# Patient Record
Sex: Male | Born: 1975
Health system: Southern US, Community
[De-identification: ages and names within clinical notes are randomized; demographics above are authoritative.]

## PROBLEM LIST (undated history)

## (undated) DIAGNOSIS — M1990 Unspecified osteoarthritis, unspecified site: Secondary | ICD-10-CM

## (undated) DIAGNOSIS — K219 Gastro-esophageal reflux disease without esophagitis: Secondary | ICD-10-CM

## (undated) DIAGNOSIS — R0600 Dyspnea, unspecified: Secondary | ICD-10-CM

## (undated) DIAGNOSIS — K859 Acute pancreatitis without necrosis or infection, unspecified: Secondary | ICD-10-CM

## (undated) DIAGNOSIS — J939 Pneumothorax, unspecified: Secondary | ICD-10-CM

## (undated) DIAGNOSIS — R569 Unspecified convulsions: Secondary | ICD-10-CM

## (undated) DIAGNOSIS — R519 Headache, unspecified: Secondary | ICD-10-CM

## (undated) HISTORY — PX: APPENDECTOMY: SHX54

## (undated) HISTORY — PX: CHOLECYSTECTOMY: SHX55

## (undated) NOTE — *Deleted (*Deleted)
Neurology consult  CC: seizure  History is obtained from:***  HPI: Ronald Butler is a 47 y.o. male ***   ROS: A complete ROS was performed and is negative except as noted in the HPI. *** Unable to assess due to altered mental status.   Past Medical History:  Diagnosis Date  . Pancreatitis   . Pneumothorax   . Seizures (HCC)     Family History  Family history unknown: Yes    Social History:  reports that he has been smoking cigarettes. He has been smoking about 2.00 packs per day. He has never used smokeless tobacco. He reports previous alcohol use. He reports previous drug use. Drug: Marijuana.   Prior to Admission medications   Medication Sig Start Date End Date Taking? Authorizing Provider  divalproex (DEPAKOTE ER) 500 MG 24 hr tablet Take 1 tablet (500 mg total) by mouth daily. 04/23/20  Yes Ronald Bucco, MD    Exam: Current vital signs: BP 127/83   Pulse 68   Temp 99.4 F (37.4 C) (Oral)   Resp (!) 25   SpO2 100%   Physical Exam  Constitutional: Appears well-developed and well-nourished.  Psych: Affect appropriate to situation Eyes: No scleral injection HENT: No OP obstrucion Head: Normocephalic.  Cardiovascular: Normal rate and regular rhythm.  Respiratory: Effort normal and breath sounds normal to anterior ascultation GI: Soft.  No distension. There is no tenderness.  Skin: WDI  Neuro: Mental Status: Patient is awake, alert, oriented to person, place, month, year, and situation.*** Patient is able to give a clear and coherent history.*** No signs of aphasia or neglect*** Cranial Nerves: II: Visual Fields are full. Pupils are equal, round, and reactive to light. *** III,IV, VI: EOMI without ptosis or diploplia.  V: Facial sensation is symmetric to temperature VII: Facial movement is symmetric.  VIII: hearing is intact to voice X: Uvula elevates symmetrically XI: Shoulder shrug is symmetric. XII: tongue is midline without atrophy or fasciculations.   Motor: Tone is normal. Bulk is normal. 5/5 strength was present in all four extremities. *** Sensory: Sensation is symmetric to light touch and temperature in the arms and legs. *** Deep Tendon Reflexes: 2+ and symmetric in the biceps and patellae. *** Plantars: Toes are downgoing bilaterally. *** Cerebellar: FNF and HKS are intact bilaterally. ***   I have reviewed labs in epic and the pertinent results are: *** I have reviewed the images obtained: NCT head showed *** CTA head and neck showed ***  Assessment: Ronald Butler is a 67 y.o. male PMHx ***   Impression: ***  Plan: - Serum VPA levels Results for Ronald Butler (MRN [id number]) as of 04/26/2020 19:41  Ref. Range 04/23/2020 23:00 04/26/2020 17:32  Valproic Acid,S Latest Ref Range: 50.0 - 100.0 ug/mL <10 (L) 23 (L)    - rEEG to eval for any epileptogenic discharges. - Recommend metabolic/infectious workup with CBC, CMP, UA with UCx, CXR, CK, serum lactate.   This patient is critically ill and at significant risk of neurological worsening, death and care requires constant monitoring of vital signs, hemodynamics,respiratory and cardiac monitoring, neurological assessment, discussion with family, other specialists and medical decision making of high complexity. I spent *** minutes of neurocritical care time  in the care of  this patient. This was time spent independent of any time provided by nurse practitioner or PA.  Electronically signed by: Dr. Marisue Humble Pager: [phone] 04/26/2020, 7:39 PM

---

## 2010-11-02 ENCOUNTER — Emergency Department (HOSPITAL_BASED_OUTPATIENT_CLINIC_OR_DEPARTMENT_OTHER)
Admission: EM | Admit: 2010-11-02 | Discharge: 2010-11-02 | Disposition: A | Discharge status: Home / Self Care | Payer: Self-pay | Attending: Emergency Medicine | Admitting: Emergency Medicine

## 2010-11-02 ENCOUNTER — Emergency Department (INDEPENDENT_AMBULATORY_CARE_PROVIDER_SITE_OTHER): Payer: Self-pay

## 2010-11-02 DIAGNOSIS — W19XXXA Unspecified fall, initial encounter: Secondary | ICD-10-CM | POA: Insufficient documentation

## 2010-11-02 DIAGNOSIS — S20219A Contusion of unspecified front wall of thorax, initial encounter: Secondary | ICD-10-CM | POA: Insufficient documentation

## 2010-11-02 DIAGNOSIS — R079 Chest pain, unspecified: Secondary | ICD-10-CM | POA: Insufficient documentation

## 2010-11-02 DIAGNOSIS — Y92009 Unspecified place in unspecified non-institutional (private) residence as the place of occurrence of the external cause: Secondary | ICD-10-CM | POA: Insufficient documentation

## 2016-08-31 ENCOUNTER — Emergency Department (HOSPITAL_COMMUNITY)
Admission: EM | Admit: 2016-08-31 | Discharge: 2016-08-31 | Disposition: A | Discharge status: Home / Self Care | Payer: Self-pay | Attending: Emergency Medicine | Admitting: Emergency Medicine

## 2016-08-31 ENCOUNTER — Encounter (HOSPITAL_COMMUNITY): Payer: Self-pay | Admitting: *Deleted

## 2016-08-31 ENCOUNTER — Emergency Department (HOSPITAL_COMMUNITY): Payer: Self-pay

## 2016-08-31 DIAGNOSIS — F17210 Nicotine dependence, cigarettes, uncomplicated: Secondary | ICD-10-CM | POA: Insufficient documentation

## 2016-08-31 DIAGNOSIS — M79642 Pain in left hand: Secondary | ICD-10-CM | POA: Insufficient documentation

## 2016-08-31 DIAGNOSIS — R21 Rash and other nonspecific skin eruption: Secondary | ICD-10-CM | POA: Insufficient documentation

## 2016-08-31 MED ORDER — DIPHENHYDRAMINE HCL 25 MG PO CAPS
50.0000 mg | ORAL_CAPSULE | Freq: Once | ORAL | Status: AC
  Administered 2016-08-31: 50 mg via ORAL
  Filled 2016-08-31: qty 2

## 2016-08-31 MED ORDER — KETOCONAZOLE 2 % EX CREA: 1.0000 "application " | TOPICAL_CREAM | Freq: Every day | CUTANEOUS | 0 refills | Status: DC

## 2016-08-31 MED ORDER — CEPHALEXIN 500 MG PO CAPS: 500.0000 mg | ORAL_CAPSULE | Freq: Four times a day (QID) | ORAL | 0 refills | Status: DC
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
## 2016-08-31 NOTE — ED Triage Notes (Addendum)
Pt c/o L hand pain onset yesterday, denies injury, pt reports noticing swelling, pt able to wiggle fingers, +pulse, A&O x4, skin intact, redness noted, A&O x4

## 2016-08-31 NOTE — Discharge Instructions (Signed)
Please with a splint for comfort. Motrin and Tylenol for pain and swelling. Please ice the area. Take antibiotics as prescribed. Use the cream on her underarms. Take Benadryl for itching. Follow back up into the 3 days for recheck.

## 2016-08-31 NOTE — ED Triage Notes (Signed)
PT came to desk because he was concerned about the rash located in each axilla. Pt felt the rash may be related to hand swelling. PA informed of PT rash to Bil. Axilla.

## 2016-08-31 NOTE — ED Triage Notes (Signed)
PT standing in door way and then walked to BR. Pt asking where the PA was. Pt was informed the PA knew about rash that was located in his axilla.

## 2016-08-31 NOTE — ED Notes (Signed)
Declined W/C at D/C and was escorted to lobby by RN. 

## 2016-09-03 ENCOUNTER — Encounter: Payer: Self-pay | Admitting: Pediatric Intensive Care

## 2016-09-03 MED FILL — KETOCONAZOLE 2% CREAM: 2 | 15 days supply | Qty: 15 | Fill #0

## 2016-09-03 MED FILL — CEPHALEXIN 500 MG CAPSULE: 500 | 5 days supply | Qty: 20 | Fill #0

## 2016-09-03 NOTE — Congregational Nurse Program (Signed)
Congregational Nurse Program Note  Date of Encounter: 09/02/2016  Past Medical History: No past medical history on file.  Encounter Details:     CNP Questionnaire - 09/02/16 0936      Patient Demographics   Is this a new or existing patient? New   Patient is considered a/an Not Applicable   Race Caucasian/White     Patient Assistance   Patient's financial/insurance status Low Income;Self-Pay (Uninsured)   Uninsured Patient ([place] Card/[employer] Connects) Yes   Interventions Assisted patient in making appt.;Averted from ED/Urgent Care   Patient referred to apply for the following financial assistance Not Applicable   Food insecurities addressed Not Applicable   Transportation assistance No   Assistance securing medications Yes   Type of Assistance [facility]   Educational health offerings Navigating the healthcare system;Acute disease     Encounter Details   Primary purpose of visit Acute Illness/Condition Visit;Post ED/Hospitalization Visit;Navigating the Healthcare System   Was an Emergency Department visit averted? Yes   Does patient have a medical provider? No   Patient referred to Clinic   Was a mental health screening completed? (GAINS tool) No   Does patient have dental issues? No   Does patient have vision issues? No   Does your patient have an abnormal blood pressure today? No   Since previous encounter, have you referred patient for abnormal blood pressure that resulted in a new diagnosis or medication change? No   Does your patient have an abnormal blood glucose today? No   Since previous encounter, have you referred patient for abnormal blood glucose that resulted in a new diagnosis or medication change? No   Was there a life-saving intervention made? No     Client [facility].  Needed assistance with getting prescriptions filled.  [facility] and delivered to client.  Client does not have a provider.   Assisted client with making appointment with [facility] and care

## 2016-09-14 ENCOUNTER — Encounter (HOSPITAL_COMMUNITY): Payer: Self-pay | Admitting: Emergency Medicine

## 2016-09-14 ENCOUNTER — Emergency Department (HOSPITAL_COMMUNITY)
Admission: EM | Admit: 2016-09-14 | Discharge: 2016-09-14 | Disposition: A | Discharge status: Home / Self Care | Payer: Self-pay | Attending: Emergency Medicine | Admitting: Emergency Medicine

## 2016-09-14 DIAGNOSIS — F17210 Nicotine dependence, cigarettes, uncomplicated: Secondary | ICD-10-CM | POA: Insufficient documentation

## 2016-09-14 DIAGNOSIS — R21 Rash and other nonspecific skin eruption: Secondary | ICD-10-CM | POA: Insufficient documentation

## 2016-09-14 DIAGNOSIS — Z79899 Other long term (current) drug therapy: Secondary | ICD-10-CM | POA: Insufficient documentation

## 2016-09-14 DIAGNOSIS — M25431 Effusion, right wrist: Secondary | ICD-10-CM | POA: Insufficient documentation

## 2016-09-14 DIAGNOSIS — M7989 Other specified soft tissue disorders: Secondary | ICD-10-CM

## 2016-09-14 DIAGNOSIS — M25432 Effusion, left wrist: Secondary | ICD-10-CM | POA: Insufficient documentation

## 2016-09-14 MED ORDER — KETOCONAZOLE 2 % EX CREA: 1.0000 "application " | TOPICAL_CREAM | Freq: Every day | CUTANEOUS | 0 refills | Status: DC

## 2016-09-14 MED ORDER — IBUPROFEN 400 MG PO TABS: 400.0000 mg | ORAL_TABLET | ORAL | 0 refills | Status: DC | PRN

## 2016-09-14 NOTE — ED Triage Notes (Signed)
When I returned to room to review the DC instructions with Ronald Butler , Ronald Butler had removed the Rt wrist splint . Ronald Butler walked out holding  wrist splint in LT hand.

## 2016-09-14 NOTE — ED Notes (Signed)
Declined W/C at D/C and was escorted to lobby by RN. 

## 2016-09-14 NOTE — ED Triage Notes (Signed)
Pt seen last weekend for rash located in axilla. Pt reports rash is worse.

## 2016-09-14 NOTE — ED Triage Notes (Signed)
Pt was seen here 1 week ago with left hand swelling hand is no better today and now his right hand is swelling. Pt has swelling in right hand and fingers. Radial pulses +2 equal bilaterally, sensation intact, cap refill less than 2. Pt also reports rash in groin and axilla. Pt was put on antibiotic and steroid cream with no relief.

## 2016-09-14 NOTE — Discharge Instructions (Signed)
Apply ketoconazole cream twice daily for 4 weeks, or until told otherwise by your primary care provider or dermatologist. Wear wrist splints at night to help with your hand pain and swelling. Take ibuprofen every 4-6 hours as prescribed over-the-counter for your hand pain and swelling. Please follow-up and establish care with a primary care provider as soon as possible for further evaluation and treatment of your symptoms. Please return the emergency Department if you develop any new or worsening symptoms including complete numbness or inability to move your hands, persistent color change, or any other concerning symptoms.
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
## 2016-10-01 MED FILL — IBUPROFEN 400 MG TABLET: 400 | 5 days supply | Qty: 30 | Fill #0

## 2016-10-01 MED FILL — KETOCONAZOLE 2% CREAM: 2 | 7 days supply | Qty: 30 | Fill #0

## 2016-10-05 NOTE — Congregational Nurse Program (Signed)
Congregational Nurse Program Note  Date of Encounter: 09/03/2016  Past Medical History: No past medical history on file.  Encounter Details: New client. Just left incarceration. States no major health issues. Complained of rash above right elbow and in axilla. Client has raised ertyhemic patchy areas under arms and above right elbow. Client states areas are itchy. Client has fungal cream. CN advised continue to apply cream to affected areas and ckeep clean and dry. CN advised follow up with [facility] clinic if rash does not resolve.

## 2016-12-17 ENCOUNTER — Encounter (HOSPITAL_BASED_OUTPATIENT_CLINIC_OR_DEPARTMENT_OTHER): Payer: Self-pay | Admitting: Emergency Medicine

## 2016-12-17 ENCOUNTER — Emergency Department (HOSPITAL_BASED_OUTPATIENT_CLINIC_OR_DEPARTMENT_OTHER)
Admission: EM | Admit: 2016-12-17 | Discharge: 2016-12-17 | Disposition: A | Discharge status: Home / Self Care | Payer: Self-pay | Attending: Physician Assistant | Admitting: Physician Assistant

## 2016-12-17 DIAGNOSIS — F17210 Nicotine dependence, cigarettes, uncomplicated: Secondary | ICD-10-CM | POA: Insufficient documentation

## 2016-12-17 DIAGNOSIS — Z791 Long term (current) use of non-steroidal anti-inflammatories (NSAID): Secondary | ICD-10-CM | POA: Insufficient documentation

## 2016-12-17 DIAGNOSIS — T1591XA Foreign body on external eye, part unspecified, right eye, initial encounter: Secondary | ICD-10-CM

## 2016-12-17 DIAGNOSIS — Y9389 Activity, other specified: Secondary | ICD-10-CM | POA: Insufficient documentation

## 2016-12-17 DIAGNOSIS — Y929 Unspecified place or not applicable: Secondary | ICD-10-CM | POA: Insufficient documentation

## 2016-12-17 DIAGNOSIS — X58XXXA Exposure to other specified factors, initial encounter: Secondary | ICD-10-CM | POA: Insufficient documentation

## 2016-12-17 DIAGNOSIS — Y998 Other external cause status: Secondary | ICD-10-CM | POA: Insufficient documentation

## 2016-12-17 DIAGNOSIS — Z79899 Other long term (current) drug therapy: Secondary | ICD-10-CM | POA: Insufficient documentation

## 2016-12-17 DIAGNOSIS — T1501XA Foreign body in cornea, right eye, initial encounter: Secondary | ICD-10-CM | POA: Insufficient documentation

## 2016-12-17 DIAGNOSIS — Z9049 Acquired absence of other specified parts of digestive tract: Secondary | ICD-10-CM | POA: Insufficient documentation

## 2016-12-17 MED ORDER — ERYTHROMYCIN 5 MG/GM OP OINT
TOPICAL_OINTMENT | OPHTHALMIC | Status: AC
  Filled 2016-12-17: qty 3.5

## 2016-12-17 MED ORDER — FLUORESCEIN SODIUM 0.6 MG OP STRP
1.0000 | ORAL_STRIP | Freq: Once | OPHTHALMIC | Status: AC
  Administered 2016-12-17: 1 via OPHTHALMIC
  Filled 2016-12-17: qty 1

## 2016-12-17 MED ORDER — ERYTHROMYCIN 5 MG/GM OP OINT
TOPICAL_OINTMENT | Freq: Once | OPHTHALMIC | Status: AC
  Administered 2016-12-17: 20:00:00 via OPHTHALMIC

## 2016-12-17 MED ORDER — TETRACAINE HCL 0.5 % OP SOLN
1.0000 [drp] | Freq: Once | OPHTHALMIC | Status: AC
Start: 2016-12-17 — End: 2016-12-17
  Administered 2016-12-17: 1 [drp] via OPHTHALMIC
  Filled 2016-12-17: qty 4

## 2016-12-17 MED ORDER — ACETAMINOPHEN 500 MG PO TABS
1000.0000 mg | ORAL_TABLET | Freq: Once | ORAL | Status: AC
  Administered 2016-12-17: 1000 mg via ORAL
  Filled 2016-12-17: qty 2

## 2016-12-17 MED ORDER — ERYTHROMYCIN 5 MG/GM OP OINT: TOPICAL_OINTMENT | OPHTHALMIC | 0 refills | Status: DC

## 2016-12-17 NOTE — ED Notes (Addendum)
While ambulating to assess pt visual acuity, pt reported feeling like the room was spinning. Pt was placed in a wheelchair. Pt reported feeling like he was "getting hot and that this nurse and his fiance were moving in circles". 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 Orthostatics were taken and MD notified.

## 2016-12-17 NOTE — ED Provider Notes (Signed)
[facility] Provider Note   CSN: [id number] Arrival date & time: 12/17/16  1752  By signing my name below, I, Vista Mink, attest that this documentation has been prepared under the direction and in the presence of [facility] PA-C.  Electronically Signed: Vista Mink, ED Scribe. 12/17/16. 7:55 PM.  History   Chief Complaint Chief Complaint  Patient presents with  . Eye Pain    HPI HPI Comments: Ronald Butler is a 41 y.o. male who presents to the Emergency Department complaining of constant right eye irritation that started earlier today. 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 He returned home from sanding the metal door and started to notice irritation in his right eye. He believes he may have gotten shavings from the door into his eye. He reports mild blurry vision out of the right eye but no significant loss of vision. Also endorses mild tearing. Pt's pain to his right eye is exacerbated when moving the eye and when blinking. He notes an area overlying his iris where he believes there is a foreign body. He used eye drops at home and tried to flush out the eye with no relief. He does not wear contacts. He denies any fever, chills, photophobia, eye drainage. On arrival here, pt had a 3 minute episode of lightheadedness, confusion and overall body warmth which resolved without treatment. Pt believes it may have been due to anxiety or dehydration as he works outside all day and has not been drinking water like he should.   The history is provided by the patient. No language interpreter was used.    History reviewed. No pertinent past medical history.  There are no active problems to display for this patient.   Past Surgical History:  Procedure Laterality Date  . APPENDECTOMY    . CHOLECYSTECTOMY         Home Medications    Prior to Admission medications   Medication Sig Start Date End Date Taking? Authorizing  Provider  cephALEXin (KEFLEX) 500 MG capsule Take 1 capsule (500 mg total) by mouth 4 (four) times daily. 08/31/16   Rise Mu, PA-C  erythromycin ophthalmic ointment Place a 1/2 inch ribbon of ointment into the lower eyelid 4 times daily for 5 days 12/17/16   Michela Pitcher A, PA-C  ibuprofen (ADVIL,MOTRIN) 400 MG tablet Take 1 tablet (400 mg total) by mouth every 4 (four) hours as needed. 09/14/16   Law, Waylan Boga, PA-C  ketoconazole (NIZORAL) 2 % cream Apply 1 application topically daily. 09/14/16   Emi Holes, PA-C    Family History No family history on file.  Social History Social History  Substance Use Topics  . Smoking status: Current Every Day Smoker    Packs/day: 0.50    Types: Cigarettes  . Smokeless tobacco: Never Used  . Alcohol use Yes     Comment: occassional     Allergies   Patient has no known allergies.   Review of Systems Review of Systems  Constitutional: Negative for chills and fever.  Eyes: Positive for pain, itching and visual disturbance (slight blurry vision). Negative for photophobia and discharge.  Neurological: Positive for light-headedness (resolved).  All other systems reviewed and are negative.    Physical Exam Updated Vital Signs BP 129/75   Pulse 77   Temp 98.5 F (36.9 C) (Oral)   Resp 17   Ht 5\' 10"  (1.778 m)   Wt 74.8 kg (165 lb)  SpO2 100%   BMI 23.68 kg/m   Physical Exam  Constitutional: He is oriented to person, place, and time. He appears well-developed and well-nourished.  HENT:  Head: Normocephalic and atraumatic.  Eyes: Conjunctivae and EOM are normal. Pupils are equal, round, and reactive to light. Right eye exhibits no discharge. Left eye exhibits no discharge. No scleral icterus.  No edema or erythema or tenderness of the eyelids or periorbital region. No significant conjunctival injection, no chemosis. No consensual photophobia. There is a white fleck to the left upper iris of the right eye. Normal EOMs,  patient states pain with movement "when I move the eyelid over the spot".   On fluorescein stain, no corneal abrasions or ulcerations, dendritic lesions, rust rings, or other foreign bodies noted.    Visual Acuity  Right Eye Distance: 20/30 Left Eye Distance: 20/20     Neck: No JVD present. No tracheal deviation present.  Cardiovascular: Normal rate.   Pulmonary/Chest: Effort normal.  Abdominal: He exhibits no distension.  Musculoskeletal: He exhibits no edema.  5/5 strength of bilateral upper extremities and bilateral lower extremities  Neurological: He is alert and oriented to person, place, and time. No cranial nerve deficit or sensory deficit.  Fluent speech, no facial droop, sensation intact to soft touch globally, negative Romberg, negative pronator drift. No nystagmus. Normal gait.   Skin: Skin is warm and dry. Capillary refill takes less than 2 seconds.     ED Treatments / Results  DIAGNOSTIC STUDIES: Oxygen Saturation is 100% on RA, normal by my interpretation.  COORDINATION OF CARE: 6:43 PM-Discussed treatment plan with pt at bedside and pt agreed to plan.   Labs (all labs ordered are listed, but only abnormal results are displayed) Labs Reviewed - No data to display  EKG  EKG Interpretation None       Radiology No results found.  Procedures .Foreign Body Removal Date/Time: 12/17/2016 7:37 PM Performed by: Michela Pitcher A Authorized by: Michela Pitcher A  Consent: Verbal consent obtained. Consent given by: patient Patient understanding: patient states understanding of the procedure being performed Patient consent: the patient's understanding of the procedure matches consent given Imaging studies: imaging studies not available Patient identity confirmed: verbally with patient Body area: eye Location details: right cornea Anesthesia: local infiltration  Anesthesia: Local Anesthetic: tetracaine drops Patient restrained: no Patient cooperative:  yes Localization method: visualized Removal mechanism: moist cotton swab Eye examined with fluorescein. No residual rust ring present. Dressing: antibiotic ointment Depth: superficial Complexity: simple 1 objects recovered. Objects recovered: paint fleck Post-procedure assessment: foreign body removed Patient tolerance: Patient tolerated the procedure well with no immediate complications   (including critical care time)  Medications Ordered in ED Medications  tetracaine (PONTOCAINE) 0.5 % ophthalmic solution 1 drop (1 drop Right Eye Given 12/17/16 1825)  fluorescein ophthalmic strip 1 strip (1 strip Right Eye Given 12/17/16 1825)  acetaminophen (TYLENOL) tablet 1,000 mg (1,000 mg Oral Given 12/17/16 1919)  erythromycin ophthalmic ointment ( Right Eye Given 12/17/16 1944)     Initial Impression / Assessment and Plan / ED Course  I have reviewed the triage vital signs and the nursing notes.  Pertinent labs & imaging results that were available during my care of the patient were reviewed by me and considered in my medical decision making (see chart for details).     Patient with foreign body to right eye, likely a paint fleck. No signs or symptoms of periorbital or preseptal cellulitis, nerve entrapment, glaucoma, or conjunctivitis. No evidence  of rust ring, HSV, or corneal abrasion. Foreign body removed successfully with resolution of symptoms. Patient had a brief episode of lightheadedness, no focal neurological deficits on examination. Suspect possible vasovagal reaction or dehydration. Low suspicion of TIA, stroke, or other intracranial abnormality. Will treat with erythromycin ointment. Discussed indications for return to the ED. Recommend follow-up with ophthalmologist if symptoms worsen or persist. Pt verbalized understanding of and agreement with plan and is safe for discharge home at this time.   Final Clinical Impressions(s) / ED Diagnoses   Final diagnoses:  Foreign body of  right eye, initial encounter    New Prescriptions New Prescriptions   ERYTHROMYCIN OPHTHALMIC OINTMENT    Place a 1/2 inch ribbon of ointment into the lower eyelid 4 times daily for 5 days  I personally performed the services described in this documentation, which was scribed in my presence. The recorded information has been reviewed and is accurate.    Jeanie SewerFawze, Hartley Wyke A, PA-C 12/17/16 1955    Abelino DerrickMackuen, Courteney Lyn, MD 12/17/16 2337

## 2016-12-17 NOTE — ED Triage Notes (Signed)
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

## 2016-12-17 NOTE — ED Notes (Addendum)
Pt c/o HA at this time and is requesting APAP for same. EDP notified see new orders.

## 2016-12-17 NOTE — ED Notes (Signed)
Pt given crackers and water. 

## 2016-12-17 NOTE — Discharge Instructions (Signed)
Take antibiotics to completion as directed. You may take ibuprofen or Tylenol as needed for pain. You may use eyedrops as needed for hydration and itching. He may use ice packs or warm compresses to the area for comfort. Follow-up with an ophthalmologist if symptoms persist or worsen. Return to the ED if any concerning signs or symptoms develop.

## 2016-12-17 NOTE — ED Notes (Signed)
Woods lamp to bedside 

## 2017-04-23 ENCOUNTER — Emergency Department (HOSPITAL_COMMUNITY): Payer: Self-pay

## 2017-04-23 ENCOUNTER — Other Ambulatory Visit: Payer: Self-pay | Admitting: *Deleted

## 2017-04-23 ENCOUNTER — Emergency Department (HOSPITAL_COMMUNITY)
Admission: EM | Admit: 2017-04-23 | Discharge: 2017-04-23 | Disposition: A | Discharge status: Home / Self Care | Payer: Self-pay | Attending: Emergency Medicine | Admitting: Emergency Medicine

## 2017-04-23 ENCOUNTER — Encounter (HOSPITAL_COMMUNITY): Payer: Self-pay | Admitting: Emergency Medicine

## 2017-04-23 DIAGNOSIS — J939 Pneumothorax, unspecified: Secondary | ICD-10-CM

## 2017-04-23 DIAGNOSIS — F17210 Nicotine dependence, cigarettes, uncomplicated: Secondary | ICD-10-CM | POA: Insufficient documentation

## 2017-04-23 DIAGNOSIS — J9383 Other pneumothorax: Secondary | ICD-10-CM

## 2017-04-23 LAB — COMPREHENSIVE METABOLIC PANEL
ALBUMIN: 5.3 g/dL — AB (ref 3.5–5.0)
ALT: 19 U/L (ref 17–63)
AST: 25 U/L (ref 15–41)
Alkaline Phosphatase: 84 U/L (ref 38–126)
Anion gap: 14 (ref 5–15)
BILIRUBIN TOTAL: 0.6 mg/dL (ref 0.3–1.2)
BUN: 11 mg/dL (ref 6–20)
CHLORIDE: 103 mmol/L (ref 101–111)
CO2: 22 mmol/L (ref 22–32)
CREATININE: 1.15 mg/dL (ref 0.61–1.24)
Calcium: 10.8 mg/dL — ABNORMAL HIGH (ref 8.9–10.3)
GFR calc Af Amer: >60 mL/min (ref >60)
GFR calc non Af Amer: >60 mL/min (ref >60)
GLUCOSE: 99 mg/dL (ref 65–99)
Potassium: 4.2 mmol/L (ref 3.5–5.1)
Sodium: 139 mmol/L (ref 135–145)
Total Protein: 8.8 g/dL — ABNORMAL HIGH (ref 6.5–8.1)

## 2017-04-23 LAB — CBC
HEMATOCRIT: 45.3 % (ref 39.0–52.0)
HEMOGLOBIN: 15.7 g/dL (ref 13.0–17.0)
MCH: 31.3 pg (ref 26.0–34.0)
MCHC: 34.7 g/dL (ref 30.0–36.0)
MCV: 90.2 fL (ref 78.0–100.0)
Platelets: 286 10*3/uL (ref 150–400)
RBC: 5.02 MIL/uL (ref 4.22–5.81)
RDW: 12.6 % (ref 11.5–15.5)
WBC: 9.3 10*3/uL (ref 4.0–10.5)

## 2017-04-23 LAB — I-STAT TROPONIN, ED: Troponin i, poc: 0 ng/mL (ref 0.00–0.08)

## 2017-04-23 LAB — D-DIMER, QUANTITATIVE: D-Dimer, Quant: 0.83 ug/mL-FEU — ABNORMAL HIGH (ref 0.00–0.50)

## 2017-04-23 MED ORDER — OXYCODONE-ACETAMINOPHEN 5-325 MG PO TABS
1.0000 | ORAL_TABLET | Freq: Once | ORAL | Status: AC
  Administered 2017-04-23: 1 via ORAL
  Filled 2017-04-23: qty 1

## 2017-04-23 MED ORDER — HYDROMORPHONE HCL 1 MG/ML IJ SOLN
1.0000 mg | Freq: Once | INTRAMUSCULAR | Status: AC
  Administered 2017-04-23: 1 mg via INTRAVENOUS
  Filled 2017-04-23: qty 1

## 2017-04-23 NOTE — ED Provider Notes (Signed)
[facility]-EMERGENCY DEPT Provider Note   CSN: [id number] Arrival date & time: 04/23/17  0804     History   Chief Complaint Chief Complaint  Patient presents with  . Shortness of Breath    HPI Ronald Butler is a 41 y.o. male.  HPI Patient is a [age] year old male with a history of spontaneous pneumothorax 10 months ago presents the emergency department acute onset right-sided chest discomfort shortness of breath.  He states this feels similar to his prior pneumothorax.  His prior pneumothorax was treated conservatively with oxygen.  He never required chest tube.  [facility]  He is never had a prior pneumothorax.  He smokes cigarettes.  No cough or congestion.  No other symptoms.  Pain is moderate to severe in severity.   History reviewed. No pertinent past medical history.  There are no active problems to display for this patient.   Past Surgical History:  Procedure Laterality Date  . APPENDECTOMY    . CHOLECYSTECTOMY         Home Medications    Prior to Admission medications   Medication Sig Start Date End Date Taking? Authorizing Provider  cephALEXin (KEFLEX) 500 MG capsule Take 1 capsule (500 mg total) by mouth 4 (four) times daily. 08/31/16   Rise MuLeaphart, Kenneth T, PA-C  erythromycin ophthalmic ointment Place a 1/2 inch ribbon of ointment into the lower eyelid 4 times daily for 5 days 12/17/16   Michela PitcherFawze, Mina A, PA-C  ibuprofen (ADVIL,MOTRIN) 400 MG tablet Take 1 tablet (400 mg total) by mouth every 4 (four) hours as needed. 09/14/16   Law, Waylan BogaAlexandra M, PA-C  ketoconazole (NIZORAL) 2 % cream Apply 1 application topically daily. 09/14/16   Emi HolesLaw, Alexandra M, PA-C    Family History No family history on file.  Social History Social History  Substance Use Topics  . Smoking status: Current Every Day Smoker    Packs/day: 0.50    Types: Cigarettes  . Smokeless tobacco: Never Used  . Alcohol use Yes     Comment:  occassional     Allergies   Patient has no known allergies.   Review of Systems Review of Systems  All other systems reviewed and are negative.    Physical Exam Updated Vital Signs BP 118/89   Pulse 64   Temp 97.9 F (36.6 C) (Oral)   Resp 17   Ht 5\' 10"  (1.778 m)   Wt 74.8 kg (165 lb)   SpO2 100%   BMI 23.68 kg/m   Physical Exam  Constitutional: He is oriented to person, place, and time. He appears well-developed and well-nourished.  HENT:  Head: Normocephalic and atraumatic.  Eyes: EOM are normal.  Neck: Normal range of motion.  Cardiovascular: Normal heart sounds.   Pulmonary/Chest: Effort normal and breath sounds normal. No respiratory distress.  Abdominal: Soft. He exhibits no distension. There is no tenderness.  Musculoskeletal: Normal range of motion.  Neurological: He is alert and oriented to person, place, and time.  Skin: Skin is warm and dry.  Psychiatric: He has a normal mood and affect. Judgment normal.  Nursing note and vitals reviewed.    ED Treatments / Results  Labs (all labs ordered are listed, but only abnormal results are displayed) Labs Reviewed  COMPREHENSIVE METABOLIC PANEL - Abnormal; Notable for the following:       Result Value   Calcium 10.8 (*)    Total Protein 8.8 (*)    Albumin 5.3 (*)  All other components within normal limits  D-DIMER, QUANTITATIVE (NOT AT [facility]) - Abnormal; Notable for the following:    D-Dimer, Quant 0.83 (*)    All other components within normal limits  CBC  I-STAT TROPONIN, ED    EKG  EKG Interpretation  Date/Time:  Wednesday April 23 2017 08:08:36 EDT Ventricular Rate:  106 PR Interval:    QRS Duration: 123 QT Interval:  361 QTC Calculation: 482 R Axis:   110 Text Interpretation:  Sinus tachycardia Nonspecific intraventricular conduction delay Probable lateral infarct, old Artifact in lead(s) I II III aVR aVL aVF V2 V3 V4 V5 V6 and baseline wander in lead(s) V3 No old tracing to compare  Confirmed by Azalia Bilis ([id number]) on 04/23/2017 8:19:31 AM       Radiology Dg Chest Portable 1 View  Result Date: 04/23/2017 CLINICAL DATA:  Severe right chest pain, shortness of breath, history of pneumothorax EXAM: PORTABLE CHEST 1 VIEW COMPARISON:  07/20/2016 FINDINGS: Trace right apical pneumothorax faintly visualized, estimated less than 10%. No midline mediastinal shift. Lungs remain clear. Normal heart size and vascularity. Trachea is midline. Left lung is clear. IMPRESSION: Trace right apical pneumothorax. These results were called by telephone at the time of interpretation on 04/23/2017 at 8:26 am to Dr. Azalia Bilis , who verbally acknowledged these results. Electronically Signed   By: Judie Petit.  Shick M.D.   On: 04/23/2017 08:27    Procedures Procedures (including critical care time)  Medications Ordered in ED Medications  HYDROmorphone (DILAUDID) injection 1 mg (1 mg Intravenous Given 04/23/17 0824)     Initial Impression / Assessment and Plan / ED Course  I have reviewed the triage vital signs and the nursing notes.  Pertinent labs & imaging results that were available during my care of the patient were reviewed by me and considered in my medical decision making (see chart for details).     Patient was placed on nonrebreather mask.  Chest x-Kenly consistent with a small less than 10% pneumothorax.  Given his history of recurrent pneumothorax he would likely benefit from CT surgery follow-up.  Case was discussed with Dr. Edwin Cap cardiothoracic surgery team.  Dr. Virgel Gess personally reviewed his x-Niccolo.  He will see the patient in the office tomorrow.  Patient remained on nonrebreather mask in the emergency department.  He was then taken off.  His pain is controlled.  He ambulated in the ER without difficulty and no hypoxia.  Patient encouraged to return to the ER for any new or worsening symptoms.  He understands the importance of close follow-up with the cardiothoracic surgery team  tomorrow.  As he may benefit from pleurodesis for recurrent pneumothorax  Final Clinical Impressions(s) / ED Diagnoses   Final diagnoses:  Pneumothorax, acute    New Prescriptions New Prescriptions   No medications on file     Azalia Bilis, MD 04/23/17 1656

## 2017-04-23 NOTE — ED Notes (Signed)
Patient ambulated in hall without complications. Did c/o of some discomfort to chest and back. Denies shortness of breath. Pulse ox monitored during ambulation maintained at 99-100% on RA. NRB was discontinued at 1037.

## 2017-04-23 NOTE — ED Triage Notes (Signed)
Pt complaint of severe onset SOB to right side of chest with awakening this am around 0600; hx of "collapsed lung." Recent cough this past week.

## 2017-04-24 ENCOUNTER — Inpatient Hospital Stay (HOSPITAL_COMMUNITY)
Admission: AD | Admit: 2017-04-24 | Discharge: 2017-04-24 | DRG: 201 | Discharge status: Against Medical Advice | Payer: Self-pay | Source: Ambulatory Visit | Attending: Surgery | Admitting: Surgery

## 2017-04-24 ENCOUNTER — Encounter: Payer: Self-pay | Admitting: Surgery

## 2017-04-24 ENCOUNTER — Encounter (HOSPITAL_COMMUNITY): Payer: Self-pay

## 2017-04-24 ENCOUNTER — Ambulatory Visit (INDEPENDENT_AMBULATORY_CARE_PROVIDER_SITE_OTHER): Payer: Self-pay | Admitting: Surgery

## 2017-04-24 ENCOUNTER — Ambulatory Visit
Admission: RE | Admit: 2017-04-24 | Discharge: 2017-04-24 | Disposition: A | Discharge status: Home / Self Care | Payer: Self-pay | Source: Ambulatory Visit | Attending: Surgery | Admitting: Surgery

## 2017-04-24 VITALS — BP 105/70 | HR 86 | Temp 97.6°F | Ht 70.0 in | Wt 160.0 lb

## 2017-04-24 DIAGNOSIS — J9383 Other pneumothorax: Principal | ICD-10-CM | POA: Diagnosis present

## 2017-04-24 DIAGNOSIS — J939 Pneumothorax, unspecified: Secondary | ICD-10-CM

## 2017-04-24 DIAGNOSIS — F17210 Nicotine dependence, cigarettes, uncomplicated: Secondary | ICD-10-CM | POA: Diagnosis present

## 2017-04-24 LAB — CBC
HEMATOCRIT: 38 % — AB (ref 39.0–52.0)
Hemoglobin: 12.9 g/dL — ABNORMAL LOW (ref 13.0–17.0)
MCH: 31.2 pg (ref 26.0–34.0)
MCHC: 33.9 g/dL (ref 30.0–36.0)
MCV: 91.8 fL (ref 78.0–100.0)
PLATELETS: 231 10*3/uL (ref 150–400)
RBC: 4.14 MIL/uL — ABNORMAL LOW (ref 4.22–5.81)
RDW: 12.5 % (ref 11.5–15.5)
WBC: 8.6 10*3/uL (ref 4.0–10.5)

## 2017-04-24 LAB — BASIC METABOLIC PANEL
Anion gap: 8 (ref 5–15)
BUN: 13 mg/dL (ref 6–20)
CALCIUM: 8.9 mg/dL (ref 8.9–10.3)
CO2: 27 mmol/L (ref 22–32)
CREATININE: 1.01 mg/dL (ref 0.61–1.24)
Chloride: 102 mmol/L (ref 101–111)
GFR calc Af Amer: >60 mL/min (ref >60)
Glucose, Bld: 88 mg/dL (ref 65–99)
POTASSIUM: 3.6 mmol/L (ref 3.5–5.1)
SODIUM: 137 mmol/L (ref 135–145)

## 2017-04-24 MED ORDER — ONDANSETRON HCL 4 MG PO TABS: 4.0000 mg | ORAL_TABLET | Freq: Four times a day (QID) | ORAL | Status: DC | PRN

## 2017-04-24 MED ORDER — ACETAMINOPHEN 325 MG PO TABS: 650.0000 mg | ORAL_TABLET | Freq: Four times a day (QID) | ORAL | Status: DC | PRN

## 2017-04-24 MED ORDER — SODIUM CHLORIDE 0.9 % IV SOLN: 250.0000 mL | INTRAVENOUS | Status: DC | PRN

## 2017-04-24 MED ORDER — ACETAMINOPHEN 650 MG RE SUPP: 650.0000 mg | Freq: Four times a day (QID) | RECTAL | Status: DC | PRN

## 2017-04-24 MED ORDER — SODIUM CHLORIDE 0.9% FLUSH: 3.0000 mL | INTRAVENOUS | Status: DC | PRN

## 2017-04-24 MED ORDER — OXYCODONE HCL 5 MG PO TABS
5.0000 mg | ORAL_TABLET | ORAL | Status: DC | PRN
  Administered 2017-04-24: 5 mg via ORAL
  Filled 2017-04-24: qty 1

## 2017-04-24 MED ORDER — ONDANSETRON HCL 4 MG/2ML IJ SOLN: 4.0000 mg | Freq: Four times a day (QID) | INTRAMUSCULAR | Status: DC | PRN

## 2017-04-24 MED ORDER — KETOROLAC TROMETHAMINE 15 MG/ML IJ SOLN
15.0000 mg | Freq: Four times a day (QID) | INTRAMUSCULAR | Status: DC | PRN
  Administered 2017-04-24: 15 mg via INTRAVENOUS
  Filled 2017-04-24: qty 1

## 2017-04-24 MED ORDER — CEFUROXIME SODIUM 1.5 G IV SOLR
1.5000 g | INTRAVENOUS | Status: DC
  Filled 2017-04-24: qty 1.5

## 2017-04-24 MED ORDER — SODIUM CHLORIDE 0.9% FLUSH: 3.0000 mL | Freq: Two times a day (BID) | INTRAVENOUS | Status: DC

## 2017-04-24 NOTE — Progress Notes (Signed)
Pts family member asked to borrow a wheelchair to wheel pt around unit. Pt and family educated on need to stay on the unit and not go past the elevators. Pt and family member left the unit, and returned 7570m later. Pt and family re-educated that the pt must remain on the floor. Will continue to monitor.

## 2017-04-24 NOTE — H&P (Signed)
[address]       Ronald Butler [address]             [phone]      Cardiothoracic Surgery Admission History and Physical   PCP is Patient, No Pcp Per Referring Provider is Lorre Nick, MD      Chief Complaint  Patient presents with  . Pain    s/p pnemothorax 04/23/17 with  CXR  . Spontaneous Pneumothorax    HPI:  The patient is a 41 year old smoker who had a right spontaneous ptx in Feb 2018 that was treated in [facility] conservatively without a chest tube. He now presented to [facility] yesterday morning after awakening with right sided chest pain and shortness of breath. A CXR showed a small right apical ptx of less than 10%. He was discharged from the ER and sent to my office today. He says that his pain has worsened since yesterday and it is hard to breath or move. He is here with a friend who drove him. He continues to smoke 1 ppd.  History reviewed. No pertinent past medical history.       Past Surgical History:  Procedure Laterality Date  . APPENDECTOMY    . CHOLECYSTECTOMY      History reviewed. No pertinent family history.  Social History        Social History  Substance Use Topics  . Smoking status: Current Every Day Smoker    Packs/day: 0.50    Types: Cigarettes  . Smokeless tobacco: Never Used  . Alcohol use Yes      Comment: occassional    No current outpatient prescriptions on file.   No current facility-administered medications for this visit.     No Known Allergies  Review of Systems  Constitutional: Negative for chills and fever.  HENT: Negative.   Eyes: Negative.   Respiratory: Positive for cough, chest tightness and shortness of breath.   Cardiovascular: Positive for chest pain.  Gastrointestinal: Negative.   Endocrine: Negative.   Genitourinary: Negative.   Musculoskeletal: Negative.   Skin: Negative.   Allergic/Immunologic: Negative.   Neurological: Negative.     Hematological: Negative.   Psychiatric/Behavioral: Negative.     BP 105/70   Pulse 86   Temp 97.6 F (36.4 C) (Oral)   Ht 5\' 10"  (1.778 m)   Wt 160 lb (72.6 kg)   SpO2 99%   BMI 22.96 kg/m  Physical Exam  Constitutional: He is oriented to person, place, and time. He appears well-developed and well-nourished. He appears distressed.  HENT:  Head: Normocephalic and atraumatic.  Mouth/Throat: Oropharynx is clear and moist.  Eyes: Pupils are equal, round, and reactive to light. Conjunctivae and EOM are normal.  Neck: Normal range of motion. Neck supple. No JVD present. No thyromegaly present.  Cardiovascular: Normal rate, regular rhythm, normal heart sounds and intact distal pulses.   No murmur heard. Pulmonary/Chest: He is in respiratory distress. He exhibits no tenderness.  Decreased breath sounds on the right  Abdominal: Soft. Bowel sounds are normal. He exhibits no distension and no mass. There is no tenderness.  Musculoskeletal: Normal range of motion. He exhibits no edema.  Lymphadenopathy:    He has no cervical adenopathy.  Neurological: He is alert and oriented to person, place, and time.  Skin: Skin is warm and dry.  Psychiatric: He has a normal mood and affect.     Diagnostic Tests:  CLINICAL DATA: Right pneumothorax.  EXAM:  CHEST 2 VIEW  COMPARISON: Chest x-Sidi from yesterday.  FINDINGS: The cardiomediastinal silhouette is normal in size. Normal pulmonary vascularity. Enlarging small right pneumothorax, now approximately 20%. No mediastinal shift. The lungs are clear. No pleural effusion. No acute osseous abnormality.  IMPRESSION: Enlarging small right pneumothorax, now approximately 20%.  These results will be called to the ordering clinician or representative by the Radiologist Assistant, and communication documented in the PACS or zVision Dashboard.   Electronically Signed By: William T Derry M.D. On: 1Obie Dredge0/25/2018  13:41   Impression:  This is is second episode of spontaneous right ptx and it is enlarged since yesterday. He is very uncomfortable. I think the best option is to do a right VATS with bleb stapling and mechanical pleurodesis in the OR tomorrow. I discussed the surgical procedure, alternative of chest tube or conservative observation, benefits and risks including but not limited to bleeding, infection, injury to the lung, prolonged air leak and recurrent ptx and he understands and agrees to proceed.  Plan:  He will be admitted from the office today for observation and pain control and plan right VATS tomorrow am.   Alleen BorneBryan K Bartle, MD [facility] [phone]

## 2017-04-24 NOTE — Progress Notes (Signed)
Pt refusing to stay on the floor, explained to him that we cannot monitor him or provide him care if he is off the unit and something happens, pt still refusing to stay on floor, he wanted to sign himself out against medical advice, Dr [name] was informed of the patients decision.

## 2017-04-24 NOTE — Progress Notes (Signed)
Cardiothoracic Surgery Consultation   PCP is Patient, No Pcp Per Referring Provider is Lorre NickAllen, Anthony, MD  Chief Complaint  Patient presents with  . Pain    s/p pnemothorax 04/23/17 with  CXR  . Spontaneous Pneumothorax    HPI:  The patient is a 41 year old smoker who had a right spontaneous ptx in Feb 2018 that was treated in [facility] conservatively without a chest tube. He now presented to [facility] yesterday morning after awakening with right sided chest pain and shortness of breath. A CXR showed a small right apical ptx of less than 10%. He was discharged from the ER and sent to my office today. He says that his pain has worsened since yesterday and it is hard to breath or move. He is here with a friend who drove him. He continues to smoke 1 ppd.  History reviewed. No pertinent past medical history.  Past Surgical History:  Procedure Laterality Date  . APPENDECTOMY    . CHOLECYSTECTOMY      History reviewed. No pertinent family history.  Social History Social History  Substance Use Topics  . Smoking status: Current Every Day Smoker    Packs/day: 0.50    Types: Cigarettes  . Smokeless tobacco: Never Used  . Alcohol use Yes     Comment: occassional    No current outpatient prescriptions on file.   No current facility-administered medications for this visit.     No Known Allergies  Review of Systems  Constitutional: Negative for chills and fever.  HENT: Negative.   Eyes: Negative.   Respiratory: Positive for cough, chest tightness and shortness of breath.   Cardiovascular: Positive for chest pain.  Gastrointestinal: Negative.   Endocrine: Negative.   Genitourinary: Negative.   Musculoskeletal: Negative.   Skin: Negative.   Allergic/Immunologic: Negative.   Neurological: Negative.   Hematological: Negative.   Psychiatric/Behavioral: Negative.     BP 105/70   Pulse 86   Temp 97.6 F (36.4 C) (Oral)   Ht 5\' 10"  (1.778 m)   Wt 160 lb (72.6  kg)   SpO2 99%   BMI 22.96 kg/m  Physical Exam  Constitutional: He is oriented to person, place, and time. He appears well-developed and well-nourished. He appears distressed.  HENT:  Head: Normocephalic and atraumatic.  Mouth/Throat: Oropharynx is clear and moist.  Eyes: Pupils are equal, round, and reactive to light. Conjunctivae and EOM are normal.  Neck: Normal range of motion. Neck supple. No JVD present. No thyromegaly present.  Cardiovascular: Normal rate, regular rhythm, normal heart sounds and intact distal pulses.   No murmur heard. Pulmonary/Chest: He is in respiratory distress. He exhibits no tenderness.  Decreased breath sounds on the right  Abdominal: Soft. Bowel sounds are normal. He exhibits no distension and no mass. There is no tenderness.  Musculoskeletal: Normal range of motion. He exhibits no edema.  Lymphadenopathy:    He has no cervical adenopathy.  Neurological: He is alert and oriented to person, place, and time.  Skin: Skin is warm and dry.  Psychiatric: He has a normal mood and affect.     Diagnostic Tests:  CLINICAL DATA:  Right pneumothorax.  EXAM: CHEST  2 VIEW  COMPARISON:  Chest x-Tyre from yesterday.  FINDINGS: The cardiomediastinal silhouette is normal in size. Normal pulmonary vascularity. Enlarging small right pneumothorax, now approximately 20%. No mediastinal shift. The lungs are clear. No pleural effusion. No acute osseous abnormality.  IMPRESSION: Enlarging small right pneumothorax, now approximately 20%.  These results will be called to the ordering clinician or representative by the Radiologist Assistant, and communication documented in the PACS or zVision Dashboard.   Electronically Signed   By: Obie Dredge M.D.   On: 04/24/2017 13:41   Impression:  This is is second episode of spontaneous right ptx and it is enlarged since yesterday. He is very uncomfortable. I think the best option is to do a right VATS  with bleb stapling and mechanical pleurodesis in the OR tomorrow. I discussed the surgical procedure, alternative of chest tube or conservative observation, benefits and risks including but not limited to bleeding, infection, injury to the lung, prolonged air leak and recurrent ptx and he understands and agrees to proceed.  Plan:  He will be admitted from the office today for observation and pain control and plan right VATS tomorrow am.   I spent 20 minutes performing this consultation and > 50% of this time was spent face to face counseling and coordinating the care of this patient's spontaneous ptx.   Alleen Borne, MD [facility] [phone]

## 2017-04-24 NOTE — Progress Notes (Signed)
Pt arrived to unit. Vitals obtained, pt placed on tele. Pt c/o "12"/10 pain in R chest. Jari Favreessa Conte, PA-C notified of his arrival. Family member at bedside. Call bell and phone within reach. Will wait for orders.

## 2017-04-25 ENCOUNTER — Inpatient Hospital Stay (HOSPITAL_COMMUNITY): Admission: RE | Admit: 2017-04-25 | Payer: Self-pay | Source: Ambulatory Visit | Admitting: Surgery

## 2017-04-25 ENCOUNTER — Telehealth: Payer: Self-pay | Admitting: *Deleted

## 2017-04-25 LAB — HIV ANTIBODY (ROUTINE TESTING W REFLEX): HIV Screen 4th Generation wRfx: NONREACTIVE

## 2017-04-25 SURGERY — VIDEO ASSISTED THORACOSCOPY
Anesthesia: General | Laterality: Right

## 2017-04-25 NOTE — Telephone Encounter (Signed)
Mr. Ronald Butler was admitted from the office to [facility] yesterday for a scheduled 04/25/17 R VATS for R pneumothorax. He left AMA last night. Dr. [name] said that if he returned for care that he would not treat him and felt the other providers would be in agreement. He called to explain that "he was treated terrible,wasn't given anything for pain, wasn't checked on for seven hours." I told him that Dr. [name] said he wasn't going to treat him anymore due to leaving AMA and that he should seek care from another facility. He voiced understanding.

## 2018-01-08 ENCOUNTER — Emergency Department: Payer: Self-pay

## 2018-01-08 ENCOUNTER — Emergency Department
Admission: EM | Admit: 2018-01-08 | Discharge: 2018-01-09 | Disposition: A | Discharge status: Home / Self Care | Payer: Self-pay | Attending: Emergency Medicine | Admitting: Emergency Medicine

## 2018-01-08 ENCOUNTER — Encounter: Payer: Self-pay | Admitting: Emergency Medicine

## 2018-01-08 DIAGNOSIS — R1084 Generalized abdominal pain: Secondary | ICD-10-CM | POA: Insufficient documentation

## 2018-01-08 DIAGNOSIS — Z79899 Other long term (current) drug therapy: Secondary | ICD-10-CM | POA: Insufficient documentation

## 2018-01-08 DIAGNOSIS — F17210 Nicotine dependence, cigarettes, uncomplicated: Secondary | ICD-10-CM | POA: Insufficient documentation

## 2018-01-08 HISTORY — DX: Acute pancreatitis without necrosis or infection, unspecified: K85.90

## 2018-01-08 LAB — CBC WITH DIFFERENTIAL/PLATELET
BASOS PCT: 1 %
Basophils Absolute: 0.1 10*3/uL (ref 0–0.1)
Eosinophils Absolute: 0.1 10*3/uL (ref 0–0.7)
Eosinophils Relative: 2 %
HEMATOCRIT: 38.2 % — AB (ref 40.0–52.0)
HEMOGLOBIN: 13.1 g/dL (ref 13.0–18.0)
Lymphocytes Relative: 30 %
Lymphs Abs: 2.7 10*3/uL (ref 1.0–3.6)
MCH: 30.7 pg (ref 26.0–34.0)
MCHC: 34.4 g/dL (ref 32.0–36.0)
MCV: 89.4 fL (ref 80.0–100.0)
MONOS PCT: 9 %
Monocytes Absolute: 0.8 10*3/uL (ref 0.2–1.0)
NEUTROS ABS: 5.3 10*3/uL (ref 1.4–6.5)
NEUTROS PCT: 58 %
Platelets: 219 10*3/uL (ref 150–440)
RBC: 4.28 MIL/uL — ABNORMAL LOW (ref 4.40–5.90)
RDW: 13.1 % (ref 11.5–14.5)
WBC: 9 10*3/uL (ref 3.8–10.6)

## 2018-01-08 LAB — COMPREHENSIVE METABOLIC PANEL
ALBUMIN: 4 g/dL (ref 3.5–5.0)
ALT: 16 U/L (ref 0–44)
ANION GAP: 9 (ref 5–15)
AST: 15 U/L (ref 15–41)
Alkaline Phosphatase: 68 U/L (ref 38–126)
BILIRUBIN TOTAL: 0.4 mg/dL (ref 0.3–1.2)
BUN: 19 mg/dL (ref 6–20)
CO2: 27 mmol/L (ref 22–32)
Calcium: 9 mg/dL (ref 8.9–10.3)
Chloride: 102 mmol/L (ref 98–111)
Creatinine, Ser: 0.77 mg/dL (ref 0.61–1.24)
GFR calc Af Amer: >60 mL/min (ref >60)
GFR calc non Af Amer: >60 mL/min (ref >60)
GLUCOSE: 110 mg/dL — AB (ref 70–99)
POTASSIUM: 3.2 mmol/L — AB (ref 3.5–5.1)
SODIUM: 138 mmol/L (ref 135–145)
TOTAL PROTEIN: 7.2 g/dL (ref 6.5–8.1)

## 2018-01-08 LAB — CK: CK TOTAL: 55 U/L (ref 49–397)

## 2018-01-08 LAB — PROTIME-INR
INR: 1.01
Prothrombin Time: 13.2 seconds (ref 11.4–15.2)

## 2018-01-08 LAB — ETHANOL: Alcohol, Ethyl (B): <10 mg/dL (ref <10)

## 2018-01-08 LAB — SALICYLATE LEVEL

## 2018-01-08 LAB — ACETAMINOPHEN LEVEL

## 2018-01-08 LAB — LIPASE, BLOOD: LIPASE: 34 U/L (ref 11–51)

## 2018-01-08 MED ORDER — HALOPERIDOL LACTATE 5 MG/ML IJ SOLN
2.5000 mg | Freq: Once | INTRAMUSCULAR | Status: AC
  Administered 2018-01-08: 2.5 mg via INTRAVENOUS
  Filled 2018-01-08: qty 1

## 2018-01-08 MED ORDER — GI COCKTAIL ~~LOC~~
30.0000 mL | Freq: Once | ORAL | Status: AC
  Administered 2018-01-08: 30 mL via ORAL
  Filled 2018-01-08: qty 30

## 2018-01-08 MED ORDER — FAMOTIDINE 20 MG PO TABS: 20.0000 mg | ORAL_TABLET | Freq: Two times a day (BID) | ORAL | 0 refills | Status: DC

## 2018-01-08 MED ORDER — IOHEXOL 300 MG/ML  SOLN
100.0000 mL | Freq: Once | INTRAMUSCULAR | Status: AC | PRN
  Administered 2018-01-08: 100 mL via INTRAVENOUS

## 2018-01-08 MED ORDER — FENTANYL CITRATE (PF) 100 MCG/2ML IJ SOLN
75.0000 ug | Freq: Once | INTRAMUSCULAR | Status: AC
  Administered 2018-01-08: 75 ug via INTRAVENOUS
  Filled 2018-01-08: qty 2

## 2018-01-08 NOTE — ED Triage Notes (Signed)
Pt reports hx of liver and kidney problems, has been taking a lot of tylenol and has had abdominal pain for several days.

## 2018-01-08 NOTE — ED Provider Notes (Signed)
[facility] Emergency Department Provider Note  ____________________________________________   First MD Initiated Contact with Patient 01/08/18 2236     (approximate)  I have reviewed the triage vital signs and the nursing notes.   HISTORY  Chief Complaint Abdominal Pain   HPI Ronald Butler is a 42 y.o. male who self presents to the emergency department with diffuse severe gradual onset cramping upper abdominal pain for the past several days.  He is concerned that he has cirrhosis as well as kidney failure.  He says he has been taking 600 mg of ibuprofen 3 times a day for the past several months as well as Goody's powder 2-3 times a day.  According to the patient 10 years ago he had cirrhosis and kidney failure and made a complete recovery.  His symptoms are gradual onset constant severe.  Nothing seems to make it better or worse.   Past Medical History:  Diagnosis Date  . Pancreatitis     Patient Active Problem List   Diagnosis Date Noted  . 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 APPENDECTOMY    . CHOLECYSTECTOMY      Prior to Admission medications   Medication Sig Start Date End Date Taking? Authorizing Provider  famotidine (PEPCID) 20 MG tablet Take 1 tablet (20 mg total) by mouth 2 (two) times daily. 01/08/18 01/08/19  Merrily Brittleifenbark, Barba Solt, MD  ibuprofen (ADVIL,MOTRIN) 200 MG tablet Take 600-800 mg by mouth every 6 (six) hours as needed for fever, headache, mild pain, moderate pain or cramping.    [provider]    Allergies Patient has no known allergies.  No family history on file.  Social History Social History   Tobacco Use  . Smoking status: Current Every Day Smoker    Packs/day: 0.50    Types: Cigarettes  . Smokeless tobacco: Never Used  Substance Use Topics  . Alcohol use: Yes    Comment: occassional  . Drug use: No    Review of Systems Constitutional: No  fever/chills Eyes: No visual changes. ENT: No sore throat. Cardiovascular: Denies chest pain. Respiratory: Denies shortness of breath. Gastrointestinal: Positive for abdominal pain.  Positive for nausea, no vomiting.  No diarrhea.  No constipation. Genitourinary: Negative for dysuria. Musculoskeletal: Negative for back pain. Skin: Negative for rash. Neurological: Negative for headaches, focal weakness or numbness.   ____________________________________________   PHYSICAL EXAM:  VITAL SIGNS: ED Triage Vitals [01/08/18 2226]  Enc Vitals Group     BP      Pulse      Resp      Temp      Temp src      SpO2      Weight 165 lb (74.8 kg)     Height 5\' 8"  (1.727 m)     Head Circumference      Peak Flow      Pain Score 8     Pain Loc      Pain Edu?      Excl. in GC?     Constitutional: Alert and oriented x4 anxious appearing Eyes: PERRL EOMI. midrange and brisk Head: Atraumatic. Nose: No congestion/rhinnorhea. Mouth/Throat: No trismus Neck: No stridor.   Cardiovascular: Tachycardic rate, regular rhythm. Grossly normal heart sounds.  Good peripheral circulation. Respiratory: Increased respiratory effort.  No retractions. Lungs CTAB and moving good air Gastrointestinal: Soft diffuse tenderness with no rebound or guarding no peritonitis Musculoskeletal: No lower extremity edema  Neurologic:  Normal speech and language. No gross focal neurologic deficits are appreciated. Skin: Several spider angiomata across his upper chest Psychiatric: She is appearing    ____________________________________________   DIFFERENTIAL includes but not limited to  Bowel obstruction, acetaminophen toxicity, salicylate, urinary tract infection ____________________________________________   LABS (all labs ordered are listed, but only abnormal results are displayed)  Labs Reviewed  ACETAMINOPHEN LEVEL - Abnormal; Notable for the following components:      Result Value   Acetaminophen  (Tylenol), Serum <10 (*)    All other components within normal limits  COMPREHENSIVE METABOLIC PANEL - Abnormal; Notable for the following components:   Potassium 3.2 (*)    Glucose, Bld 110 (*)    All other components within normal limits  CBC WITH DIFFERENTIAL/PLATELET - Abnormal; Notable for the following components:   RBC 4.28 (*)    HCT 38.2 (*)    All other components within normal limits  URINALYSIS, COMPLETE (UACMP) WITH MICROSCOPIC - Abnormal; Notable for the following components:   Color, Urine YELLOW (*)    APPearance CLEAR (*)    Hgb urine dipstick SMALL (*)    Bacteria, UA RARE (*)    All other components within normal limits  URINE DRUG SCREEN, QUALITATIVE ([facility] ONLY) - Abnormal; Notable for the following components:   Cannabinoid 50 Ng, Ur [place] POSITIVE (*)    Barbiturates, Ur Screen   (*)    Value: Result not available. Reagent lot number recalled by manufacturer.   All other components within normal limits  ETHANOL  PROTIME-INR  SALICYLATE LEVEL  CK  LIPASE, BLOOD    Lab work reviewed by me consistent with cannabis use otherwise unremarkable __________________________________________  EKG   ED ECG REPORT I, Merrily Brittle, the attending physician, personally viewed and interpreted this ECG.  Date: 01/10/2018 EKG Time:  Rate: 92 Rhythm: normal sinus rhythm QRS Axis: Rightward axis Intervals: normal ST/T Wave abnormalities: normal Narrative Interpretation: no evidence of acute ischemia  ____________________________________________  RADIOLOGY  The abdomen pelvis no acute disease ____________________________________________   PROCEDURES  Procedure(s) performed: no  Procedures  Critical Care performed: no  ____________________________________________   INITIAL IMPRESSION / ASSESSMENT AND PLAN / ED COURSE  Pertinent labs & imaging results that were available during my care of the patient were reviewed by me and considered in my medical  decision making (see chart for details).   Patient arrives anxious and uncomfortable appearing.  He does not appear to be withdrawing from alcohol or stimulants.  His possible ingestion is chronic and no indication for charcoal at this point.  CT abdomen pelvis is pending.  IV Haldol for pain.  After Haldol the patient's symptoms are remarkably improved.  CT scan is reassuring and the patient feels significant relief.  Lab work unremarkable.  I will start him on Pepcid twice a day and refer him back to primary care.      ____________________________________________   FINAL CLINICAL IMPRESSION(S) / ED DIAGNOSES  Final diagnoses:  Generalized abdominal pain      NEW MEDICATIONS STARTED DURING THIS VISIT:  Discharge Medication List as of 01/08/2018 11:57 PM    START taking these medications   Details  famotidine (PEPCID) 20 MG tablet Take 1 tablet (20 mg total) by mouth 2 (two) times daily., Starting Thu 01/08/2018, Until Fri 01/08/2019, Print         Note:  This document was prepared using Dragon voice recognition software and may include unintentional dictation errors.     Talullah Abate,  Lloyd Huger, MD 01/10/18 1610

## 2018-01-08 NOTE — Discharge Instructions (Addendum)
Please stop taking Goody's powder and stop taking ibuprofen for at least 1 month.  Begin taking your antacids twice a day and make an appointment to follow-up with the GI doctor for reevaluation.  Return to the emergency department sooner for any concerns whatsoever.  It was a pleasure to take care of you today, and thank you for coming to our emergency department.  If you have any questions or concerns before leaving please ask the nurse to grab me and I'm more than happy to go through your aftercare instructions again.  If you were prescribed any opioid pain medication today such as Norco, Vicodin, Percocet, morphine, hydrocodone, or oxycodone please make sure you do not drive when you are taking this medication as it can alter your ability to drive safely.  If you have any concerns once you are home that you are not improving or are in fact getting worse before you can make it to your follow-up appointment, please do not hesitate to call 911 and come back for further evaluation.  Merrily Brittle, MD  Results for orders placed or performed during the hospital encounter of 01/08/18  Acetaminophen level  Result Value Ref Range   Acetaminophen (Tylenol), Serum <10 (L) 10 - 30 ug/mL  Comprehensive metabolic panel  Result Value Ref Range   Sodium 138 135 - 145 mmol/L   Potassium 3.2 (L) 3.5 - 5.1 mmol/L   Chloride 102 98 - 111 mmol/L   CO2 27 22 - 32 mmol/L   Glucose, Bld 110 (H) 70 - 99 mg/dL   BUN 19 6 - 20 mg/dL   Creatinine, Ser 1.61 0.61 - 1.24 mg/dL   Calcium 9.0 8.9 - 09.6 mg/dL   Total Protein 7.2 6.5 - 8.1 g/dL   Albumin 4.0 3.5 - 5.0 g/dL   AST 15 15 - 41 U/L   ALT 16 0 - 44 U/L   Alkaline Phosphatase 68 38 - 126 U/L   Total Bilirubin 0.4 0.3 - 1.2 mg/dL   GFR calc non Af Amer >60 >60 mL/min   GFR calc Af Amer >60 >60 mL/min   Anion gap 9 5 - 15  Ethanol  Result Value Ref Range   Alcohol, Ethyl (B) <10 <10 mg/dL  CBC with Differential  Result Value Ref Range   WBC 9.0 3.8  - 10.6 K/uL   RBC 4.28 (L) 4.40 - 5.90 MIL/uL   Hemoglobin 13.1 13.0 - 18.0 g/dL   HCT 04.5 (L) 40.9 - 81.1 %   MCV 89.4 80.0 - 100.0 fL   MCH 30.7 26.0 - 34.0 pg   MCHC 34.4 32.0 - 36.0 g/dL   RDW 91.4 78.2 - 95.6 %   Platelets 219 150 - 440 K/uL   Neutrophils Relative % 58 %   Neutro Abs 5.3 1.4 - 6.5 K/uL   Lymphocytes Relative 30 %   Lymphs Abs 2.7 1.0 - 3.6 K/uL   Monocytes Relative 9 %   Monocytes Absolute 0.8 0.2 - 1.0 K/uL   Eosinophils Relative 2 %   Eosinophils Absolute 0.1 0 - 0.7 K/uL   Basophils Relative 1 %   Basophils Absolute 0.1 0 - 0.1 K/uL  Protime-INR  Result Value Ref Range   Prothrombin Time 13.2 11.4 - 15.2 seconds   INR 1.01   Salicylate level  Result Value Ref Range   Salicylate Lvl <7.0 2.8 - 30.0 mg/dL  CK  Result Value Ref Range   Total CK 55 49 - 397 U/L  Lipase,  blood  Result Value Ref Range   Lipase 34 11 - 51 U/L   Ct Abdomen Pelvis W Contrast  Result Date: 01/08/2018 CLINICAL DATA:  Generalized abdominal pain. Assess pancreatitis. Recent increased Tylenol intake. History of appendectomy, cholecystectomy. EXAM: CT ABDOMEN AND PELVIS WITH CONTRAST TECHNIQUE: Multidetector CT imaging of the abdomen and pelvis was performed using the standard protocol following bolus administration of intravenous contrast. CONTRAST:  100mL OMNIPAQUE IOHEXOL 300 MG/ML  SOLN COMPARISON:  CT abdomen and pelvis August 04, 2009. FINDINGS: LOWER CHEST: Minimal dependent atelectasis. Included heart size is normal. No pericardial effusion. HEPATOBILIARY: Status post cholecystectomy. Minimal postoperative pneumobilia, negative CT liver. PANCREAS: Normal. SPLEEN: Normal. ADRENALS/URINARY TRACT: Kidneys are orthotopic, demonstrating symmetric enhancement. No nephrolithiasis, hydronephrosis or solid renal masses. 13 mm homogeneously hypodense simple cyst RIGHT lower pole. Too small to characterize hypodensities bilateral hypodensities. The unopacified ureters are normal in course  and caliber. Delayed imaging through the kidneys demonstrates symmetric prompt contrast excretion within the proximal urinary collecting system. Urinary bladder is partially distended and unremarkable. Normal adrenal glands. STOMACH/BOWEL: The stomach, small and large bowel are normal in course and caliber without inflammatory changes sensitivity decreased without oral contrast. Small amount of small bowel feces compatible with chronic stasis. Status post appendectomy. VASCULAR/LYMPHATIC: Aortoiliac vessels are normal in course and caliber. Mild calcific atherosclerosis. No lymphadenopathy by CT size criteria. REPRODUCTIVE: Normal. OTHER: No intraperitoneal free fluid or free air. MUSCULOSKELETAL: Asymmetrically thickened LEFT inferior rectus muscle with associated mild subcutaneous fat stranding. Small to moderate umbilical fat containing hernia. Very small fat containing LEFT inguinal hernia. LEFT iliac bone islands versus enchondromas. IMPRESSION: 1. Asymmetrically thickened LEFT inferior rectus muscle with subcutaneous fat stranding, possible hematoma. Recommend correlation with point tenderness. 2. No acute intra-abdominal or pelvic process. 3. Status post cholecystectomy and appendectomy. Aortic Atherosclerosis (ICD10-I70.0). Electronically Signed   By: Awilda Metroourtnay  Bloomer M.D.   On: 01/08/2018 23:43

## 2018-01-09 ENCOUNTER — Telehealth: Payer: Self-pay | Admitting: Gastroenterology

## 2018-01-09 LAB — URINALYSIS, COMPLETE (UACMP) WITH MICROSCOPIC
Bilirubin Urine: NEGATIVE
Glucose, UA: NEGATIVE mg/dL
KETONES UR: NEGATIVE mg/dL
Leukocytes, UA: NEGATIVE
Nitrite: NEGATIVE
PH: 5 (ref 5.0–8.0)
PROTEIN: NEGATIVE mg/dL
Specific Gravity, Urine: 1.021 (ref 1.005–1.030)

## 2018-01-09 LAB — URINE DRUG SCREEN, QUALITATIVE (ARMC ONLY)
AMPHETAMINES, UR SCREEN: NOT DETECTED
BENZODIAZEPINE, UR SCRN: NOT DETECTED
Cannabinoid 50 Ng, Ur ~~LOC~~: POSITIVE — AB
Cocaine Metabolite,Ur ~~LOC~~: NOT DETECTED
MDMA (Ecstasy)Ur Screen: NOT DETECTED
Methadone Scn, Ur: NOT DETECTED
OPIATE, UR SCREEN: NOT DETECTED
Phencyclidine (PCP) Ur S: NOT DETECTED
TRICYCLIC, UR SCREEN: NOT DETECTED

## 2018-01-09 NOTE — Telephone Encounter (Signed)
Attempted to call pt to schedule ED fu  Lady that answered  Stated I had the wrong number

## 2018-04-05 ENCOUNTER — Emergency Department: Payer: Self-pay

## 2018-04-05 ENCOUNTER — Emergency Department
Admission: EM | Admit: 2018-04-05 | Discharge: 2018-04-05 | Disposition: A | Discharge status: Home / Self Care | Payer: Self-pay | Attending: Emergency Medicine | Admitting: Emergency Medicine

## 2018-04-05 ENCOUNTER — Other Ambulatory Visit: Payer: Self-pay

## 2018-04-05 DIAGNOSIS — F1193 Opioid use, unspecified with withdrawal: Secondary | ICD-10-CM

## 2018-04-05 DIAGNOSIS — F1123 Opioid dependence with withdrawal: Secondary | ICD-10-CM | POA: Insufficient documentation

## 2018-04-05 DIAGNOSIS — F1910 Other psychoactive substance abuse, uncomplicated: Secondary | ICD-10-CM | POA: Insufficient documentation

## 2018-04-05 DIAGNOSIS — F1120 Opioid dependence, uncomplicated: Secondary | ICD-10-CM

## 2018-04-05 DIAGNOSIS — F17210 Nicotine dependence, cigarettes, uncomplicated: Secondary | ICD-10-CM | POA: Insufficient documentation

## 2018-04-05 DIAGNOSIS — Z79899 Other long term (current) drug therapy: Secondary | ICD-10-CM | POA: Insufficient documentation

## 2018-04-05 HISTORY — DX: Unspecified convulsions: R56.9

## 2018-04-05 LAB — COMPREHENSIVE METABOLIC PANEL
ALBUMIN: 4.5 g/dL (ref 3.5–5.0)
ALT: 20 U/L (ref 0–44)
AST: 19 U/L (ref 15–41)
Alkaline Phosphatase: 76 U/L (ref 38–126)
Anion gap: 12 (ref 5–15)
BUN: 14 mg/dL (ref 6–20)
CALCIUM: 9.6 mg/dL (ref 8.9–10.3)
CHLORIDE: 107 mmol/L (ref 98–111)
CO2: 21 mmol/L — ABNORMAL LOW (ref 22–32)
Creatinine, Ser: 0.97 mg/dL (ref 0.61–1.24)
GFR calc Af Amer: >60 mL/min (ref >60)
GFR calc non Af Amer: >60 mL/min (ref >60)
Glucose, Bld: 104 mg/dL — ABNORMAL HIGH (ref 70–99)
POTASSIUM: 3.9 mmol/L (ref 3.5–5.1)
SODIUM: 140 mmol/L (ref 135–145)
TOTAL PROTEIN: 7.6 g/dL (ref 6.5–8.1)
Total Bilirubin: 0.6 mg/dL (ref 0.3–1.2)

## 2018-04-05 LAB — CBC
HCT: 39.5 % — ABNORMAL LOW (ref 40.0–52.0)
Hemoglobin: 13.8 g/dL (ref 13.0–18.0)
MCH: 30.7 pg (ref 26.0–34.0)
MCHC: 34.8 g/dL (ref 32.0–36.0)
MCV: 88.2 fL (ref 80.0–100.0)
PLATELETS: 218 10*3/uL (ref 150–440)
RBC: 4.48 MIL/uL (ref 4.40–5.90)
RDW: 13.1 % (ref 11.5–14.5)
WBC: 8.1 10*3/uL (ref 3.8–10.6)

## 2018-04-05 LAB — ETHANOL: Alcohol, Ethyl (B): <10 mg/dL (ref <10)

## 2018-04-05 MED ORDER — KETOROLAC TROMETHAMINE 30 MG/ML IJ SOLN
15.0000 mg | INTRAMUSCULAR | Status: AC
  Administered 2018-04-05: 15 mg via INTRAVENOUS
  Filled 2018-04-05: qty 1

## 2018-04-05 MED ORDER — ONDANSETRON 4 MG PO TBDP: 4.0000 mg | ORAL_TABLET | Freq: Three times a day (TID) | ORAL | 0 refills | Status: DC | PRN

## 2018-04-05 MED ORDER — SODIUM CHLORIDE 0.9 % IV BOLUS: 1000.0000 mL | Freq: Once | INTRAVENOUS | Status: DC

## 2018-04-05 MED ORDER — ONDANSETRON HCL 4 MG/2ML IJ SOLN
4.0000 mg | Freq: Once | INTRAMUSCULAR | Status: AC
  Administered 2018-04-05: 4 mg via INTRAVENOUS
  Filled 2018-04-05: qty 2

## 2018-04-05 MED ORDER — LOPERAMIDE HCL 2 MG PO CAPS
4.0000 mg | ORAL_CAPSULE | Freq: Once | ORAL | Status: AC
  Administered 2018-04-05: 4 mg via ORAL
  Filled 2018-04-05: qty 2

## 2018-04-05 MED ORDER — CLONIDINE HCL 0.1 MG PO TABS
0.1000 mg | ORAL_TABLET | Freq: Once | ORAL | Status: AC
  Administered 2018-04-05: 0.1 mg via ORAL
  Filled 2018-04-05: qty 1

## 2018-04-05 MED ORDER — LOPERAMIDE HCL 2 MG PO TABS: 4.0000 mg | ORAL_TABLET | Freq: Four times a day (QID) | ORAL | 0 refills | Status: DC | PRN

## 2018-04-05 NOTE — ED Notes (Signed)
Dr stafford at bedside 

## 2018-04-05 NOTE — ED Provider Notes (Signed)
[facility] Emergency Department Provider Note  ____________________________________________  Time seen: Approximately 5:05 PM  I have reviewed the triage vital signs and the nursing notes.   HISTORY  Chief Complaint Psychiatric Evaluation    HPI Ronald Butler is a 42 y.o. male with a history of spontaneous pneumothorax and with a history of pancreatitis seizures and daily heroin abuse who presents with feeling of agitation, bugs crawling all over his skin, diffuse body aches.  Denies belly pain vomiting or diarrhea.  Last used heroin IV last night.  Request detox and states "I want my life back".  Only uses IV in the left antecubital fossa.  Does not lick needles, no cotton shots, no skin pops.  Uses clean needles that are only his.   Reports having a nonproductive cough for the past couple of days.  No shortness of breath or chest pain.  Patient request medicine for his withdrawal symptoms.     Past Medical History:  Diagnosis Date  . Pancreatitis   . Seizures [facility])      Patient Active Problem List   Diagnosis Date Noted  . 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 APPENDECTOMY    . CHOLECYSTECTOMY       Prior to Admission medications   Medication Sig Start Date End Date Taking? Authorizing Provider  famotidine (PEPCID) 20 MG tablet Take 1 tablet (20 mg total) by mouth 2 (two) times daily. 01/08/18 01/08/19 Yes Merrily Brittle, MD  ibuprofen (ADVIL,MOTRIN) 200 MG tablet Take 600-800 mg by mouth every 6 (six) hours as needed for fever, headache, mild pain, moderate pain or cramping.    [provider]  loperamide (IMODIUM A-D) 2 MG tablet Take 2 tablets (4 mg total) by mouth 4 (four) times daily as needed for diarrhea or loose stools. 04/05/18   Sharman Cheek, MD  ondansetron (ZOFRAN ODT) 4 MG disintegrating tablet Take 1 tablet (4 mg total) by mouth every 8 (eight) hours as  needed for nausea or vomiting. 04/05/18   Sharman Cheek, MD     Allergies Patient has no known allergies.   No family history on file.  Social History Social History   Tobacco Use  . Smoking status: Current Every Day Smoker    Packs/day: 0.50    Types: Cigarettes  . Smokeless tobacco: Never Used  Substance Use Topics  . Alcohol use: Yes    Comment: occassional  . Drug use: Yes    Review of Systems  Constitutional:   No fever or chills.  ENT:   No sore throat. No rhinorrhea. Cardiovascular:   No chest pain or syncope. Respiratory:   No dyspnea, positive nonproductive cough. Gastrointestinal:   Negative for abdominal pain, vomiting and diarrhea.  Musculoskeletal:   Negative for focal pain or swelling All other systems reviewed and are negative except as documented above in ROS and HPI.  ____________________________________________   PHYSICAL EXAM:  VITAL SIGNS: ED Triage Vitals  Enc Vitals Group     BP 04/05/18 1356 (S) (!) 135/114     Pulse Rate 04/05/18 1356 94     Resp 04/05/18 1356 20     Temp 04/05/18 1356 98.2 F (36.8 C)     Temp src --      SpO2 04/05/18 1356 100 %     Weight 04/05/18 1355 160 lb (72.6 kg)     Height 04/05/18 1355 5\' 10"  (1.778 m)     Head  Circumference --      Peak Flow --      Pain Score 04/05/18 1355 10     Pain Loc --      Pain Edu? --      Excl. in GC? --     Vital signs reviewed, nursing assessments reviewed.   Constitutional:   Alert and oriented. Non-toxic appearance. Eyes:   Conjunctivae are normal. EOMI. PERRL, mydriatic but reactive. ENT      Head:   Normocephalic and atraumatic.      Nose:   No congestion/rhinnorhea.       Mouth/Throat:   MMM, no pharyngeal erythema. No peritonsillar mass.       Neck:   No meningismus. Full ROM. Hematological/Lymphatic/Immunilogical:   No cervical lymphadenopathy. Cardiovascular:   RRR. Symmetric bilateral radial and DP pulses.  No murmurs. Cap refill less than 2  seconds. Respiratory:   Normal respiratory effort without tachypnea/retractions. Breath sounds are clear and equal bilaterally. No wheezes/rales/rhonchi. Gastrointestinal:   Soft and nontender. Non distended. There is no CVA tenderness.  No rebound, rigidity, or guarding.  Normoactive bowel sounds  Musculoskeletal:   Normal range of motion in all extremities. No joint effusions.  No lower extremity tenderness.  No edema. Neurologic:   Normal speech and language.  Motor grossly intact. No acute focal neurologic deficits are appreciated.  Skin:    Skin is warm, dry and intact. No rash noted.  No petechiae, purpura, or bullae.  No piloerection.  Track marks of the left any cubital fossa without inflammatory changes swelling induration tenderness or fluctuance.  No crepitus.  No streaking.  No evidence of foreign body  ____________________________________________    LABS (pertinent positives/negatives) (all labs ordered are listed, but only abnormal results are displayed) Labs Reviewed  COMPREHENSIVE METABOLIC PANEL - Abnormal; Notable for the following components:      Result Value   CO2 21 (*)    Glucose, Bld 104 (*)    All other components within normal limits  CBC - Abnormal; Notable for the following components:   HCT 39.5 (*)    All other components within normal limits  ETHANOL  URINE DRUG SCREEN, QUALITATIVE ([facility] ONLY)   ____________________________________________   EKG    ____________________________________________    RADIOLOGY  No results found.  ____________________________________________   PROCEDURES Procedures  ____________________________________________  DIFFERENTIAL DIAGNOSIS   Atypical pneumonia, opiate withdrawal, viral syndrome  CLINICAL IMPRESSION / ASSESSMENT AND PLAN / ED COURSE  Pertinent labs & imaging results that were available during my care of the patient were reviewed by me and considered in my medical decision making (see chart for  details).      Clinical Course as of Apr 05 1738  Wynelle Link Apr 05, 2018  1538 Patient presents with "heroin withdrawal" requesting medications for his symptoms which consist of whole body aches and a sensation of formication.  Denies vomiting or diarrhea.  Has cough but denies chest pain or shortness of breath.  Last used IV heroin last night.  I will give saline bolus for hydration, Toradol Zofran clonidine and Imodium.  Check a chest x-[place] even though he has minimal respiratory symptoms due to his history of pneumothorax and likely recurrent episodes of unconsciousness from intoxication putting him at risk of aspiration.   [PS]    Clinical Course User Index [PS] Sharman Cheek, MD     ----------------------------------------- 5:39 PM on 04/05/2018 -----------------------------------------  Vitals remain normal, patient more calm and comfortable.  He desires to leave.  He was given outpatient resources by TTS.  Medically stable at this time.  I will try to manage his symptoms with Imodium and Zofran.  Due to his already normal blood pressure, use of clonidine at home is risky.  ____________________________________________   FINAL CLINICAL IMPRESSION(S) / ED DIAGNOSES    Final diagnoses:  Heroin withdrawal (HCC)  Heroin dependence (HCC)  IV drug abuse [facility])     ED Discharge Orders         Ordered    ondansetron (ZOFRAN ODT) 4 MG disintegrating tablet  Every 8 hours PRN     04/05/18 1739    loperamide (IMODIUM A-D) 2 MG tablet  4 times daily PRN     04/05/18 1739          Portions of this note were generated with dragon dictation software. Dictation errors may occur despite best attempts at proofreading.    Sharman Cheek, MD 04/05/18 1740

## 2018-04-05 NOTE — ED Notes (Signed)
Detox resources given to pt with discharge paperwork.

## 2018-04-05 NOTE — ED Notes (Signed)
Pt requesting to see MD. Dr. Scotty Court notified.

## 2018-04-05 NOTE — ED Notes (Signed)
Pt agitation has slightly improved, pt previously removed monitor, left off to improve agitation.

## 2018-04-05 NOTE — ED Notes (Signed)
Pt requesting to leave. Dr. Scotty Court notified.

## 2018-04-05 NOTE — ED Notes (Signed)
Patient transported to [facility] 

## 2018-04-05 NOTE — ED Triage Notes (Signed)
Pt states he has been using heroin for 6 months. Requesting detox. Last use 10/5.

## 2018-04-05 NOTE — ED Notes (Signed)
Pt refusing xray at this time

## 2018-04-05 NOTE — ED Notes (Signed)
Waiting on MD exam 

## 2020-04-18 ENCOUNTER — Emergency Department (HOSPITAL_COMMUNITY): Payer: Self-pay

## 2020-04-18 ENCOUNTER — Encounter (HOSPITAL_COMMUNITY): Payer: Self-pay

## 2020-04-18 ENCOUNTER — Emergency Department (HOSPITAL_COMMUNITY)
Admission: EM | Admit: 2020-04-18 | Discharge: 2020-04-18 | Disposition: A | Discharge status: Home / Self Care | Payer: Self-pay | Attending: Emergency Medicine | Admitting: Emergency Medicine

## 2020-04-18 ENCOUNTER — Other Ambulatory Visit: Payer: Self-pay

## 2020-04-18 DIAGNOSIS — Z20822 Contact with and (suspected) exposure to covid-19: Secondary | ICD-10-CM | POA: Insufficient documentation

## 2020-04-18 DIAGNOSIS — R0602 Shortness of breath: Secondary | ICD-10-CM | POA: Insufficient documentation

## 2020-04-18 DIAGNOSIS — F17210 Nicotine dependence, cigarettes, uncomplicated: Secondary | ICD-10-CM | POA: Insufficient documentation

## 2020-04-18 DIAGNOSIS — R569 Unspecified convulsions: Secondary | ICD-10-CM | POA: Insufficient documentation

## 2020-04-18 DIAGNOSIS — R000 Tachycardia, unspecified: Secondary | ICD-10-CM | POA: Insufficient documentation

## 2020-04-18 DIAGNOSIS — R4182 Altered mental status, unspecified: Secondary | ICD-10-CM

## 2020-04-18 HISTORY — DX: Pneumothorax, unspecified: J93.9

## 2020-04-18 LAB — COMPREHENSIVE METABOLIC PANEL
ALT: 14 U/L (ref 0–44)
AST: 16 U/L (ref 15–41)
Albumin: 3.9 g/dL (ref 3.5–5.0)
Alkaline Phosphatase: 60 U/L (ref 38–126)
Anion gap: 11 (ref 5–15)
BUN: 10 mg/dL (ref 6–20)
CO2: 21 mmol/L — ABNORMAL LOW (ref 22–32)
Calcium: 9.1 mg/dL (ref 8.9–10.3)
Chloride: 106 mmol/L (ref 98–111)
Creatinine, Ser: 1.25 mg/dL — ABNORMAL HIGH (ref 0.61–1.24)
GFR, Estimated: >60 mL/min (ref >60)
Glucose, Bld: 99 mg/dL (ref 70–99)
Potassium: 4 mmol/L (ref 3.5–5.1)
Sodium: 138 mmol/L (ref 135–145)
Total Bilirubin: 0.2 mg/dL — ABNORMAL LOW (ref 0.3–1.2)
Total Protein: 6.4 g/dL — ABNORMAL LOW (ref 6.5–8.1)

## 2020-04-18 LAB — CBC WITH DIFFERENTIAL/PLATELET
Abs Immature Granulocytes: 0.04 10*3/uL (ref 0.00–0.07)
Basophils Absolute: 0 10*3/uL (ref 0.0–0.1)
Basophils Relative: 1 %
Eosinophils Absolute: 0 10*3/uL (ref 0.0–0.5)
Eosinophils Relative: 0 %
HCT: 40.2 % (ref 39.0–52.0)
Hemoglobin: 13.1 g/dL (ref 13.0–17.0)
Immature Granulocytes: 1 %
Lymphocytes Relative: 9 %
Lymphs Abs: 0.8 10*3/uL (ref 0.7–4.0)
MCH: 29.8 pg (ref 26.0–34.0)
MCHC: 32.6 g/dL (ref 30.0–36.0)
MCV: 91.4 fL (ref 80.0–100.0)
Monocytes Absolute: 0.4 10*3/uL (ref 0.1–1.0)
Monocytes Relative: 5 %
Neutro Abs: 7.5 10*3/uL (ref 1.7–7.7)
Neutrophils Relative %: 84 %
Platelets: 212 10*3/uL (ref 150–400)
RBC: 4.4 MIL/uL (ref 4.22–5.81)
RDW: 12.8 % (ref 11.5–15.5)
WBC: 8.8 10*3/uL (ref 4.0–10.5)
nRBC: 0 % (ref 0.0–0.2)

## 2020-04-18 LAB — ACETAMINOPHEN LEVEL: Acetaminophen (Tylenol), Serum: <10 ug/mL — ABNORMAL LOW (ref 10–30)

## 2020-04-18 LAB — SALICYLATE LEVEL: Salicylate Lvl: <7 mg/dL — ABNORMAL LOW (ref 7.0–30.0)

## 2020-04-18 LAB — RESPIRATORY PANEL BY RT PCR (FLU A&B, COVID)
Influenza A by PCR: NEGATIVE
Influenza B by PCR: NEGATIVE
SARS Coronavirus 2 by RT PCR: NEGATIVE

## 2020-04-18 LAB — ETHANOL: Alcohol, Ethyl (B): <10 mg/dL (ref <10)

## 2020-04-18 LAB — CBG MONITORING, ED: Glucose-Capillary: 101 mg/dL — ABNORMAL HIGH (ref 70–99)

## 2020-04-18 MED ORDER — NALOXONE HCL 0.4 MG/ML IJ SOLN
0.4000 mg | Freq: Once | INTRAMUSCULAR | Status: AC
  Administered 2020-04-18: 0.4 mg via INTRAVENOUS
  Filled 2020-04-18: qty 1

## 2020-04-18 MED ORDER — SODIUM CHLORIDE 0.9 % IV BOLUS
1000.0000 mL | Freq: Once | INTRAVENOUS | Status: AC
  Administered 2020-04-18: 1000 mL via INTRAVENOUS

## 2020-04-18 MED ORDER — NALOXONE HCL 0.4 MG/ML IJ SOLN
0.4000 mg | Freq: Once | INTRAMUSCULAR | Status: DC
  Filled 2020-04-18: qty 1

## 2020-04-18 MED ORDER — NALOXONE HCL 0.4 MG/ML IJ SOLN: 0.4000 mg | Freq: Once | INTRAMUSCULAR | Status: DC

## 2020-04-18 NOTE — Discharge Instructions (Addendum)
  Please follow-up with your PCP for further evaluation management.  Come back to the emergency department if you develop chest pain, shortness of breath, severe abdominal pain, uncontrolled nausea, vomiting, diarrhea.

## 2020-04-18 NOTE — ED Notes (Signed)
Pt arousalble to voice, eyes open and close, pt has garbled sounds, but at this time unable to form clear sentences. [employer] at bedside, provider at bedside.

## 2020-04-18 NOTE — ED Notes (Signed)
Pt placed in wheelchair and released in [employer] custody.

## 2020-04-18 NOTE — ED Triage Notes (Signed)
Pt presents to ED via EMS. Pt was being arrested thiss morning when he complained of CP and was transported to [employer]. Pt was aggressive and combative with staff so he was dismissed from [facility] and sent to jail. On the way to jail pt had 3 seizures and became unresponsive. EMS witnessed 1 additional seizure. 2.5mg  versed was given IV. No incontinence or oral trauma. PT remained unresponsive on arrival to ED, but regained consciousness to tell staff  "I don't need the narcan".   18G LAC 112/78 HR 82 100% NRB CBG 131

## 2020-04-18 NOTE — Discharge Instructions (Signed)
Your CT head and labs were unremarkable.  Since Narcan woke you up, I suspected that you took heroin.  Avoid doing drugs.  See your doctor.  Return to ER if you have lethargy, altered mental status, seizure.

## 2020-04-18 NOTE — Progress Notes (Signed)
RT called to assess patient brought in by [employer]. Patient became agitated and fighting. Patient ultimately in no respiratory compromise/distress and being taken to jail by [employer].

## 2020-04-18 NOTE — ED Provider Notes (Signed)
[facility] EMERGENCY DEPARTMENT Provider Note   CSN: [id number] Arrival date & time: 04/18/20  1752     History Chief Complaint  Patient presents with  . Seizures    Ronald Butler is a 43 y.o. male hx of pneumothorax here presenting with possible seizure.  Patient was arrested and was supposed to go to jail.  Patient then became unresponsive.  Went to [facility] and patient was agitated.  Police was called again and patient was transported to jail.  Upon transporting to jail, patient apparently had 3 seizure-like activities.  On arrival, patient is actively closing his eyes and refusing exam.  Patient certainly wakes up when he found out we are giving Narcan and told us not to give Narcan.  Patient has a known heroin abuse.  Upon review of his chart, patient has not have any history of seizures.  The history is provided by the patient and the EMS personnel.       Past Medical History:  Diagnosis Date  . Pancreatitis   . Pneumothorax   . Seizures [facility])     Patient Active Problem List   Diagnosis Date Noted  . 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 APPENDECTOMY    . CHOLECYSTECTOMY         Family History  Family history unknown: Yes    Social History   Tobacco Use  . Smoking status: Current Every Day Smoker    Packs/day: 2.00    Types: Cigarettes  . Smokeless tobacco: Never Used  Vaping Use  . Vaping Use: Never used  Substance Use Topics  . Alcohol use: Yes    Comment: occassional  . Drug use: Yes    Types: Marijuana    Home Medications Prior to Admission medications   Medication Sig Start Date End Date Taking? Authorizing Provider  famotidine (PEPCID) 20 MG tablet Take 1 tablet (20 mg total) by mouth 2 (two) times daily. 01/08/18 01/08/19  Merrily Brittle, MD  ibuprofen (ADVIL,MOTRIN) 200 MG tablet Take 600-800 mg by mouth every 6 (six) hours as needed for fever,  headache, mild pain, moderate pain or cramping.    [provider]  loperamide (IMODIUM A-D) 2 MG tablet Take 2 tablets (4 mg total) by mouth 4 (four) times daily as needed for diarrhea or loose stools. 04/05/18   Sharman Cheek, MD  ondansetron (ZOFRAN ODT) 4 MG disintegrating tablet Take 1 tablet (4 mg total) by mouth every 8 (eight) hours as needed for nausea or vomiting. 04/05/18   Sharman Cheek, MD    Allergies    Patient has no known allergies.  Review of Systems   Review of Systems  Psychiatric/Behavioral: Positive for confusion.  All other systems reviewed and are negative.   Physical Exam Updated Vital Signs BP 109/69   Pulse 70   Temp 98 F (36.7 C)   Resp 17   SpO2 97%   Physical Exam Vitals and nursing note reviewed.  Constitutional:      Comments: Closing his eyes and not answering questions  HENT:     Head: Normocephalic.     Comments: No signs of head trauma    Nose: Nose normal.     Mouth/Throat:     Mouth: Mucous membranes are moist.  Eyes:     Comments: Pupils are not pinpoint.  Cardiovascular:     Rate and Rhythm: Normal rate and regular rhythm.  Pulses: Normal pulses.     Heart sounds: Normal heart sounds.  Pulmonary:     Effort: Pulmonary effort is normal.     Breath sounds: Normal breath sounds.  Abdominal:     General: Abdomen is flat.     Palpations: Abdomen is soft.  Musculoskeletal:        General: Normal range of motion.     Cervical back: Normal range of motion.  Skin:    General: Skin is warm.     Capillary Refill: Capillary refill takes less than 2 seconds.  Neurological:     Comments: Not following commands but patient is moving all extremities   Psychiatric:     Comments: Unable      ED Results / Procedures / Treatments   Labs (all labs ordered are listed, but only abnormal results are displayed) Labs Reviewed  COMPREHENSIVE METABOLIC PANEL - Abnormal; Notable for the following components:      Result  Value   CO2 21 (*)    Creatinine, Ser 1.25 (*)    Total Protein 6.4 (*)    Total Bilirubin 0.2 (*)    All other components within normal limits  SALICYLATE LEVEL - Abnormal; Notable for the following components:   Salicylate Lvl <7.0 (*)    All other components within normal limits  ACETAMINOPHEN LEVEL - Abnormal; Notable for the following components:   Acetaminophen (Tylenol), Serum <10 (*)    All other components within normal limits  CBG MONITORING, ED - Abnormal; Notable for the following components:   Glucose-Capillary 101 (*)    All other components within normal limits  RESPIRATORY PANEL BY RT PCR (FLU A&B, COVID)  CBC WITH DIFFERENTIAL/PLATELET  ETHANOL  RAPID URINE DRUG SCREEN, HOSP PERFORMED    EKG EKG Interpretation  Date/Time:  Tuesday April 18 2020 17:59:20 EDT Ventricular Rate:  91 PR Interval:    QRS Duration: 93 QT Interval:  377 QTC Calculation: 464 R Axis:   91 Text Interpretation: Sinus rhythm Borderline right axis deviation Abnormal R-wave progression, early transition No significant change since last tracing Confirmed by Richardean Canal ([id number]) on 04/18/2020 7:56:24 PM   Radiology CT Head Wo Contrast  Result Date: 04/18/2020 CLINICAL DATA:  Seizure, nontraumatic. EXAM: CT HEAD WITHOUT CONTRAST TECHNIQUE: Contiguous axial images were obtained from the base of the skull through the vertex without intravenous contrast. COMPARISON:  Report from head CT 02/11/2003 (images unavailable). FINDINGS: Brain: Cerebral volume is normal. There is no acute intracranial hemorrhage. No demarcated cortical infarct. No extra-axial fluid collection. No evidence of intracranial mass. No midline shift. Vascular: No hyperdense vessel. Skull: Normal. Negative for fracture or focal lesion. Sinuses/Orbits: Visualized orbits show no acute finding. Mild ethmoid sinus mucosal thickening. No significant mastoid effusion. IMPRESSION: No evidence of acute intracranial abnormality. Mild  ethmoid sinus mucosal thickening. Electronically Signed   By: Jackey Loge DO   On: 04/18/2020 19:02   DG Chest Port 1 View  Result Date: 04/18/2020 CLINICAL DATA:  Altered mental status and seizure. EXAM: PORTABLE CHEST 1 VIEW COMPARISON:  April 05, 2018 FINDINGS: The heart size and mediastinal contours are within normal limits. Both lungs are clear. A radiopaque fusion plate and screws are seen overlying the cervical spine. IMPRESSION: No acute cardiopulmonary disease. Electronically Signed   By: Aram Candela M.D.   On: 04/18/2020 18:23    Procedures Procedures (including critical care time)  Medications Ordered in ED Medications  naloxone (NARCAN) injection 0.4 mg (has no administration in time  range)  sodium chloride 0.9 % bolus 1,000 mL (1,000 mLs Intravenous New Bag/Given 04/18/20 1759)    ED Course  I have reviewed the triage vital signs and the nursing notes.  Pertinent labs & imaging results that were available during my care of the patient were reviewed by me and considered in my medical decision making (see chart for details).    MDM Rules/Calculators/A&P                         Ronald Butler is a 44 y.o. male here presenting with possible seizure.  He has no history of seizure and coincidentally, patient had seizure-like activity when he was arrested.  Patient appears altered but when I try to examine him, he is trying to close his eyes actively.  Patient also asked as not to give Narcan.  Plan to get CBC, CMP, tox levels.  We will also get CT head as well and chest x-Hasson.  8:50 PM CT head and chest x-Gianny and labs unremarkable.  Patient initially refused to get up and was given 1 dose of Narcan.  He then was more awake and I ordered another dose of Narcan.  Patient asked [place] not to give Narcan.  I suspect heroin overdose.  Patient did not have any seizure activity in the ED.  Patient also did not provide any urinalysis to confirm heroin in his system but he has a history of  heroin abuse.   Final Clinical Impression(s) / ED Diagnoses Final diagnoses:  None    Rx / DC Orders ED Discharge Orders    None       Charlynne Pander, MD 04/18/20 2051

## 2020-04-18 NOTE — ED Notes (Signed)
Pt able to speak in clear sentences to voice needs, pt refusing to speak with this RN or [employer] at bedside.

## 2020-04-18 NOTE — ED Provider Notes (Signed)
[facility]-EMERGENCY DEPT Provider Note   CSN: [id number] Arrival date & time: 04/18/20  1507     History Chief Complaint  Patient presents with  . Shortness of Breath    Ronald Butler is a 44 y.o. male.  HPI  HPI will be deferred due to being under police custody as he became a danger to harm to hospital staff.   Patient with significant medical history of pneumothorax, seizures and polysubstance abuse presents to the emergency department in police custody due to shortness of breath.  From triage note it appears that patient had shortness of breath shortly after being arrested, patient was evaluated by EMS  whom found that his lungs were clear bilaterally.  I was unable to obtain HPI as patient was being detained by police officers.  He started to act erratic and placed hospital staff staff in danger.  Past Medical History:  Diagnosis Date  . Pancreatitis   . Pneumothorax   . Seizures [facility])     Patient Active Problem List   Diagnosis Date Noted  . 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 APPENDECTOMY    . CHOLECYSTECTOMY         Family History  Family history unknown: Yes    Social History   Tobacco Use  . Smoking status: Current Every Day Smoker    Packs/day: 2.00    Types: Cigarettes  . Smokeless tobacco: Never Used  Vaping Use  . Vaping Use: Never used  Substance Use Topics  . Alcohol use: Yes    Comment: occassional  . Drug use: Yes    Types: Marijuana    Home Medications Prior to Admission medications   Medication Sig Start Date End Date Taking? Authorizing Provider  famotidine (PEPCID) 20 MG tablet Take 1 tablet (20 mg total) by mouth 2 (two) times daily. 01/08/18 01/08/19  Merrily Brittle, MD  ibuprofen (ADVIL,MOTRIN) 200 MG tablet Take 600-800 mg by mouth every 6 (six) hours as needed for fever, headache, mild pain, moderate pain or cramping.    [provider]  loperamide (IMODIUM A-D) 2 MG tablet Take 2 tablets (4 mg total) by mouth 4 (four) times daily as needed for diarrhea or loose stools. 04/05/18   Sharman Cheek, MD  ondansetron (ZOFRAN ODT) 4 MG disintegrating tablet Take 1 tablet (4 mg total) by mouth every 8 (eight) hours as needed for nausea or vomiting. 04/05/18   Sharman Cheek, MD    Allergies    Patient has no known allergies.  Review of Systems   Review of Systems  Unable to perform ROS: Intubated    Physical Exam Updated Vital Signs BP (!) 118/104 (BP Location: Left Arm) Comment: patient want stop moving arm  Pulse (!) 103   Temp 99.5 F (37.5 C) (Oral)   Ht 5\' 11"  (1.803 m)   Wt 81.6 kg   SpO2 100% Comment: Simultaneous filing. User may not have seen previous data.  BMI 25.10 kg/m   Physical Exam Vitals and nursing note reviewed.  Constitutional:      General: He is not in acute distress.    Appearance: Normal appearance. He is not ill-appearing or diaphoretic.  HENT:     Head: Normocephalic and atraumatic.     Nose: No congestion or rhinorrhea.  Eyes:     General: No scleral icterus.       Right eye: No discharge.  Left eye: No discharge.     Conjunctiva/sclera: Conjunctivae normal.  Cardiovascular:     Rate and Rhythm: Regular rhythm. Tachycardia present.     Heart sounds: No murmur heard.  No friction rub. No gallop.   Pulmonary:     Effort: Pulmonary effort is normal. No respiratory distress.     Breath sounds: Normal breath sounds. No stridor. No wheezing, rhonchi or rales.  Chest:     Chest wall: No tenderness.  Abdominal:     General: There is no distension.     Palpations: Abdomen is soft.     Tenderness: There is no abdominal tenderness. There is no guarding.  Musculoskeletal:     Cervical back: Neck supple.     Right lower leg: No edema.     Left lower leg: No edema.  Skin:    General: Skin is warm and dry.     Capillary Refill: Capillary refill takes less than 2  seconds.     Coloration: Skin is not jaundiced or pale.  Neurological:     General: No focal deficit present.  Psychiatric:        Mood and Affect: Mood normal.     ED Results / Procedures / Treatments   Labs (all labs ordered are listed, but only abnormal results are displayed) Labs Reviewed - No data to display  EKG None  Radiology CT Head Wo Contrast  Result Date: 04/18/2020 CLINICAL DATA:  Seizure, nontraumatic. EXAM: CT HEAD WITHOUT CONTRAST TECHNIQUE: Contiguous axial images were obtained from the base of the skull through the vertex without intravenous contrast. COMPARISON:  Report from head CT 02/11/2003 (images unavailable). FINDINGS: Brain: Cerebral volume is normal. There is no acute intracranial hemorrhage. No demarcated cortical infarct. No extra-axial fluid collection. No evidence of intracranial mass. No midline shift. Vascular: No hyperdense vessel. Skull: Normal. Negative for fracture or focal lesion. Sinuses/Orbits: Visualized orbits show no acute finding. Mild ethmoid sinus mucosal thickening. No significant mastoid effusion. IMPRESSION: No evidence of acute intracranial abnormality. Mild ethmoid sinus mucosal thickening. Electronically Signed   By: Jackey Loge DO   On: 04/18/2020 19:02   DG Chest Port 1 View  Result Date: 04/18/2020 CLINICAL DATA:  Altered mental status and seizure. EXAM: PORTABLE CHEST 1 VIEW COMPARISON:  April 05, 2018 FINDINGS: The heart size and mediastinal contours are within normal limits. Both lungs are clear. A radiopaque fusion plate and screws are seen overlying the cervical spine. IMPRESSION: No acute cardiopulmonary disease. Electronically Signed   By: Aram Candela M.D.   On: 04/18/2020 18:23    Procedures Procedures (including critical care time)  Medications Ordered in ED Medications - No data to display  ED Course  I have reviewed the triage vital signs and the nursing notes.  Pertinent labs & imaging results that were  available during my care of the patient were reviewed by me and considered in my medical decision making (see chart for details).    MDM Rules/Calculators/A&P                          I have personally reviewed all imaging, labs and have interpreted them.  Patient presents with shortness of breath.  On exam he was somnolent, vital signs reassuring.  Nursing staff notified me that patient is difficult to wake up and asked me to come evaluate.  Upon arrival,  patient was laying on the stretcher and was snoring.  There is no signs of  respiratory distress, patient was controlling his airway without difficulty, lung sounds are clear bilaterally.  Tried to awake the patient with sternal rubs and he would awake to pain.  As we were collecting vital signs patient woke up and became extremely combative.  He started swinging his arms and yelling,  placing hospital staff in danger.  Police came and restrained the patient.  I have low suspicion for systemic infection as patient is nontoxic-appearing, vital signs reassuring, no obvious source infection on exam.  Low suspicion for pneumothorax or PE as vital signs are reassuring, patient has low risk factors, no history of DVT or PEs not on hormone therapy no recent surgeries, lung sounds were clear bilaterally.  History is also atypical as patient became "short of breath" after being arrested. Low suspicion for ACS as there was no signs of hypoperfusing or fluid overload noted on exam, vital signs reassuring no cardiac history.  Low Suspicion for intra-abdominal abnormality as abdomen soft nontender to palpation no acute abdomen noted on exam.  I suspect patient became short of breath after being arrested.  Will recommend he follows up with PCP for further evaluation.  Vital signs have remained stable, no indication for hospital admission.  Patient discussed with attending and they agreed with assessment and plan.  Patient given at home care as well strict return  precautions.  Patient verbalized that they understood agreed to said plan.    Final Clinical Impression(s) / ED Diagnoses Final diagnoses:  Shortness of breath    Rx / DC Orders ED Discharge Orders    None       Carroll Sage, PA-C 04/18/20 2042    Milagros Loll, MD 04/19/20 [phone]

## 2020-04-18 NOTE — ED Notes (Signed)
Pt brought to res a due to unresponsiveness. After pt was uncuffed by [employer], IV access was trying to be obtained. PT began spitting on the ground and coughing. Pt then jumped out of the bed, became aggressive and fighting. [employer] at bedside. [employer] took control of the situation. Pt required forensic restraints. Dykstra MD to pt bedside to clear pt. Pt medically cleared and DC'ed at this time in police custody. Pt awake and verbal upon discharge. Pt escorted out in [facility] in police custody.

## 2020-04-18 NOTE — ED Triage Notes (Signed)
Per EMS- patient is in police custody.  Patient reports that he had slight SOB this AM when he woke, but increased when he was placed under arrest. Patient c/o right upper rib cage and back pain. Patient also reports a history of pneumothorax.  Per EMS- Lungs clear bilaterally and room air sats 99 %

## 2020-04-22 ENCOUNTER — Other Ambulatory Visit: Payer: Self-pay

## 2020-04-22 ENCOUNTER — Encounter (HOSPITAL_COMMUNITY): Payer: Self-pay

## 2020-04-22 ENCOUNTER — Emergency Department (HOSPITAL_COMMUNITY)
Admission: EM | Admit: 2020-04-22 | Discharge: 2020-04-23 | Disposition: A | Discharge status: Home / Self Care | Payer: Self-pay | Attending: Emergency Medicine | Admitting: Emergency Medicine

## 2020-04-22 DIAGNOSIS — F17210 Nicotine dependence, cigarettes, uncomplicated: Secondary | ICD-10-CM | POA: Insufficient documentation

## 2020-04-22 DIAGNOSIS — R569 Unspecified convulsions: Secondary | ICD-10-CM | POA: Insufficient documentation

## 2020-04-22 LAB — CBG MONITORING, ED: Glucose-Capillary: 103 mg/dL — ABNORMAL HIGH (ref 70–99)

## 2020-04-22 NOTE — ED Triage Notes (Signed)
Pt coming from jail with EMS after witnessed seizure. Pt reports hx of seizures and out of Depakote. Total of 3 seizures today, 1 witnessed by fire dep. Pt c/o HA at this time.

## 2020-04-23 ENCOUNTER — Emergency Department (HOSPITAL_COMMUNITY)
Admission: EM | Admit: 2020-04-23 | Discharge: 2020-04-24 | Disposition: A | Discharge status: Home / Self Care | Source: Home / Self Care | Attending: Emergency Medicine | Admitting: Emergency Medicine

## 2020-04-23 ENCOUNTER — Encounter (HOSPITAL_COMMUNITY): Payer: Self-pay

## 2020-04-23 ENCOUNTER — Emergency Department (HOSPITAL_COMMUNITY): Payer: Self-pay

## 2020-04-23 DIAGNOSIS — F445 Conversion disorder with seizures or convulsions: Secondary | ICD-10-CM

## 2020-04-23 DIAGNOSIS — Z79899 Other long term (current) drug therapy: Secondary | ICD-10-CM | POA: Insufficient documentation

## 2020-04-23 DIAGNOSIS — F17210 Nicotine dependence, cigarettes, uncomplicated: Secondary | ICD-10-CM | POA: Insufficient documentation

## 2020-04-23 DIAGNOSIS — G40909 Epilepsy, unspecified, not intractable, without status epilepticus: Secondary | ICD-10-CM | POA: Diagnosis not present

## 2020-04-23 DIAGNOSIS — R569 Unspecified convulsions: Secondary | ICD-10-CM | POA: Insufficient documentation

## 2020-04-23 LAB — CBC
HCT: 42.4 % (ref 39.0–52.0)
Hemoglobin: 14.3 g/dL (ref 13.0–17.0)
MCH: 30.1 pg (ref 26.0–34.0)
MCHC: 33.7 g/dL (ref 30.0–36.0)
MCV: 89.3 fL (ref 80.0–100.0)
Platelets: 243 10*3/uL (ref 150–400)
RBC: 4.75 MIL/uL (ref 4.22–5.81)
RDW: 12.8 % (ref 11.5–15.5)
WBC: 8.8 10*3/uL (ref 4.0–10.5)
nRBC: 0 % (ref 0.0–0.2)

## 2020-04-23 LAB — BASIC METABOLIC PANEL
Anion gap: 11 (ref 5–15)
BUN: 16 mg/dL (ref 6–20)
CO2: 21 mmol/L — ABNORMAL LOW (ref 22–32)
Calcium: 9.1 mg/dL (ref 8.9–10.3)
Chloride: 106 mmol/L (ref 98–111)
Creatinine, Ser: 0.98 mg/dL (ref 0.61–1.24)
GFR, Estimated: >60 mL/min (ref >60)
Glucose, Bld: 113 mg/dL — ABNORMAL HIGH (ref 70–99)
Potassium: 3.6 mmol/L (ref 3.5–5.1)
Sodium: 138 mmol/L (ref 135–145)

## 2020-04-23 LAB — VALPROIC ACID LEVEL: Valproic Acid Lvl: <10 ug/mL — ABNORMAL LOW (ref 50.0–100.0)

## 2020-04-23 MED ORDER — SODIUM CHLORIDE 0.9 % IV BOLUS (SEPSIS)
1000.0000 mL | Freq: Once | INTRAVENOUS | Status: AC
  Administered 2020-04-23: 1000 mL via INTRAVENOUS

## 2020-04-23 MED ORDER — DIVALPROEX SODIUM ER 500 MG PO TB24: 500.0000 mg | ORAL_TABLET | Freq: Every day | ORAL | 0 refills | Status: DC

## 2020-04-23 MED ORDER — SODIUM CHLORIDE 0.9 % IV SOLN
1000.0000 mL | INTRAVENOUS | Status: DC
  Administered 2020-04-23: 1000 mL via INTRAVENOUS

## 2020-04-23 MED ORDER — ACETAMINOPHEN 325 MG PO TABS
650.0000 mg | ORAL_TABLET | Freq: Once | ORAL | Status: AC
  Administered 2020-04-23: 650 mg via ORAL
  Filled 2020-04-23: qty 2

## 2020-04-23 MED ORDER — ACETAMINOPHEN 500 MG PO TABS
1000.0000 mg | ORAL_TABLET | Freq: Once | ORAL | Status: AC
  Administered 2020-04-23: 1000 mg via ORAL
  Filled 2020-04-23: qty 2

## 2020-04-23 MED ORDER — IBUPROFEN 400 MG PO TABS
600.0000 mg | ORAL_TABLET | Freq: Once | ORAL | Status: AC
  Administered 2020-04-23: 600 mg via ORAL
  Filled 2020-04-23: qty 1

## 2020-04-23 MED ORDER — LEVETIRACETAM IN NACL 1000 MG/100ML IV SOLN
1000.0000 mg | Freq: Once | INTRAVENOUS | Status: AC
  Administered 2020-04-23: 1000 mg via INTRAVENOUS
  Filled 2020-04-23: qty 100

## 2020-04-23 NOTE — ED Provider Notes (Signed)
[facility] EMERGENCY DEPARTMENT Provider Note   CSN: [id number] Arrival date & time: 04/22/20  2332     History Chief Complaint  Patient presents with  . Seizures    Ronald Butler is a 44 y.o. male.  Patient is a 44 year old male who presents after seizures.  He is currently in jail.  He had 3 witnessed seizures today by the jail staff.  They reported that he had brief seizures each lasting about a minute or so and each having a postictal episode after.  His last seizure prompted EMS activation.  He was incontinent of urine.  He did not bite his tongue.  He does have a known history of seizures.  He states that he has previously been on Depakote but has been out of it for about the last week.  He says that his seizures were well controlled on Depakote but since he has been off the Depakote he has had 1 other seizure yesterday in addition to the ones today.  He denies any fevers or other recent illnesses.  He does have a headache following the seizures.  He also complains of pain in his right ribs and is not sure if he injured them during the seizures.        Past Medical History:  Diagnosis Date  . Pancreatitis   . Pneumothorax   . Seizures [facility])     Patient Active Problem List   Diagnosis Date Noted  . 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 APPENDECTOMY    . CHOLECYSTECTOMY         Family History  Family history unknown: Yes    Social History   Tobacco Use  . Smoking status: Current Every Day Smoker    Packs/day: 2.00    Types: Cigarettes  . Smokeless tobacco: Never Used  Vaping Use  . Vaping Use: Never used  Substance Use Topics  . Alcohol use: Not Currently    Comment: occassional  . Drug use: Not Currently    Types: Marijuana    Home Medications Prior to Admission medications   Medication Sig Start Date End Date Taking? Authorizing Provider  divalproex (DEPAKOTE ER)  500 MG 24 hr tablet Take 1 tablet (500 mg total) by mouth daily. 04/23/20   Rolan Bucco, MD  famotidine (PEPCID) 20 MG tablet Take 1 tablet (20 mg total) by mouth 2 (two) times daily. 01/08/18 01/08/19  Merrily Brittle, MD  ibuprofen (ADVIL,MOTRIN) 200 MG tablet Take 600-800 mg by mouth every 6 (six) hours as needed for fever, headache, mild pain, moderate pain or cramping.    [provider]  loperamide (IMODIUM A-D) 2 MG tablet Take 2 tablets (4 mg total) by mouth 4 (four) times daily as needed for diarrhea or loose stools. 04/05/18   Sharman Cheek, MD  ondansetron (ZOFRAN ODT) 4 MG disintegrating tablet Take 1 tablet (4 mg total) by mouth every 8 (eight) hours as needed for nausea or vomiting. 04/05/18   Sharman Cheek, MD    Allergies    Patient has no known allergies.  Review of Systems   Review of Systems  Constitutional: Negative for chills, diaphoresis, fatigue and fever.  HENT: Negative for congestion, rhinorrhea and sneezing.   Eyes: Negative.   Respiratory: Negative for cough, chest tightness and shortness of breath.   Cardiovascular: Positive for chest pain (Right rib pain). Negative for leg swelling.  Gastrointestinal: Negative for abdominal pain, blood in stool,  diarrhea, nausea and vomiting.  Genitourinary: Negative for difficulty urinating, flank pain, frequency and hematuria.  Musculoskeletal: Negative for arthralgias and back pain.  Skin: Negative for rash.  Neurological: Positive for seizures and headaches. Negative for dizziness, speech difficulty, weakness and numbness.    Physical Exam Updated Vital Signs BP 117/83   Pulse 66   Temp 98.7 F (37.1 C) (Oral)   Resp (!) 29   Ht 5\' 11"  (1.803 m)   Wt 74.8 kg   SpO2 96%   BMI 23.01 kg/m   Physical Exam Constitutional:      Appearance: He is well-developed.  HENT:     Head: Normocephalic and atraumatic.     Mouth/Throat:     Comments: No tongue lacerations Eyes:     Pupils: Pupils are equal,  round, and reactive to light.  Cardiovascular:     Rate and Rhythm: Normal rate and regular rhythm.     Heart sounds: Normal heart sounds.  Pulmonary:     Effort: Pulmonary effort is normal. No respiratory distress.     Breath sounds: Normal breath sounds. No wheezing or rales.  Chest:     Chest wall: Tenderness (Tenderness to the right lateral ribs, no crepitus or deformity, no visible trauma noted.) present.  Abdominal:     General: Bowel sounds are normal.     Palpations: Abdomen is soft.     Tenderness: There is no abdominal tenderness. There is no guarding or rebound.  Musculoskeletal:        General: Normal range of motion.     Cervical back: Normal range of motion and neck supple.  Lymphadenopathy:     Cervical: No cervical adenopathy.  Skin:    General: Skin is warm and dry.     Findings: No rash.  Neurological:     General: No focal deficit present.     Mental Status: He is alert and oriented to person, place, and time.     ED Results / Procedures / Treatments   Labs (all labs ordered are listed, but only abnormal results are displayed) Labs Reviewed  CBG MONITORING, ED - Abnormal; Notable for the following components:      Result Value   Glucose-Capillary 103 (*)    All other components within normal limits    EKG EKG Interpretation  Date/Time:  Saturday April 22 2020 23:38:22 EDT Ventricular Rate:  72 PR Interval:    QRS Duration: 95 QT Interval:  467 QTC Calculation: 512 R Axis:   83 Text Interpretation: Sinus rhythm Borderline short PR interval Borderline T wave abnormalities Prolonged QT interval Confirmed by [id number] [phone]) on 04/22/2020 11:57:32 PM   Radiology DG Ribs Unilateral W/Chest Right  Result Date: 04/23/2020 CLINICAL DATA:  Fall, right rib pain EXAM: RIGHT RIBS AND CHEST - 3+ VIEW COMPARISON:  None. FINDINGS: No fracture or other bone lesions are seen involving the ribs. There is no evidence of pneumothorax or pleural effusion.  Both lungs are clear. Heart size and mediastinal contours are within normal limits. IMPRESSION: Negative. Electronically Signed   By: 04/25/2020 M.D.   On: 04/23/2020 01:06    Procedures Procedures (including critical care time)  Medications Ordered in ED Medications  levETIRAcetam (KEPPRA) IVPB 1000 mg/100 mL premix (0 mg Intravenous Stopped 04/23/20 0137)  ibuprofen (ADVIL) tablet 600 mg (600 mg Oral Given 04/23/20 0035)    ED Course  I have reviewed the triage vital signs and the nursing notes.  Pertinent labs & imaging results  that were available during my care of the patient were reviewed by me and considered in my medical decision making (see chart for details).    MDM Rules/Calculators/A&P                          Patient presents after earlier seizure activity.  He is back to baseline and has been monitored in the ED for several hours without any seizure activity.  He reports a history of seizures and was previously on Depakote which he stopped taking about a week ago per his report.  He does not have any apparent injuries from the seizure.  He had some rib pain but x-rays do not reveal any underlying fracture or pneumothorax.  He is neurologically intact.  He is discharged back to jail in good condition.  He was given a prescription for Depakote.  Return precautions were given. Final Clinical Impression(s) / ED Diagnoses Final diagnoses:  Seizure (HCC)    Rx / DC Orders ED Discharge Orders         Ordered    divalproex (DEPAKOTE ER) 500 MG 24 hr tablet  Daily        04/23/20 0347           Rolan Bucco, MD 04/23/20 [phone]

## 2020-04-23 NOTE — ED Triage Notes (Addendum)
Assume care from EMS, Pt had one witness seizure today at jail & was restarted on Depakote today. EMS states pt was seen here yesterday same s/s. Police officer states pt was already laying down when seizure occur no hitting head   130/88 Hr 74 O2 98% ra

## 2020-04-24 ENCOUNTER — Encounter (HOSPITAL_COMMUNITY): Payer: Self-pay | Admitting: Student

## 2020-04-24 ENCOUNTER — Other Ambulatory Visit: Payer: Self-pay

## 2020-04-24 ENCOUNTER — Emergency Department (HOSPITAL_COMMUNITY)
Admission: EM | Admit: 2020-04-24 | Discharge: 2020-04-25 | Source: Home / Self Care | Attending: Emergency Medicine | Admitting: Emergency Medicine

## 2020-04-24 DIAGNOSIS — F17210 Nicotine dependence, cigarettes, uncomplicated: Secondary | ICD-10-CM | POA: Insufficient documentation

## 2020-04-24 DIAGNOSIS — R569 Unspecified convulsions: Secondary | ICD-10-CM

## 2020-04-24 DIAGNOSIS — R110 Nausea: Secondary | ICD-10-CM | POA: Insufficient documentation

## 2020-04-24 LAB — COMPREHENSIVE METABOLIC PANEL
ALT: 11 U/L (ref 0–44)
AST: 10 U/L — ABNORMAL LOW (ref 15–41)
Albumin: 4.7 g/dL (ref 3.5–5.0)
Alkaline Phosphatase: 66 U/L (ref 38–126)
Anion gap: 9 (ref 5–15)
BUN: 12 mg/dL (ref 6–20)
CO2: 25 mmol/L (ref 22–32)
Calcium: 9.6 mg/dL (ref 8.9–10.3)
Chloride: 104 mmol/L (ref 98–111)
Creatinine, Ser: 0.98 mg/dL (ref 0.61–1.24)
GFR, Estimated: >60 mL/min (ref >60)
Glucose, Bld: 100 mg/dL — ABNORMAL HIGH (ref 70–99)
Potassium: 3.9 mmol/L (ref 3.5–5.1)
Sodium: 138 mmol/L (ref 135–145)
Total Bilirubin: 0.8 mg/dL (ref 0.3–1.2)
Total Protein: 7.8 g/dL (ref 6.5–8.1)

## 2020-04-24 LAB — CBC
HCT: 45.8 % (ref 39.0–52.0)
Hemoglobin: 15.8 g/dL (ref 13.0–17.0)
MCH: 30.9 pg (ref 26.0–34.0)
MCHC: 34.5 g/dL (ref 30.0–36.0)
MCV: 89.6 fL (ref 80.0–100.0)
Platelets: 238 10*3/uL (ref 150–400)
RBC: 5.11 MIL/uL (ref 4.22–5.81)
RDW: 12.8 % (ref 11.5–15.5)
WBC: 8.3 10*3/uL (ref 4.0–10.5)
nRBC: 0 % (ref 0.0–0.2)

## 2020-04-24 LAB — CK: Total CK: 39 U/L — ABNORMAL LOW (ref 49–397)

## 2020-04-24 MED ORDER — ACETAMINOPHEN 325 MG PO TABS
650.0000 mg | ORAL_TABLET | Freq: Once | ORAL | Status: AC
  Administered 2020-04-25: 650 mg via ORAL
  Filled 2020-04-24: qty 2

## 2020-04-24 MED ORDER — ACETAMINOPHEN 325 MG PO TABS: 650.0000 mg | ORAL_TABLET | Freq: Once | ORAL | Status: DC

## 2020-04-24 MED ORDER — SODIUM CHLORIDE 0.9 % IV BOLUS
1000.0000 mL | Freq: Once | INTRAVENOUS | Status: AC
  Administered 2020-04-24: 1000 mL via INTRAVENOUS

## 2020-04-24 MED ORDER — DIVALPROEX SODIUM ER 500 MG PO TB24: 500.0000 mg | ORAL_TABLET | Freq: Every day | ORAL | Status: DC

## 2020-04-24 MED ORDER — DIVALPROEX SODIUM ER 500 MG PO TB24
500.0000 mg | ORAL_TABLET | Freq: Every day | ORAL | Status: DC
  Administered 2020-04-24: 500 mg via ORAL
  Filled 2020-04-24: qty 1

## 2020-04-24 MED ORDER — IBUPROFEN 400 MG PO TABS
600.0000 mg | ORAL_TABLET | Freq: Once | ORAL | Status: AC
  Administered 2020-04-24: 600 mg via ORAL
  Filled 2020-04-24: qty 1

## 2020-04-24 NOTE — ED Provider Notes (Addendum)
[facility]-EMERGENCY DEPT Provider Note   CSN: [id number] Arrival date & time: 04/24/20  2148     History Chief Complaint  Patient presents with  . Seizures    Lynden Belinsky is a 44 y.o. male with a history of seizures who presents to the ED with complaints of seizure activity today. Per patient he was told he had 2 seizures at the facility today, staff at bedside described tonic/clonic movement that lasted 30 seconds then the 2nd lasted about 1 minute.  No known head injury.  He had an episode of incontinence but no tongue injury. He currently is feeling similar to how he does post ictally with generalized body aches and mild nausea. Has had multiple seizures w/ ED visits in the past 1 week. He states he is not receiving his Depakote that has been prescribed at prior ED visits. He denies fever, visual disturbance, numbness, weakness, chest pain, or abdominal pain. Denies alcohol or drug use.   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   HPI     Past Medical History:  Diagnosis Date  . Pancreatitis   . Pneumothorax   . Seizures [facility])     Patient Active Problem List   Diagnosis Date Noted  . 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 APPENDECTOMY    . CHOLECYSTECTOMY         Family History  Family history unknown: Yes    Social History   Tobacco Use  . Smoking status: Current Every Day Smoker    Packs/day: 2.00    Types: Cigarettes  . Smokeless tobacco: Never Used  Vaping Use  . Vaping Use: Never used  Substance Use Topics  . Alcohol use: Not Currently    Comment: occassional  . Drug use: Not Currently    Types: Marijuana    Home Medications Prior to Admission medications   Medication Sig Start Date End Date Taking? Authorizing Provider  divalproex (DEPAKOTE ER) 500 MG 24 hr tablet Take  1 tablet (500 mg total) by mouth daily. 04/23/20   Rolan Bucco, MD  famotidine (PEPCID) 20 MG tablet Take 1 tablet (20 mg total) by mouth 2 (two) times daily. 01/08/18 01/08/19  Merrily Brittle, MD  ibuprofen (ADVIL,MOTRIN) 200 MG tablet Take 600-800 mg by mouth every 6 (six) hours as needed for fever, headache, mild pain, moderate pain or cramping.    [provider]  loperamide (IMODIUM A-D) 2 MG tablet Take 2 tablets (4 mg total) by mouth 4 (four) times daily as needed for diarrhea or loose stools. 04/05/18   Sharman Cheek, MD  ondansetron (ZOFRAN ODT) 4 MG disintegrating tablet Take 1 tablet (4 mg total) by mouth every 8 (eight) hours as needed for nausea or vomiting. 04/05/18   Sharman Cheek, MD    Allergies    Patient has no known allergies.  Review of Systems   Review of Systems  Constitutional: Negative for chills and fever.  Respiratory: Negative for cough.   Cardiovascular: Negative for chest pain.  Gastrointestinal: Positive for nausea. Negative for abdominal pain, blood in stool and diarrhea.  Genitourinary: Negative for dysuria.  Musculoskeletal: Positive for arthralgias and myalgias.  Neurological: Positive for seizures and headaches. Negative for facial asymmetry, speech difficulty, weakness and numbness.  All other systems reviewed and are negative.   Physical Exam Updated Vital Signs  BP 113/82   Pulse 84   Temp 99.2 F (37.3 C)   Resp (!) 23   Ht 5\' 11"  (1.803 m)   Wt 74.8 kg   SpO2 100%   BMI 23.00 kg/m   Physical Exam Vitals and nursing note reviewed.  Constitutional:      General: He is not in acute distress.    Appearance: He is well-developed. He is not toxic-appearing.  HENT:     Head: Normocephalic and atraumatic.     Comments: No raccoon eyes or battle sign.    Ears:     Comments: No hemotympanum.    Mouth/Throat:     Comments: Uvula midline.  No intraoral injuries noted. Eyes:     General:        Right eye: No discharge.         Left eye: No discharge.     Extraocular Movements: Extraocular movements intact.     Conjunctiva/sclera: Conjunctivae normal.     Pupils: Pupils are equal, round, and reactive to light.  Neck:     Comments: No point/focal vertebral tenderness. Cardiovascular:     Rate and Rhythm: Normal rate and regular rhythm.     Comments: 2+ symmetric radial and DP pulses. Pulmonary:     Effort: Pulmonary effort is normal. No respiratory distress.     Breath sounds: Normal breath sounds. No wheezing, rhonchi or rales.  Chest:     Chest wall: No tenderness.  Abdominal:     General: There is no distension.     Palpations: Abdomen is soft.     Tenderness: There is no abdominal tenderness. There is no guarding or rebound.  Musculoskeletal:     Cervical back: Normal range of motion and neck supple. No rigidity.     Comments: Moving all extremities appropriately.  No focal bony tenderness.  All compartments are soft.  Skin:    General: Skin is warm and dry.     Findings: No rash.  Neurological:     Mental Status: He is alert.     Comments: Clear speech.  CN III through XII grossly intact.  Sensation grossly intact bilateral upper and lower extremities.  5 out of 5 symmetric grip strength.  5-5 strength with plantar dorsiflexion bilaterally.  Psychiatric:        Behavior: Behavior normal.     ED Results / Procedures / Treatments   Labs (all labs ordered are listed, but only abnormal results are displayed) Labs Reviewed  COMPREHENSIVE METABOLIC PANEL - Abnormal; Notable for the following components:      Result Value   Glucose, Bld 100 (*)    AST 10 (*)    All other components within normal limits  CK - Abnormal; Notable for the following components:   Total CK 39 (*)    All other components within normal limits  CBC    EKG EKG Interpretation  Date/Time:  Monday April 24 2020 22:55:43 EDT Ventricular Rate:  64 PR Interval:    QRS Duration: 92 QT Interval:  427 QTC  Calculation: 441 R Axis:   89 Text Interpretation: Sinus rhythm Borderline T wave abnormalities No STEMI Confirmed by [id number] [phone]) on 04/24/2020 11:03:36 PM   Radiology DG Ribs Unilateral W/Chest Right  Result Date: 04/23/2020 CLINICAL DATA:  Fall, right rib pain EXAM: RIGHT RIBS AND CHEST - 3+ VIEW COMPARISON:  None. FINDINGS: No fracture or other bone lesions are seen involving the ribs. There is no evidence of pneumothorax or  pleural effusion. Both lungs are clear. Heart size and mediastinal contours are within normal limits. IMPRESSION: Negative. Electronically Signed   By: Jonna ClarkBindu  Avutu M.D.   On: 04/23/2020 01:06    Procedures Procedures (including critical care time)  Medications Ordered in ED Medications  divalproex (DEPAKOTE ER) 24 hr tablet 500 mg (500 mg Oral Given 04/24/20 2258)  acetaminophen (TYLENOL) tablet 650 mg (has no administration in time range)  sodium chloride 0.9 % bolus 1,000 mL (1,000 mLs Intravenous New Bag/Given (Non-Interop) 04/24/20 2307)    ED Course  I have reviewed the triage vital signs and the nursing notes.  Pertinent labs & imaging results that were available during my care of the patient were reviewed by me and considered in my medical decision making (see chart for details).    MDM Rules/Calculators/A&P                         Patient presents to the ED with complaints of seizure activity today.  Patient is nontoxic, resting comfortably, his vitals are without significant abnormality on my assessment, mild tachypnea noted by nursing staff is not observed on my exam.  Patient has no focal neurologic deficits.  He is afebrile without nuchal rigidity.  He does not appear to have any focal bony tenderness and is NVI distally in extremities x 4.  At mental status baseline.  Plan for basic labs, will obtain CK given his generalized body aches, and continue to observe in the emergency department.  Will give a dose of his oral Depakote here in the  ED, it appears that IV Depakote is currently in shortage in this facility.  Additional history obtained:  Additional history obtained from chart review as well as officers at bedside. EKG: No STEMI Lab Tests:  I Ordered, reviewed, and interpreted labs, which included:  CBC, CMP, and CK, grossly unremarkable.  No significant lecture light derangement or findings of rhabdomyolysis.  Patient's ED work-up overall reassuring.  No recurrence of seizure activity in the emergency department.  RN for this patient called [facility] facility and discussed with their healthcare team, they relay that they have his prescription, they did not give him a dose of his Depakote last night because he was in the emergency department- same situation tonight, they are aware that we have given a dose here in the ED and will start @ facility tomorrow.  Patient remains alert and oriented.  Tolerating PO. Appears appropriate for discharge home at this time. I discussed results, treatment plan, need for follow-up, and return precautions with the patient. Provided opportunity for questions, patient confirmed understanding and is in agreement with plan.   Findings and plan of care discussed with supervising physician Dr. Jacqulyn BathLong who is in agreement.   Portions of this note were generated with Scientist, clinical (histocompatibility and immunogenetics)Dragon dictation software. Dictation errors may occur despite best attempts at proofreading.  Final Clinical Impression(s) / ED Diagnoses Final diagnoses:  Seizure [facility](HCC)    Rx / DC Orders ED Discharge Orders    None       Emmali Karow, Pleas KochSamantha R, PA-C 04/25/20 0000  00:18: Per Nursing staff patient began to feel shaky, no witnessed seizure activity, on my re-assessment he is now spitting up some, plan for zofran & re-assessment. Hold discharge at this time.   Patient requested his tylenol w/ his zofran simultaneously, said he did not feel nauseous and was able to tolerate this PO without difficulty. Remains alert & oriented, no focal  neuro  deficits, repeat abdominal exam remains nontender w/o peritoneal signs. Will discharge.    Cherly Anderson, PA-C 04/25/20 0144    Maia Plan, MD 04/27/20 [phone]

## 2020-04-24 NOTE — Discharge Instructions (Addendum)
You were seen in the emergency department today for seizures.  Your work-up in the ER was overall reassuring.  We called and spoke with the facility and they are aware of your Depakote prescription and will start giving this to you tomorrow.  We gave you a dose of your prescription in the emergency department this evening.  Follow-up with primary care or neurology within 3 days.  Return to the ER for new or worsening symptoms or any other concerns.  No driving until you have followed up with neurology and been cleared.

## 2020-04-24 NOTE — ED Notes (Signed)
Pt d/c by MD, Pt is provided w/ d/c instructions and follow up care, Pt is out of the ED with police officers

## 2020-04-24 NOTE — ED Provider Notes (Signed)
[facility] EMERGENCY DEPARTMENT Provider Note  CSN: [id number] Arrival date & time: 04/23/20 2251  Chief Complaint(s) Seizures  HPI Ronald Butler is a 44 y.o. male with a reported history of seizures coming from jail for seizure activity.  Patient has been seen recently for recurrent seizure activity.  Was reportedly well controlled on Depakote but has been off of it for a while.  During the last visit the patient was restarted on Depakote.  Today patient had a seizure while in jail.  They report the patient was already lying on the floor.  They are unsure how long the seizure lasted.  Patient is currently at his baseline mental status.  There is no trauma during the seizure activity.  Patient is endorsing generalized body aches.  No other physical complaints.  HPI  Past Medical History Past Medical History:  Diagnosis Date  . Pancreatitis   . Pneumothorax   . Seizures [facility])    Patient Active Problem List   Diagnosis Date Noted  . 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 Authorizing Provider  divalproex (DEPAKOTE ER) 500 MG 24 hr tablet Take 1 tablet (500 mg total) by mouth daily. 04/23/20   Rolan Bucco, MD  famotidine (PEPCID) 20 MG tablet Take 1 tablet (20 mg total) by mouth 2 (two) times daily. 01/08/18 01/08/19  Merrily Brittle, MD  ibuprofen (ADVIL,MOTRIN) 200 MG tablet Take 600-800 mg by mouth every 6 (six) hours as needed for fever, headache, mild pain, moderate pain or cramping.    [provider]  loperamide (IMODIUM A-D) 2 MG tablet Take 2 tablets (4 mg total) by mouth 4 (four) times daily as needed for diarrhea or loose stools. 04/05/18   Sharman Cheek, MD  ondansetron (ZOFRAN ODT) 4 MG disintegrating tablet Take 1 tablet (4 mg total) by mouth every 8 (eight) hours as needed for nausea or vomiting. 04/05/18   Sharman Cheek, MD                                                                                                                                     Past Surgical History Past Surgical History:  Procedure Laterality Date  . APPENDECTOMY    . CHOLECYSTECTOMY     Family History Family History  Family history unknown: Yes    Social History Social History   Tobacco Use  . Smoking status: Current Every Day Smoker    Packs/day: 2.00    Types: Cigarettes  . Smokeless tobacco: Never Used  Vaping Use  . Vaping Use: Never used  Substance Use Topics  . Alcohol use: Not Currently    Comment: occassional  . Drug use: Not Currently    Types: Marijuana   Allergies Patient has no known allergies.  Review of Systems Review of Systems All other systems are reviewed and are negative for  acute change except as noted in the HPI  Physical Exam Vital Signs  I have reviewed the triage vital signs BP 116/79   Pulse 60   Temp 98.6 F (37 C) (Oral)   Resp (!) 25   SpO2 99%   Physical Exam Vitals reviewed.  Constitutional:      General: He is not in acute distress.    Appearance: He is well-developed. He is not diaphoretic.  HENT:     Head: Normocephalic and atraumatic.     Jaw: No trismus.     Right Ear: External ear normal.     Left Ear: External ear normal.     Nose: Nose normal.  Eyes:     General: No scleral icterus.    Conjunctiva/sclera: Conjunctivae normal.  Neck:     Trachea: Phonation normal.  Cardiovascular:     Rate and Rhythm: Normal rate and regular rhythm.  Pulmonary:     Effort: Pulmonary effort is normal. No respiratory distress.     Breath sounds: No stridor.  Abdominal:     General: There is no distension.  Musculoskeletal:        General: Normal range of motion.     Cervical back: Normal range of motion.  Neurological:     Mental Status: He is alert and oriented to person, place, and time.     Sensory: Sensation is intact.     Motor: Motor function is intact.  Psychiatric:         Behavior: Behavior normal.     ED Results and Treatments Labs (all labs ordered are listed, but only abnormal results are displayed) Labs Reviewed  BASIC METABOLIC PANEL - Abnormal; Notable for the following components:      Result Value   CO2 21 (*)    Glucose, Bld 113 (*)    All other components within normal limits  VALPROIC ACID LEVEL - Abnormal; Notable for the following components:   Valproic Acid Lvl <10 (*)    All other components within normal limits  CBC                                                                                                                         EKG  EKG Interpretation  Date/Time:    Ventricular Rate:    PR Interval:    QRS Duration:   QT Interval:    QTC Calculation:   R Axis:     Text Interpretation:        Radiology No results found.  Pertinent labs & imaging results that were available during my care of the patient were reviewed by me and considered in my medical decision making (see chart for details).  Medications Ordered in ED Medications  sodium chloride 0.9 % bolus 1,000 mL (0 mLs Intravenous Stopped 04/24/20 0201)  acetaminophen (TYLENOL) tablet 650 mg (650 mg Oral Given 04/23/20 2353)  ibuprofen (ADVIL) tablet 600 mg (600 mg Oral Given 04/24/20 0053)  Procedures Procedures  (including critical care time)  Medical Decision Making / ED Course I have reviewed the nursing notes for this encounter and the patient's prior records (if available in EHR or on provided paperwork).   Ronald Butler was evaluated in Emergency Department on 04/24/2020 for the symptoms described in the history of present illness. He was evaluated in the context of the global COVID-19 pandemic, which necessitated consideration that the patient might be at risk for infection with the SARS-CoV-2 virus that causes COVID-19.  Institutional protocols and algorithms that pertain to the evaluation of patients at risk for COVID-19 are in a state of rapid change based on information released by regulatory bodies including the CDC and federal and state organizations. These policies and algorithms were followed during the patient's care in the ED.  Reported seizure activity.  Currently back to his baseline mental status. During triage process labs were obtained and are grossly reassuring. Patient was provided with IV fluids and oral pain medicine.  While prepping to discharge the patient, he was witnessed to have another seizure-like activity.  I was able to break his seizure activity by squirting saline in his face.  Patient was not postictal during this time.  In my opinion, this was not consistent with a epileptic seizure.  Given the timing of this episode, I have a high suspicion for feigning and secondary gain.  Patient was felt to be stable to be discharged to custody of [employer]      Final Clinical Impression(s) / ED Diagnoses Final diagnoses:  Pseudoseizure   The patient appears reasonably screened and/or stabilized for discharge and I doubt any other medical condition or other [facility] requiring further screening, evaluation, or treatment in the ED at this time prior to discharge. Safe for discharge with strict return precautions.  Disposition: Discharge  Condition: Good  I have discussed the results, Dx and Tx plan with the patient/family who expressed understanding and agree(s) with the plan. Discharge instructions discussed at length. The patient/family was given strict return precautions who verbalized understanding of the instructions. No further questions at time of discharge.    ED Discharge Orders    None       Follow Up: Primary care provider  Schedule an appointment as soon as possible for a visit        This chart was dictated using voice recognition software.  Despite best efforts to  proofread,  errors can occur which can change the documentation meaning.   Nira Conn, MD 04/24/20 2024

## 2020-04-24 NOTE — ED Triage Notes (Signed)
Pt presents via EMS with LE, had 2 witnessed seizures at jail lasting approximately 30sec-64min each, pt was incontinent of urine, no oral trauma, upon EMS arrival pt was A&O x 4, c/o HA, pt states he has not had his depakote in 10 days and has been seen multiple times recently for same

## 2020-04-25 MED ORDER — ONDANSETRON HCL 4 MG/2ML IJ SOLN
4.0000 mg | Freq: Once | INTRAMUSCULAR | Status: AC
  Administered 2020-04-25: 4 mg via INTRAVENOUS
  Filled 2020-04-25: qty 2

## 2020-04-26 ENCOUNTER — Emergency Department (HOSPITAL_COMMUNITY)

## 2020-04-26 ENCOUNTER — Inpatient Hospital Stay (HOSPITAL_COMMUNITY)

## 2020-04-26 ENCOUNTER — Inpatient Hospital Stay (HOSPITAL_COMMUNITY)
Admission: EM | Admit: 2020-04-26 | Discharge: 2020-04-27 | DRG: 101 | Attending: Internal Medicine | Admitting: Internal Medicine

## 2020-04-26 ENCOUNTER — Other Ambulatory Visit: Payer: Self-pay

## 2020-04-26 ENCOUNTER — Encounter (HOSPITAL_COMMUNITY): Payer: Self-pay | Admitting: Emergency Medicine

## 2020-04-26 DIAGNOSIS — R531 Weakness: Secondary | ICD-10-CM | POA: Diagnosis present

## 2020-04-26 DIAGNOSIS — G40909 Epilepsy, unspecified, not intractable, without status epilepticus: Principal | ICD-10-CM | POA: Diagnosis present

## 2020-04-26 DIAGNOSIS — F17210 Nicotine dependence, cigarettes, uncomplicated: Secondary | ICD-10-CM | POA: Diagnosis present

## 2020-04-26 DIAGNOSIS — R569 Unspecified convulsions: Secondary | ICD-10-CM | POA: Diagnosis not present

## 2020-04-26 DIAGNOSIS — R262 Difficulty in walking, not elsewhere classified: Secondary | ICD-10-CM | POA: Diagnosis present

## 2020-04-26 DIAGNOSIS — Z79899 Other long term (current) drug therapy: Secondary | ICD-10-CM | POA: Diagnosis not present

## 2020-04-26 DIAGNOSIS — G8929 Other chronic pain: Secondary | ICD-10-CM | POA: Diagnosis present

## 2020-04-26 DIAGNOSIS — R400 Somnolence: Secondary | ICD-10-CM | POA: Diagnosis present

## 2020-04-26 DIAGNOSIS — Z20822 Contact with and (suspected) exposure to covid-19: Secondary | ICD-10-CM | POA: Diagnosis present

## 2020-04-26 DIAGNOSIS — R159 Full incontinence of feces: Secondary | ICD-10-CM | POA: Diagnosis present

## 2020-04-26 LAB — COMPREHENSIVE METABOLIC PANEL
ALT: 10 U/L (ref 0–44)
AST: 10 U/L — ABNORMAL LOW (ref 15–41)
Albumin: 4 g/dL (ref 3.5–5.0)
Alkaline Phosphatase: 57 U/L (ref 38–126)
Anion gap: 10 (ref 5–15)
BUN: 18 mg/dL (ref 6–20)
CO2: 24 mmol/L (ref 22–32)
Calcium: 8.8 mg/dL — ABNORMAL LOW (ref 8.9–10.3)
Chloride: 105 mmol/L (ref 98–111)
Creatinine, Ser: 1.02 mg/dL (ref 0.61–1.24)
GFR, Estimated: >60 mL/min (ref >60)
Glucose, Bld: 100 mg/dL — ABNORMAL HIGH (ref 70–99)
Potassium: 3.9 mmol/L (ref 3.5–5.1)
Sodium: 139 mmol/L (ref 135–145)
Total Bilirubin: 0.6 mg/dL (ref 0.3–1.2)
Total Protein: 6.6 g/dL (ref 6.5–8.1)

## 2020-04-26 LAB — CBC WITH DIFFERENTIAL/PLATELET
Abs Immature Granulocytes: 0.02 10*3/uL (ref 0.00–0.07)
Basophils Absolute: 0 10*3/uL (ref 0.0–0.1)
Basophils Relative: 0 %
Eosinophils Absolute: 0.1 10*3/uL (ref 0.0–0.5)
Eosinophils Relative: 1 %
HCT: 41.8 % (ref 39.0–52.0)
Hemoglobin: 14.2 g/dL (ref 13.0–17.0)
Immature Granulocytes: 0 %
Lymphocytes Relative: 21 %
Lymphs Abs: 1.7 10*3/uL (ref 0.7–4.0)
MCH: 30.8 pg (ref 26.0–34.0)
MCHC: 34 g/dL (ref 30.0–36.0)
MCV: 90.7 fL (ref 80.0–100.0)
Monocytes Absolute: 0.7 10*3/uL (ref 0.1–1.0)
Monocytes Relative: 9 %
Neutro Abs: 5.3 10*3/uL (ref 1.7–7.7)
Neutrophils Relative %: 69 %
Platelets: 224 10*3/uL (ref 150–400)
RBC: 4.61 MIL/uL (ref 4.22–5.81)
RDW: 12.7 % (ref 11.5–15.5)
WBC: 7.8 10*3/uL (ref 4.0–10.5)
nRBC: 0 % (ref 0.0–0.2)

## 2020-04-26 LAB — RAPID URINE DRUG SCREEN, HOSP PERFORMED
Amphetamines: NOT DETECTED
Barbiturates: NOT DETECTED
Benzodiazepines: POSITIVE — AB
Cocaine: NOT DETECTED
Opiates: NOT DETECTED
Tetrahydrocannabinol: NOT DETECTED

## 2020-04-26 LAB — RESPIRATORY PANEL BY RT PCR (FLU A&B, COVID)
Influenza A by PCR: NEGATIVE
Influenza B by PCR: NEGATIVE
SARS Coronavirus 2 by RT PCR: NEGATIVE

## 2020-04-26 LAB — VALPROIC ACID LEVEL: Valproic Acid Lvl: 23 ug/mL — ABNORMAL LOW (ref 50.0–100.0)

## 2020-04-26 LAB — ETHANOL: Alcohol, Ethyl (B): <10 mg/dL (ref <10)

## 2020-04-26 MED ORDER — KETOROLAC TROMETHAMINE 30 MG/ML IJ SOLN
30.0000 mg | Freq: Four times a day (QID) | INTRAMUSCULAR | Status: DC | PRN
  Administered 2020-04-26 – 2020-04-27 (×2): 30 mg via INTRAVENOUS
  Filled 2020-04-26 (×2): qty 1

## 2020-04-26 MED ORDER — SODIUM CHLORIDE 0.9 % IV SOLN: INTRAVENOUS | Status: DC

## 2020-04-26 MED ORDER — ACETAMINOPHEN 325 MG PO TABS
650.0000 mg | ORAL_TABLET | Freq: Four times a day (QID) | ORAL | Status: DC | PRN
  Administered 2020-04-27: 650 mg via ORAL
  Filled 2020-04-26: qty 2

## 2020-04-26 MED ORDER — ONDANSETRON HCL 4 MG/2ML IJ SOLN
4.0000 mg | Freq: Four times a day (QID) | INTRAMUSCULAR | Status: DC | PRN
  Administered 2020-04-26 – 2020-04-27 (×2): 4 mg via INTRAVENOUS
  Filled 2020-04-26 (×2): qty 2

## 2020-04-26 MED ORDER — STROKE: EARLY STAGES OF RECOVERY BOOK
Freq: Once | Status: DC
  Filled 2020-04-26: qty 1

## 2020-04-26 MED ORDER — ONDANSETRON HCL 4 MG PO TABS: 4.0000 mg | ORAL_TABLET | Freq: Four times a day (QID) | ORAL | Status: DC | PRN

## 2020-04-26 MED ORDER — OXYCODONE HCL 5 MG PO TABS
5.0000 mg | ORAL_TABLET | ORAL | Status: DC | PRN
  Administered 2020-04-26 – 2020-04-27 (×2): 5 mg via ORAL
  Filled 2020-04-26 (×2): qty 1

## 2020-04-26 MED ORDER — ACETAMINOPHEN 325 MG PO TABS
650.0000 mg | ORAL_TABLET | Freq: Once | ORAL | Status: AC
  Administered 2020-04-26: 650 mg via ORAL
  Filled 2020-04-26: qty 2

## 2020-04-26 MED ORDER — ACETAMINOPHEN 650 MG RE SUPP: 650.0000 mg | Freq: Four times a day (QID) | RECTAL | Status: DC | PRN

## 2020-04-26 NOTE — Progress Notes (Signed)
Patient terminated the procedure due to neck pain and refused to continue. Unable to perform MRI seizure sequences.

## 2020-04-26 NOTE — ED Notes (Signed)
I attempted to collect lab work and was unsuccessful

## 2020-04-26 NOTE — ED Notes (Signed)
Patient transported to MRI 

## 2020-04-26 NOTE — ED Provider Notes (Signed)
[facility]-EMERGENCY DEPT Provider Note   CSN: [id number] Arrival date & time: 04/26/20  1428     History Chief Complaint  Patient presents with  . Seizures    Ronald Butler is a 44 y.o. male.  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  Patient is currently incarcerated and was noted to have experienced an aura and sit down and aspirin seizure activity started.  EMS was called and patient given 5 mg of Versed IM.  Was then administered another 5 mg of Versed after he had additional seizure-like activity.  Patient has had multiple visits in the ED for similar symptoms.  He is on Depakote.  No further history obtainable due to his current state        Past Medical History:  Diagnosis Date  . Pancreatitis   . Pneumothorax   . Seizures [facility])     Patient Active Problem List   Diagnosis Date Noted  . 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 APPENDECTOMY    . CHOLECYSTECTOMY         Family History  Family history unknown: Yes    Social History   Tobacco Use  . Smoking status: Current Every Day Smoker    Packs/day: 2.00    Types: Cigarettes  . Smokeless tobacco: Never Used  Vaping Use  . Vaping Use: Never used  Substance Use Topics  . Alcohol use: Not Currently    Comment: occassional  . Drug use: Not Currently    Types: Marijuana    Home Medications Prior to Admission medications   Medication Sig Start Date End Date Taking? Authorizing Provider  divalproex (DEPAKOTE ER) 500 MG 24 hr tablet Take 1 tablet (500 mg total) by mouth daily. 04/23/20  Yes Rolan Bucco, MD    Allergies    Patient has no known allergies.  Review of Systems   Review of Systems  All other systems reviewed and are negative.   Physical Exam Updated Vital Signs BP (!) 136/94 (BP Location: Right Arm)   Pulse 88    Temp 99.4 F (37.4 C) (Oral)   Resp (!) 22   SpO2 100%   Physical Exam Vitals and nursing note reviewed.  Constitutional:      General: He is not in acute distress.    Appearance: Normal appearance. He is well-developed. He is not toxic-appearing.  HENT:     Head: Normocephalic and atraumatic.  Eyes:     General: Lids are normal.     Conjunctiva/sclera: Conjunctivae normal.     Pupils: Pupils are equal, round, and reactive to light.  Neck:     Thyroid: No thyroid mass.     Trachea: No tracheal deviation.  Cardiovascular:     Rate and Rhythm: Normal rate and regular rhythm.     Heart sounds: Normal heart sounds. No murmur heard.  No gallop.   Pulmonary:     Effort: Pulmonary effort is normal. No respiratory distress.     Breath sounds: Normal breath sounds. No stridor. No decreased breath sounds, wheezing, rhonchi or rales.  Abdominal:     General: Bowel sounds are normal. There is no distension.     Palpations: Abdomen is soft.     Tenderness: There is no abdominal tenderness. There is no rebound.  Musculoskeletal:        General: No tenderness. Normal range of motion.  Cervical back: Normal range of motion and neck supple.  Skin:    General: Skin is warm and dry.     Findings: No abrasion or rash.  Neurological:     Mental Status: He is lethargic and disoriented.     GCS: GCS eye subscore is 4. GCS verbal subscore is 4. GCS motor subscore is 6.     Cranial Nerves: Cranial nerves are intact. No cranial nerve deficit.     Sensory: No sensory deficit.     Motor: No tremor or seizure activity.     Comments: Patient withdraws to pain in all 4 extremities.  Psychiatric:        Speech: Speech normal.        Behavior: Behavior normal.     ED Results / Procedures / Treatments   Labs (all labs ordered are listed, but only abnormal results are displayed) Labs Reviewed  VALPROIC ACID LEVEL  CBC WITH DIFFERENTIAL/PLATELET  COMPREHENSIVE METABOLIC PANEL  RAPID URINE DRUG  SCREEN, HOSP PERFORMED  ETHANOL    EKG None  Radiology No results found.  Procedures Procedures (including critical care time)  Medications Ordered in ED Medications  0.9 %  sodium chloride infusion (has no administration in time range)    ED Course  I have reviewed the triage vital signs and the nursing notes.  Pertinent labs & imaging results that were available during my care of the patient were reviewed by me and considered in my medical decision making (see chart for details).    MDM Rules/Calculators/A&P                          Patient's work-up here is negative including head CT as well as labs.  His Depakote level was noted.  Discussed with Dr. Amada Jupiter from neurology who recommends patient be transferred to [facility] for treatment of his potential seizure disorder.  Will consult hospitalist Final Clinical Impression(s) / ED Diagnoses Final diagnoses:  None    Rx / DC Orders ED Discharge Orders    None       Lorre Nick, MD 04/26/20 1823

## 2020-04-26 NOTE — H&P (Signed)
TRH H&P    Patient Demographics:    Ronald Butler, is a 44 y.o. male  MRN: [id number]  DOB - 09/22/1975  Admit Date - 04/26/2020  Referring MD/NP/PA: Freida Busman  Outpatient Primary MD for the patient is Patient, No Pcp Per  Patient coming from: [facility]  Chief complaint- Seizures   HPI:    Ronald Butler  is a 44 y.o. male, with history of seizures, pneumothorax, pancreatitis presents to the ER with a chief complaint of seizure.  Patient reports that he was taken to jail 12 days ago, and at that time there was a lapse in his valproic acid.  He reports that there was some confusion with the jail staff about getting his correct medication and he went 9 days without it.  He reports that he started having seizure activity almost immediately.  The episodes have become more frequent in the past several days.  This is his third visit to the ER.  He reports 9-12 seizures in the last 3 days.  He reports 2 seizures today.  He reports he feels an aura which is a shocking feeling in his head, and he sees circles of light in his vision, then everything goes fuzzy and he wakes up laying down on the ground.  He reports that when he wakes up he is usually in vomit and has had urinary incontinence.  He reports that a couple times over the last week he is also had fecal incontinence.  He has not bitten his tongue or lip.  Patient reports that he usually remains hazy for 10 to 15 minutes after he comes to.  Staff reports that some of these episodes have been aborted by throwing cold water on his face.  In EMS on the way here he had one seizure, was given 5 mg of Versed, was completely alert and oriented and then had another seizure-like episode.  He was then given another 5 mg of Versed.  When the ED provider saw patient he was somnolent, but this is unclear if it was a post ictal state or if it is because he had just been given 10 mg of  Versed.  Review of systems patient also admits to left-sided weakness.  He reports that it started about a week ago.  He reports that he has had trouble walking, and he loses grip strength in his left hand intermittently.  Patient also reports that his neck is "on fire."  The pain radiates down his arms to his hands.  He reports that he has chronic neck pain since 2 surgeries on his neck in the remote past.  He reports that this flare of his pain started after today's seizure.  Patient smokes a pack a day, denies other illicit drugs.  In the ED Temperature 99.4, heart rate 71, respiratory rate 12, blood pressure 125/87 No leukocytosis with a white blood cell count of 7.8, hemoglobin 14.2 And panel is unremarkable Calcium is mildly low at 8.8 Valproic acid level is low at 23 Negative Covid and flu Alcohol level is less  than 10 CT head shows no evidence of acute abnormality EKG shows a heart rate of 97, QTC of 459, sinus rhythm ED provider spoke with neurology who recommended transfer to [facility] for possible continuous EEG.  No recommendation was given for any adjustment in medications.    Review of systems:    In addition to the HPI above,  No Fever-chills, No Headache, reports changes in vision prior to seizure-like activity, but no hearing, No problems swallowing food or Liquids, No Chest pain, Cough or Shortness of Breath, No Abdominal pain, reports vomiting during seizure-like activity, but bowel movements are regular, No Blood in stool or Urine, No dysuria, No new skin rashes or bruises, Reports flare of neck pain, no other new joint pains Reports new left-sided weakness, but no new tingling, numbness in any extremity, No recent weight gain or loss, No polyuria, polydypsia or polyphagia, Significant mental stressors of being in jail  All other systems reviewed and are negative.    Past History of the following :    Past Medical History:  Diagnosis Date    Pancreatitis    Pneumothorax    Seizures (HCC)       Past Surgical History:  Procedure Laterality Date   APPENDECTOMY     CHOLECYSTECTOMY        Social History:      Social History   Tobacco Use   Smoking status: Current Every Day Smoker    Packs/day: 2.00    Types: Cigarettes   Smokeless tobacco: Never Used  Substance Use Topics   Alcohol use: Not Currently    Comment: occassional       Family History :     Family History  Family history unknown: Yes      Home Medications:   Prior to Admission medications   Medication Sig Start Date End Date Taking? Authorizing Provider  divalproex (DEPAKOTE ER) 500 MG 24 hr tablet Take 1 tablet (500 mg total) by mouth daily. 04/23/20  Yes Rolan Bucco, MD     Allergies:    No Known Allergies   Physical Exam:   Vitals  Blood pressure 124/74, pulse 68, temperature 99.4 F (37.4 C), temperature source Oral, resp. rate (!) 21, SpO2 100 %.  1.  General: Patient lying supine in bed no acute distress   2. Psychiatric: Cooperative with exam, anxious, behavior normal for situation  3. Neurologic: Cranial nerves II through XII are intact Decreased coordination of left upper extremity 4/5 strength in left lower extremity Normal coordination and strength in right upper and lower extremity Normal sensation in extremities bilaterally Alert and oriented x3  4. HEENMT:  Head is atraumatic, normocephalic, pupils reactive to light, neck is supple, trachea is midline, mucous membranes are moist  5. Respiratory : Lungs are clear to auscultation bilaterally, no wheeze rhonchi or crackles No cyanosis or clubbing  6. Cardiovascular : Heart rate is normal, rhythm is regular, no murmurs rubs or gallops  7. Gastrointestinal:  Abdomen is soft, nondistended, nontender to palpation  8. Skin:  No acute lesions on limited skin exam  9.Musculoskeletal:  Right arm and right leg handcuffed to bed, no acute deformity, no  peripheral edema    Data Review:    CBC Recent Labs  Lab 04/23/20 2300 04/24/20 2249 04/26/20 1732  WBC 8.8 8.3 7.8  HGB 14.3 15.8 14.2  HCT 42.4 45.8 41.8  PLT 243 238 224  MCV 89.3 89.6 90.7  MCH 30.1 30.9 30.8  MCHC 33.7 34.5  34.0  RDW 12.8 12.8 12.7  LYMPHSABS  --   --  1.7  MONOABS  --   --  0.7  EOSABS  --   --  0.1  BASOSABS  --   --  0.0   ------------------------------------------------------------------------------------------------------------------  Results for orders placed or performed during the hospital encounter of 04/26/20 (from the past 48 hour(s))  Respiratory Panel by RT PCR (Flu A&B, Covid) - Nasopharyngeal Swab     Status: None   Collection Time: 04/26/20  3:41 PM   Specimen: Nasopharyngeal Swab  Result Value Ref Range   SARS Coronavirus 2 by RT PCR NEGATIVE NEGATIVE    Comment: (NOTE) SARS-CoV-2 target nucleic acids are NOT DETECTED.  The SARS-CoV-2 RNA is generally detectable in upper respiratoy specimens during the acute phase of infection. The lowest concentration of SARS-CoV-2 viral copies this assay can detect is 131 copies/mL. A negative result does not preclude SARS-Cov-2 infection and should not be used as the sole basis for treatment or other patient management decisions. A negative result may occur with  improper specimen collection/handling, submission of specimen other than nasopharyngeal swab, presence of viral mutation(s) within the areas targeted by this assay, and inadequate number of viral copies (<131 copies/mL). A negative result must be combined with clinical observations, patient history, and epidemiological information. The expected result is Negative.  Fact Sheet for Patients:  https://www.moore.com/  Fact Sheet for Healthcare Providers:  https://www.young.biz/  This test is no t yet approved or cleared by the [place] FDA and  has been authorized for detection and/or  diagnosis of SARS-CoV-2 by FDA under an Emergency Use Authorization (EUA). This EUA will remain  in effect (meaning this test can be used) for the duration of the COVID-19 declaration under Section 564(b)(1) of the Act, 21 U.S.C. section 360bbb-3(b)(1), unless the authorization is terminated or revoked sooner.     Influenza A by PCR NEGATIVE NEGATIVE   Influenza B by PCR NEGATIVE NEGATIVE    Comment: (NOTE) The Xpert Xpress SARS-CoV-2/FLU/RSV assay is intended as an aid in  the diagnosis of influenza from Nasopharyngeal swab specimens and  should not be used as a sole basis for treatment. Nasal washings and  aspirates are unacceptable for Xpert Xpress SARS-CoV-2/FLU/RSV  testing.  Fact Sheet for Patients: https://www.moore.com/  Fact Sheet for Healthcare Providers: https://www.young.biz/  This test is not yet approved or cleared by the [place] FDA and  has been authorized for detection and/or diagnosis of SARS-CoV-2 by  FDA under an Emergency Use Authorization (EUA). This EUA will remain  in effect (meaning this test can be used) for the duration of the  Covid-19 declaration under Section 564(b)(1) of the Act, 21  U.S.C. section 360bbb-3(b)(1), unless the authorization is  terminated or revoked. Performed at [facility], [address], [place], [place] [address]   Valproic acid level     Status: Abnormal   Collection Time: 04/26/20  5:32 PM  Result Value Ref Range   Valproic Acid Lvl 23 (L) 50.0 - 100.0 ug/mL    Comment: Performed at [facility], [address], [place], [place] [address]  CBC with Differential/Platelet     Status: None   Collection Time: 04/26/20  5:32 PM  Result Value Ref Range   WBC 7.8 4.0 - 10.5 K/uL   RBC 4.61 4.22 - 5.81 MIL/uL   Hemoglobin 14.2 13.0 - 17.0 g/dL   HCT 19.1 39 - 52 %   MCV 90.7 80.0 -  100.0 fL   MCH 30.8 26.0 - 34.0 pg   MCHC 34.0 30.0 - 36.0 g/dL    RDW 99.3 71.6 - 96.7 %   Platelets 224 150 - 400 K/uL   nRBC 0.0 0.0 - 0.2 %   Neutrophils Relative % 69 %   Neutro Abs 5.3 1.7 - 7.7 K/uL   Lymphocytes Relative 21 %   Lymphs Abs 1.7 0.7 - 4.0 K/uL   Monocytes Relative 9 %   Monocytes Absolute 0.7 0.1 - 1.0 K/uL   Eosinophils Relative 1 %   Eosinophils Absolute 0.1 0.0 - 0.5 K/uL   Basophils Relative 0 %   Basophils Absolute 0.0 0.0 - 0.1 K/uL   Immature Granulocytes 0 %   Abs Immature Granulocytes 0.02 0.00 - 0.07 K/uL    Comment: Performed at [facility], [address] [place], [place] [address]  Comprehensive metabolic panel     Status: Abnormal   Collection Time: 04/26/20  5:32 PM  Result Value Ref Range   Sodium 139 135 - 145 mmol/L   Potassium 3.9 3.5 - 5.1 mmol/L   Chloride 105 98 - 111 mmol/L   CO2 24 22 - 32 mmol/L   Glucose, Bld 100 (H) 70 - 99 mg/dL    Comment: Glucose reference range applies only to samples taken after fasting for at least 8 hours.   BUN 18 6 - 20 mg/dL   Creatinine, Ser 0.17 0.61 - 1.24 mg/dL   Calcium 8.8 (L) 8.9 - 10.3 mg/dL   Total Protein 6.6 6.5 - 8.1 g/dL   Albumin 4.0 3.5 - 5.0 g/dL   AST 10 (L) 15 - 41 U/L   ALT 10 0 - 44 U/L   Alkaline Phosphatase 57 38 - 126 U/L   Total Bilirubin 0.6 0.3 - 1.2 mg/dL   GFR, Estimated >51 >02 mL/min    Comment: (NOTE) Calculated using the CKD-EPI Creatinine Equation (2021)    Anion gap 10 5 - 15    Comment: Performed at [facility], [address] [place], [place] [address]  Ethanol     Status: None   Collection Time: 04/26/20  5:32 PM  Result Value Ref Range   Alcohol, Ethyl (B) <10 <10 mg/dL    Comment: (NOTE) Lowest detectable limit for serum alcohol is 10 mg/dL.  For medical purposes only. Performed at [facility], [address] [place], [place] [address]   Rapid urine drug screen (hospital performed)     Status: Abnormal   Collection Time: 04/26/20  6:19 PM  Result Value  Ref Range   Opiates NONE DETECTED NONE DETECTED   Cocaine NONE DETECTED NONE DETECTED   Benzodiazepines POSITIVE (A) NONE DETECTED   Amphetamines NONE DETECTED NONE DETECTED   Tetrahydrocannabinol NONE DETECTED NONE DETECTED   Barbiturates NONE DETECTED NONE DETECTED    Comment: (NOTE) DRUG SCREEN FOR MEDICAL PURPOSES ONLY.  IF CONFIRMATION IS NEEDED FOR ANY PURPOSE, NOTIFY LAB WITHIN 5 DAYS.  LOWEST DETECTABLE LIMITS FOR URINE DRUG SCREEN Drug Class                     Cutoff (ng/mL) Amphetamine and metabolites    1000 Barbiturate and metabolites    200 Benzodiazepine                 200 Tricyclics and metabolites     300 Opiates and metabolites        300 Cocaine and metabolites  300 THC                            50 Performed at [facility], [address] [place], [place] [address]     Chemistries  Recent Labs  Lab 04/23/20 2300 04/24/20 2249 04/26/20 1732  NA 138 138 139  K 3.6 3.9 3.9  CL 106 104 105  CO2 21* 25 24  GLUCOSE 113* 100* 100*  BUN CREATININE 0.98 0.98 1.02  CALCIUM 9.1 9.6 8.8*  AST  --  10* 10*  ALT  --  11 10  ALKPHOS  --  66 57  BILITOT  --  0.8 0.6   ------------------------------------------------------------------------------------------------------------------  ------------------------------------------------------------------------------------------------------------------ GFR: Estimated Creatinine Clearance: 98.8 mL/min (by C-G formula based on SCr of 1.02 mg/dL). Liver Function Tests: Recent Labs  Lab 04/24/20 2249 04/26/20 1732  AST 10* 10*  ALT 11 10  ALKPHOS 66 57  BILITOT 0.8 0.6  PROT 7.8 6.6  ALBUMIN 4.7 4.0   No results for input(s): LIPASE, AMYLASE in the last 168 hours. No results for input(s): AMMONIA in the last 168 hours. Coagulation Profile: No results for input(s): INR, PROTIME in the last 168 hours. Cardiac Enzymes: Recent Labs  Lab 04/24/20 2249  CKTOTAL 39*    BNP (last 3 results) No results for input(s): PROBNP in the last 8760 hours. HbA1C: No results for input(s): HGBA1C in the last 72 hours. CBG: Recent Labs  Lab 04/22/20 2343  GLUCAP 103*   Lipid Profile: No results for input(s): CHOL, HDL, LDLCALC, TRIG, CHOLHDL, LDLDIRECT in the last 72 hours. Thyroid Function Tests: No results for input(s): TSH, T4TOTAL, FREET4, T3FREE, THYROIDAB in the last 72 hours. Anemia Panel: No results for input(s): VITAMINB12, FOLATE, FERRITIN, TIBC, IRON, RETICCTPCT in the last 72 hours.  --------------------------------------------------------------------------------------------------------------- Urine analysis:    Component Value Date/Time   COLORURINE YELLOW (A) 01/08/2018 2230   APPEARANCEUR CLEAR (A) 01/08/2018 2230   LABSPEC 1.021 01/08/2018 2230   PHURINE 5.0 01/08/2018 2230   GLUCOSEU NEGATIVE 01/08/2018 2230   HGBUR SMALL (A) 01/08/2018 2230   BILIRUBINUR NEGATIVE 01/08/2018 2230   KETONESUR NEGATIVE 01/08/2018 2230   PROTEINUR NEGATIVE 01/08/2018 2230   NITRITE NEGATIVE 01/08/2018 2230   LEUKOCYTESUR NEGATIVE 01/08/2018 2230      Imaging Results:    CT Head Wo Contrast  Result Date: 04/26/2020 CLINICAL DATA:  Cerebral hemorrhage suspected.  Seizure. EXAM: CT HEAD WITHOUT CONTRAST TECHNIQUE: Contiguous axial images were obtained from the base of the skull through the vertex without intravenous contrast. COMPARISON:  CT head 04/18/2020 FINDINGS: Brain: No evidence of acute large vascular territory infarction, hemorrhage, hydrocephalus, extra-axial collection or mass lesion/mass effect. Vascular: No hyperdense vessel or unexpected calcification. Skull: No acute fracture. Sinuses/Orbits: Mild ethmoid air cell mucosal thickening. No air-fluid levels. Unremarkable orbits. Other: No mastoid effusions. IMPRESSION: No evidence of acute intracranial abnormality. Electronically Signed   By: Feliberto Harts MD   On: 04/26/2020 17:00    My  personal review of EKG: Rhythm NSR, Rate 97 /min, QTc 459 ,no Acute ST changes   Assessment & Plan:    Active Problems:   Witnessed seizure-like activity (HCC)   1. Seizure-like activity 1. With history of epilepsy on valproic acid 2. Reports labs and valproic acid, 3 days ago valproic acid level was less than 10 3. Today valproic acid level still low at 23 4. Patient has had up to 12  seizure-like activity episodes in the last 3 days, he has been to the ER 3 times 5. History is partly consistent with seizures, and partly consistent with nonepileptiform activity 6. Neurology consulted and advised [facility] EEG 7. We will continue valproic acid 8. Seizure precautions 9. Neurochecks 10. Continue to monitor 2. Left-sided weakness 1. Patient reports 1 week of left-sided weakness 2. CVA order set utilized 3. Today CT shows no acute abnormality 4. MRI, MRA, ultrasound carotids, echo, PT, OT, ST in the a.m. 5. Monitor on telemetry 3. Neck pain 1. Chronic, but worsened after 1 of today's episodes 2. Pain scale ordered 3. Continue to monitor 4. Tobacco dependence 1. Counseled on the importance of cessation 2. Patient declines nicotine patch at this time    DVT Prophylaxis-   Heparin- SCDs   AM Labs Ordered, also please review Full Orders  Family Communication: No family at bedside  Code Status:  FULL  Admission status: Inpatient :The appropriate admission status for this patient is INPATIENT. Inpatient status is judged to be reasonable and necessary in order to provide the required intensity of service to ensure the patient's safety. The patient's presenting symptoms, physical exam findings, and initial radiographic and laboratory data in the context of their chronic comorbidities is felt to place them at high risk for further clinical deterioration. Furthermore, it is not anticipated that the patient will be medically stable for discharge from  the hospital within 2 midnights of admission. The following factors support the admission status of inpatient.     The patient's presenting symptoms include seizure like activity The worrisome physical exam findings include Witnessed seizure activity by EMS The chronic co-morbidities include epilepsy       * I certify that at the point of admission it is my clinical judgment that the patient will require inpatient hospital care spanning beyond 2 midnights from the point of admission due to high intensity of service, high risk for further deterioration and high frequency of surveillance required.*  Time spent in minutes : 65   Tu Bayle B Zierle-Ghosh DO

## 2020-04-26 NOTE — ED Triage Notes (Signed)
BIBA Per EMS: Pt coming from the jail complaints of seizure Hx of epilepsy  Pt experienced an aura so he sat down and that's when seizure began.  Seizing on EMS arrival : 5mg  Versed IM  Pt A&OX3 after coming to Seized again on stretcher:   Another 5mg  Versed IM  Vitals: 111/82 100 HR 20 RR 98% room air

## 2020-04-26 NOTE — ED Notes (Signed)
Main pharmacy at bedside for blood draw.

## 2020-04-27 ENCOUNTER — Inpatient Hospital Stay (HOSPITAL_COMMUNITY)

## 2020-04-27 ENCOUNTER — Other Ambulatory Visit: Payer: Self-pay

## 2020-04-27 DIAGNOSIS — R569 Unspecified convulsions: Secondary | ICD-10-CM

## 2020-04-27 LAB — LIPID PANEL
Cholesterol: 104 mg/dL (ref 0–200)
HDL: 34 mg/dL — ABNORMAL LOW (ref >40)
LDL Cholesterol: 55 mg/dL (ref 0–99)
Total CHOL/HDL Ratio: 3.1 RATIO
Triglycerides: 77 mg/dL (ref <150)
VLDL: 15 mg/dL (ref 0–40)

## 2020-04-27 LAB — CBC WITH DIFFERENTIAL/PLATELET
Abs Immature Granulocytes: 0.02 10*3/uL (ref 0.00–0.07)
Basophils Absolute: 0 10*3/uL (ref 0.0–0.1)
Basophils Relative: 1 %
Eosinophils Absolute: 0 10*3/uL (ref 0.0–0.5)
Eosinophils Relative: 1 %
HCT: 41.6 % (ref 39.0–52.0)
Hemoglobin: 14.1 g/dL (ref 13.0–17.0)
Immature Granulocytes: 0 %
Lymphocytes Relative: 20 %
Lymphs Abs: 1.7 10*3/uL (ref 0.7–4.0)
MCH: 31.1 pg (ref 26.0–34.0)
MCHC: 33.9 g/dL (ref 30.0–36.0)
MCV: 91.6 fL (ref 80.0–100.0)
Monocytes Absolute: 0.8 10*3/uL (ref 0.1–1.0)
Monocytes Relative: 9 %
Neutro Abs: 5.9 10*3/uL (ref 1.7–7.7)
Neutrophils Relative %: 69 %
Platelets: 225 10*3/uL (ref 150–400)
RBC: 4.54 MIL/uL (ref 4.22–5.81)
RDW: 12.9 % (ref 11.5–15.5)
WBC: 8.5 10*3/uL (ref 4.0–10.5)
nRBC: 0 % (ref 0.0–0.2)

## 2020-04-27 LAB — MAGNESIUM: Magnesium: 2.2 mg/dL (ref 1.7–2.4)

## 2020-04-27 LAB — COMPREHENSIVE METABOLIC PANEL
ALT: 9 U/L (ref 0–44)
AST: 14 U/L — ABNORMAL LOW (ref 15–41)
Albumin: 4.1 g/dL (ref 3.5–5.0)
Alkaline Phosphatase: 61 U/L (ref 38–126)
Anion gap: 11 (ref 5–15)
BUN: 16 mg/dL (ref 6–20)
CO2: 24 mmol/L (ref 22–32)
Calcium: 8.9 mg/dL (ref 8.9–10.3)
Chloride: 105 mmol/L (ref 98–111)
Creatinine, Ser: 1.11 mg/dL (ref 0.61–1.24)
GFR, Estimated: >60 mL/min (ref >60)
Glucose, Bld: 128 mg/dL — ABNORMAL HIGH (ref 70–99)
Potassium: 3.7 mmol/L (ref 3.5–5.1)
Sodium: 140 mmol/L (ref 135–145)
Total Bilirubin: 0.7 mg/dL (ref 0.3–1.2)
Total Protein: 6.7 g/dL (ref 6.5–8.1)

## 2020-04-27 LAB — ECHOCARDIOGRAM COMPLETE
Area-P 1/2: 2.45 cm2
S' Lateral: 3.1 cm

## 2020-04-27 LAB — HIV ANTIBODY (ROUTINE TESTING W REFLEX): HIV Screen 4th Generation wRfx: NONREACTIVE

## 2020-04-27 LAB — TSH: TSH: 2.637 u[IU]/mL (ref 0.350–4.500)

## 2020-04-27 MED ORDER — DIVALPROEX SODIUM 500 MG PO DR TAB: 500.0000 mg | DELAYED_RELEASE_TABLET | Freq: Two times a day (BID) | ORAL | Status: DC

## 2020-04-27 MED ORDER — ONDANSETRON HCL 4 MG PO TABS: 4.0000 mg | ORAL_TABLET | Freq: Every day | ORAL | 0 refills | Status: AC | PRN

## 2020-04-27 MED ORDER — ACETAMINOPHEN 325 MG PO TABS: 650.0000 mg | ORAL_TABLET | Freq: Four times a day (QID) | ORAL | Status: DC | PRN

## 2020-04-27 MED ORDER — POLYETHYLENE GLYCOL 3350 17 G PO PACK: 17.0000 g | PACK | Freq: Every day | ORAL | Status: DC | PRN

## 2020-04-27 MED ORDER — HEPARIN SODIUM (PORCINE) 5000 UNIT/ML IJ SOLN
5000.0000 [IU] | Freq: Three times a day (TID) | INTRAMUSCULAR | Status: DC
  Administered 2020-04-27: 5000 [IU] via SUBCUTANEOUS
  Filled 2020-04-27: qty 1

## 2020-04-27 MED ORDER — DIVALPROEX SODIUM ER 500 MG PO TB24: 500.0000 mg | ORAL_TABLET | Freq: Every day | ORAL | Status: DC

## 2020-04-27 MED ORDER — DIVALPROEX SODIUM ER 500 MG PO TB24: 500.0000 mg | ORAL_TABLET | Freq: Two times a day (BID) | ORAL | 2 refills | Status: DC

## 2020-04-27 MED ORDER — VALPROATE SODIUM 500 MG/5ML IV SOLN
1000.0000 mg | INTRAVENOUS | Status: AC
  Administered 2020-04-27: 1000 mg via INTRAVENOUS
  Filled 2020-04-27: qty 10

## 2020-04-27 NOTE — ED Notes (Signed)
Discharge instructions and prescriptions reviewed with pt and officer accompanying pt.  Pt will be d/c back to jail with accompanying officer once their transportation services arrive.

## 2020-04-27 NOTE — Discharge Summary (Signed)
Physician Discharge Summary  Ronald Butler JYN:[id number] DOB: Sep 28, 1975 DOA: 04/26/2020  PCP: Patient, No Pcp Per  Admit date: 04/26/2020 Discharge date: 04/27/2020  Admitted From: Jail Disposition: Jail   Recommendations for Outpatient Follow-up:  1. Follow up with PCP in 1-2 weeks   Discharge Condition: Stable CODE STATUS: Full code Diet recommendation: Regular diet  Discharge summary: 44 year old gentleman with history of seizure disorder currently in jail for the last 12 days brought back to the ER multiple times with multiple episodes of seizure-like activities.  Patient reported that he was not able to get his Depakote as he supposed to be because of some discrepancies on his medications.  Had visited ER few days ago and discharged back to continue Depakote.  This time was brought to the ER again with similar symptoms where patient complains of aura.  No history of trauma, no history of incontinence of urine or stool.  Patient was also complained of neck pain.  He does have a history of hardware fusion on his cervical spine.  He was monitored in the hospital with complaints of multiple episodes of seizure.  He remained in ER for last 18 hours with no recurrence of any seizure activities.  Also complaining of left-sided leg weakness.  Underwent extensive medical investigations as below.  MRI of the brain, MRA of the head, MRI of the cervical spine with no active findings.  Patient with no neurological deficits. Carotid ultrasound and 2D echocardiogram done, results pending but not anticipating any intervention. Stroke ruled out. Valproic acid level was low which is consistent with not taking adequate medicine at jail. Loaded with 1 g of Depakote, prescription provided to continue Depakote ER 500 mg 2 times a day.  He also wanted some Zofran for nausea that was provided. Also seen by neurology, however he has established diagnosis and his seizure was breakthrough seizure with evidence  of subtherapeutic level of medications .  Loading dose of medication given.  Seizure precautions discussed and instructions provided.  There is a question whether these multiple episodes of seizure-like activities were nonepileptic as it was reported that episodes aborted after putting some water on his face.  Discharge Diagnoses:  Active Problems:   Witnessed seizure-like activity [facility])    Discharge Instructions  Discharge Instructions    Diet general   Complete by: As directed    Discharge instructions   Complete by: As directed    Per [facility] statutes, patients with seizures are not allowed to drive until  they have been seizure-free for six months. Use caution when using heavy equipment or power tools. Avoid working on ladders or at heights. Take showers instead of baths. Ensure the water temperature is not too high on the home water heater. Do not go swimming alone. When caring for infants or small children, sit down when holding, feeding, or changing them to minimize risk of injury to the child in the event you have a seizure.   Increase activity slowly   Complete by: As directed      Allergies as of 04/27/2020   No Known Allergies     Medication List    TAKE these medications   acetaminophen 325 MG tablet Commonly known as: TYLENOL Take 2 tablets (650 mg total) by mouth every 6 (six) hours as needed for mild pain (or Fever >/= 101).   divalproex 500 MG 24 hr tablet Commonly known as: Depakote ER Take 1 tablet (500 mg total) by mouth in the morning and at  bedtime. What changed: when to take this   ondansetron 4 MG tablet Commonly known as: Zofran Take 1 tablet (4 mg total) by mouth daily as needed for up to 10 days for nausea or vomiting.       No Known Allergies  Consultations:  Neurology   Procedures/Studies: DG Ribs Unilateral W/Chest Right  Result Date: 04/23/2020 CLINICAL DATA:  Fall, right rib pain EXAM: RIGHT RIBS AND CHEST - 3+ VIEW  COMPARISON:  None. FINDINGS: No fracture or other bone lesions are seen involving the ribs. There is no evidence of pneumothorax or pleural effusion. Both lungs are clear. Heart size and mediastinal contours are within normal limits. IMPRESSION: Negative. Electronically Signed   By: Jonna Clark M.D.   On: 04/23/2020 01:06   CT Head Wo Contrast  Result Date: 04/26/2020 CLINICAL DATA:  Cerebral hemorrhage suspected.  Seizure. EXAM: CT HEAD WITHOUT CONTRAST TECHNIQUE: Contiguous axial images were obtained from the base of the skull through the vertex without intravenous contrast. COMPARISON:  CT head 04/18/2020 FINDINGS: Brain: No evidence of acute large vascular territory infarction, hemorrhage, hydrocephalus, extra-axial collection or mass lesion/mass effect. Vascular: No hyperdense vessel or unexpected calcification. Skull: No acute fracture. Sinuses/Orbits: Mild ethmoid air cell mucosal thickening. No air-fluid levels. Unremarkable orbits. Other: No mastoid effusions. IMPRESSION: No evidence of acute intracranial abnormality. Electronically Signed   By: Feliberto Harts MD   On: 04/26/2020 17:00   CT Head Wo Contrast  Result Date: 04/18/2020 CLINICAL DATA:  Seizure, nontraumatic. EXAM: CT HEAD WITHOUT CONTRAST TECHNIQUE: Contiguous axial images were obtained from the base of the skull through the vertex without intravenous contrast. COMPARISON:  Report from head CT 02/11/2003 (images unavailable). FINDINGS: Brain: Cerebral volume is normal. There is no acute intracranial hemorrhage. No demarcated cortical infarct. No extra-axial fluid collection. No evidence of intracranial mass. No midline shift. Vascular: No hyperdense vessel. Skull: Normal. Negative for fracture or focal lesion. Sinuses/Orbits: Visualized orbits show no acute finding. Mild ethmoid sinus mucosal thickening. No significant mastoid effusion. IMPRESSION: No evidence of acute intracranial abnormality. Mild ethmoid sinus mucosal  thickening. Electronically Signed   By: Jackey Loge DO   On: 04/18/2020 19:02   MR ANGIO HEAD WO CONTRAST  Result Date: 04/26/2020 CLINICAL DATA:  Seizure and headaches EXAM: MRI HEAD WITHOUT CONTRAST MRA HEAD WITHOUT CONTRAST TECHNIQUE: Multiplanar, multiecho pulse sequences of the brain and surrounding structures were obtained without intravenous contrast. Angiographic images of the head were obtained using MRA technique without contrast. COMPARISON:  None. FINDINGS: MRI HEAD FINDINGS Brain: No acute infarct, acute hemorrhage or extra-axial collection. Normal white matter signal. Normal volume of CSF spaces. No chronic microhemorrhage. Normal midline structures. Vascular: Normal flow voids. Skull and upper cervical spine: Normal marrow signal. Sinuses/Orbits: Negative. Other: None. MRA HEAD FINDINGS POSTERIOR CIRCULATION: --Vertebral arteries: Normal --Inferior cerebellar arteries: Normal. --Basilar artery: Normal. --Superior cerebellar arteries: Normal. --Posterior cerebral arteries: Normal. ANTERIOR CIRCULATION: --Intracranial internal carotid arteries: Normal. --Anterior cerebral arteries (ACA): Normal. --Middle cerebral arteries (MCA): Normal. ANATOMIC VARIANTS: Fetal origin of the left PCA. IMPRESSION: Normal MRI/MRA of the brain. Electronically Signed   By: Deatra Robinson M.D.   On: 04/26/2020 22:06   MR BRAIN WO CONTRAST  Result Date: 04/26/2020 CLINICAL DATA:  Seizure and headaches EXAM: MRI HEAD WITHOUT CONTRAST MRA HEAD WITHOUT CONTRAST TECHNIQUE: Multiplanar, multiecho pulse sequences of the brain and surrounding structures were obtained without intravenous contrast. Angiographic images of the head were obtained using MRA technique without contrast. COMPARISON:  None. FINDINGS: MRI  HEAD FINDINGS Brain: No acute infarct, acute hemorrhage or extra-axial collection. Normal white matter signal. Normal volume of CSF spaces. No chronic microhemorrhage. Normal midline structures. Vascular: Normal  flow voids. Skull and upper cervical spine: Normal marrow signal. Sinuses/Orbits: Negative. Other: None. MRA HEAD FINDINGS POSTERIOR CIRCULATION: --Vertebral arteries: Normal --Inferior cerebellar arteries: Normal. --Basilar artery: Normal. --Superior cerebellar arteries: Normal. --Posterior cerebral arteries: Normal. ANTERIOR CIRCULATION: --Intracranial internal carotid arteries: Normal. --Anterior cerebral arteries (ACA): Normal. --Middle cerebral arteries (MCA): Normal. ANATOMIC VARIANTS: Fetal origin of the left PCA. IMPRESSION: Normal MRI/MRA of the brain. Electronically Signed   By: Deatra Robinson M.D.   On: 04/26/2020 22:06   MR CERVICAL SPINE WO CONTRAST  Result Date: 04/27/2020 CLINICAL DATA:  Cervical radiculopathy, pain post seizure EXAM: MRI CERVICAL SPINE WITHOUT CONTRAST TECHNIQUE: Multiplanar, multisequence MR imaging of the cervical spine was performed. No intravenous contrast was administered. COMPARISON:  None. FINDINGS: Alignment: Preserved. Vertebrae: Prior anterior cervical fusion at C5-C6 with plate and screw fixation and interbody graft or which appears well incorporated. Hardware is not well evaluated this study and there is some associated susceptibility artifact. Vertebral body heights are maintained. There is minor degenerative endplate marrow edema at C6-C7. No suspicious osseous lesion. Cord: Normal caliber and signal. Posterior Fossa, vertebral arteries, paraspinal tissues: Unremarkable. Disc levels: C2-C3:  No stenosis. C3-C4: Trace disc bulge with small endplate osteophytes. No stenosis. C4-C5: Trace disc bulge with small endplate osteophytes. No stenosis. C5-C6:  Fusion level.  No stenosis. C6-C7: Trace disc bulge with small endplate osteophytes. No stenosis. C7-T1:  No stenosis. IMPRESSION: Postoperative changes at C5-C6. Minor degenerative changes without stenosis. Electronically Signed   By: Guadlupe Spanish M.D.   On: 04/27/2020 11:14   DG Chest Port 1 View  Result Date:  04/18/2020 CLINICAL DATA:  Altered mental status and seizure. EXAM: PORTABLE CHEST 1 VIEW COMPARISON:  April 05, 2018 FINDINGS: The heart size and mediastinal contours are within normal limits. Both lungs are clear. A radiopaque fusion plate and screws are seen overlying the cervical spine. IMPRESSION: No acute cardiopulmonary disease. Electronically Signed   By: Aram Candela M.D.   On: 04/18/2020 18:23   VAS US CAROTID (at [facility] and [facility] only)  Result Date: 04/27/2020 Carotid Arterial Duplex Study Indications:       Weakness. Risk Factors:      None. Comparison Study:  No prior studies. Performing Technologist: Chanda Busing RVT  Examination Guidelines: A complete evaluation includes B-mode imaging, spectral Doppler, color Doppler, and power Doppler as needed of all accessible portions of each vessel. Bilateral testing is considered an integral part of a complete examination. Limited examinations for reoccurring indications may be performed as noted.  Right Carotid Findings: +----------+--------+--------+--------+------------------+------------------+           PSV cm/sEDV cm/sStenosisPlaque DescriptionComments           +----------+--------+--------+--------+------------------+------------------+ CCA Prox  123     13                                                   +----------+--------+--------+--------+------------------+------------------+ CCA Distal87      20                                intimal thickening +----------+--------+--------+--------+------------------+------------------+ ICA Prox  51  11                                intimal thickening +----------+--------+--------+--------+------------------+------------------+ ICA Distal62      22                                                   +----------+--------+--------+--------+------------------+------------------+ ECA       57      13                                                    +----------+--------+--------+--------+------------------+------------------+ +----------+--------+-------+--------+-------------------+           PSV cm/sEDV cmsDescribeArm Pressure (mmHG) +----------+--------+-------+--------+-------------------+ [id number]                                        +----------+--------+-------+--------+-------------------+ +---------+--------+--+--------+--+---------+ VertebralPSV cm/s49EDV cm/s10Antegrade +---------+--------+--+--------+--+---------+  Left Carotid Findings: +----------+--------+--------+--------+------------------+------------------+           PSV cm/sEDV cm/sStenosisPlaque DescriptionComments           +----------+--------+--------+--------+------------------+------------------+ CCA Prox  143     19                                intimal thickening +----------+--------+--------+--------+------------------+------------------+ CCA Distal76      20                                intimal thickening +----------+--------+--------+--------+------------------+------------------+ ICA Prox  50      17                                intimal thickening +----------+--------+--------+--------+------------------+------------------+ ICA Distal77      29                                                   +----------+--------+--------+--------+------------------+------------------+ ECA       62      11                                                   +----------+--------+--------+--------+------------------+------------------+ +----------+--------+--------+--------+-------------------+           PSV cm/sEDV cm/sDescribeArm Pressure (mmHG) +----------+--------+--------+--------+-------------------+ [id number]                                         +----------+--------+--------+--------+-------------------+ +---------+--------+--+--------+--+---------+ VertebralPSV cm/s50EDV cm/s15Antegrade  +---------+--------+--+--------+--+---------+   Summary: Right Carotid: Velocities in the right ICA are consistent with a 1-39% stenosis. Left Carotid: Velocities in the left ICA are consistent with a 1-39% stenosis.  Vertebrals: Bilateral vertebral arteries demonstrate antegrade flow. *See table(s) above for measurements and observations.     Preliminary    (Echo, Carotid, EGD, Colonoscopy, ERCP)    Subjective: Patient seen and examined.  He was repeatedly examined for discharge readiness.  He has some neck pain otherwise denies any complaints.  He was feeling some nausea. Patient was really not sure whether he should go back to jail today or not.  We discussed about close monitoring in a supervised setting and they will bring him back if there is any problem.    Discharge Exam: Vitals:   04/27/20 0745 04/27/20 1305  BP: 117/80 99/69  Pulse: 81 79  Resp: 17 15  Temp:  98.6 F (37 C)  SpO2: 100% 99%   Vitals:   04/27/20 0608 04/27/20 0740 04/27/20 0745 04/27/20 1305  BP: 128/86 117/80 117/80 99/69  Pulse: 69 76 81 79  Resp: (!) 21 16 17 15   Temp: 98.9 F (37.2 C)   98.6 F (37 C)  TempSrc: Oral   Oral  SpO2: 100% 100% 100% 99%    General: Pt is alert, awake, not in acute distress He has no neurological deficits. Cardiovascular: RRR, S1/S2 +, no rubs, no gallops Respiratory: CTA bilaterally, no wheezing, no rhonchi Abdominal: Soft, NT, ND, bowel sounds + Extremities: no edema, no cyanosis    The results of significant diagnostics from this hospitalization (including imaging, microbiology, ancillary and laboratory) are listed below for reference.     Microbiology: Recent Results (from the past 240 hour(s))  Respiratory Panel by RT PCR (Flu A&B, Covid) - Nasopharyngeal Swab     Status: None   Collection Time: 04/18/20  6:14 PM   Specimen: Nasopharyngeal Swab  Result Value Ref Range Status   SARS Coronavirus 2 by RT PCR NEGATIVE NEGATIVE Final    Comment:  (NOTE) SARS-CoV-2 target nucleic acids are NOT DETECTED.  The SARS-CoV-2 RNA is generally detectable in upper respiratoy specimens during the acute phase of infection. The lowest concentration of SARS-CoV-2 viral copies this assay can detect is 131 copies/mL. A negative result does not preclude SARS-Cov-2 infection and should not be used as the sole basis for treatment or other patient management decisions. A negative result may occur with  improper specimen collection/handling, submission of specimen other than nasopharyngeal swab, presence of viral mutation(s) within the areas targeted by this assay, and inadequate number of viral copies (<131 copies/mL). A negative result must be combined with clinical observations, patient history, and epidemiological information. The expected result is Negative.  Fact Sheet for Patients:  https://www.moore.com/https://www.fda.gov/media/142436/download  Fact Sheet for Healthcare Providers:  https://www.young.biz/https://www.fda.gov/media/142435/download  This test is no t yet approved or cleared by the [place] FDA and  has been authorized for detection and/or diagnosis of SARS-CoV-2 by FDA under an Emergency Use Authorization (EUA). This EUA will remain  in effect (meaning this test can be used) for the duration of the COVID-19 declaration under Section 564(b)(1) of the Act, 21 U.S.C. section 360bbb-3(b)(1), unless the authorization is terminated or revoked sooner.     Influenza A by PCR NEGATIVE NEGATIVE Final   Influenza B by PCR NEGATIVE NEGATIVE Final    Comment: (NOTE) The Xpert Xpress SARS-CoV-2/FLU/RSV assay is intended as an aid in  the diagnosis of influenza from Nasopharyngeal swab specimens and  should not be used as a sole basis for treatment. Nasal washings and  aspirates are unacceptable for Xpert Xpress SARS-CoV-2/FLU/RSV  testing.  Fact Sheet for Patients: https://www.moore.com/https://www.fda.gov/media/142436/download  Fact  Sheet for Healthcare  Providers: https://www.young.biz/  This test is not yet approved or cleared by the [place] and  has been authorized for detection and/or diagnosis of SARS-CoV-2 by  FDA under an Emergency Use Authorization (EUA). This EUA will remain  in effect (meaning this test can be used) for the duration of the  Covid-19 declaration under Section 564(b)(1) of the Act, 21  U.S.C. section 360bbb-3(b)(1), unless the authorization is  terminated or revoked. Performed at [facility], [address] [place], [place] [address]   Respiratory Panel by RT PCR (Flu A&B, Covid) - Nasopharyngeal Swab     Status: None   Collection Time: 04/26/20  3:41 PM   Specimen: Nasopharyngeal Swab  Result Value Ref Range Status   SARS Coronavirus 2 by RT PCR NEGATIVE NEGATIVE Final    Comment: (NOTE) SARS-CoV-2 target nucleic acids are NOT DETECTED.  The SARS-CoV-2 RNA is generally detectable in upper respiratoy specimens during the acute phase of infection. The lowest concentration of SARS-CoV-2 viral copies this assay can detect is 131 copies/mL. A negative result does not preclude SARS-Cov-2 infection and should not be used as the sole basis for treatment or other patient management decisions. A negative result may occur with  improper specimen collection/handling, submission of specimen other than nasopharyngeal swab, presence of viral mutation(s) within the areas targeted by this assay, and inadequate number of viral copies (<131 copies/mL). A negative result must be combined with clinical observations, patient history, and epidemiological information. The expected result is Negative.  Fact Sheet for Patients:  https://www.moore.com/  Fact Sheet for Healthcare Providers:  https://www.young.biz/  This test is no t yet approved or cleared by the [place] FDA and  has been authorized for detection and/or diagnosis of SARS-CoV-2  by FDA under an Emergency Use Authorization (EUA). This EUA will remain  in effect (meaning this test can be used) for the duration of the COVID-19 declaration under Section 564(b)(1) of the Act, 21 U.S.C. section 360bbb-3(b)(1), unless the authorization is terminated or revoked sooner.     Influenza A by PCR NEGATIVE NEGATIVE Final   Influenza B by PCR NEGATIVE NEGATIVE Final    Comment: (NOTE) The Xpert Xpress SARS-CoV-2/FLU/RSV assay is intended as an aid in  the diagnosis of influenza from Nasopharyngeal swab specimens and  should not be used as a sole basis for treatment. Nasal washings and  aspirates are unacceptable for Xpert Xpress SARS-CoV-2/FLU/RSV  testing.  Fact Sheet for Patients: https://www.moore.com/  Fact Sheet for Healthcare Providers: https://www.young.biz/  This test is not yet approved or cleared by the [place] FDA and  has been authorized for detection and/or diagnosis of SARS-CoV-2 by  FDA under an Emergency Use Authorization (EUA). This EUA will remain  in effect (meaning this test can be used) for the duration of the  Covid-19 declaration under Section 564(b)(1) of the Act, 21  U.S.C. section 360bbb-3(b)(1), unless the authorization is  terminated or revoked. Performed at [facility], [address] [place], [place] [address]      Labs: BNP (last 3 results) No results for input(s): BNP in the last 8760 hours. Basic Metabolic Panel: Recent Labs  Lab 04/23/20 2300 04/24/20 2249 04/26/20 1732 04/27/20 0943  NA 138 138 139 140  K 3.6 3.9 3.9 3.7  CL 106 104 105 105  CO2 21* 25 24 24   GLUCOSE 113* 100* 100* 128*  BUN 16 12 18 16   CREATININE 0.98 0.98 1.02 1.11  CALCIUM 9.1 9.6 8.8* 8.9  MG  --   --   --  2.2   Liver Function Tests: Recent Labs  Lab 04/24/20 2249 04/26/20 1732 04/27/20 0943  AST 10* 10* 14*  ALT 11 10 9   ALKPHOS 66 57 61  BILITOT 0.8 0.6 0.7  PROT 7.8  6.6 6.7  ALBUMIN 4.7 4.0 4.1   No results for input(s): LIPASE, AMYLASE in the last 168 hours. No results for input(s): AMMONIA in the last 168 hours. CBC: Recent Labs  Lab 04/23/20 2300 04/24/20 2249 04/26/20 1732 04/27/20 0943  WBC 8.8 8.3 7.8 8.5  NEUTROABS  --   --  5.3 5.9  HGB 14.3 15.8 14.2 14.1  HCT 42.4 45.8 41.8 41.6  MCV 89.3 89.6 90.7 91.6  PLT 243 238 224 225   Cardiac Enzymes: Recent Labs  Lab 04/24/20 2249  CKTOTAL 39*   BNP: Invalid input(s): POCBNP CBG: Recent Labs  Lab 04/22/20 2343  GLUCAP 103*   D-Dimer No results for input(s): DDIMER in the last 72 hours. Hgb A1c No results for input(s): HGBA1C in the last 72 hours. Lipid Profile Recent Labs    04/27/20 0943  CHOL 104  HDL 34*  LDLCALC 55  TRIG 77  CHOLHDL 3.1   Thyroid function studies Recent Labs    04/27/20 0943  TSH 2.637   Anemia work up No results for input(s): VITAMINB12, FOLATE, FERRITIN, TIBC, IRON, RETICCTPCT in the last 72 hours. Urinalysis    Component Value Date/Time   COLORURINE YELLOW (A) 01/08/2018 2230   APPEARANCEUR CLEAR (A) 01/08/2018 2230   LABSPEC 1.021 01/08/2018 2230   PHURINE 5.0 01/08/2018 2230   GLUCOSEU NEGATIVE 01/08/2018 2230   HGBUR SMALL (A) 01/08/2018 2230   BILIRUBINUR NEGATIVE 01/08/2018 2230   KETONESUR NEGATIVE 01/08/2018 2230   PROTEINUR NEGATIVE 01/08/2018 2230   NITRITE NEGATIVE 01/08/2018 2230   LEUKOCYTESUR NEGATIVE 01/08/2018 2230   Sepsis Labs Invalid input(s): PROCALCITONIN,  WBC,  LACTICIDVEN Microbiology Recent Results (from the past 240 hour(s))  Respiratory Panel by RT PCR (Flu A&B, Covid) - Nasopharyngeal Swab     Status: None   Collection Time: 04/18/20  6:14 PM   Specimen: Nasopharyngeal Swab  Result Value Ref Range Status   SARS Coronavirus 2 by RT PCR NEGATIVE NEGATIVE Final    Comment: (NOTE) SARS-CoV-2 target nucleic acids are NOT DETECTED.  The SARS-CoV-2 RNA is generally detectable in upper  respiratoy specimens during the acute phase of infection. The lowest concentration of SARS-CoV-2 viral copies this assay can detect is 131 copies/mL. A negative result does not preclude SARS-Cov-2 infection and should not be used as the sole basis for treatment or other patient management decisions. A negative result may occur with  improper specimen collection/handling, submission of specimen other than nasopharyngeal swab, presence of viral mutation(s) within the areas targeted by this assay, and inadequate number of viral copies (<131 copies/mL). A negative result must be combined with clinical observations, patient history, and epidemiological information. The expected result is Negative.  Fact Sheet for Patients:  https://www.moore.com/  Fact Sheet for Healthcare Providers:  https://www.young.biz/  This test is no t yet approved or cleared by the [place] FDA and  has been authorized for detection and/or diagnosis of SARS-CoV-2 by FDA under an Emergency Use Authorization (EUA). This EUA will remain  in effect (meaning this test can be used) for the duration of the COVID-19 declaration under Section 564(b)(1) of the Act, 21 U.S.C. section 360bbb-3(b)(1), unless the authorization  is terminated or revoked sooner.     Influenza A by PCR NEGATIVE NEGATIVE Final   Influenza B by PCR NEGATIVE NEGATIVE Final    Comment: (NOTE) The Xpert Xpress SARS-CoV-2/FLU/RSV assay is intended as an aid in  the diagnosis of influenza from Nasopharyngeal swab specimens and  should not be used as a sole basis for treatment. Nasal washings and  aspirates are unacceptable for Xpert Xpress SARS-CoV-2/FLU/RSV  testing.  Fact Sheet for Patients: https://www.moore.com/  Fact Sheet for Healthcare Providers: https://www.young.biz/  This test is not yet approved or cleared by the [place] FDA and  has been  authorized for detection and/or diagnosis of SARS-CoV-2 by  FDA under an Emergency Use Authorization (EUA). This EUA will remain  in effect (meaning this test can be used) for the duration of the  Covid-19 declaration under Section 564(b)(1) of the Act, 21  U.S.C. section 360bbb-3(b)(1), unless the authorization is  terminated or revoked. Performed at [facility], [address], [place], [place] [address]   Respiratory Panel by RT PCR (Flu A&B, Covid) - Nasopharyngeal Swab     Status: None   Collection Time: 04/26/20  3:41 PM   Specimen: Nasopharyngeal Swab  Result Value Ref Range Status   SARS Coronavirus 2 by RT PCR NEGATIVE NEGATIVE Final    Comment: (NOTE) SARS-CoV-2 target nucleic acids are NOT DETECTED.  The SARS-CoV-2 RNA is generally detectable in upper respiratoy specimens during the acute phase of infection. The lowest concentration of SARS-CoV-2 viral copies this assay can detect is 131 copies/mL. A negative result does not preclude SARS-Cov-2 infection and should not be used as the sole basis for treatment or other patient management decisions. A negative result may occur with  improper specimen collection/handling, submission of specimen other than nasopharyngeal swab, presence of viral mutation(s) within the areas targeted by this assay, and inadequate number of viral copies (<131 copies/mL). A negative result must be combined with clinical observations, patient history, and epidemiological information. The expected result is Negative.  Fact Sheet for Patients:  https://www.moore.com/  Fact Sheet for Healthcare Providers:  https://www.young.biz/  This test is no t yet approved or cleared by the [place] FDA and  has been authorized for detection and/or diagnosis of SARS-CoV-2 by FDA under an Emergency Use Authorization (EUA). This EUA will remain  in effect (meaning this test can be used) for the duration of  the COVID-19 declaration under Section 564(b)(1) of the Act, 21 U.S.C. section 360bbb-3(b)(1), unless the authorization is terminated or revoked sooner.     Influenza A by PCR NEGATIVE NEGATIVE Final   Influenza B by PCR NEGATIVE NEGATIVE Final    Comment: (NOTE) The Xpert Xpress SARS-CoV-2/FLU/RSV assay is intended as an aid in  the diagnosis of influenza from Nasopharyngeal swab specimens and  should not be used as a sole basis for treatment. Nasal washings and  aspirates are unacceptable for Xpert Xpress SARS-CoV-2/FLU/RSV  testing.  Fact Sheet for Patients: https://www.moore.com/  Fact Sheet for Healthcare Providers: https://www.young.biz/  This test is not yet approved or cleared by the [place] FDA and  has been authorized for detection and/or diagnosis of SARS-CoV-2 by  FDA under an Emergency Use Authorization (EUA). This EUA will remain  in effect (meaning this test can be used) for the duration of the  Covid-19 declaration under Section 564(b)(1) of the Act, 21  U.S.C. section 360bbb-3(b)(1), unless the authorization is  terminated or revoked. Performed at [facility], [address], [place], [place] [address]      Time coordinating discharge: 32 minutes  SIGNED:   Dorcas Carrow, MD  [facility] 04/27/2020, 1:16 PM

## 2020-04-27 NOTE — Consult Note (Signed)
Neurology Consultation  Reason for Consult: Multiple seizures, in the setting of missing multiple doses of Depakote Referring Physician: Dorcas Carrow, MD  CC: Seizures  History is obtained from: Chart and patient  HPI: Ronald Butler is a 44 y.o. male with past medical history of pancreatitis, pneumothorax and also seizures.  Patient currently was in jail for the past 12 days.  Patient reports that there was confusion by jail staff with the correct dosage of medication and in addition at times he was not getting the medication.  He states that he went 9 days without the Depakote.  Patient reports having multiple seizures while in jail and they became more frequent.  He reports that he has been to the ED multiple times and for some reason or not he has been given prescriptions for Depakote 500 mg extended release instead of his normal 500 mg twice daily.  Over last 3 days he states that he has had multiple seizures possibly 9-12 seizures.  Yesterday at time of admission to emergency department he had had 2 seizures just prior.  There is some question if these possibly could be nonepileptic as staff reports that the episodes/seizure-like episodes have been aborted by truncal water on his face.  While in transit with EMS apparently he had 1 seizure and given 5 mg of Versed which she was completely alert and oriented and then had another seizure like episodes and was given another 5 mg of Versed.  By the time he came to the emergency department he was somnolent.  He is now complaining of also neck pain.  He states he has had multiple fusions in his neck and now is having a weakness on the left side of his arm and leg.  He denies having any weakness status post seizures.  He is very concerned that there could be some damage to the hardware.  Past Medical History:  Diagnosis Date  . Pancreatitis   . Pneumothorax   . Seizures (HCC)     Family History  Family history unknown: Yes   Social History:    reports that he has been smoking cigarettes. He has been smoking about 2.00 packs per day. He has never used smokeless tobacco. He reports previous alcohol use. He reports previous drug use. Drug: Marijuana.  Medications  Current Facility-Administered Medications:  .   stroke: mapping our early stages of recovery book, , Does not apply, Once, Zierle-Ghosh, Asia B, DO .  0.9 %  sodium chloride infusion, , Intravenous, Continuous, Zierle-Ghosh, Asia B, DO, Stopped at 04/26/20 1756 .  acetaminophen (TYLENOL) tablet 650 mg, 650 mg, Oral, Q6H PRN **OR** acetaminophen (TYLENOL) suppository 650 mg, 650 mg, Rectal, Q6H PRN, Zierle-Ghosh, Asia B, DO .  divalproex (DEPAKOTE) DR tablet 500 mg, 500 mg, Oral, Q12H, Ulice Dash, PA-C .  heparin injection 5,000 Units, 5,000 Units, Subcutaneous, Q8H, Zierle-Ghosh, Asia B, DO, 5,000 Units at 04/27/20 0604 .  ketorolac (TORADOL) 30 MG/ML injection 30 mg, 30 mg, Intravenous, Q6H PRN, Zierle-Ghosh, Asia B, DO, 30 mg at 04/27/20 0327 .  ondansetron (ZOFRAN) tablet 4 mg, 4 mg, Oral, Q6H PRN **OR** ondansetron (ZOFRAN) injection 4 mg, 4 mg, Intravenous, Q6H PRN, Zierle-Ghosh, Asia B, DO, 4 mg at 04/27/20 0329 .  oxyCODONE (Oxy IR/ROXICODONE) immediate release tablet 5 mg, 5 mg, Oral, Q4H PRN, Zierle-Ghosh, Asia B, DO, 5 mg at 04/27/20 0604 .  polyethylene glycol (MIRALAX / GLYCOLAX) packet 17 g, 17 g, Oral, Daily PRN, Zierle-Ghosh, Asia B, DO .  valproate (DEPACON) 1,000 mg in dextrose 5 % 50 mL IVPB, 1,000 mg, Intravenous, STAT, Ulice Dash, PA-C  Current Outpatient Medications:  .  divalproex (DEPAKOTE ER) 500 MG 24 hr tablet, Take 1 tablet (500 mg total) by mouth daily., Disp: 30 tablet, Rfl: 0  ROS:     General ROS: Positive for -"generally feels weird" Psychological ROS: negative for - behavioral disorder, hallucinations, memory difficulties, mood swings or suicidal ideation Ophthalmic ROS: negative for - blurry vision, double vision, eye pain or loss of  vision ENT ROS: negative for - epistaxis, nasal discharge, oral lesions, sore throat, tinnitus or vertigo Respiratory ROS: negative for - cough, hemoptysis, shortness of breath or wheezing Cardiovascular ROS: negative for - chest pain, dyspnea on exertion, edema or irregular heartbeat Gastrointestinal ROS: negative for - abdominal pain, diarrhea, hematemesis, nausea/vomiting or stool incontinence Genito-Urinary ROS: negative for - dysuria, hematuria, incontinence or urinary frequency/urgency Musculoskeletal ROS: Positive for -  muscular weakness Neurological ROS: as noted in HPI Dermatological ROS: negative for rash and skin lesion changes  Exam: Current vital signs: BP 117/80 (BP Location: Right Arm)   Pulse 76   Temp 98.9 F (37.2 C) (Oral)   Resp 16   SpO2 100%  Vital signs in last 24 hours: Temp:  [98.9 F (37.2 C)-99.4 F (37.4 C)] 98.9 F (37.2 C) (10/28 8469) Pulse Rate:  [63-88] 76 (10/28 0740) Resp:  [12-25] 16 (10/28 0740) BP: (106-136)/(74-94) 117/80 (10/28 0740) SpO2:  [96 %-100 %] 100 % (10/28 0740)   Constitutional: Appears well-developed and well-nourished.  Psych: Anxious Eyes: No scleral injection HENT: No OP obstrucion Head: Normocephalic.  Cardiovascular: Normal rate and regular rhythm.  Respiratory: Effort normal, non-labored breathing GI: Soft.  No distension. There is no tenderness.  Skin: WDI  Neuro: Mental Status: Patient is awake, alert, oriented to person, place, month, year, and situation. Speech-no dysarthria or aphasia.  Naming, repeating, comprehension intact.  Able to give a coherent history. Cranial Nerves: II: Visual Fields are full.  III,IV, VI: EOMI without ptosis or diploplia. Pupils equal, round and reactive to light V: Facial sensation is symmetric to temperature VII: Facial movement is symmetric.  VIII: hearing is intact to voice X: Palat elevates symmetrically XI: Shoulder shrug is symmetric. XII: tongue is midline without  atrophy or fasciculations.  Motor: Tone is normal. Bulk is normal. 5/5 strength on the right arm and leg.  4/5 left arm.  4/5 on left leg however there was some component of giveaway strength.is Sensory: Extremely mild to decrease sensation on the left arm and left leg.  Should be noticed that patient had to think very hard about if there was any decrease in sensation. Deep Tendon Reflexes: 2+ and symmetric in the biceps and patellae and also Achilles. Plantars: Toes are downgoing bilaterally.  Cerebellar: FNF intact with no dysmetria.  Unable to do heel-to-shin as he is in shackles.  Labs I have reviewed labs in epic and the results pertinent to this consultation are:   CBC    Component Value Date/Time   WBC 7.8 04/26/2020 1732   RBC 4.61 04/26/2020 1732   HGB 14.2 04/26/2020 1732   HCT 41.8 04/26/2020 1732   PLT 224 04/26/2020 1732   MCV 90.7 04/26/2020 1732   MCH 30.8 04/26/2020 1732   MCHC 34.0 04/26/2020 1732   RDW 12.7 04/26/2020 1732   LYMPHSABS 1.7 04/26/2020 1732   MONOABS 0.7 04/26/2020 1732   EOSABS 0.1 04/26/2020 1732   BASOSABS 0.0 04/26/2020 1732  CMP     Component Value Date/Time   NA 139 04/26/2020 1732   K 3.9 04/26/2020 1732   CL 105 04/26/2020 1732   CO2 24 04/26/2020 1732   GLUCOSE 100 (H) 04/26/2020 1732   BUN 18 04/26/2020 1732   CREATININE 1.02 04/26/2020 1732   CALCIUM 8.8 (L) 04/26/2020 1732   PROT 6.6 04/26/2020 1732   ALBUMIN 4.0 04/26/2020 1732   AST 10 (L) 04/26/2020 1732   ALT 10 04/26/2020 1732   ALKPHOS 57 04/26/2020 1732   BILITOT 0.6 04/26/2020 1732   GFRNONAA >60 04/26/2020 1732   GFRAA >60 04/05/2018 1417    Lipid Panel  No results found for: CHOL, TRIG, HDL, CHOLHDL, VLDL, LDLCALC, LDLDIRECT   Imaging I have reviewed the images obtained:  CT-scan of the brain-no evidence of acute intracranial abnormality  MRI examination of the brain/MRA of head-normal MRI/MRA of brain  Felicie Morn PA-C [facility] [phone]  M-F  (9:00 am- 5:00 PM)  04/27/2020, 9:47 AM     Assessment:  44 year old male who was brought to jail over the past 12 days apparently not getting his Depakote as prescribed well in jail.  Patient had multiple seizures and was brought to the ED.  Depakote level is subtherapeutic.  There is some question of possible nonepileptic spells as per staff patient would quickly come out of seizures/seizure-like activity when thrown water on his face.  Currently seizure-free.  Exam only shows mild weakness of left arm and weakness of left leg which has a component of giveaway weakness.  Impression: -Seizure versus nonepileptic spells  Recommendations: -Have loaded patient with 1000 mg Depakote IV -Put patient back on his regime of 500 mg Depakote twice daily with first dose tonight -On discharge make it very clear about schedule of Depakote and dosage of Depakote -Per [facility] statutes, patients with seizures are not allowed to drive until  they have been seizure-free for six months. Use caution when using heavy equipment or power tools. Avoid working on ladders or at heights. Take showers instead of baths. Ensure the water temperature is not too high on the home water heater. Do not go swimming alone. When caring for infants or small children, sit down when holding, feeding, or changing them to minimize risk of injury to the child in the event you have a seizure.   Also, Maintain good sleep hygiene. Avoid alcohol.

## 2020-04-27 NOTE — ED Notes (Signed)
Pt transported to MRI 

## 2020-04-27 NOTE — ED Notes (Signed)
ECHO Tech at bedside

## 2020-04-27 NOTE — ED Notes (Signed)
Unable to draw labs. Notified phlebotomy

## 2020-04-27 NOTE — Progress Notes (Signed)
Carotid artery duplex has been completed. Preliminary results can be found in CV Proc through chart review.   04/27/20 9:29 AM Olen Cordial RVT

## 2020-04-27 NOTE — ED Notes (Signed)
Pt provided breakfast tray.

## 2020-04-27 NOTE — Progress Notes (Signed)
  Echocardiogram 2D Echocardiogram has been performed.  Ronald Butler 04/27/2020, 9:04 AM

## 2020-04-28 LAB — HEMOGLOBIN A1C
Hgb A1c MFr Bld: 5.5 % (ref 4.8–5.6)
Mean Plasma Glucose: 111 mg/dL

## 2020-04-29 ENCOUNTER — Other Ambulatory Visit: Payer: Self-pay

## 2020-04-29 ENCOUNTER — Emergency Department (HOSPITAL_COMMUNITY)
Admission: EM | Admit: 2020-04-29 | Discharge: 2020-04-30 | Attending: Emergency Medicine | Admitting: Emergency Medicine

## 2020-04-29 ENCOUNTER — Encounter (HOSPITAL_COMMUNITY): Payer: Self-pay | Admitting: Emergency Medicine

## 2020-04-29 DIAGNOSIS — F17210 Nicotine dependence, cigarettes, uncomplicated: Secondary | ICD-10-CM | POA: Insufficient documentation

## 2020-04-29 DIAGNOSIS — R569 Unspecified convulsions: Secondary | ICD-10-CM | POA: Insufficient documentation

## 2020-04-29 NOTE — ED Triage Notes (Signed)
Pt BIB [facility] from jail. In custody. Jail staff states that they witnessed a grand mal seizure lasting 2-3 mins. Post ictal on EMS arrival. Incontinent. Recently adjusted his depakote. Patient states that he feels fine now and does not want to be here. CBG 92.

## 2020-04-30 LAB — CBC WITH DIFFERENTIAL/PLATELET
Abs Immature Granulocytes: 0.03 10*3/uL (ref 0.00–0.07)
Basophils Absolute: 0.1 10*3/uL (ref 0.0–0.1)
Basophils Relative: 1 %
Eosinophils Absolute: 0.1 10*3/uL (ref 0.0–0.5)
Eosinophils Relative: 1 %
HCT: 44.5 % (ref 39.0–52.0)
Hemoglobin: 15.1 g/dL (ref 13.0–17.0)
Immature Granulocytes: 0 %
Lymphocytes Relative: 31 %
Lymphs Abs: 2.7 10*3/uL (ref 0.7–4.0)
MCH: 30.8 pg (ref 26.0–34.0)
MCHC: 33.9 g/dL (ref 30.0–36.0)
MCV: 90.6 fL (ref 80.0–100.0)
Monocytes Absolute: 1 10*3/uL (ref 0.1–1.0)
Monocytes Relative: 11 %
Neutro Abs: 4.9 10*3/uL (ref 1.7–7.7)
Neutrophils Relative %: 56 %
Platelets: 253 10*3/uL (ref 150–400)
RBC: 4.91 MIL/uL (ref 4.22–5.81)
RDW: 12.8 % (ref 11.5–15.5)
WBC: 8.7 10*3/uL (ref 4.0–10.5)
nRBC: 0 % (ref 0.0–0.2)

## 2020-04-30 LAB — BASIC METABOLIC PANEL
Anion gap: 15 (ref 5–15)
BUN: 19 mg/dL (ref 6–20)
CO2: 22 mmol/L (ref 22–32)
Calcium: 9.8 mg/dL (ref 8.9–10.3)
Chloride: 106 mmol/L (ref 98–111)
Creatinine, Ser: 0.84 mg/dL (ref 0.61–1.24)
GFR, Estimated: >60 mL/min (ref >60)
Glucose, Bld: 95 mg/dL (ref 70–99)
Potassium: 3.9 mmol/L (ref 3.5–5.1)
Sodium: 143 mmol/L (ref 135–145)

## 2020-04-30 LAB — VALPROIC ACID LEVEL: Valproic Acid Lvl: 94 ug/mL (ref 50.0–100.0)

## 2020-04-30 MED ORDER — ONDANSETRON 4 MG PO TBDP
4.0000 mg | ORAL_TABLET | Freq: Once | ORAL | Status: AC
  Administered 2020-04-30: 4 mg via ORAL
  Filled 2020-04-30: qty 1

## 2020-04-30 MED ORDER — ACETAMINOPHEN 500 MG PO TABS
1000.0000 mg | ORAL_TABLET | Freq: Once | ORAL | Status: AC
  Administered 2020-04-30: 1000 mg via ORAL
  Filled 2020-04-30: qty 2

## 2020-04-30 MED ORDER — ZONISAMIDE 100 MG PO CAPS
300.0000 mg | ORAL_CAPSULE | Freq: Once | ORAL | Status: AC
  Administered 2020-04-30: 300 mg via ORAL
  Filled 2020-04-30: qty 3

## 2020-04-30 MED ORDER — ZONISAMIDE 100 MG PO CAPS
300.0000 mg | ORAL_CAPSULE | Freq: Every day | ORAL | 0 refills | Status: DC
Start: 2020-04-30 — End: 2020-12-15

## 2020-04-30 NOTE — ED Provider Notes (Signed)
[facility]-EMERGENCY DEPT Provider Note   CSN: [id number] Arrival date & time: 04/29/20  2333     History Chief Complaint  Patient presents with  . Seizures    Ronald Butler is a 44 y.o. male.  The history is provided by the patient and medical records.  Seizures   44 y.o. M with hx of seizure disorder, pancreatitis, presenting to the ED following seizures witnessed at jail.  Patient with multiple recent visits for same, along with recent admission 10/27-10/28 for same.  Had negative CT head, MRI/MRA brain, and MR cervical spine during that admission.  depakote dosing was adjusted at that time, patient without any missed doses since then.  Seizure today described as tonic/clonic lasting approx 2-3 mins by jail staff.  There was incontinence and post-ictal period.  Patient actually requested not to come to the ED as he feels his medication is just not up to "right level" yet in his body.  He states he feels fine other than headache which is typical for him following seizure.  Patient without any other complaints currently.  Past Medical History:  Diagnosis Date  . Pancreatitis   . Pneumothorax   . Seizures [facility])     Patient Active Problem List   Diagnosis Date Noted  . Witnessed seizure-like activity (HCC) 04/26/2020  . 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 APPENDECTOMY    . CHOLECYSTECTOMY         Family History  Family history unknown: Yes    Social History   Tobacco Use  . Smoking status: Current Every Day Smoker    Packs/day: 2.00    Types: Cigarettes  . Smokeless tobacco: Never Used  Vaping Use  . Vaping Use: Never used  Substance Use Topics  . Alcohol use: Not Currently    Comment: occassional  . Drug use: Not Currently    Types: Marijuana    Home Medications Prior to Admission medications   Medication Sig Start Date End Date Taking? Authorizing Provider    acetaminophen (TYLENOL) 325 MG tablet Take 2 tablets (650 mg total) by mouth every 6 (six) hours as needed for mild pain (or Fever >/= 101). 04/27/20   Dorcas Carrow, MD  divalproex (DEPAKOTE ER) 500 MG 24 hr tablet Take 1 tablet (500 mg total) by mouth in the morning and at bedtime. 04/27/20 07/26/20  Dorcas Carrow, MD  ondansetron (ZOFRAN) 4 MG tablet Take 1 tablet (4 mg total) by mouth daily as needed for up to 10 days for nausea or vomiting. 04/27/20 05/07/20  Dorcas Carrow, MD    Allergies    Patient has no known allergies.  Review of Systems   Review of Systems  Neurological: Positive for seizures.  All other systems reviewed and are negative.   Physical Exam Updated Vital Signs BP (!) 129/98 (BP Location: Left Arm)   Pulse (!) 103   Temp 98.5 F (36.9 C) (Oral)   Resp 18   SpO2 99%   Physical Exam Vitals and nursing note reviewed.  Constitutional:      Appearance: He is well-developed.  HENT:     Head: Normocephalic and atraumatic.     Comments: No visible head trauma    Mouth/Throat:     Comments: No tongue or dental injury noted Eyes:     Conjunctiva/sclera: Conjunctivae normal.     Pupils: Pupils are equal, round, and reactive to light.  Cardiovascular:  Rate and Rhythm: Normal rate and regular rhythm.     Heart sounds: Normal heart sounds.  Pulmonary:     Effort: Pulmonary effort is normal.     Breath sounds: Normal breath sounds.  Abdominal:     General: Bowel sounds are normal.     Palpations: Abdomen is soft.  Musculoskeletal:        General: Normal range of motion.     Cervical back: Normal range of motion.  Skin:    General: Skin is warm and dry.  Neurological:     Mental Status: He is alert and oriented to person, place, and time.     Comments: AAOx3, answering questions and following commands appropriately; equal strength UE and LE bilaterally; CN grossly intact; moves all extremities appropriately without ataxia; no focal neuro deficits or  facial asymmetry appreciated     ED Results / Procedures / Treatments   Labs (all labs ordered are listed, but only abnormal results are displayed) Labs Reviewed  CBC WITH DIFFERENTIAL/PLATELET  BASIC METABOLIC PANEL  VALPROIC ACID LEVEL    EKG EKG Interpretation  Date/Time:  Sunday April 30 2020 00:09:00 EDT Ventricular Rate:  95 PR Interval:    QRS Duration: 91 QT Interval:  355 QTC Calculation: 447 R Axis:   83 Text Interpretation: Sinus rhythm Borderline short PR interval Borderline T wave abnormalities When compared with ECG of 04/26/2020, No significant change was found Confirmed by Dione Booze ([id number]) on 04/30/2020 12:19:36 AM   Radiology No results found.  Procedures Procedures (including critical care time)  Medications Ordered in ED Medications  ondansetron (ZOFRAN-ODT) disintegrating tablet 4 mg (4 mg Oral Given 04/30/20 0036)  acetaminophen (TYLENOL) tablet 1,000 mg (1,000 mg Oral Given 04/30/20 0036)  zonisamide (ZONEGRAN) capsule 300 mg (300 mg Oral Given 04/30/20 3354)    ED Course  I have reviewed the triage vital signs and the nursing notes.  Pertinent labs & imaging results that were available during my care of the patient were reviewed by me and considered in my medical decision making (see chart for details).    MDM Rules/Calculators/A&P    44 year old male here with seizure-like activity from jail.  He has been incarcerated for about 10 days and has been here in the ED for 6 of them.  Recently admitted 10 27-10 28 for similar with negative work-up including multiple MRIs, CT of the head.  He was restarted on his Depakote and has been compliant.  Recurrent seizure activity today, sounds typical for him including 2 to 3 minutes of tonic-clonic seizure activity.  He did have bladder incontinence.  Here he is awake, alert, appropriately oriented.  No focal neurologic deficits.  Reports he has a mild headache which is typical for him after seizure.   Will obtain screening labs, Depakote level.  Requested Tylenol and Zofran which has been given.  Patient is labs today are reassuring.  His Depakote level is actually therapeutic at 94.  He is remained neurologically intact here without any recurrent seizure activity.  Discussed with on-call neurology, Dr. Dellis Anes options of adding second agent.  Discussed options of Keppra, however as patient does have a history of violent behavior, recommends to use zonisamide instead.  Start daily 300 mg QHS and can titrate up if needed.  Plan was discussed with patient he is agreeable.  Given first dose of zonisamide here in ED, discharged back to jail with prescription for same.  May return here for any new or acute changes.  Final  Clinical Impression(s) / ED Diagnoses Final diagnoses:  Seizure-like activity [facility])    Rx / DC Orders ED Discharge Orders         Ordered    zonisamide (ZONEGRAN) 100 MG capsule  Daily at bedtime        04/30/20 0353           Garlon Hatchet, PA-C 04/30/20 0410    Dione Booze, MD 04/30/20 [phone]

## 2020-04-30 NOTE — Discharge Instructions (Addendum)
Neurology recommends to continue depakote as scheduled, start zonisamide nighty.  You have already had dose for tonight. Make sure to get adequate sleep. Return here for any new/acute changes.

## 2020-05-04 ENCOUNTER — Emergency Department (HOSPITAL_COMMUNITY)

## 2020-05-04 ENCOUNTER — Encounter (HOSPITAL_COMMUNITY): Payer: Self-pay

## 2020-05-04 ENCOUNTER — Other Ambulatory Visit: Payer: Self-pay

## 2020-05-04 ENCOUNTER — Emergency Department (HOSPITAL_COMMUNITY)
Admission: EM | Admit: 2020-05-04 | Discharge: 2020-05-04 | Attending: Emergency Medicine | Admitting: Emergency Medicine

## 2020-05-04 DIAGNOSIS — R569 Unspecified convulsions: Secondary | ICD-10-CM | POA: Diagnosis present

## 2020-05-04 DIAGNOSIS — F17210 Nicotine dependence, cigarettes, uncomplicated: Secondary | ICD-10-CM | POA: Diagnosis not present

## 2020-05-04 MED ORDER — DIVALPROEX SODIUM ER 500 MG PO TB24: 500.0000 mg | ORAL_TABLET | Freq: Two times a day (BID) | ORAL | 2 refills | Status: DC

## 2020-05-04 MED ORDER — DIPHENHYDRAMINE HCL 50 MG/ML IJ SOLN
25.0000 mg | Freq: Once | INTRAMUSCULAR | Status: AC
  Administered 2020-05-04: 25 mg via INTRAMUSCULAR
  Filled 2020-05-04: qty 1

## 2020-05-04 MED ORDER — VALPROATE SODIUM 500 MG/5ML IV SOLN
1000.0000 mg | Freq: Once | INTRAVENOUS | Status: AC
  Administered 2020-05-04: 1000 mg via INTRAVENOUS
  Filled 2020-05-04: qty 10

## 2020-05-04 MED ORDER — PROCHLORPERAZINE EDISYLATE 10 MG/2ML IJ SOLN
10.0000 mg | Freq: Once | INTRAMUSCULAR | Status: AC
  Administered 2020-05-04: 10 mg via INTRAMUSCULAR
  Filled 2020-05-04: qty 2

## 2020-05-04 NOTE — Discharge Instructions (Signed)
Continue to take your Depakote as prescribed.  Follow-up with your neurologist.

## 2020-05-04 NOTE — ED Triage Notes (Signed)
Pt is from jail and isn't receiving his seizure meds, he was last here on Halloween for the same, the paperwork from jail states that he was suppose to start taking zonisamide and when it was started to stop the keppra Pt states that he wasn't getting anything Per EMS pt was postictal when they arrived and witnesses said he hit his forehead on the concrete

## 2020-05-04 NOTE — ED Provider Notes (Signed)
[facility]   CSN: [id number] Arrival date & time: 05/04/20  0021     History Chief Complaint  Patient presents with  . Seizures    Ronald Butler is a 44 y.o. male.  44 yo M with a cc of a seizure. Going on for the past few days.  Hx of seizures on depakote and transitioning to zomig.  Patient tells me that they have completely stopped his Depakote before starting the full dose of Zomig.  He thinks this is why has had a seizure.  Struck his head during the activity today.  Denies other injury.  Had some confusion and vomiting initially but none currently.  The history is provided by the patient.  Seizures Seizure activity on arrival: no   Seizure type:  Grand mal Initial focality:  None Episode characteristics: abnormal movements   Postictal symptoms: confusion   Return to baseline: yes   Severity:  Moderate Duration:  5 minutes Timing:  Once Number of seizures this episode:  1 Progression:  Resolved Recent head injury:  During the event PTA treatment:  None History of seizures: yes        Past Medical History:  Diagnosis Date  . Pancreatitis   . Pneumothorax   . Seizures [facility])     Patient Active Problem List   Diagnosis Date Noted  . Witnessed seizure-like activity (HCC) 04/26/2020  . 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 APPENDECTOMY    . CHOLECYSTECTOMY         Family History  Family history unknown: Yes    Social History   Tobacco Use  . Smoking status: Current Every Day Smoker    Packs/day: 2.00    Types: Cigarettes  . Smokeless tobacco: Never Used  Vaping Use  . Vaping Use: Never used  Substance Use Topics  . Alcohol use: Not Currently    Comment: occassional  . Drug use: Not Currently    Types: Marijuana    Home Medications Prior to Admission medications   Medication Sig Start Date End Date Taking? Authorizing  Provider  acetaminophen (TYLENOL) 325 MG tablet Take 2 tablets (650 mg total) by mouth every 6 (six) hours as needed for mild pain (or Fever >/= 101). 04/27/20   Dorcas Carrow, MD  divalproex (DEPAKOTE ER) 500 MG 24 hr tablet Take 1 tablet (500 mg total) by mouth in the morning and at bedtime. 05/04/20 08/02/20  Melene Plan, DO  ondansetron (ZOFRAN) 4 MG tablet Take 1 tablet (4 mg total) by mouth daily as needed for up to 10 days for nausea or vomiting. 04/27/20 05/07/20  Dorcas Carrow, MD  zonisamide (ZONEGRAN) 100 MG capsule Take 3 capsules (300 mg total) by mouth at bedtime. 04/30/20   Garlon Hatchet, PA-C    Allergies    Patient has no known allergies.  Review of Systems   Review of Systems  Constitutional: Negative for chills and fever.  HENT: Negative for congestion and facial swelling.   Eyes: Negative for discharge and visual disturbance.  Respiratory: Negative for shortness of breath.   Cardiovascular: Negative for chest pain and palpitations.  Gastrointestinal: Negative for abdominal pain, diarrhea and vomiting.  Musculoskeletal: Negative for arthralgias and myalgias.  Skin: Negative for color change and rash.  Neurological: Positive for seizures and headaches. Negative for tremors and syncope.  Psychiatric/Behavioral: Negative for confusion and dysphoric mood.    Physical Exam Updated Vital  Signs BP (!) 135/98   Pulse 74   Temp 98.8 F (37.1 C) (Oral)   Resp 16   Ht 5\' 11"  (1.803 m)   Wt 75 kg   SpO2 99%   BMI 23.06 kg/m   Physical Exam Vitals and nursing note reviewed.  Constitutional:      Appearance: He is well-developed.  HENT:     Head: Normocephalic and atraumatic.  Eyes:     Pupils: Pupils are equal, round, and reactive to light.  Neck:     Vascular: No JVD.  Cardiovascular:     Rate and Rhythm: Normal rate and regular rhythm.     Heart sounds: No murmur heard.  No friction rub. No gallop.   Pulmonary:     Effort: No respiratory distress.      Breath sounds: No wheezing.  Abdominal:     General: There is no distension.     Tenderness: There is no abdominal tenderness. There is no guarding or rebound.  Musculoskeletal:        General: Normal range of motion.     Cervical back: Normal range of motion and neck supple.  Skin:    Coloration: Skin is not pale.     Findings: No rash.  Neurological:     Mental Status: He is alert and oriented to person, place, and time.  Psychiatric:        Behavior: Behavior normal.     ED Results / Procedures / Treatments   Labs (all labs ordered are listed, but only abnormal results are displayed) Labs Reviewed - No data to display  EKG None  Radiology CT Head Wo Contrast  Result Date: 05/04/2020 CLINICAL DATA:  Seizure EXAM: CT HEAD WITHOUT CONTRAST TECHNIQUE: Contiguous axial images were obtained from the base of the skull through the vertex without intravenous contrast. COMPARISON:  04/26/2020 FINDINGS: Brain: No acute intracranial abnormality. Specifically, no hemorrhage, hydrocephalus, mass lesion, acute infarction, or significant intracranial injury. Vascular: No hyperdense vessel or unexpected calcification. Skull: No acute calvarial abnormality. Sinuses/Orbits: Visualized paranasal sinuses and mastoids clear. Orbital soft tissues unremarkable. Other: None IMPRESSION: Normal study. Electronically Signed   By: 04/28/2020 M.D.   On: 05/04/2020 02:04    Procedures Procedures (including critical care time)  Medications Ordered in ED Medications  prochlorperazine (COMPAZINE) injection 10 mg (10 mg Intramuscular Given 05/04/20 0122)  diphenhydrAMINE (BENADRYL) injection 25 mg (25 mg Intramuscular Given 05/04/20 0119)  valproate (DEPACON) 1,000 mg in dextrose 5 % 50 mL IVPB (0 mg Intravenous Stopped 05/04/20 0259)    ED Course  I have reviewed the triage vital signs and the nursing notes.  Pertinent labs & imaging results that were available during my care of the patient were reviewed  by me and considered in my medical decision making (see chart for details).    MDM Rules/Calculators/A&P                          44 yo M with a chief complaint of a seizure.  Patient has a known history of seizures and is on Depakote.  Per him the plan is the transition him to a Zomig for his understanding is that he is supposed be taking both of his medications.  Has been off of his Depakote and is having increased frequency of seizures.  We will load him on Depakote here.  Have him follow-up with his neurologist.  CT the head.  CT head negative.  D/c home.   3:41 AM:  I have discussed the diagnosis/risks/treatment options with the patient and believe the pt to be eligible for discharge home to follow-up with PCP. We also discussed returning to the ED immediately if new or worsening sx occur. We discussed the sx which are most concerning (e.g., sudden worsening pain, fever, inability to tolerate by mouth) that necessitate immediate return. Medications administered to the patient during their visit and any new prescriptions provided to the patient are listed below.  Medications given during this visit Medications  prochlorperazine (COMPAZINE) injection 10 mg (10 mg Intramuscular Given 05/04/20 0122)  diphenhydrAMINE (BENADRYL) injection 25 mg (25 mg Intramuscular Given 05/04/20 0119)  valproate (DEPACON) 1,000 mg in dextrose 5 % 50 mL IVPB (0 mg Intravenous Stopped 05/04/20 0259)     The patient appears reasonably screen and/or stabilized for discharge and I doubt any other medical condition or other [facility] requiring further screening, evaluation, or treatment in the ED at this time prior to discharge.     Final Clinical Impression(s) / ED Diagnoses Final diagnoses:  Seizure-like activity (HCC)    Rx / DC Orders ED Discharge Orders         Ordered    divalproex (DEPAKOTE ER) 500 MG 24 hr tablet  2 times daily        05/04/20 0214           Melene Plan, DO 05/04/20 [phone]

## 2020-05-05 ENCOUNTER — Other Ambulatory Visit: Payer: Self-pay

## 2020-05-05 ENCOUNTER — Emergency Department (HOSPITAL_COMMUNITY)

## 2020-05-05 ENCOUNTER — Emergency Department (HOSPITAL_COMMUNITY)
Admission: EM | Admit: 2020-05-05 | Discharge: 2020-05-06 | Disposition: A | Discharge status: Home / Self Care | Attending: Emergency Medicine | Admitting: Emergency Medicine

## 2020-05-05 ENCOUNTER — Encounter (HOSPITAL_COMMUNITY): Payer: Self-pay

## 2020-05-05 DIAGNOSIS — R569 Unspecified convulsions: Secondary | ICD-10-CM | POA: Diagnosis not present

## 2020-05-05 DIAGNOSIS — R000 Tachycardia, unspecified: Secondary | ICD-10-CM | POA: Insufficient documentation

## 2020-05-05 DIAGNOSIS — F17210 Nicotine dependence, cigarettes, uncomplicated: Secondary | ICD-10-CM | POA: Diagnosis not present

## 2020-05-05 LAB — RAPID URINE DRUG SCREEN, HOSP PERFORMED
Amphetamines: NOT DETECTED
Barbiturates: NOT DETECTED
Benzodiazepines: NOT DETECTED
Cocaine: NOT DETECTED
Opiates: NOT DETECTED
Tetrahydrocannabinol: NOT DETECTED

## 2020-05-05 LAB — VALPROIC ACID LEVEL: Valproic Acid Lvl: 78 ug/mL (ref 50.0–100.0)

## 2020-05-05 LAB — TSH: TSH: 2.174 u[IU]/mL (ref 0.350–4.500)

## 2020-05-05 MED ORDER — KETOROLAC TROMETHAMINE 30 MG/ML IJ SOLN
30.0000 mg | Freq: Once | INTRAMUSCULAR | Status: AC
  Administered 2020-05-06: 30 mg via INTRAVENOUS
  Filled 2020-05-05: qty 1

## 2020-05-05 MED ORDER — SODIUM CHLORIDE 0.9 % IV SOLN
200.0000 mg | Freq: Once | INTRAVENOUS | Status: AC
  Administered 2020-05-06: 200 mg via INTRAVENOUS
  Filled 2020-05-05: qty 20

## 2020-05-05 MED ORDER — ACETAMINOPHEN 325 MG PO TABS
650.0000 mg | ORAL_TABLET | Freq: Once | ORAL | Status: AC
  Administered 2020-05-05: 650 mg via ORAL
  Filled 2020-05-05: qty 2

## 2020-05-05 NOTE — ED Triage Notes (Addendum)
Per EMS[facility], found unresponsive in his cell, given 4mg  narcan IN. Hx epilepsy. Takes depakote and keppra. A&Ox4.

## 2020-05-05 NOTE — ED Notes (Signed)
Seizure pads on patients bed/ police man in patients room

## 2020-05-05 NOTE — ED Provider Notes (Signed)
[facility] Provider Note   CSN: [id number] Arrival date & time: 05/05/20  1813     History Chief Complaint  Patient presents with  . Seizures    Ronald Butler is a 44 y.o. male.  HPI Patient presents with tachycardia and reported seizure.  Has had recent seizures.  Seen 2 days ago for reported seizure.  Has had tachycardia.  States he woke up this morning with a heart rate of 140.  States due to the medical incompetence medical staff at the jail was not doing the right thing for him states they initially gave him Gatorade and did not even check his pulse.  States later they checked his pulse and told him that his blood pressure was low and his heart rate was high.  States they however did not send that with him with the paperwork.  States he is very worried about this.  Also states he had a seizure this afternoon.  States he is even upset because they gave him Narcan because he was unconscious.  States they should not have done this because he has seizures.  Has had recent seizures.  Recent admission the hospital had seizure versus nonepileptic spells.  However was subtherapeutic on his Depakote.  Loaded in the ER 2 days ago and since then has had 2 days of Depakote.  States that he is on the Depakote and gets some other pills that appear to be zonisamide.  States he has been getting these medications daily.  No injury.  No chest pain.  No trouble breathing.  No headache.  Does have previous history of pneumothorax.    Past Medical History:  Diagnosis Date  . Pancreatitis   . Pneumothorax   . Seizures [facility])     Patient Active Problem List   Diagnosis Date Noted  . Witnessed seizure-like activity (HCC) 04/26/2020  . 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 APPENDECTOMY    . CHOLECYSTECTOMY         Family History  Family history unknown: Yes    Social History   Tobacco Use   . Smoking status: Current Every Day Smoker    Packs/day: 2.00    Types: Cigarettes  . Smokeless tobacco: Never Used  Vaping Use  . Vaping Use: Never used  Substance Use Topics  . Alcohol use: Not Currently    Comment: occassional  . Drug use: Not Currently    Types: Marijuana    Home Medications Prior to Admission medications   Medication Sig Start Date End Date Taking? Authorizing Provider  acetaminophen (TYLENOL) 325 MG tablet Take 2 tablets (650 mg total) by mouth every 6 (six) hours as needed for mild pain (or Fever >/= 101). 04/27/20   Dorcas Carrow, MD  divalproex (DEPAKOTE ER) 500 MG 24 hr tablet Take 1 tablet (500 mg total) by mouth in the morning and at bedtime. 05/04/20 08/02/20  Melene Plan, DO  Lacosamide 100 MG TABS Take 1 tablet (100 mg total) by mouth 2 (two) times daily. 05/06/20   Benjiman Core, MD  ondansetron (ZOFRAN) 4 MG tablet Take 1 tablet (4 mg total) by mouth daily as needed for up to 10 days for nausea or vomiting. 04/27/20 05/07/20  Dorcas Carrow, MD  zonisamide (ZONEGRAN) 100 MG capsule Take 3 capsules (300 mg total) by mouth at bedtime. 04/30/20   Garlon Hatchet, PA-C    Allergies    Patient has no  known allergies.  Review of Systems   Review of Systems  Constitutional: Negative for appetite change and fever.  HENT: Negative for congestion.   Respiratory: Negative for cough.   Cardiovascular: Positive for palpitations.  Gastrointestinal: Negative for abdominal pain.  Genitourinary: Negative for flank pain.  Musculoskeletal: Negative for back pain.  Skin: Negative for rash.  Neurological: Positive for seizures.  Psychiatric/Behavioral: Negative for agitation.    Physical Exam Updated Vital Signs BP (!) 104/93 (BP Location: Right Arm)   Pulse 91   Temp 99.2 F (37.3 C) (Oral)   Resp 16   SpO2 100%   Physical Exam Vitals reviewed.  HENT:     Head: Normocephalic.     Mouth/Throat:     Mouth: Mucous membranes are moist.  Eyes:      Pupils: Pupils are equal, round, and reactive to light.  Cardiovascular:     Rate and Rhythm: Regular rhythm. Tachycardia present.  Pulmonary:     Breath sounds: Normal breath sounds.  Chest:     Chest wall: No tenderness.  Abdominal:     Tenderness: There is no abdominal tenderness.  Musculoskeletal:        General: No tenderness.     Cervical back: Neck supple.  Skin:    General: Skin is warm.     Capillary Refill: Capillary refill takes less than 2 seconds.  Neurological:     Mental Status: He is alert and oriented to person, place, and time.     ED Results / Procedures / Treatments   Labs (all labs ordered are listed, but only abnormal results are displayed) Labs Reviewed  VALPROIC ACID LEVEL  RAPID URINE DRUG SCREEN, HOSP PERFORMED  TSH    EKG EKG Interpretation  Date/Time:  Friday May 05 2020 19:29:40 EDT Ventricular Rate:  101 PR Interval:    QRS Duration: 89 QT Interval:  322 QTC Calculation: 418 R Axis:   87 Text Interpretation: Sinus tachycardia Confirmed by Benjiman Core [phone]) on 05/05/2020 9:23:06 PM   Radiology CT Head Wo Contrast  Result Date: 05/04/2020 CLINICAL DATA:  Seizure EXAM: CT HEAD WITHOUT CONTRAST TECHNIQUE: Contiguous axial images were obtained from the base of the skull through the vertex without intravenous contrast. COMPARISON:  04/26/2020 FINDINGS: Brain: No acute intracranial abnormality. Specifically, no hemorrhage, hydrocephalus, mass lesion, acute infarction, or significant intracranial injury. Vascular: No hyperdense vessel or unexpected calcification. Skull: No acute calvarial abnormality. Sinuses/Orbits: Visualized paranasal sinuses and mastoids clear. Orbital soft tissues unremarkable. Other: None IMPRESSION: Normal study. Electronically Signed   By: Charlett Nose M.D.   On: 05/04/2020 02:04   DG Chest Portable 1 View  Result Date: 05/05/2020 CLINICAL DATA:  Tachycardia. EXAM: PORTABLE CHEST 1 VIEW COMPARISON:  04/23/2020  FINDINGS: The heart size and mediastinal contours are within normal limits. Both lungs are clear. The visualized skeletal structures are unremarkable. IMPRESSION: No active disease. Electronically Signed   By: Marlan Palau M.D.   On: 05/05/2020 19:59    Procedures Procedures (including critical care time)  Medications Ordered in ED Medications  acetaminophen (TYLENOL) tablet 650 mg (650 mg Oral Given 05/05/20 2144)  lacosamide (VIMPAT) 200 mg in sodium chloride 0.9 % 25 mL IVPB (200 mg Intravenous New Bag/Given 05/06/20 0027)  ketorolac (TORADOL) 30 MG/ML injection 30 mg (30 mg Intravenous Given 05/06/20 0026)    ED Course  I have reviewed the triage vital signs and the nursing notes.  Pertinent labs & imaging results that were available during my care of  the patient were reviewed by me and considered in my medical decision making (see chart for details).    MDM Rules/Calculators/A&P                          Patient presents with possible seizure.  History of seizures.  Has been on Zonegran and Depakote.  However has had questionable breakthrough seizures and has had adjustment of medications and states that jail has not been giving him the medicines he needs.  However it appears over the last 2 days has been getting the medicines.  Although he states he was having a tachycardia today.  Discussed with Dr. Otelia Limes from neurology who reviewed the chart.  Will stop the Zonegran.  Will start Vimpat.  Vimpat will continue indefinitely.  Will need more neurology follow-up.  We will not adjust Depakote as the level is therapeutic at this time.  He had been well controlled on Depakote previously. Final Clinical Impression(s) / ED Diagnoses Final diagnoses:  Seizure [facility])    Rx / DC Orders ED Discharge Orders         Ordered    Lacosamide 100 MG TABS  2 times daily        05/06/20 0105           Benjiman Core, MD 05/06/20 [phone]

## 2020-05-06 MED ORDER — LACOSAMIDE 100 MG PO TABS
100.0000 mg | ORAL_TABLET | Freq: Two times a day (BID) | ORAL | 1 refills | Status: DC
Start: 2020-05-06 — End: 2020-12-11

## 2020-05-06 NOTE — ED Notes (Signed)
Pt has not had seizure activity for the duration of the shift

## 2020-05-06 NOTE — Discharge Instructions (Addendum)
Follow-up with neurology.  Start taking Vimpat 100 mg twice a day.  Stop the Zonisamide. Continue the Depakote at 500mg  twice a day.

## 2020-05-09 ENCOUNTER — Other Ambulatory Visit: Payer: Self-pay

## 2020-05-09 ENCOUNTER — Emergency Department (HOSPITAL_COMMUNITY)
Admission: EM | Admit: 2020-05-09 | Discharge: 2020-05-10 | Attending: Emergency Medicine | Admitting: Emergency Medicine

## 2020-05-09 DIAGNOSIS — M542 Cervicalgia: Secondary | ICD-10-CM | POA: Diagnosis not present

## 2020-05-09 DIAGNOSIS — Z79899 Other long term (current) drug therapy: Secondary | ICD-10-CM | POA: Insufficient documentation

## 2020-05-09 DIAGNOSIS — R569 Unspecified convulsions: Secondary | ICD-10-CM | POA: Diagnosis present

## 2020-05-09 DIAGNOSIS — R519 Headache, unspecified: Secondary | ICD-10-CM | POA: Insufficient documentation

## 2020-05-09 DIAGNOSIS — F17210 Nicotine dependence, cigarettes, uncomplicated: Secondary | ICD-10-CM | POA: Diagnosis not present

## 2020-05-09 NOTE — ED Triage Notes (Signed)
[facility], currently post ictal - v/s stable. 20 left ac. staff at prison reports seizure-like activity around 9:30pn. hx of seizures. meds not given. BGl 94mg /dl.

## 2020-05-10 ENCOUNTER — Emergency Department (HOSPITAL_COMMUNITY)

## 2020-05-10 LAB — CBC WITH DIFFERENTIAL/PLATELET
Abs Immature Granulocytes: 0.02 10*3/uL (ref 0.00–0.07)
Basophils Absolute: 0 10*3/uL (ref 0.0–0.1)
Basophils Relative: 1 %
Eosinophils Absolute: 0 10*3/uL (ref 0.0–0.5)
Eosinophils Relative: 0 %
HCT: 38.7 % — ABNORMAL LOW (ref 39.0–52.0)
Hemoglobin: 13.1 g/dL (ref 13.0–17.0)
Immature Granulocytes: 0 %
Lymphocytes Relative: 31 %
Lymphs Abs: 2.2 10*3/uL (ref 0.7–4.0)
MCH: 31 pg (ref 26.0–34.0)
MCHC: 33.9 g/dL (ref 30.0–36.0)
MCV: 91.5 fL (ref 80.0–100.0)
Monocytes Absolute: 0.6 10*3/uL (ref 0.1–1.0)
Monocytes Relative: 9 %
Neutro Abs: 4.3 10*3/uL (ref 1.7–7.7)
Neutrophils Relative %: 59 %
Platelets: 207 10*3/uL (ref 150–400)
RBC: 4.23 MIL/uL (ref 4.22–5.81)
RDW: 12.5 % (ref 11.5–15.5)
WBC: 7.3 10*3/uL (ref 4.0–10.5)
nRBC: 0 % (ref 0.0–0.2)

## 2020-05-10 LAB — BASIC METABOLIC PANEL
Anion gap: 9 (ref 5–15)
BUN: 21 mg/dL — ABNORMAL HIGH (ref 6–20)
CO2: 24 mmol/L (ref 22–32)
Calcium: 9.1 mg/dL (ref 8.9–10.3)
Chloride: 109 mmol/L (ref 98–111)
Creatinine, Ser: 0.92 mg/dL (ref 0.61–1.24)
GFR, Estimated: >60 mL/min (ref >60)
Glucose, Bld: 98 mg/dL (ref 70–99)
Potassium: 3.7 mmol/L (ref 3.5–5.1)
Sodium: 142 mmol/L (ref 135–145)

## 2020-05-10 LAB — VALPROIC ACID LEVEL: Valproic Acid Lvl: 42 ug/mL — ABNORMAL LOW (ref 50.0–100.0)

## 2020-05-10 MED ORDER — DIVALPROEX SODIUM 250 MG PO DR TAB
500.0000 mg | DELAYED_RELEASE_TABLET | Freq: Once | ORAL | Status: AC
  Administered 2020-05-10: 500 mg via ORAL
  Filled 2020-05-10: qty 2

## 2020-05-10 MED ORDER — ACETAMINOPHEN 325 MG PO TABS
650.0000 mg | ORAL_TABLET | Freq: Once | ORAL | Status: AC
  Administered 2020-05-10: 650 mg via ORAL
  Filled 2020-05-10: qty 2

## 2020-05-10 MED ORDER — SODIUM CHLORIDE 0.9 % IV SOLN
500.0000 mg | Freq: Once | INTRAVENOUS | Status: DC
  Filled 2020-05-10: qty 10

## 2020-05-10 NOTE — ED Notes (Signed)
ED Provider at bedside. 

## 2020-05-10 NOTE — ED Notes (Signed)
Pt reports taking Depakote 500mg  BID but was not able to take dose in the afternoon. Warm blanket provided to pt. Officer/ jail guard remains at bedside.

## 2020-05-10 NOTE — Discharge Instructions (Addendum)
Make sure that you are taking medications as prescribed for seizures.

## 2020-05-10 NOTE — ED Provider Notes (Addendum)
[facility] EMERGENCY DEPARTMENT Provider Note   CSN: [id number] Arrival date & time:        History Chief Complaint  Patient presents with  . Seizures    Ronald Butler is a 44 y.o. male.  HPI     44 year old male with history of seizures and pancreatitis who presents from prison with concern for seizure-like activity.  Patient is awake, alert, oriented.  2 officers are present at the bedside.  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  He states his last seizure was last night.  Headache and neck pain.  Unknown whether he sustained trauma.  Of note, patient has had 8 ER evaluations for seizure-like activity since October 19.  During those evaluations he has had 3 CT scans of the head and 2 MRIs.  Past Medical History:  Diagnosis Date  . Pancreatitis   . Pneumothorax   . Seizures [facility])     Patient Active Problem List   Diagnosis Date Noted  . Witnessed seizure-like activity (HCC) 04/26/2020  . 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 APPENDECTOMY    . CHOLECYSTECTOMY         Family History  Family history unknown: Yes    Social History   Tobacco Use  . Smoking status: Current Every Day Smoker    Packs/day: 2.00    Types: Cigarettes  . Smokeless tobacco: Never Used  Vaping Use  . Vaping Use: Never used  Substance Use Topics  . Alcohol use: Not Currently    Comment: occassional  . Drug use: Not Currently    Types: Marijuana    Home Medications Prior to Admission medications   Medication Sig Start Date End Date Taking? Authorizing Provider  acetaminophen (TYLENOL) 325 MG tablet Take 2  tablets (650 mg total) by mouth every 6 (six) hours as needed for mild pain (or Fever >/= 101). 04/27/20   Dorcas Carrow, MD  divalproex (DEPAKOTE ER) 500 MG 24 hr tablet Take 1 tablet (500 mg total) by mouth in the morning and at bedtime. 05/04/20 08/02/20  Melene Plan, DO  Lacosamide 100 MG TABS Take 1 tablet (100 mg total) by mouth 2 (two) times daily. 05/06/20   Benjiman Core, MD  zonisamide (ZONEGRAN) 100 MG capsule Take 3 capsules (300 mg total) by mouth at bedtime. 04/30/20   Garlon Hatchet, PA-C    Allergies    Patient has no known allergies.  Review of Systems   Review of Systems  Constitutional: Negative for fever.  Respiratory: Negative for chest tightness.   Cardiovascular: Negative for chest pain.  Gastrointestinal: Negative for abdominal pain.  Musculoskeletal: Positive for neck pain.  Neurological: Positive for seizures and headaches.  All other systems reviewed and are negative.   Physical Exam Updated Vital Signs BP 124/84 (BP Location: Right Arm)   Pulse (!) 106   Temp 98.3 F (36.8 C) (Oral)   Resp 17   Ht 1.803 m (5\' 11" )   Wt 75 kg   SpO2 100%   BMI 23.06 kg/m   Physical Exam Vitals and nursing note reviewed.  Constitutional:  Appearance: He is well-developed. He is not ill-appearing.  HENT:     Head: Normocephalic and atraumatic.     Nose: Nose normal.     Mouth/Throat:     Mouth: Mucous membranes are moist.  Eyes:     Pupils: Pupils are equal, round, and reactive to light.  Cardiovascular:     Rate and Rhythm: Normal rate and regular rhythm.     Heart sounds: Normal heart sounds. No murmur heard.   Pulmonary:     Effort: Pulmonary effort is normal. No respiratory distress.     Breath sounds: Normal breath sounds. No wheezing.  Abdominal:     General: Bowel sounds are normal.     Palpations: Abdomen is soft.     Tenderness: There is no abdominal tenderness. There is no rebound.  Musculoskeletal:        General: No deformity.      Cervical back: Neck supple.  Lymphadenopathy:     Cervical: No cervical adenopathy.  Skin:    General: Skin is warm and dry.  Neurological:     Mental Status: He is alert and oriented to person, place, and time.     Comments: Cranial nerves II through XII intact, 5 out of 5 strength in all 4 extremities, no dysmetria to finger-nose-finger  Psychiatric:        Mood and Affect: Mood normal.     ED Results / Procedures / Treatments   Labs (all labs ordered are listed, but only abnormal results are displayed) Labs Reviewed  CBC WITH DIFFERENTIAL/PLATELET - Abnormal; Notable for the following components:      Result Value   HCT 38.7 (*)    All other components within normal limits  BASIC METABOLIC PANEL - Abnormal; Notable for the following components:   BUN 21 (*)    All other components within normal limits  VALPROIC ACID LEVEL - Abnormal; Notable for the following components:   Valproic Acid Lvl 42 (*)    All other components within normal limits    EKG EKG Interpretation  Date/Time:  Wednesday May 10 2020 00:02:24 EST Ventricular Rate:  98 PR Interval:    QRS Duration: 90 QT Interval:  347 QTC Calculation: 443 R Axis:   91 Text Interpretation: Sinus rhythm Borderline right axis deviation Confirmed by Ross Marcus ([id number]) on 05/10/2020 12:20:38 AM   Radiology CT Head Wo Contrast  Result Date: 05/10/2020 CLINICAL DATA:  Seizure-like activity. EXAM: CT HEAD WITHOUT CONTRAST TECHNIQUE: Contiguous axial images were obtained from the base of the skull through the vertex without intravenous contrast. COMPARISON:  CT 05/04/2020, MR 04/26/2020 FINDINGS: Brain: No evidence of acute infarction, hemorrhage, hydrocephalus, extra-axial collection, visible mass lesion or mass effect. Vascular: No hyperdense vessel or unexpected calcification. Skull: No calvarial fracture or suspicious osseous lesion. No scalp swelling or hematoma. Sinuses/Orbits: Paranasal sinuses and mastoid  air cells are predominantly clear. Included orbital structures are unremarkable. Other: None. IMPRESSION: Normal noncontrast CT appearance of the brain. Electronically Signed   By: Kreg Shropshire M.D.   On: 05/10/2020 01:47    Procedures Procedures (including critical care time)  Medications Ordered in ED Medications  acetaminophen (TYLENOL) tablet 650 mg (650 mg Oral Given 05/10/20 0148)  divalproex (DEPAKOTE) DR tablet 500 mg (500 mg Oral Given 05/10/20 0215)    ED Course  I have reviewed the triage vital signs and the nursing notes.  Pertinent labs & imaging results that were available during my care of the patient were reviewed by  me and considered in my medical decision making (see chart for details).    MDM Rules/Calculators/A&P                          Patient presents with seizure activity from jail.  He is overall nontoxic and vital signs are notable for mild tachycardia.  He is not postictal and is able to provide history.  No one is available to provide collateral information.  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  His ER visits have increased since being incarcerated.  Question true seizure although without collateral information this is difficult to know..  Will obtain basic metabolic labs and EKG.  Given headache and unknown trauma, will obtain head CT as well.  Labs reviewed without significant metabolic derangement.  Depakote level is slightly low at 42.  There is a Sport and exercise psychologist of IV Depakote.  Will load with daily dose of 500 p.o.  EKG shows no evidence of acute ischemia or arrhythmia.  Stressed with patient the importance of continuing his Depakote in prison as prescribed.  No further seizure activity witnessed while in the emergency department.  After history, exam, and medical workup I feel the patient has been appropriately medically screened and is safe for discharge home. Pertinent diagnoses were discussed with the patient. Patient was given return  precautions.  Final Clinical Impression(s) / ED Diagnoses Final diagnoses:  Seizure-like activity [facility])    Rx / DC Orders ED Discharge Orders    None       Hanley Rispoli, Mayer Masker, MD 05/10/20 1540    Shon Baton, MD 05/10/20 [phone]

## 2020-05-30 ENCOUNTER — Emergency Department (HOSPITAL_COMMUNITY)
Admission: EM | Admit: 2020-05-30 | Discharge: 2020-05-31 | Disposition: A | Discharge status: Home / Self Care | Attending: Emergency Medicine | Admitting: Emergency Medicine

## 2020-05-30 ENCOUNTER — Encounter (HOSPITAL_COMMUNITY): Payer: Self-pay | Admitting: Emergency Medicine

## 2020-05-30 ENCOUNTER — Other Ambulatory Visit: Payer: Self-pay

## 2020-05-30 ENCOUNTER — Emergency Department (HOSPITAL_COMMUNITY)

## 2020-05-30 DIAGNOSIS — R112 Nausea with vomiting, unspecified: Secondary | ICD-10-CM | POA: Insufficient documentation

## 2020-05-30 DIAGNOSIS — F17210 Nicotine dependence, cigarettes, uncomplicated: Secondary | ICD-10-CM | POA: Insufficient documentation

## 2020-05-30 DIAGNOSIS — W06XXXA Fall from bed, initial encounter: Secondary | ICD-10-CM | POA: Diagnosis not present

## 2020-05-30 DIAGNOSIS — Y92149 Unspecified place in prison as the place of occurrence of the external cause: Secondary | ICD-10-CM | POA: Diagnosis not present

## 2020-05-30 DIAGNOSIS — R569 Unspecified convulsions: Secondary | ICD-10-CM | POA: Insufficient documentation

## 2020-05-30 DIAGNOSIS — R519 Headache, unspecified: Secondary | ICD-10-CM | POA: Insufficient documentation

## 2020-05-30 DIAGNOSIS — R110 Nausea: Secondary | ICD-10-CM

## 2020-05-30 DIAGNOSIS — G44319 Acute post-traumatic headache, not intractable: Secondary | ICD-10-CM

## 2020-05-30 LAB — COMPREHENSIVE METABOLIC PANEL
ALT: 17 U/L (ref 0–44)
AST: 11 U/L — ABNORMAL LOW (ref 15–41)
Albumin: 4.2 g/dL (ref 3.5–5.0)
Alkaline Phosphatase: 62 U/L (ref 38–126)
Anion gap: 9 (ref 5–15)
BUN: 22 mg/dL — ABNORMAL HIGH (ref 6–20)
CO2: 27 mmol/L (ref 22–32)
Calcium: 8.9 mg/dL (ref 8.9–10.3)
Chloride: 106 mmol/L (ref 98–111)
Creatinine, Ser: 0.83 mg/dL (ref 0.61–1.24)
GFR, Estimated: >60 mL/min (ref >60)
Glucose, Bld: 85 mg/dL (ref 70–99)
Potassium: 3.9 mmol/L (ref 3.5–5.1)
Sodium: 142 mmol/L (ref 135–145)
Total Bilirubin: 0.5 mg/dL (ref 0.3–1.2)
Total Protein: 6.7 g/dL (ref 6.5–8.1)

## 2020-05-30 LAB — CBC
HCT: 38.8 % — ABNORMAL LOW (ref 39.0–52.0)
Hemoglobin: 13.5 g/dL (ref 13.0–17.0)
MCH: 32.5 pg (ref 26.0–34.0)
MCHC: 34.8 g/dL (ref 30.0–36.0)
MCV: 93.3 fL (ref 80.0–100.0)
Platelets: 191 10*3/uL (ref 150–400)
RBC: 4.16 MIL/uL — ABNORMAL LOW (ref 4.22–5.81)
RDW: 13.7 % (ref 11.5–15.5)
WBC: 5.2 10*3/uL (ref 4.0–10.5)
nRBC: 0 % (ref 0.0–0.2)

## 2020-05-30 LAB — LIPASE, BLOOD: Lipase: 29 U/L (ref 11–51)

## 2020-05-30 MED ORDER — ONDANSETRON HCL 4 MG/2ML IJ SOLN
4.0000 mg | Freq: Once | INTRAMUSCULAR | Status: AC | PRN
  Administered 2020-05-30: 4 mg via INTRAVENOUS
  Filled 2020-05-30: qty 2

## 2020-05-30 NOTE — ED Triage Notes (Signed)
Patient from [facility] jail complaining of headache, nausea, vomiting, and he hit his head on something at the jail. He states that the back of his head is hurting.

## 2020-05-31 LAB — ETHANOL: Alcohol, Ethyl (B): <10 mg/dL (ref <10)

## 2020-05-31 LAB — URINALYSIS, ROUTINE W REFLEX MICROSCOPIC
Bilirubin Urine: NEGATIVE
Glucose, UA: NEGATIVE mg/dL
Hgb urine dipstick: NEGATIVE
Ketones, ur: NEGATIVE mg/dL
Leukocytes,Ua: NEGATIVE
Nitrite: NEGATIVE
Protein, ur: NEGATIVE mg/dL
Specific Gravity, Urine: 1.021 (ref 1.005–1.030)
pH: 6 (ref 5.0–8.0)

## 2020-05-31 LAB — MAGNESIUM: Magnesium: 2 mg/dL (ref 1.7–2.4)

## 2020-05-31 LAB — PHENYTOIN LEVEL, TOTAL: Phenytoin Lvl: 12.1 ug/mL (ref 10.0–20.0)

## 2020-05-31 LAB — RAPID URINE DRUG SCREEN, HOSP PERFORMED
Amphetamines: NOT DETECTED
Barbiturates: NOT DETECTED
Benzodiazepines: NOT DETECTED
Cocaine: NOT DETECTED
Opiates: NOT DETECTED
Tetrahydrocannabinol: NOT DETECTED

## 2020-05-31 LAB — VALPROIC ACID LEVEL: Valproic Acid Lvl: 47 ug/mL — ABNORMAL LOW (ref 50.0–100.0)

## 2020-05-31 MED ORDER — DIVALPROEX SODIUM ER 250 MG PO TB24: ORAL_TABLET | ORAL | 0 refills | Status: DC

## 2020-05-31 MED ORDER — DIVALPROEX SODIUM 500 MG PO DR TAB
750.0000 mg | DELAYED_RELEASE_TABLET | Freq: Once | ORAL | Status: AC
  Administered 2020-05-31: 750 mg via ORAL
  Filled 2020-05-31: qty 1

## 2020-05-31 MED ORDER — ACETAMINOPHEN 500 MG PO TABS
1000.0000 mg | ORAL_TABLET | Freq: Once | ORAL | Status: AC
  Administered 2020-05-31: 1000 mg via ORAL
  Filled 2020-05-31: qty 2

## 2020-05-31 NOTE — ED Provider Notes (Signed)
[facility] Provider Note   CSN: [id number] Arrival date & time: 05/30/20  2205     History Chief Complaint  Patient presents with  . Nausea  . Headache    Ronald Butler is a 44 y.o. male.  R[facility] Butler is a 44 y.o. male with a history of seizures, pancreatitis and pneumothorax, who [facility] of [facility] and nausea.  Patient states that on Sunday he hit his head when he fell out of the bed while having a seizure reportedly.  He states that the medical staff at the jail checked him out, and moved him up to a separate room for monitoring, patient reports that he had multiple additional seizures yesterday, but did not fall or hit his head again.  Jail staff that is present here with him was not there to witness this and there is no one else available to provide collateral.  He reports that today he has not had any additional seizures but is complaining of a headache with some nausea and one episode of vomiting.  He reports posterior headache.  He states it is a constant throbbing ache.  States that he has had some intermittent blurred vision.  Denies dizziness or focal numbness or weakness.  Patient states that at one point his left leg felt "funny" but he is not able to further describe this.  He denies any other injuries from the fall or his seizure is not having pain elsewhere.  Denies fevers or recent illness.  No chest pain or shortness of breath, no abdominal pain.  No pain in his extremities.  Patient reports that he takes Depakote and Dilantin for his seizures, is unaware of other seizure medications, states that they have Butler adjusting them frequently at the jail.  Patient has Butler seen numerous times recently for similar seizure episodes.  He reports that he has not had any seizures today.  I spoke with nursing staff at the jail who reported that patient is on Depakote 500 mg twice  daily, Dilantin 100 mg 3 times daily, zonisamide 300 mg twice daily and Keppra 250 mg twice daily.  The history is provided by the patient and medical records.       Past Medical History:  Diagnosis Date  . Pancreatitis   . Pneumothorax   . Seizures [facility](HCC)     Patient Active Problem List   Diagnosis Date Noted  . Witnessed seizure-like activity (HCC) 04/26/2020  . 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 APPENDECTOMY    . CHOLECYSTECTOMY         Family History  Family history unknown: Yes    Social History   Tobacco Use  . Smoking status: Current Every Day Smoker    Packs/day: 2.00    Types: Cigarettes  . Smokeless tobacco: Never Used  Vaping Use  . Vaping Use: Never used  Substance Use Topics  . Alcohol use: Not Currently    Comment: occassional  . Drug use: Not Currently    Types: Marijuana    Home Medications Prior to Admission medications   Medication Sig Start Date End Date Taking? Authorizing Provider  acetaminophen (TYLENOL) 325 MG tablet Take 2 tablets (650 mg total) by mouth every 6 (six) hours as needed for mild pain (or Fever >/= 101). 04/27/20   Dorcas CarrowGhimire, Kuber, MD  divalproex (DEPAKOTE ER) 500 MG 24 hr tablet Take  1 tablet (500 mg total) by mouth in the morning and at bedtime. 05/04/20 08/02/20  Melene Plan, DO  Lacosamide 100 MG TABS Take 1 tablet (100 mg total) by mouth 2 (two) times daily. 05/06/20   Benjiman Core, MD  zonisamide (ZONEGRAN) 100 MG capsule Take 3 capsules (300 mg total) by mouth at bedtime. 04/30/20   Garlon Hatchet, PA-C    Allergies    Patient has no known allergies.  Review of Systems   Review of Systems  Constitutional: Negative for chills and fever.  HENT: Negative.   Eyes: Positive for visual disturbance.  Respiratory: Negative for cough and shortness of breath.   Cardiovascular: Negative for chest pain.  Gastrointestinal: Positive for nausea and  vomiting. Negative for abdominal pain.  Genitourinary: Negative for dysuria and frequency.  Musculoskeletal: Negative for arthralgias, back pain, myalgias and neck pain.  Skin: Negative for color change, rash and wound.  Neurological: Positive for seizures and headaches. Negative for dizziness, syncope, weakness, light-headedness and numbness.  All other systems reviewed and are negative.   Physical Exam Updated Vital Signs BP 105/83   Pulse 85   Temp 98 F (36.7 C) (Oral)   Resp 14   Ht 5\' 11"  (1.803 m)   Wt 75 kg   SpO2 98%   BMI 23.06 kg/m   Physical Exam Vitals and nursing note reviewed.  Constitutional:      General: He is not in acute distress.    Appearance: He is well-developed. He is not ill-appearing or diaphoretic.     Comments: Well-appearing and in no distress  HENT:     Head: Normocephalic and atraumatic.     Comments: No appreciable hematoma, deformity or step-off, negative battle sign    Mouth/Throat:     Mouth: Mucous membranes are moist.     Pharynx: Oropharynx is clear.  Eyes:     General:        Right eye: No discharge.        Left eye: No discharge.     Extraocular Movements: Extraocular movements intact.     Right eye: No nystagmus.     Left eye: No nystagmus.     Pupils: Pupils are equal, round, and reactive to light.  Cardiovascular:     Rate and Rhythm: Normal rate and regular rhythm.     Heart sounds: Normal heart sounds. No murmur heard.  No friction rub.  Pulmonary:     Effort: Pulmonary effort is normal. No respiratory distress.     Breath sounds: Normal breath sounds. No wheezing or rales.     Comments: Respirations equal and unlabored, patient able to speak in full sentences, lungs clear to auscultation bilaterally Abdominal:     General: Bowel sounds are normal. There is no distension.     Palpations: Abdomen is soft. There is no mass.     Tenderness: There is no abdominal tenderness. There is no guarding.     Comments: Abdomen  soft, nondistended, nontender to palpation in all quadrants without guarding or peritoneal signs  Musculoskeletal:        General: No deformity.     Cervical back: Normal range of motion and neck supple.  Skin:    General: Skin is warm and dry.     Capillary Refill: Capillary refill takes less than 2 seconds.  Neurological:     Mental Status: He is alert.     Coordination: Coordination normal.     Comments: Speech is clear, able to  follow commands CN III-XII intact Normal strength in upper and lower extremities bilaterally including dorsiflexion and plantar flexion, strong and equal grip strength Sensation normal to light and sharp touch Moves extremities without ataxia, coordination intact No pronator drift  Psychiatric:        Mood and Affect: Mood normal.        Behavior: Behavior normal.     ED Results / Procedures / Treatments   Labs (all labs ordered are listed, but only abnormal results are displayed) Labs Reviewed  COMPREHENSIVE METABOLIC PANEL - Abnormal; Notable for the following components:      Result Value   BUN 22 (*)    AST 11 (*)    All other components within normal limits  CBC - Abnormal; Notable for the following components:   RBC 4.16 (*)    HCT 38.8 (*)    All other components within normal limits  URINALYSIS, ROUTINE W REFLEX MICROSCOPIC - Abnormal; Notable for the following components:   APPearance CLOUDY (*)    All other components within normal limits  VALPROIC ACID LEVEL - Abnormal; Notable for the following components:   Valproic Acid Lvl 47 (*)    All other components within normal limits  LIPASE, BLOOD  RAPID URINE DRUG SCREEN, HOSP PERFORMED  ETHANOL  MAGNESIUM  PHENYTOIN LEVEL, TOTAL    EKG EKG Interpretation  Date/Time:  Tuesday May 30 2020 23:35:42 EST Ventricular Rate:  84 PR Interval:    QRS Duration: 94 QT Interval:  357 QTC Calculation: 422 R Axis:   86 Text Interpretation: Sinus rhythm Normal ECG When compared with  ECG of 05/10/2020, No significant change was found Confirmed by Dione Booze ([id number]) on 05/30/2020 11:40:56 PM   Radiology CT Head Wo Contrast  Result Date: 05/30/2020 CLINICAL DATA:  Recent seizure activity with fall, initial encounter EXAM: CT HEAD WITHOUT CONTRAST TECHNIQUE: Contiguous axial images were obtained from the base of the skull through the vertex without intravenous contrast. COMPARISON:  05/10/2020 FINDINGS: Brain: No evidence of acute infarction, hemorrhage, hydrocephalus, extra-axial collection or mass lesion/mass effect. Vascular: No hyperdense vessel or unexpected calcification. Skull: Normal. Negative for fracture or focal lesion. Sinuses/Orbits: No acute finding. Other: None. IMPRESSION: No acute intracranial abnormality noted Electronically Signed   By: Alcide Clever M.D.   On: 05/30/2020 23:28    Procedures Procedures (including critical care time)  Medications Ordered in ED Medications  ondansetron (ZOFRAN) injection 4 mg (4 mg Intravenous Given 05/30/20 2251)  acetaminophen (TYLENOL) tablet 1,000 mg (1,000 mg Oral Given 05/31/20 0200)    ED Course  I have reviewed the triage vital signs and the nursing notes.  Pertinent labs & imaging results that were available during my care of the patient were reviewed by me and considered in my medical decision making (see chart for details).    MDM Rules/Calculators/A&P                         44 year old male presents from [facility] for evaluation of headache and nausea.  Patient states headache started after he fell from his bed while having a seizure on Sunday, has known history of seizures and has Butler seen here in the ED numerous times recently for seizure-like activity.  He states that he was moved to a separate area of the jail for monitoring and reports that he had multiple seizures yesterday, but was not transported to the hospital until today when he asked to be seen  for headaches.  He has Butler nauseated and  had one episode of vomiting.  He has no focal neurologic deficits on exam.  States he has not had seizures today and he is alert and oriented with appropriate mental status.  Will check basic labs, urinalysis, UDS, ethanol, Depakote and phenytoin levels.  Will also get head CT in the setting of posttraumatic headache.  Jail nursing staff reported that the patient was on Depakote, Dilantin, zonisamide and Keppra, but patient states that he has only Butler given the Depakote and Dilantin.  I have independently ordered, reviewed and interpreted all labs and imaging: CBC: No leukocytosis, normal hemoglobin CMP: No significant electrolyte derangements, normal renal and liver function Magnesium: WNL Urinalysis: No hematuria or signs of infection UDS: Negative Ethanol: Negative  Depakote level, mildly subtherapeutic at 47 Phenytoin level: Within therapeutic range at 12.1  Head CT: No acute intracranial abnormality.  Patient has Butler observed here in the emergency department for several hours with no seizure-like activity. Headache treated with Tylenol, given Zofran, no further vomiting. Tolerating p.o.  Case discussed with Dr. Iver Nestle with neurology, reviewed patient's medications, patient has Butler seen in the emergency department numerous times for seizure-like activity while he has Butler incarcerated, some question whether there may be a component of pseudoseizure, especially if patient has secondary gain while incarcerated, but patient does have known prior seizure history. He does not have a neurologist locally that he follows with. Jail staff has Butler managing medications. Dr. Iver Nestle does not feel that he needs to be admitted to the hospital today since he has not had any seizure activity here, but does recommend that if he has another day where he has multiple seizure episodes that he be transported to the ED at [facility] where he can undergo further evaluation, likely with EEG. Recommends increasing  evening dose of Depakote so patient will take 500 mg in the morning and 750 at night. Patient given 750 mg of Depakote here in the emergency department, he remains alert with normal neurologic exam and reassuring work-up. Will be discharged back to jail. I have written out specific instructions for new medications and monitoring to jail nursing staff as well as recommendation for return to the hospital if he has multiple seizure episodes despite new medication changes.  At this time there does not appear to be any evidence of an acute emergency medical condition and the patient appears stable for discharge with appropriate outpatient follow up. Diagnosis was discussed with patient who verbalizes understanding and is agreeable to discharge. Pt case discussed with Dr. Preston Fleeting who agrees with my plan.    Final Clinical Impression(s) / ED Diagnoses Final diagnoses:  Acute post-traumatic headache, not intractable  Nausea  Seizures (HCC)    Rx / DC Orders ED Discharge Orders         Ordered    divalproex (DEPAKOTE ER) 250 MG 24 hr tablet        05/31/20 0242           Dartha Lodge, PA-C 05/31/20 0258    Dione Booze, MD 05/31/20 [phone]

## 2020-05-31 NOTE — Discharge Instructions (Addendum)
Patient's lab work and evaluation has overall been reassuring.  His Depakote level was slightly subtherapeutic.  Our neurologist has been consulted and made medication regimen changes as noted below.  Neurologist also recommends ensuring patient is able to get a reasonable amount of sleep as this can be a trigger for more seizure episodes.  Patient should change Depakote regimen to 500 mg in the morning and 750 mg at night.  Please make sure patient is taking this in addition to his other seizure medications regularly as scheduled.  If patient has multiple seizures within a day despite these medication changes he should be sent to the emergency department at [facility] for further evaluation, will likely need EEG monitoring.  Patient will need outpatient neurology follow-up for better seizure management and medication adjustment.

## 2020-05-31 NOTE — Plan of Care (Signed)
Discussed with EDP.  Malingering vs. PNES vs. Seizures 4 AEDs apparently managed by prison system  Recommended slightly increasing Depakote given mildly low level. (500 in the morning, 750 mg at night)  Needs neurology outpatient followup and possible EMU stay for spell characterization and to confirm if AEDs needed given unclear history   Standard seizure precautions: Per [facility] statutes, patients with seizures are not allowed to drive until  they have been seizure-free for six months. Use caution when using heavy equipment or power tools. Avoid working on ladders or at heights. Take showers instead of baths. Ensure the water temperature is not too high on the home water heater. Do not go swimming alone. When caring for infants or small children, sit down when holding, feeding, or changing them to minimize risk of injury to the child in the event you have a seizure.  To reduce risk of seizures, maintain good sleep hygiene avoid alcohol and illicit drug use, take all anti-seizure medications as prescribed.   Brooke Dare MD-PhD [facility] [phone]

## 2020-06-19 ENCOUNTER — Encounter: Payer: Self-pay | Admitting: Emergency Medicine

## 2020-06-19 ENCOUNTER — Emergency Department

## 2020-06-19 ENCOUNTER — Other Ambulatory Visit: Payer: Self-pay

## 2020-06-19 ENCOUNTER — Emergency Department (HOSPITAL_COMMUNITY)
Admission: EM | Admit: 2020-06-19 | Discharge: 2020-06-19 | Disposition: A | Discharge status: Home / Self Care | Payer: Self-pay | Attending: Emergency Medicine | Admitting: Emergency Medicine

## 2020-06-19 ENCOUNTER — Encounter (HOSPITAL_COMMUNITY): Payer: Self-pay

## 2020-06-19 ENCOUNTER — Emergency Department (HOSPITAL_COMMUNITY): Payer: Self-pay

## 2020-06-19 DIAGNOSIS — R079 Chest pain, unspecified: Secondary | ICD-10-CM | POA: Insufficient documentation

## 2020-06-19 DIAGNOSIS — Z5321 Procedure and treatment not carried out due to patient leaving prior to being seen by health care provider: Secondary | ICD-10-CM | POA: Insufficient documentation

## 2020-06-19 DIAGNOSIS — R0602 Shortness of breath: Secondary | ICD-10-CM | POA: Insufficient documentation

## 2020-06-19 LAB — COMPREHENSIVE METABOLIC PANEL
ALT: 11 U/L (ref 0–44)
AST: 11 U/L — ABNORMAL LOW (ref 15–41)
Albumin: 4.5 g/dL (ref 3.5–5.0)
Alkaline Phosphatase: 75 U/L (ref 38–126)
Anion gap: 9 (ref 5–15)
BUN: 12 mg/dL (ref 6–20)
CO2: 25 mmol/L (ref 22–32)
Calcium: 9.5 mg/dL (ref 8.9–10.3)
Chloride: 104 mmol/L (ref 98–111)
Creatinine, Ser: 1.03 mg/dL (ref 0.61–1.24)
GFR, Estimated: >60 mL/min (ref >60)
Glucose, Bld: 132 mg/dL — ABNORMAL HIGH (ref 70–99)
Potassium: 3.9 mmol/L (ref 3.5–5.1)
Sodium: 138 mmol/L (ref 135–145)
Total Bilirubin: 0.6 mg/dL (ref 0.3–1.2)
Total Protein: 6.9 g/dL (ref 6.5–8.1)

## 2020-06-19 LAB — CBC WITH DIFFERENTIAL/PLATELET
Abs Immature Granulocytes: 0.02 10*3/uL (ref 0.00–0.07)
Basophils Absolute: 0.1 10*3/uL (ref 0.0–0.1)
Basophils Relative: 1 %
Eosinophils Absolute: 0.2 10*3/uL (ref 0.0–0.5)
Eosinophils Relative: 3 %
HCT: 38.1 % — ABNORMAL LOW (ref 39.0–52.0)
Hemoglobin: 13.3 g/dL (ref 13.0–17.0)
Immature Granulocytes: 0 %
Lymphocytes Relative: 38 %
Lymphs Abs: 2.5 10*3/uL (ref 0.7–4.0)
MCH: 32.7 pg (ref 26.0–34.0)
MCHC: 34.9 g/dL (ref 30.0–36.0)
MCV: 93.6 fL (ref 80.0–100.0)
Monocytes Absolute: 0.5 10*3/uL (ref 0.1–1.0)
Monocytes Relative: 8 %
Neutro Abs: 3.4 10*3/uL (ref 1.7–7.7)
Neutrophils Relative %: 50 %
Platelets: 233 10*3/uL (ref 150–400)
RBC: 4.07 MIL/uL — ABNORMAL LOW (ref 4.22–5.81)
RDW: 13.5 % (ref 11.5–15.5)
WBC: 6.6 10*3/uL (ref 4.0–10.5)
nRBC: 0 % (ref 0.0–0.2)

## 2020-06-19 LAB — TROPONIN I (HIGH SENSITIVITY): Troponin I (High Sensitivity): 3 ng/L (ref <18)

## 2020-06-19 NOTE — ED Notes (Signed)
Patient called for room placement x2 with no answer. 

## 2020-06-19 NOTE — ED Triage Notes (Signed)
Pt arrived via walk in, c/o some SOB, states he has hx of collapsed lung and wants to make sure its not the same. Spo2 100%, speaking in full sentences.

## 2020-06-19 NOTE — ED Notes (Signed)
Patient called for room placement x3 with no answer. 

## 2020-06-19 NOTE — ED Triage Notes (Signed)
Patient ambulatory to triage with steady gait, without difficulty or distress noted; pt reports since yesterday having rt sided CP radiating thru to back accomp by [facility]; st hx of collapsed lung; denies any known injury

## 2020-06-20 ENCOUNTER — Emergency Department
Admission: EM | Admit: 2020-06-20 | Discharge: 2020-06-20 | Disposition: A | Discharge status: Home / Self Care | Attending: Emergency Medicine | Admitting: Emergency Medicine

## 2020-06-20 NOTE — ED Notes (Signed)
Pt has not returned to ED lobby 

## 2020-06-20 NOTE — ED Notes (Signed)
Pt has not returned to lobby 

## 2020-06-20 NOTE — ED Notes (Signed)
Pt noted leaving ED lobby after inquiring over wait time 

## 2020-10-28 ENCOUNTER — Emergency Department (HOSPITAL_COMMUNITY): Payer: Self-pay

## 2020-10-28 ENCOUNTER — Inpatient Hospital Stay (HOSPITAL_COMMUNITY): Payer: Self-pay

## 2020-10-28 ENCOUNTER — Inpatient Hospital Stay (HOSPITAL_COMMUNITY)
Admission: EM | Admit: 2020-10-28 | Discharge: 2020-12-03 | DRG: 163 | Disposition: A | Discharge status: Home / Self Care | Payer: Self-pay | Attending: Family Medicine | Admitting: Family Medicine

## 2020-10-28 ENCOUNTER — Encounter (HOSPITAL_COMMUNITY): Payer: Self-pay

## 2020-10-28 ENCOUNTER — Other Ambulatory Visit: Payer: Self-pay

## 2020-10-28 DIAGNOSIS — R112 Nausea with vomiting, unspecified: Secondary | ICD-10-CM | POA: Diagnosis present

## 2020-10-28 DIAGNOSIS — D62 Acute posthemorrhagic anemia: Secondary | ICD-10-CM | POA: Diagnosis not present

## 2020-10-28 DIAGNOSIS — Y848 Other medical procedures as the cause of abnormal reaction of the patient, or of later complication, without mention of misadventure at the time of the procedure: Secondary | ICD-10-CM | POA: Diagnosis present

## 2020-10-28 DIAGNOSIS — F419 Anxiety disorder, unspecified: Secondary | ICD-10-CM | POA: Diagnosis present

## 2020-10-28 DIAGNOSIS — F17210 Nicotine dependence, cigarettes, uncomplicated: Secondary | ICD-10-CM | POA: Diagnosis present

## 2020-10-28 DIAGNOSIS — Z4682 Encounter for fitting and adjustment of non-vascular catheter: Secondary | ICD-10-CM

## 2020-10-28 DIAGNOSIS — U071 COVID-19: Secondary | ICD-10-CM | POA: Diagnosis present

## 2020-10-28 DIAGNOSIS — Z9689 Presence of other specified functional implants: Secondary | ICD-10-CM

## 2020-10-28 DIAGNOSIS — Z28310 Unvaccinated for covid-19: Secondary | ICD-10-CM

## 2020-10-28 DIAGNOSIS — F1490 Cocaine use, unspecified, uncomplicated: Secondary | ICD-10-CM | POA: Diagnosis present

## 2020-10-28 DIAGNOSIS — J95812 Postprocedural air leak: Secondary | ICD-10-CM | POA: Diagnosis not present

## 2020-10-28 DIAGNOSIS — J939 Pneumothorax, unspecified: Secondary | ICD-10-CM

## 2020-10-28 DIAGNOSIS — Z765 Malingerer [conscious simulation]: Secondary | ICD-10-CM

## 2020-10-28 DIAGNOSIS — Z09 Encounter for follow-up examination after completed treatment for conditions other than malignant neoplasm: Secondary | ICD-10-CM

## 2020-10-28 DIAGNOSIS — J9382 Other air leak: Secondary | ICD-10-CM

## 2020-10-28 DIAGNOSIS — F1120 Opioid dependence, uncomplicated: Secondary | ICD-10-CM | POA: Diagnosis present

## 2020-10-28 DIAGNOSIS — E538 Deficiency of other specified B group vitamins: Secondary | ICD-10-CM | POA: Diagnosis present

## 2020-10-28 DIAGNOSIS — G40909 Epilepsy, unspecified, not intractable, without status epilepticus: Secondary | ICD-10-CM | POA: Diagnosis present

## 2020-10-28 DIAGNOSIS — J984 Other disorders of lung: Secondary | ICD-10-CM | POA: Diagnosis present

## 2020-10-28 DIAGNOSIS — R9389 Abnormal findings on diagnostic imaging of other specified body structures: Secondary | ICD-10-CM

## 2020-10-28 DIAGNOSIS — J95811 Postprocedural pneumothorax: Principal | ICD-10-CM | POA: Diagnosis present

## 2020-10-28 DIAGNOSIS — G8929 Other chronic pain: Secondary | ICD-10-CM | POA: Diagnosis present

## 2020-10-28 DIAGNOSIS — E871 Hypo-osmolality and hyponatremia: Secondary | ICD-10-CM | POA: Diagnosis present

## 2020-10-28 DIAGNOSIS — I454 Nonspecific intraventricular block: Secondary | ICD-10-CM | POA: Diagnosis present

## 2020-10-28 DIAGNOSIS — J9383 Other pneumothorax: Secondary | ICD-10-CM

## 2020-10-28 DIAGNOSIS — Z9049 Acquired absence of other specified parts of digestive tract: Secondary | ICD-10-CM

## 2020-10-28 DIAGNOSIS — J432 Centrilobular emphysema: Secondary | ICD-10-CM | POA: Diagnosis present

## 2020-10-28 DIAGNOSIS — R0602 Shortness of breath: Secondary | ICD-10-CM

## 2020-10-28 DIAGNOSIS — Z79899 Other long term (current) drug therapy: Secondary | ICD-10-CM

## 2020-10-28 LAB — BASIC METABOLIC PANEL
Anion gap: 7 (ref 5–15)
BUN: 10 mg/dL (ref 6–20)
CO2: 26 mmol/L (ref 22–32)
Calcium: 8.7 mg/dL — ABNORMAL LOW (ref 8.9–10.3)
Chloride: 99 mmol/L (ref 98–111)
Creatinine, Ser: 0.97 mg/dL (ref 0.61–1.24)
GFR, Estimated: >60 mL/min (ref >60)
Glucose, Bld: 109 mg/dL — ABNORMAL HIGH (ref 70–99)
Potassium: 3.9 mmol/L (ref 3.5–5.1)
Sodium: 132 mmol/L — ABNORMAL LOW (ref 135–145)

## 2020-10-28 LAB — CBC
HCT: 35.1 % — ABNORMAL LOW (ref 39.0–52.0)
Hemoglobin: 11.8 g/dL — ABNORMAL LOW (ref 13.0–17.0)
MCH: 30.5 pg (ref 26.0–34.0)
MCHC: 33.6 g/dL (ref 30.0–36.0)
MCV: 90.7 fL (ref 80.0–100.0)
Platelets: 221 10*3/uL (ref 150–400)
RBC: 3.87 MIL/uL — ABNORMAL LOW (ref 4.22–5.81)
RDW: 11.9 % (ref 11.5–15.5)
WBC: 7.5 10*3/uL (ref 4.0–10.5)
nRBC: 0 % (ref 0.0–0.2)

## 2020-10-28 LAB — RESP PANEL BY RT-PCR (FLU A&B, COVID) ARPGX2
Influenza A by PCR: NEGATIVE
Influenza B by PCR: NEGATIVE
SARS Coronavirus 2 by RT PCR: POSITIVE — AB

## 2020-10-28 LAB — RETICULOCYTES
Immature Retic Fract: 10.8 % (ref 2.3–15.9)
RBC.: 3.84 MIL/uL — ABNORMAL LOW (ref 4.22–5.81)
Retic Count, Absolute: 34.6 10*3/uL (ref 19.0–186.0)
Retic Ct Pct: 0.9 % (ref 0.4–3.1)

## 2020-10-28 LAB — IRON AND TIBC
Iron: 16 ug/dL — ABNORMAL LOW (ref 45–182)
Saturation Ratios: 5 % — ABNORMAL LOW (ref 17.9–39.5)
TIBC: 318 ug/dL (ref 250–450)
UIBC: 302 ug/dL

## 2020-10-28 LAB — SEDIMENTATION RATE: Sed Rate: 40 mm/hr — ABNORMAL HIGH (ref 0–16)

## 2020-10-28 LAB — FOLATE: Folate: 7.5 ng/mL (ref >5.9)

## 2020-10-28 LAB — C-REACTIVE PROTEIN: CRP: 2.3 mg/dL — ABNORMAL HIGH (ref <1.0)

## 2020-10-28 LAB — FERRITIN: Ferritin: 117 ng/mL (ref 24–336)

## 2020-10-28 LAB — VITAMIN B12: Vitamin B-12: 241 pg/mL (ref 180–914)

## 2020-10-28 MED ORDER — LIDOCAINE HCL (PF) 2 % IJ SOLN: 20.0000 mL | Freq: Once | INTRAMUSCULAR | Status: AC

## 2020-10-28 MED ORDER — ENOXAPARIN SODIUM 40 MG/0.4ML IJ SOSY
40.0000 mg | PREFILLED_SYRINGE | INTRAMUSCULAR | Status: DC
  Administered 2020-10-28 – 2020-10-29 (×2): 40 mg via SUBCUTANEOUS
  Filled 2020-10-28 (×2): qty 0.4

## 2020-10-28 MED ORDER — LIDOCAINE HCL (PF) 2 % IJ SOLN
INTRAMUSCULAR | Status: AC
  Administered 2020-10-28: 20 mL
  Filled 2020-10-28: qty 40

## 2020-10-28 MED ORDER — ACETAMINOPHEN 325 MG PO TABS: 650.0000 mg | ORAL_TABLET | Freq: Four times a day (QID) | ORAL | Status: DC | PRN

## 2020-10-28 MED ORDER — PROPOFOL 10 MG/ML IV BOLUS: 1.0000 mg/kg | Freq: Once | INTRAVENOUS | Status: AC

## 2020-10-28 MED ORDER — SODIUM CHLORIDE 0.9% FLUSH
3.0000 mL | Freq: Two times a day (BID) | INTRAVENOUS | Status: DC
  Administered 2020-10-28 – 2020-11-04 (×16): 3 mL via INTRAVENOUS

## 2020-10-28 MED ORDER — DIVALPROEX SODIUM 250 MG PO DR TAB: 250.0000 mg | DELAYED_RELEASE_TABLET | Freq: Every day | ORAL | Status: DC

## 2020-10-28 MED ORDER — ONDANSETRON HCL 4 MG PO TABS
4.0000 mg | ORAL_TABLET | Freq: Four times a day (QID) | ORAL | Status: DC | PRN
  Administered 2020-11-02: 4 mg via ORAL
  Filled 2020-10-28: qty 1

## 2020-10-28 MED ORDER — PROPOFOL 10 MG/ML IV BOLUS
INTRAVENOUS | Status: AC
  Administered 2020-10-28: 200 mg via INTRAVENOUS
  Filled 2020-10-28: qty 20

## 2020-10-28 MED ORDER — HYDROCODONE-ACETAMINOPHEN 5-325 MG PO TABS
1.0000 | ORAL_TABLET | ORAL | Status: DC | PRN
  Administered 2020-10-28 – 2020-10-29 (×4): 2 via ORAL
  Administered 2020-10-30: 1 via ORAL
  Administered 2020-10-31: 2 via ORAL
  Filled 2020-10-28 (×2): qty 2
  Filled 2020-10-28: qty 1
  Filled 2020-10-28 (×5): qty 2

## 2020-10-28 MED ORDER — LACOSAMIDE 50 MG PO TABS
100.0000 mg | ORAL_TABLET | Freq: Two times a day (BID) | ORAL | Status: DC
  Administered 2020-10-28 – 2020-12-03 (×73): 100 mg via ORAL
  Filled 2020-10-28 (×74): qty 2

## 2020-10-28 MED ORDER — DIVALPROEX SODIUM 250 MG PO DR TAB
500.0000 mg | DELAYED_RELEASE_TABLET | Freq: Every day | ORAL | Status: DC
  Administered 2020-10-28 – 2020-12-03 (×37): 500 mg via ORAL
  Filled 2020-10-28 (×37): qty 2

## 2020-10-28 MED ORDER — ONDANSETRON HCL 4 MG/2ML IJ SOLN
4.0000 mg | Freq: Four times a day (QID) | INTRAMUSCULAR | Status: DC | PRN
  Administered 2020-10-29 – 2020-11-29 (×10): 4 mg via INTRAVENOUS
  Filled 2020-10-28 (×12): qty 2

## 2020-10-28 MED ORDER — ACETAMINOPHEN 650 MG RE SUPP: 650.0000 mg | Freq: Four times a day (QID) | RECTAL | Status: DC | PRN

## 2020-10-28 MED ORDER — DIVALPROEX SODIUM 250 MG PO DR TAB
750.0000 mg | DELAYED_RELEASE_TABLET | Freq: Every day | ORAL | Status: DC
  Administered 2020-10-28 – 2020-12-02 (×36): 750 mg via ORAL
  Filled 2020-10-28 (×36): qty 3

## 2020-10-28 MED ORDER — FENTANYL CITRATE (PF) 100 MCG/2ML IJ SOLN
200.0000 ug | Freq: Once | INTRAMUSCULAR | Status: AC
  Administered 2020-10-28: 200 ug via INTRAVENOUS
  Filled 2020-10-28: qty 4

## 2020-10-28 MED ORDER — PROPOFOL 10 MG/ML IV BOLUS
0.5000 mg/kg | Freq: Once | INTRAVENOUS | Status: DC
Start: 2020-10-28 — End: 2020-10-28

## 2020-10-28 MED ORDER — HYDROMORPHONE HCL 1 MG/ML IJ SOLN
0.5000 mg | INTRAMUSCULAR | Status: DC | PRN
  Administered 2020-10-28 – 2020-11-01 (×27): 1 mg via INTRAVENOUS
  Filled 2020-10-28 (×27): qty 1

## 2020-10-28 MED ORDER — SODIUM CHLORIDE 0.9% FLUSH: 3.0000 mL | INTRAVENOUS | Status: DC | PRN

## 2020-10-28 MED ORDER — ZONISAMIDE 100 MG PO CAPS
300.0000 mg | ORAL_CAPSULE | Freq: Every day | ORAL | Status: DC
  Administered 2020-10-28 – 2020-12-02 (×36): 300 mg via ORAL
  Filled 2020-10-28 (×39): qty 3

## 2020-10-28 MED ORDER — SODIUM CHLORIDE 0.9 % IV SOLN: 250.0000 mL | INTRAVENOUS | Status: DC | PRN

## 2020-10-28 NOTE — ED Provider Notes (Addendum)
[facility]GENCY [facility] Provider Note   CSN: [id number] Arrival date & time: 10/28/20  0154     History Chief Complaint  Patient presents with  . Shortness of Breath    States that it hurts to breathe et that he has a collapsed upper right lung    Rodgers Gailen Shelterllen Hashman is a 45 y.o. male.  Patient presents to the emergency department for evaluation of chest pain.  Patient reports a history of pneumothorax and thinks that he may have collapsed his lung again.  Patient is brought to the ER in custody of police.  He tried to run from the police tonight, when apprehended and placed under arrest he started to state that he had chest pain and needed to go to the ER.        Past Medical History:  Diagnosis Date  . Pancreatitis   . Pneumothorax   . Seizures [facility](HCC)     Patient Active Problem List   Diagnosis Date Noted  . Witnessed seizure-like activity (HCC) 04/26/2020  . 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 APPENDECTOMY    . CHOLECYSTECTOMY         Family History  Family history unknown: Yes    Social History   Tobacco Use  . Smoking status: Current Every Day Smoker    Packs/day: 2.00    Types: Cigarettes  . Smokeless tobacco: Never Used  Vaping Use  . Vaping Use: Never used  Substance Use Topics  . Alcohol use: Not Currently    Comment: occassional  . Drug use: Not Currently    Types: Marijuana    Home Medications Prior to Admission medications   Medication Sig Start Date End Date Taking? Authorizing Provider  acetaminophen (TYLENOL) 325 MG tablet Take 2 tablets (650 mg total) by mouth every 6 (six) hours as needed for mild pain (or Fever >/= 101). 04/27/20   Dorcas CarrowGhimire, Kuber, MD  divalproex (DEPAKOTE ER) 250 MG 24 hr tablet Take 500 mg (2 tablets) in the morning, and 750 mg (3 tablets) in the evening 05/31/20   Dartha LodgeFord, Kelsey N, PA-C  Lacosamide 100 MG TABS Take 1 tablet (100 mg total) by  mouth 2 (two) times daily. 05/06/20   Benjiman CorePickering, Nathan, MD  zonisamide (ZONEGRAN) 100 MG capsule Take 3 capsules (300 mg total) by mouth at bedtime. 04/30/20   Garlon HatchetSanders, Lisa M, PA-C    Allergies    Patient has no known allergies.  Review of Systems   Review of Systems  Respiratory: Positive for shortness of breath.   Cardiovascular: Positive for chest pain.  All other systems reviewed and are negative.   Physical Exam Updated Vital Signs BP 98/69   Pulse 73   Temp 99.8 F (37.7 C) (Oral)   Resp 15   SpO2 98%   Physical Exam Vitals and nursing note reviewed.  Constitutional:      General: He is not in acute distress.    Appearance: Normal appearance. He is well-developed.  HENT:     Head: Normocephalic and atraumatic.     Right Ear: Hearing normal.     Left Ear: Hearing normal.     Nose: Nose normal.  Eyes:     Conjunctiva/sclera: Conjunctivae normal.     Pupils: Pupils are equal, round, and reactive to light.  Cardiovascular:     Rate and Rhythm: Regular rhythm. Tachycardia present.     Heart sounds: S1 normal and S2 normal.  No murmur heard. No friction rub. No gallop.   Pulmonary:     Effort: Pulmonary effort is normal. Tachypnea present. No respiratory distress.     Breath sounds: Normal breath sounds.  Chest:     Chest wall: No tenderness.  Abdominal:     General: Bowel sounds are normal.     Palpations: Abdomen is soft.     Tenderness: There is no abdominal tenderness. There is no guarding or rebound. Negative signs include Murphy's sign and McBurney's sign.     Hernia: No hernia is present.  Musculoskeletal:        General: Normal range of motion.     Cervical back: Normal range of motion and neck supple.  Skin:    General: Skin is warm and dry.     Findings: No rash.  Neurological:     Mental Status: He is alert and oriented to person, place, and time.     GCS: GCS eye subscore is 4. GCS verbal subscore is 5. GCS motor subscore is 6.     Cranial  Nerves: No cranial nerve deficit.     Sensory: No sensory deficit.     Coordination: Coordination normal.  Psychiatric:        Mood and Affect: Mood is anxious.        Speech: Speech normal.        Behavior: Behavior normal.        Thought Content: Thought content normal.     ED Results / Procedures / Treatments   Labs (all labs ordered are listed, but only abnormal results are displayed) Labs Reviewed  RESP PANEL BY RT-PCR (FLU A&B, COVID) ARPGX2 - Abnormal; Notable for the following components:      Result Value   SARS Coronavirus 2 by RT PCR POSITIVE (*)    All other components within normal limits  CBC - Abnormal; Notable for the following components:   RBC 3.87 (*)    Hemoglobin 11.8 (*)    HCT 35.1 (*)    All other components within normal limits  BASIC METABOLIC PANEL - Abnormal; Notable for the following components:   Sodium 132 (*)    Glucose, Bld 109 (*)    Calcium 8.7 (*)    All other components within normal limits    EKG EKG Interpretation  Date/Time:  Saturday October 28 2020 02:17:25 EDT Ventricular Rate:  98 PR Interval:  124 QRS Duration: 100 QT Interval:  352 QTC Calculation: 450 R Axis:   88 Text Interpretation: Sinus rhythm ST elev, probable normal early repol pattern Artifact in lead(s) I III aVR aVL aVF V1 V2 V5 V6 Confirmed by Gilda Crease ([id number]) on 10/28/2020 2:44:03 AM   Radiology DG Chest Port 1 View  Result Date: 10/28/2020 CLINICAL DATA:  Shortness of breath with history of spontaneous pneumothorax. EXAM: PORTABLE CHEST 1 VIEW COMPARISON:  June 19, 2020 FINDINGS: A small to moderate size pneumothorax is seen along the lateral aspect of the mid and lower right hemithorax. Mild atelectasis is seen within the medial aspect of the right lung base. There is no evidence of a pleural effusion. The heart size and mediastinal contours are within normal limits. Radiopaque surgical clips are seen overlying the right upper quadrant. The  visualized skeletal structures are unremarkable. IMPRESSION: Small to moderate sized right-sided pneumothorax. Electronically Signed   By: Aram Candela M.D.   On: 10/28/2020 02:49    Procedures .Sedation  Date/Time: 10/28/2020 4:56 AM Performed by: Jaci Carrel  J, MD Authorized by: Gilda Crease, MD   Consent:    Consent obtained:  Written   Consent given by:  Patient   Risks discussed:  Prolonged hypoxia resulting in organ damage, allergic reaction, dysrhythmia, prolonged sedation necessitating reversal, inadequate sedation and respiratory compromise necessitating ventilatory assistance and intubation Universal protocol:    Procedure explained and questions answered to patient or proxy's satisfaction: yes     Relevant documents present and verified: yes     Test results available: yes     Imaging studies available: yes     Required blood products, implants, devices, and special equipment available: yes     Site/side marked: yes     Immediately prior to procedure, a time out was called: yes     Patient identity confirmed:  Arm band Indications:    Procedure performed:  Chest tube placement   Procedure necessitating sedation performed by:  Physician performing sedation Pre-sedation assessment:    Time since last food or drink:  6   ASA classification: class 1 - normal, healthy patient     Mouth opening:  3 or more finger widths   Mallampati score:  I - soft palate, uvula, fauces, pillars visible   Neck mobility: normal     Pre-sedation assessments completed and reviewed: airway patency, cardiovascular function, hydration status, mental status, nausea/vomiting, pain level, respiratory function and temperature     Pre-sedation assessment completed:  10/28/2020 4:20 AM Procedure details (see MAR for exact dosages):    Preoxygenation:  Room air   Sedation:  Propofol   Intended level of sedation: deep   Analgesia:  Fentanyl   Intra-procedure monitoring:  Blood  pressure monitoring, cardiac monitor, continuous pulse oximetry, frequent LOC assessments and frequent vital sign checks   Intra-procedure events: none     Total Provider sedation time (minutes):  15 Post-procedure details:    Post-sedation assessment completed:  10/28/2020 4:58 AM   Attendance: Constant attendance by certified staff until patient recovered     Recovery: Patient returned to pre-procedure baseline     Post-sedation assessments completed and reviewed: airway patency, cardiovascular function, hydration status, mental status, nausea/vomiting, pain level, respiratory function and temperature     Patient is stable for discharge or admission: yes     Procedure completion:  Tolerated well, no immediate complications CHEST TUBE INSERTION  Date/Time: 10/28/2020 4:58 AM Performed by: Gilda Crease, MD Authorized by: Gilda Crease, MD   Consent:    Consent obtained:  Written   Consent given by:  Patient   Risks, benefits, and alternatives were discussed: not applicable     Risks discussed:  Bleeding, incomplete drainage, nerve damage, pain, infection and damage to surrounding structures Pre-procedure details:    Skin preparation:  Chlorhexidine   Preparation: Patient was prepped and draped in the usual sterile fashion   Sedation:    Sedation type:  Deep Anesthesia:    Anesthesia method:  Local infiltration   Local anesthetic:  Lidocaine 2% w/o epi Procedure details:    Placement location:  R lateral   Scalpel size:  11   Tube size ([place]): 14.   Tension pneumothorax: no     Tube connected to:  Suction   Drainage characteristics:  Air only   Suture material:  0 silk   Dressing:  Petrolatum-impregnated gauze Post-procedure details:    Post-insertion x-Kennth findings: tube repositioned     Post-insertion x-Donovin findings comment:  Pulled back 3cm   Procedure completion:  Tolerated well,  no immediate complications     Medications Ordered in ED Medications   propofol (DIPRIVAN) 10 mg/mL bolus/IV push 1 mg/kg (has no administration in time range)  propofol (DIPRIVAN) 10 mg/mL bolus/IV push (has no administration in time range)  lidocaine HCl (PF) (XYLOCAINE) 2 % injection 20 mL (20 mLs Infiltration Given 10/28/20 0414)  fentaNYL (SUBLIMAZE) injection 200 mcg (200 mcg Intravenous Given 10/28/20 0413)    ED Course  I have reviewed the triage vital signs and the nursing notes.  Pertinent labs & imaging results that were available during my care of the patient were reviewed by me and considered in my medical decision making (see chart for details).    MDM Rules/Calculators/A&P                          Patient presents to the emergency department for evaluation of sudden onset right-sided chest pain and shortness of breath.  Patient reports previous history of collapsed lung and this feels similar.  Chest x-Fordyce does show approximately 25% pneumothorax.  Reviewing his records reveals that this is at least the third time that he has had a pneumothorax.  His last pneumothorax was in 2018 and that time he was supposed to have a VATS procedure but left AGAINST MEDICAL ADVICE.  Based on this, I did discuss the patient's care plan with Dr. Cliffton Asters, on-call for cardiothoracic surgery.  He did recommend chest tube placement by myself here in the department but then transferred to [name] on the hospitalist service so patient can be followed by cardiothoracic surgery.  Hospitalist is to call cardiothoracic surgery when patient arrives at [facility].  Addendum: Incidental finding, COVID-positive  Final Clinical Impression(s) / ED Diagnoses Final diagnoses:  Recurrent spontaneous pneumothorax    Rx / DC Orders ED Discharge Orders    None       Yilin Weedon, Canary Brim, MD 10/28/20 0501    Gilda Crease, MD 10/28/20 [phone]

## 2020-10-28 NOTE — ED Notes (Signed)
Pt resting PD at bedside (outside room).

## 2020-10-28 NOTE — ED Notes (Signed)
Date and time results received: 10/28/20 @ 0453 (use smartphrase ".now" to insert current time)  Test: Covid Critical Value: Positive Name of Provider Notified: Dr. Blinda Leatherwood Orders Received? None Or Actions Taken?: None

## 2020-10-28 NOTE — ED Triage Notes (Signed)
Pt brought in via ems accompanied by [employer] for medical clearance. Pt stating that he has a right upper collapsed lung.

## 2020-10-28 NOTE — H&P (Signed)
History and Physical    Ronald Butler XFG:[id number] DOB: 1975-09-10 DOA: 10/28/2020  PCP: Patient, No Pcp Per (Inactive)   Patient coming from: Police custody  Chief Complaint: Chest pain  HPI: Ronald Butler is a 45 y.o. male with medical history significant for seizure disorder, tobacco abuse, polysubstance abuse, and recurrent pneumothorax who was apparently running from the police last night.  After he was apprehended and placed under arrest he began to have sudden onset of chest pain that was sharp and more right-sided.  He was also noted to have some mild dyspnea associated with this and stated that he needed to come to the ED.  He denies any recent cough, fevers, chills, or symptoms of rhinitis.  He denies any sick contacts.  He states that he has not been vaccinated for COVID.  He admits to cocaine use and states that he smokes "as much as I can."  He denies any current alcohol use.   ED Course: Vital signs stable patient is afebrile.  Incidentally he is noted to be COVID-positive.  Hemoglobin noted to be 11.8, sodium 132.  His chest x-Keiran demonstrates a small to moderate pneumo thorax to the right chest cavity for which a chest tube has not been placed.  He remains on room air with no significant dyspnea noted, but is complaining of pain.  EDP has discussed with CT surgery regarding need for transfer and CT surgery agrees that he will need transfer for possible VATS procedure given the recurrence of his pneumothorax.  Review of Systems: Reviewed as noted above, otherwise negative.  Past Medical History:  Diagnosis Date  . Pancreatitis   . Pneumothorax   . Seizures (HCC)     Past Surgical History:  Procedure Laterality Date  . APPENDECTOMY    . CHOLECYSTECTOMY       reports that he has been smoking cigarettes. He has been smoking about 2.00 packs per day. He has never used smokeless tobacco. He reports previous alcohol use. He reports previous drug use. Drug:  Marijuana.  No Known Allergies  Family History  Family history unknown: Yes    Prior to Admission medications   Medication Sig Start Date End Date Taking? Authorizing Provider  acetaminophen (TYLENOL) 325 MG tablet Take 2 tablets (650 mg total) by mouth every 6 (six) hours as needed for mild pain (or Fever >/= 101). 04/27/20   Dorcas Carrow, MD  divalproex (DEPAKOTE ER) 250 MG 24 hr tablet Take 500 mg (2 tablets) in the morning, and 750 mg (3 tablets) in the evening 05/31/20   Dartha Lodge, PA-C  Lacosamide 100 MG TABS Take 1 tablet (100 mg total) by mouth 2 (two) times daily. 05/06/20   Benjiman Core, MD  zonisamide (ZONEGRAN) 100 MG capsule Take 3 capsules (300 mg total) by mouth at bedtime. 04/30/20   Garlon Hatchet, PA-C    Physical Exam: Vitals:   10/28/20 0511 10/28/20 0515 10/28/20 0550 10/28/20 0630  BP:  104/68 104/77 103/70  Pulse:  70 70   Resp:  (!) 25 19 16   Temp:      TempSrc:      SpO2:  95% 95%   Weight: 67.4 kg       Constitutional: NAD, calm, comfortable Vitals:   10/28/20 0511 10/28/20 0515 10/28/20 0550 10/28/20 0630  BP:  104/68 104/77 103/70  Pulse:  70 70   Resp:  (!) 25 19 16   Temp:      TempSrc:  SpO2:  95% 95%   Weight: 67.4 kg      Eyes: lids and conjunctivae normal Neck: normal, supple Respiratory: clear to auscultation bilaterally. Normal respiratory effort. No accessory muscle use.  Right-sided chest tube present. Cardiovascular: Regular rate and rhythm, no murmurs. Abdomen: no tenderness, no distention. Bowel sounds positive.  Musculoskeletal:  No edema. Skin: no rashes, lesions, ulcers.  Psychiatric: Flat affect  Labs on Admission: I have personally reviewed following labs and imaging studies  CBC: Recent Labs  Lab 10/28/20 0329  WBC 7.5  HGB 11.8*  HCT 35.1*  MCV 90.7  PLT 221   Basic Metabolic Panel: Recent Labs  Lab 10/28/20 0329  NA 132*  K 3.9  CL 99  CO2 26  GLUCOSE 109*  BUN 10  CREATININE 0.97   CALCIUM 8.7*   GFR: Estimated Creatinine Clearance: 93.8 mL/min (by C-G formula based on SCr of 0.97 mg/dL). Liver Function Tests: No results for input(s): AST, ALT, ALKPHOS, BILITOT, PROT, ALBUMIN in the last 168 hours. No results for input(s): LIPASE, AMYLASE in the last 168 hours. No results for input(s): AMMONIA in the last 168 hours. Coagulation Profile: No results for input(s): INR, PROTIME in the last 168 hours. Cardiac Enzymes: No results for input(s): CKTOTAL, CKMB, CKMBINDEX, TROPONINI in the last 168 hours. BNP (last 3 results) No results for input(s): PROBNP in the last 8760 hours. HbA1C: No results for input(s): HGBA1C in the last 72 hours. CBG: No results for input(s): GLUCAP in the last 168 hours. Lipid Profile: No results for input(s): CHOL, HDL, LDLCALC, TRIG, CHOLHDL, LDLDIRECT in the last 72 hours. Thyroid Function Tests: No results for input(s): TSH, T4TOTAL, FREET4, T3FREE, THYROIDAB in the last 72 hours. Anemia Panel: No results for input(s): VITAMINB12, FOLATE, FERRITIN, TIBC, IRON, RETICCTPCT in the last 72 hours. Urine analysis:    Component Value Date/Time   COLORURINE YELLOW 05/31/2020 0051   APPEARANCEUR CLOUDY (A) 05/31/2020 0051   LABSPEC 1.021 05/31/2020 0051   PHURINE 6.0 05/31/2020 0051   GLUCOSEU NEGATIVE 05/31/2020 0051   HGBUR NEGATIVE 05/31/2020 0051   BILIRUBINUR NEGATIVE 05/31/2020 0051   KETONESUR NEGATIVE 05/31/2020 0051   PROTEINUR NEGATIVE 05/31/2020 0051   NITRITE NEGATIVE 05/31/2020 0051   LEUKOCYTESUR NEGATIVE 05/31/2020 0051    Radiological Exams on Admission: DG Chest Port 1 View  Result Date: 10/28/2020 CLINICAL DATA:  Shortness of breath with history of spontaneous pneumothorax. EXAM: PORTABLE CHEST 1 VIEW COMPARISON:  June 19, 2020 FINDINGS: A small to moderate size pneumothorax is seen along the lateral aspect of the mid and lower right hemithorax. Mild atelectasis is seen within the medial aspect of the right lung  base. There is no evidence of a pleural effusion. The heart size and mediastinal contours are within normal limits. Radiopaque surgical clips are seen overlying the right upper quadrant. The visualized skeletal structures are unremarkable. IMPRESSION: Small to moderate sized right-sided pneumothorax. Electronically Signed   By: Aram Candela M.D.   On: 10/28/2020 02:49   DG Chest Port 1V same Day  Result Date: 10/28/2020 CLINICAL DATA:  Chest tube insertion, COVID-19 positive EXAM: PORTABLE CHEST 1 VIEW COMPARISON:  Radiograph 10/28/2020 FINDINGS: Patient is post placement of a right chest tube which terminates in the mid right lung towards midline. Small residual lateral right pneumothorax and some questionable trace right effusion. No left pneumothorax or pleural fluid. Some additional coarsened interstitial opacities are present which could reflect atelectasis or atypical infection in the setting of COVID 19. Stable cardiomediastinal contours.  Telemetry leads overlie the chest. IMPRESSION: Interval placement of a right chest tube with decrease in size of the previously seen right pneumothorax Small residual right pneumothorax and trace right pleural fluid remains. Some coarsened interstitial opacities, possibly atelectasis versus atypical infection in the setting of COVID-19. Electronically Signed   By: Kreg Shropshire M.D.   On: 10/28/2020 06:50    EKG: Independently reviewed.  NSR 98 bpm with artifact.  Assessment/Plan Active Problems:   Pneumothorax    Chest pain secondary to right-sided pneumothorax-recurrent -Appears to be the third time this has occurred -Status post chest tube insertion in ED -EDP has discussed with cardiothoracic surgery Dr. Cliffton Asters who would like patient transferred under hospitalist service so that he may have VATS procedure -Pain management -Currently on room air with no significant dyspnea noted  Incidental COVID-positive -Maintain on isolation  precautions -Previously unvaccinated -No significant hypoxemia or symptomatology associated with this -Will not initiate remdesivir or dexamethasone at this time -Follow inflammatory markers and consider steroid initiation if upward trend  Mild anemia -No overt bleeding identified -Check anemia panel -Monitor repeat CBC  Mild hyponatremia -Appears euvolemic -Continue to monitor for now -May consider further testing if this remains abnormal  History of seizure disorder -Continue home medications  History of tobacco abuse -Counseled on cessation -Nicotine patch refused  Questionable polysubstance abuse -Prior UDS negative -Admits to cocaine use -Plan to check UDS again   DVT prophylaxis: Lovenox Code Status: Full Family Communication: None at bedside; pt didn't want me to call Disposition Plan:Transfer to [facility] for CTS evaluation for VATS Consults called:Cardiothoracic Surgery Dr. Cliffton Asters by EDP Admission status: Inpatient, Tele   Natayah Warmack D Timoty Bourke DO [facility]  If 7PM-7AM, please contact night-coverage [email or web address]  10/28/2020, 7:18 AM

## 2020-10-28 NOTE — Consult Note (Signed)
[address]       [place] [address]             [phone]                    Thaxton Ermon [facility] Medical Record #[id number] Date of Birth: 1975/08/06  Referring: No ref. provider found Primary Care: Patient, No Pcp Per (Inactive) Primary Cardiologist: No primary care provider on file.  Chief Complaint:    Chief Complaint  Patient presents with  . Shortness of Breath    States that it hurts to breathe et that he has a collapsed upper right lung    History of Present Illness:    Ronald Butler 45 y.o. male transferred to the hospital with a spontaneous pneumothorax.  Resolved.  On placement tentatively but developed acute onset pleuritic chest pain.  He was subsequently taken to the emergency department where he was noted to have a spontaneous right-sided pneumothorax.  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  He was originally offered surgery but left AGAINST MEDICAL ADVICE the last time.  This was in 2018.     Past Medical History:  Diagnosis Date  . Pancreatitis   . Pneumothorax   . Seizures (HCC)     Past Surgical History:  Procedure Laterality Date  . APPENDECTOMY    . CHOLECYSTECTOMY      Family History  Family history unknown: Yes     Social History   Tobacco Use  Smoking Status Current Every Day Smoker  . Packs/day: 2.00  . Types: Cigarettes  Smokeless Tobacco Never Used    Social History   Substance and Sexual Activity  Alcohol Use Not Currently   Comment: occassional     No Known Allergies  Current Facility-Administered Medications  Medication Dose Route Frequency Provider Last Rate Last Admin  . 0.9 %  sodium chloride infusion  250 mL Intravenous PRN Sherryll Burger, Pratik D, DO      . acetaminophen (TYLENOL) tablet 650 mg  650 mg Oral Q6H PRN Sherryll Burger, Pratik D, DO       Or  . acetaminophen (TYLENOL) suppository 650 mg  650 mg Rectal Q6H PRN Sherryll Burger, Pratik D, DO      . divalproex (DEPAKOTE)  DR tablet 500 mg  500 mg Oral Daily Zierle-Ghosh, Asia B, DO      . divalproex (DEPAKOTE) DR tablet 750 mg  750 mg Oral QHS Zierle-Ghosh, Asia B, DO      . enoxaparin (LOVENOX) injection 40 mg  40 mg Subcutaneous Q24H Shah, Pratik D, DO   40 mg at 10/28/20 1013  . HYDROcodone-acetaminophen (NORCO/VICODIN) 5-325 MG per tablet 1-2 tablet  1-2 tablet Oral Q4H PRN Sherryll Burger, Pratik D, DO      . HYDROmorphone (DILAUDID) injection 0.5-1 mg  0.5-1 mg Intravenous Q2H PRN Sherryll Burger, Pratik D, DO   1 mg at 10/28/20 1013  . lacosamide (VIMPAT) tablet 100 mg  100 mg Oral BID Sherryll Burger, Pratik D, DO      . ondansetron (ZOFRAN) tablet 4 mg  4 mg Oral Q6H PRN Sherryll Burger, Pratik D, DO       Or  . ondansetron (ZOFRAN) injection 4 mg  4 mg Intravenous Q6H PRN Sherryll Burger, Pratik D, DO      . sodium chloride flush (NS) 0.9 % injection 3 mL  3 mL Intravenous Q12H Shah, Pratik D, DO      . sodium chloride  flush (NS) 0.9 % injection 3 mL  3 mL Intravenous PRN Sherryll Burger, Pratik D, DO      . zonisamide (ZONEGRAN) capsule 300 mg  300 mg Oral QHS Shah, Pratik D, DO        Review of Systems  Respiratory: Positive for shortness of breath.   Cardiovascular: Positive for chest pain.    PHYSICAL EXAMINATION: BP 118/72 (BP Location: Left Arm)   Pulse 62   Temp 98.2 F (36.8 C) (Oral)   Resp 15   Wt 67.4 kg   SpO2 98%   BMI 20.74 kg/m   Physical Exam Constitutional:      General: He is not in acute distress.    Appearance: He is not ill-appearing or toxic-appearing.  HENT:     Head: Normocephalic and atraumatic.  Cardiovascular:     Rate and Rhythm: Normal rate and regular rhythm.  Pulmonary:     Effort: Pulmonary effort is normal.     Comments: No air leak noted on Pleur-evac. Skin:    General: Skin is warm.  Neurological:     General: No focal deficit present.     Mental Status: He is alert and oriented to person, place, and time.      Diagnostic Studies & Laboratory data:     Recent Radiology Findings:   DG Chest Port 1  View  Result Date: 10/28/2020 CLINICAL DATA:  Shortness of breath with history of spontaneous pneumothorax. EXAM: PORTABLE CHEST 1 VIEW COMPARISON:  June 19, 2020 FINDINGS: A small to moderate size pneumothorax is seen along the lateral aspect of the mid and lower right hemithorax. Mild atelectasis is seen within the medial aspect of the right lung base. There is no evidence of a pleural effusion. The heart size and mediastinal contours are within normal limits. Radiopaque surgical clips are seen overlying the right upper quadrant. The visualized skeletal structures are unremarkable. IMPRESSION: Small to moderate sized right-sided pneumothorax. Electronically Signed   By: Aram Candela M.D.   On: 10/28/2020 02:49   DG Chest Port 1V same Day  Result Date: 10/28/2020 CLINICAL DATA:  Chest tube insertion, COVID-19 positive EXAM: PORTABLE CHEST 1 VIEW COMPARISON:  Radiograph 10/28/2020 FINDINGS: Patient is post placement of a right chest tube which terminates in the mid right lung towards midline. Small residual lateral right pneumothorax and some questionable trace right effusion. No left pneumothorax or pleural fluid. Some additional coarsened interstitial opacities are present which could reflect atelectasis or atypical infection in the setting of COVID 19. Stable cardiomediastinal contours. Telemetry leads overlie the chest. IMPRESSION: Interval placement of a right chest tube with decrease in size of the previously seen right pneumothorax Small residual right pneumothorax and trace right pleural fluid remains. Some coarsened interstitial opacities, possibly atelectasis versus atypical infection in the setting of COVID-19. Electronically Signed   By: Kreg Shropshire M.D.   On: 10/28/2020 06:50       I have independently reviewed the above radiology studies  and reviewed the findings with the patient.   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  We will obtain a CT of the chest to evaluate for any bullous disease.  He likely will need to be over his COVID infection prior to considering surgery.  Continue chest tube for now.      Corliss Skains 10/28/2020 12:05 PM

## 2020-10-28 NOTE — Progress Notes (Signed)
Patient admitted to [facility] from [facility] Patient is alert and oriented x4. Vital signs are stable and he is on room air. Has no complaints of pain. Skin is intact, no signs of skin breakdown noted on exam. Patient belongings at bedside (clothing, shoes, cell phone). The patient was shown how to use the call bell. Call bell, phone and bedside table are within reach; bed is in the lowest position.

## 2020-10-28 NOTE — ED Notes (Signed)
Pt threw his urinal at the door.

## 2020-10-29 LAB — CBC
HCT: 39 % (ref 39.0–52.0)
Hemoglobin: 13.4 g/dL (ref 13.0–17.0)
MCH: 30.7 pg (ref 26.0–34.0)
MCHC: 34.4 g/dL (ref 30.0–36.0)
MCV: 89.2 fL (ref 80.0–100.0)
Platelets: 252 10*3/uL (ref 150–400)
RBC: 4.37 MIL/uL (ref 4.22–5.81)
RDW: 11.8 % (ref 11.5–15.5)
WBC: 5.1 10*3/uL (ref 4.0–10.5)
nRBC: 0 % (ref 0.0–0.2)

## 2020-10-29 LAB — RAPID URINE DRUG SCREEN, HOSP PERFORMED
Amphetamines: NOT DETECTED
Barbiturates: NOT DETECTED
Benzodiazepines: NOT DETECTED
Cocaine: POSITIVE — AB
Opiates: POSITIVE — AB
Tetrahydrocannabinol: NOT DETECTED

## 2020-10-29 LAB — BASIC METABOLIC PANEL
Anion gap: 10 (ref 5–15)
BUN: 13 mg/dL (ref 6–20)
CO2: 26 mmol/L (ref 22–32)
Calcium: 9.2 mg/dL (ref 8.9–10.3)
Chloride: 101 mmol/L (ref 98–111)
Creatinine, Ser: 0.89 mg/dL (ref 0.61–1.24)
GFR, Estimated: >60 mL/min (ref >60)
Glucose, Bld: 107 mg/dL — ABNORMAL HIGH (ref 70–99)
Potassium: 3.7 mmol/L (ref 3.5–5.1)
Sodium: 137 mmol/L (ref 135–145)

## 2020-10-29 LAB — MAGNESIUM: Magnesium: 2 mg/dL (ref 1.7–2.4)

## 2020-10-29 LAB — C-REACTIVE PROTEIN: CRP: 3 mg/dL — ABNORMAL HIGH (ref <1.0)

## 2020-10-29 LAB — FERRITIN: Ferritin: 180 ng/mL (ref 24–336)

## 2020-10-29 LAB — SEDIMENTATION RATE: Sed Rate: 55 mm/hr — ABNORMAL HIGH (ref 0–16)

## 2020-10-29 LAB — D-DIMER, QUANTITATIVE: D-Dimer, Quant: 0.85 ug/mL-FEU — ABNORMAL HIGH (ref 0.00–0.50)

## 2020-10-29 MED ORDER — KETOROLAC TROMETHAMINE 15 MG/ML IJ SOLN
15.0000 mg | Freq: Four times a day (QID) | INTRAMUSCULAR | Status: DC | PRN
  Administered 2020-10-29 – 2020-10-30 (×2): 15 mg via INTRAVENOUS
  Filled 2020-10-29 (×2): qty 1

## 2020-10-29 NOTE — Progress Notes (Signed)
PROGRESS NOTE                                                                                                                                                                                                             Patient Demographics:    Ronald Butler, is a 45 y.o. male, DOB - [birth year], WCH:[id number]  Outpatient Primary MD for the patient is Patient, No Pcp Per (Inactive)   Admit date - 10/28/2020   LOS - 1  Chief Complaint  Patient presents with  . Shortness of Breath    States that it hurts to breathe et that he has a collapsed upper right lung       Brief Narrative: Patient is a 45 y.o. male with PMHx of recurrent spontaneous pneumothorax-previously refused VATS-polysubstance abuse, seizure disorder-apprehended by law enforcement and incarcerated-subsequently developed chest pain and shortness of breath-was found to have right-sided pneumothorax-chest tube placed-patient was then transferred to [facility] for cardiothoracic surgery evaluation.  He was also found to have an incidental COVID-19 infection.   COVID-19 vaccinated status:   Significant Events: 4/30>> transfer to [facility] from [facility]-for CTVS evaluation of recurrent spontaneous pneumothorax  Significant studies: 4/30>> CT chest: Right-sided chest tube, trace pneumothorax-emphysema with prominent subpleural bulla within the right upper/lower lobes.  COVID-19 medications: None  Antibiotics: None  Microbiology data: 4/30>> COVID PCR: Positive  Procedures: 4/30 >> right-sided chest tube placement in the emergency room at [facility]  Consults: Cardiothoracic surgery  DVT prophylaxis: enoxaparin (LOVENOX) injection 40 mg Start: 10/28/20 0745    Subjective:    Ronald Butler today has pain in the right side of his chest.  Otherwise lying comfortably.  Acknowledges heroin use (snorts).   Assessment  & Plan :   Right-sided pneumothorax: Chest tube  in place-this is his third episode of spontaneous pneumothorax-cardiothoracic surgery following.  Defer timing of VATS procedure to cardiothoracic surgery.  COVID-19 infection: Incidental-unclear how long he has this infection-he has no symptoms-apart from observation-doubt any treatment required at this time.  Seizure disorder: Continue Depakote, Vimpat, Zonegran.  Polysubstance abuse: Check UDS-Per H&P-admitted to crack cocaine use-this morning he told me he actually does heroin.  Currently getting IV narcotics to manage pain with the chest tube site.  Watch closely for withdrawal symptoms.  Tobacco abuse: Refused transdermal nicotine   ABG: No results found for:  PHART, PCO2ART, PO2ART, HCO3, TCO2, ACIDBASEDEF, O2SAT  Vent Settings: N/A    Condition - Stable  Family Communication  : None at bedside.  Code Status :  Full Code  Diet :  Diet Order            Diet NPO time specified  Diet effective midnight           Diet regular Room service appropriate? Yes; Fluid consistency: Thin  Diet effective now                  Disposition Plan  :   Status is: Inpatient  Remains inpatient appropriate because:Inpatient level of care appropriate due to severity of illness   [facility]: [facility]              Anticipated d/c is to: Prison              Patient currently is not medically stable to d/c.   Difficult to place patient No     Barriers to discharge: Chest tube in place  Antimicorbials  :    Anti-infectives (From admission, onward)   None      Inpatient Medications  Scheduled Meds: . divalproex  500 mg Oral Daily  . divalproex  750 mg Oral QHS  . enoxaparin (LOVENOX) injection  40 mg Subcutaneous Q24H  . lacosamide  100 mg Oral BID  . sodium chloride flush  3 mL Intravenous Q12H  . zonisamide  300 mg Oral QHS   Continuous Infusions: . sodium chloride     PRN Meds:.sodium chloride, acetaminophen **OR** acetaminophen,  HYDROcodone-acetaminophen, HYDROmorphone (DILAUDID) injection, ondansetron **OR** ondansetron (ZOFRAN) IV, sodium chloride flush   Time Spent in minutes  25  See all Orders from today for further details   Ronald Butler M.D on 10/29/2020 at 11:09 AM  To page go to [email or web address] - use universal password  [facility] -  Office  [phone]    Objective:   Vitals:   10/28/20 2006 10/29/20 0000 10/29/20 0302 10/29/20 0811  BP: 112/74 117/72 128/80 115/78  Pulse: (!) 58 (!) 55 (!) 59 78  Resp: (!) 24 18 19    Temp: 98.7 F (37.1 C) (!) 97.2 F (36.2 C) 98.3 F (36.8 C) 98.5 F (36.9 C)  TempSrc: Axillary Oral Axillary Oral  SpO2: 93% 96% 94% 93%  Weight:        Wt Readings from Last 3 Encounters:  10/28/20 67.4 kg  06/19/20 74.8 kg  05/30/20 75 kg     Intake/Output Summary (Last 24 hours) at 10/29/2020 1109 Last data filed at 10/29/2020 0300 Gross per 24 hour  Intake 0 ml  Output 620 ml  Net -620 ml     Physical Exam Gen Exam:Alert awake-not in any distress HEENT:atraumatic, normocephalic Chest: B/L clear to auscultation anteriorly CVS:S1S2 regular Abdomen:soft non tender, non distended Extremities:no edema Neurology: Non focal Skin: no rash   Data Review:    CBC Recent Labs  Lab 10/28/20 0329 10/29/20 0041  WBC 7.5 5.1  HGB 11.8* 13.4  HCT 35.1* 39.0  PLT 221 252  MCV 90.7 89.2  MCH 30.5 30.7  MCHC 33.6 34.4  RDW 11.9 11.8    Chemistries  Recent Labs  Lab 10/28/20 0329 10/29/20 0041  NA 132* 137  K 3.9 3.7  CL 99 101  CO2 26 26  GLUCOSE 109* 107*  BUN 10 13  CREATININE 0.97 0.89  CALCIUM 8.7* 9.2  MG  --  2.0   ------------------------------------------------------------------------------------------------------------------  No results for input(s): CHOL, HDL, LDLCALC, TRIG, CHOLHDL, LDLDIRECT in the last 72 hours.  Lab Results  Component Value Date   HGBA1C 5.5 04/27/2020    ------------------------------------------------------------------------------------------------------------------ No results for input(s): TSH, T4TOTAL, T3FREE, THYROIDAB in the last 72 hours.  Invalid input(s): FREET3 ------------------------------------------------------------------------------------------------------------------ Recent Labs    10/28/20 0219 10/29/20 0041  VITAMINB12 241  --   FERRITIN 117 180  TIBC 318  --   IRON 16*  --   RETICCTPCT 0.9  --     Coagulation profile No results for input(s): INR, PROTIME in the last 168 hours.  Recent Labs    10/29/20 0041  DDIMER 0.85*    Cardiac Enzymes No results for input(s): CKMB, TROPONINI, MYOGLOBIN in the last 168 hours.  Invalid input(s): CK ------------------------------------------------------------------------------------------------------------------ No results found for: BNP  Micro Results Recent Results (from the past 240 hour(s))  Resp Panel by RT-PCR (Flu A&B, Covid) Nasopharyngeal Swab     Status: Abnormal   Collection Time: 10/28/20  3:14 AM   Specimen: Nasopharyngeal Swab; Nasopharyngeal(NP) swabs in vial transport medium  Result Value Ref Range Status   SARS Coronavirus 2 by RT PCR POSITIVE (A) NEGATIVE Final    Comment: RESULT CALLED TO, READ BACK BY AND VERIFIED WITH: G PRUITT 4/30 @0451  BY S. BEARD (NOTE) SARS-CoV-2 target nucleic acids are DETECTED.  The SARS-CoV-2 RNA is generally detectable in upper respiratory specimens during the acute phase of infection. Positive results are indicative of the presence of the identified virus, but do not rule out bacterial infection or co-infection with other pathogens not detected by the test. Clinical correlation with patient history and other diagnostic information is necessary to determine patient infection status. The expected result is Negative.  Fact Sheet for Patients:  Fact Sheet for Healthcare  Providers: BloggerCourse.com  This test is not yet approved or cleared by the [employer] FDA and  has been authorized for detection and/or diagnosis of SARS-CoV-2 by FDA under an Emergency Use Authorization (EUA).  This EUA will remain in effect (meaning this test can be u sed) for the duration of  the COVID-19 declaration under Section 564(b)(1) of the Act, 21 U.S.C. section 360bbb-3(b)(1), unless the authorization is terminated or revoked sooner.     Influenza A by PCR NEGATIVE NEGATIVE Final   Influenza B by PCR NEGATIVE NEGATIVE Final    Comment: (NOTE) The Xpert Xpress SARS-CoV-2/FLU/RSV plus assay is intended as an aid in the diagnosis of influenza from Nasopharyngeal swab specimens and should not be used as a sole basis for treatment. Nasal washings and aspirates are unacceptable for Xpert Xpress SARS-CoV-2/FLU/RSV testing.  Fact Sheet for Patients: [place]  Fact Sheet for Healthcare Providers: BloggerCourse.com  This test is not yet approved or cleared by the [employer].[place] FDA and has been authorized for detection and/or diagnosis of SARS-CoV-2 by FDA under an Emergency Use Authorization (EUA). This EUA will remain in effect (meaning this test can be used) for the duration of the COVID-19 declaration under Section 564(b)(1) of the Act, 21 U.S.C. section 360bbb-3(b)(1), unless the authorization is terminated or revoked.  Performed at [facility], [address] [place], [place]     Radiology Reports CT CHEST WO CONTRAST  Result Date: 10/28/2020 CLINICAL DATA:  Pneumothorax, COVID-19 positive, chest tube EXAM: CT CHEST WITHOUT CONTRAST TECHNIQUE: Multidetector CT imaging of the chest was performed following the standard protocol without IV contrast. COMPARISON:  10/28/2020 FINDINGS: Cardiovascular: Unenhanced imaging of the heart and great vessels demonstrates  no pericardial  effusion. Normal caliber of the thoracic aorta. Mediastinum/Nodes: No enlarged mediastinal or axillary lymph nodes. Thyroid gland, trachea, and esophagus demonstrate no significant findings. Lungs/Pleura: There is an indwelling right-sided chest tube via a right anterolateral approach. Trace residual right basilar pneumothorax is identified, volume estimated far less than 5%. Emphysematous changes are seen, upper lobe predominant, with numerous subpleural bullous seen within the right lower and right upper lobes. Dependent consolidation within the right lower lobe consistent with atelectasis. Trace right pleural effusion. Central airways are patent. Upper Abdomen: No acute abnormality. Musculoskeletal: No acute or destructive bony lesions. Reconstructed images demonstrate no additional findings. IMPRESSION: 1. Right-sided chest tube as above, with trace residual right basilar pneumothorax volume estimated less than 5%. 2. Emphysema, with prominent subpleural bulla within the right upper and right lower lobes. 3. Minimal dependent lower lobe atelectasis, with trace right pleural effusion. Electronically Signed   By: Sharlet SalinaMichael  Brown M.D.   On: 10/28/2020 16:08   DG Chest Port 1 View  Result Date: 10/28/2020 CLINICAL DATA:  Shortness of breath with history of spontaneous pneumothorax. EXAM: PORTABLE CHEST 1 VIEW COMPARISON:  June 19, 2020 FINDINGS: A small to moderate size pneumothorax is seen along the lateral aspect of the mid and lower right hemithorax. Mild atelectasis is seen within the medial aspect of the right lung base. There is no evidence of a pleural effusion. The heart size and mediastinal contours are within normal limits. Radiopaque surgical clips are seen overlying the right upper quadrant. The visualized skeletal structures are unremarkable. IMPRESSION: Small to moderate sized right-sided pneumothorax. Electronically Signed   By: Aram Candelahaddeus  Houston M.D.   On: 10/28/2020 02:49   DG Chest Port 1V  same Day  Result Date: 10/28/2020 CLINICAL DATA:  Chest tube insertion, COVID-19 positive EXAM: PORTABLE CHEST 1 VIEW COMPARISON:  Radiograph 10/28/2020 FINDINGS: Patient is post placement of a right chest tube which terminates in the mid right lung towards midline. Small residual lateral right pneumothorax and some questionable trace right effusion. No left pneumothorax or pleural fluid. Some additional coarsened interstitial opacities are present which could reflect atelectasis or atypical infection in the setting of COVID 19. Stable cardiomediastinal contours. Telemetry leads overlie the chest. IMPRESSION: Interval placement of a right chest tube with decrease in size of the previously seen right pneumothorax Small residual right pneumothorax and trace right pleural fluid remains. Some coarsened interstitial opacities, possibly atelectasis versus atypical infection in the setting of COVID-19. Electronically Signed   By: Kreg ShropshirePrice  DeHay M.D.   On: 10/28/2020 06:50

## 2020-10-29 NOTE — Progress Notes (Signed)
Nurse attempted to have the patient sign a consent form for a procedure that is scheduled to be done tomorrow. Patient is weak and was not able to write a legible signature. Patient did verbalize that he understood the procedure to scheduled for tomorrow. Consent placed in chart.

## 2020-10-29 NOTE — Progress Notes (Signed)
     [address]       [name] [address]             [phone]       Images reviewed. Significant bullous disease. We will plan for wedge resection tomorrow.  Kivon Aprea Keane Scrape

## 2020-10-29 NOTE — Progress Notes (Signed)
Patient called for RN assistance approximately 0250, patient complaining of nausea and vomiting at this time. While vomiting, patient's HR briefly decreased to a rate of 37 for less than 5 seconds before increasing to the 80's. Patient vomited approximately 320 cc of yellowish emesis with undigested food particles noted in the emesis. Patient medicated with zofran and dilaudid for complaints of 6/10 right chest tube incisional pain and headache pain. Vomiting has resolved at this time. Chest tube disconnected from suction while patient vomiting, RN reconnected, no further issues noted with chest tube and patient's oxygen saturations are 92-95% on room air at this time. No changes noted to lung sounds from previous assessment. Neuro check WNL. Call light within reach, will continue to monitor.

## 2020-10-30 ENCOUNTER — Encounter (HOSPITAL_COMMUNITY)
Admission: EM | Disposition: A | Discharge status: Home / Self Care | Payer: Self-pay | Source: Home / Self Care | Attending: Internal Medicine

## 2020-10-30 ENCOUNTER — Inpatient Hospital Stay (HOSPITAL_COMMUNITY): Payer: Self-pay | Admitting: Certified Registered"

## 2020-10-30 ENCOUNTER — Inpatient Hospital Stay (HOSPITAL_COMMUNITY): Payer: Self-pay

## 2020-10-30 HISTORY — PX: INTERCOSTAL NERVE BLOCK: SHX5021

## 2020-10-30 HISTORY — PX: XI ROBOTIC ASSISTED THORACOSCOPY PLEURECTOMY: SHX6888

## 2020-10-30 LAB — C-REACTIVE PROTEIN: CRP: 1 mg/dL — ABNORMAL HIGH (ref <1.0)

## 2020-10-30 LAB — SEDIMENTATION RATE: Sed Rate: 33 mm/hr — ABNORMAL HIGH (ref 0–16)

## 2020-10-30 LAB — FERRITIN: Ferritin: 179 ng/mL (ref 24–336)

## 2020-10-30 LAB — D-DIMER, QUANTITATIVE: D-Dimer, Quant: 0.94 ug/mL-FEU — ABNORMAL HIGH (ref 0.00–0.50)

## 2020-10-30 SURGERY — WEDGE RESECTION, LUNG, ROBOT-ASSISTED, THORACOSCOPIC
Anesthesia: General | Site: Chest | Laterality: Right

## 2020-10-30 MED ORDER — LACTATED RINGERS IV SOLN: INTRAVENOUS | Status: DC | PRN

## 2020-10-30 MED ORDER — OXYCODONE HCL 5 MG/5ML PO SOLN: 5.0000 mg | Freq: Once | ORAL | Status: DC | PRN

## 2020-10-30 MED ORDER — 0.9 % SODIUM CHLORIDE (POUR BTL) OPTIME
TOPICAL | Status: DC | PRN
  Administered 2020-10-30: 2000 mL

## 2020-10-30 MED ORDER — SENNOSIDES-DOCUSATE SODIUM 8.6-50 MG PO TABS
1.0000 | ORAL_TABLET | Freq: Every day | ORAL | Status: DC
  Administered 2020-10-30 – 2020-12-02 (×17): 1 via ORAL
  Filled 2020-10-30 (×27): qty 1

## 2020-10-30 MED ORDER — BUPIVACAINE LIPOSOME 1.3 % IJ SUSP
INTRAMUSCULAR | Status: AC
  Filled 2020-10-30: qty 20

## 2020-10-30 MED ORDER — ONDANSETRON HCL 4 MG/2ML IJ SOLN
INTRAMUSCULAR | Status: DC | PRN
  Administered 2020-10-30: 4 mg via INTRAVENOUS

## 2020-10-30 MED ORDER — HYDROMORPHONE HCL 1 MG/ML IJ SOLN
INTRAMUSCULAR | Status: AC
  Filled 2020-10-30: qty 2

## 2020-10-30 MED ORDER — HYDROMORPHONE HCL 1 MG/ML IJ SOLN
0.2500 mg | INTRAMUSCULAR | Status: DC | PRN
  Administered 2020-10-30 (×4): 0.5 mg via INTRAVENOUS

## 2020-10-30 MED ORDER — KETAMINE HCL 10 MG/ML IJ SOLN
INTRAMUSCULAR | Status: DC | PRN
  Administered 2020-10-30: 25 mg via INTRAVENOUS

## 2020-10-30 MED ORDER — BUPIVACAINE LIPOSOME 1.3 % IJ SUSP
INTRAMUSCULAR | Status: DC | PRN
  Administered 2020-10-30: 100 mL

## 2020-10-30 MED ORDER — KETAMINE HCL 50 MG/5ML IJ SOSY
PREFILLED_SYRINGE | INTRAMUSCULAR | Status: AC
  Filled 2020-10-30: qty 5

## 2020-10-30 MED ORDER — CEFAZOLIN SODIUM-DEXTROSE 2-4 GM/100ML-% IV SOLN
2.0000 g | Freq: Three times a day (TID) | INTRAVENOUS | Status: AC
  Administered 2020-10-30 – 2020-10-31 (×2): 2 g via INTRAVENOUS
  Filled 2020-10-30 (×3): qty 100

## 2020-10-30 MED ORDER — BUPIVACAINE HCL (PF) 0.5 % IJ SOLN
INTRAMUSCULAR | Status: AC
  Filled 2020-10-30: qty 30

## 2020-10-30 MED ORDER — ACETAMINOPHEN 500 MG PO TABS
1000.0000 mg | ORAL_TABLET | Freq: Four times a day (QID) | ORAL | Status: DC
  Administered 2020-10-30 – 2020-10-31 (×5): 1000 mg via ORAL
  Filled 2020-10-30 (×7): qty 2

## 2020-10-30 MED ORDER — CEFAZOLIN SODIUM-DEXTROSE 2-3 GM-%(50ML) IV SOLR
INTRAVENOUS | Status: DC | PRN
  Administered 2020-10-30: 2 g via INTRAVENOUS

## 2020-10-30 MED ORDER — OXYCODONE HCL 5 MG PO TABS: 5.0000 mg | ORAL_TABLET | Freq: Once | ORAL | Status: DC | PRN

## 2020-10-30 MED ORDER — BISACODYL 5 MG PO TBEC
10.0000 mg | DELAYED_RELEASE_TABLET | Freq: Every day | ORAL | Status: DC
  Administered 2020-10-30 – 2020-11-18 (×12): 10 mg via ORAL
  Filled 2020-10-30 (×18): qty 2

## 2020-10-30 MED ORDER — SUGAMMADEX SODIUM 200 MG/2ML IV SOLN
INTRAVENOUS | Status: DC | PRN
  Administered 2020-10-30: 200 mg via INTRAVENOUS

## 2020-10-30 MED ORDER — MIDAZOLAM HCL 5 MG/5ML IJ SOLN
INTRAMUSCULAR | Status: DC | PRN
  Administered 2020-10-30: 2 mg via INTRAVENOUS

## 2020-10-30 MED ORDER — ENOXAPARIN SODIUM 40 MG/0.4ML IJ SOSY
40.0000 mg | PREFILLED_SYRINGE | INTRAMUSCULAR | Status: DC
  Filled 2020-10-30: qty 0.4

## 2020-10-30 MED ORDER — FENTANYL CITRATE (PF) 100 MCG/2ML IJ SOLN
INTRAMUSCULAR | Status: DC | PRN
  Administered 2020-10-30: 150 ug via INTRAVENOUS
  Administered 2020-10-30: 100 ug via INTRAVENOUS

## 2020-10-30 MED ORDER — ROCURONIUM BROMIDE 10 MG/ML (PF) SYRINGE
PREFILLED_SYRINGE | INTRAVENOUS | Status: DC | PRN
  Administered 2020-10-30: 20 mg via INTRAVENOUS
  Administered 2020-10-30: 50 mg via INTRAVENOUS

## 2020-10-30 MED ORDER — MIDAZOLAM HCL 2 MG/2ML IJ SOLN
INTRAMUSCULAR | Status: AC
  Filled 2020-10-30: qty 2

## 2020-10-30 MED ORDER — ACETAMINOPHEN 160 MG/5ML PO SOLN
1000.0000 mg | Freq: Four times a day (QID) | ORAL | Status: DC
  Administered 2020-11-01: 1000 mg via ORAL
  Filled 2020-10-30 (×2): qty 40.6

## 2020-10-30 MED ORDER — LIDOCAINE 2% (20 MG/ML) 5 ML SYRINGE
INTRAMUSCULAR | Status: DC | PRN
  Administered 2020-10-30: 20 mg via INTRAVENOUS

## 2020-10-30 MED ORDER — PROPOFOL 10 MG/ML IV BOLUS
INTRAVENOUS | Status: AC
  Filled 2020-10-30: qty 20

## 2020-10-30 MED ORDER — PHENYLEPHRINE 40 MCG/ML (10ML) SYRINGE FOR IV PUSH (FOR BLOOD PRESSURE SUPPORT)
PREFILLED_SYRINGE | INTRAVENOUS | Status: DC | PRN
  Administered 2020-10-30 (×2): 80 ug via INTRAVENOUS

## 2020-10-30 MED ORDER — KETOROLAC TROMETHAMINE 15 MG/ML IJ SOLN
30.0000 mg | Freq: Four times a day (QID) | INTRAMUSCULAR | Status: AC
  Administered 2020-10-30 – 2020-11-02 (×10): 30 mg via INTRAVENOUS
  Filled 2020-10-30 (×10): qty 2

## 2020-10-30 MED ORDER — MIDAZOLAM HCL 2 MG/2ML IJ SOLN
0.5000 mg | Freq: Once | INTRAMUSCULAR | Status: AC | PRN
  Administered 2020-10-30: 1 mg via INTRAVENOUS

## 2020-10-30 MED ORDER — DEXAMETHASONE SODIUM PHOSPHATE 10 MG/ML IJ SOLN
INTRAMUSCULAR | Status: DC | PRN
  Administered 2020-10-30: 10 mg via INTRAVENOUS

## 2020-10-30 MED ORDER — PROPOFOL 10 MG/ML IV BOLUS
INTRAVENOUS | Status: DC | PRN
  Administered 2020-10-30: 200 mg via INTRAVENOUS

## 2020-10-30 MED ORDER — MEPERIDINE HCL 25 MG/ML IJ SOLN
INTRAMUSCULAR | Status: AC
  Filled 2020-10-30: qty 1

## 2020-10-30 MED ORDER — MEPERIDINE HCL 25 MG/ML IJ SOLN: 6.2500 mg | INTRAMUSCULAR | Status: DC | PRN

## 2020-10-30 MED ORDER — FENTANYL CITRATE (PF) 250 MCG/5ML IJ SOLN
INTRAMUSCULAR | Status: AC
  Filled 2020-10-30: qty 5

## 2020-10-30 MED ORDER — PROMETHAZINE HCL 25 MG/ML IJ SOLN: 6.2500 mg | INTRAMUSCULAR | Status: DC | PRN

## 2020-10-30 MED ORDER — EPHEDRINE SULFATE-NACL 50-0.9 MG/10ML-% IV SOSY
PREFILLED_SYRINGE | INTRAVENOUS | Status: DC | PRN
  Administered 2020-10-30: 5 mg via INTRAVENOUS

## 2020-10-30 SURGICAL SUPPLY — 103 items
ADH SKN CLS APL DERMABOND .7 (GAUZE/BANDAGES/DRESSINGS) ×1
APL PRP STRL LF DISP 70% ISPRP (MISCELLANEOUS) ×1
BLADE CLIPPER SURG (BLADE) IMPLANT
BNDG COHESIVE 6X5 TAN STRL LF (GAUZE/BANDAGES/DRESSINGS) IMPLANT
CANISTER SUCT 3000ML PPV (MISCELLANEOUS) ×4 IMPLANT
CANNULA REDUC XI 12-8 STAPL (CANNULA) ×2
CANNULA REDUCER 12-8 DVNC XI (CANNULA) ×2 IMPLANT
CATH THORACIC 28FR (CATHETERS) ×2 IMPLANT
CATH THORACIC 28FR RT ANG (CATHETERS) IMPLANT
CATH THORACIC 36FR (CATHETERS) IMPLANT
CATH THORACIC 36FR RT ANG (CATHETERS) IMPLANT
CATH TROCAR 20FR (CATHETERS) IMPLANT
CHLORAPREP W/TINT 26 (MISCELLANEOUS) ×2 IMPLANT
CLEANER TIP ELECTROSURG 2X2 (MISCELLANEOUS) ×2 IMPLANT
CLIP VESOCCLUDE MED 6/CT (CLIP) ×2 IMPLANT
CNTNR URN SCR LID CUP LEK RST (MISCELLANEOUS) ×4 IMPLANT
CONN ST 1/4X3/8  BEN (MISCELLANEOUS)
CONN ST 1/4X3/8 BEN (MISCELLANEOUS) IMPLANT
CONT SPEC 4OZ STRL OR WHT (MISCELLANEOUS) ×8
COVER SURGICAL LIGHT HANDLE (MISCELLANEOUS) IMPLANT
DEFOGGER SCOPE WARMER CLEARIFY (MISCELLANEOUS) ×2 IMPLANT
DERMABOND ADVANCED (GAUZE/BANDAGES/DRESSINGS) ×1
DERMABOND ADVANCED .7 DNX12 (GAUZE/BANDAGES/DRESSINGS) ×1 IMPLANT
DRAIN CHANNEL 28F RND 3/8 FF (WOUND CARE) IMPLANT
DRAIN CHANNEL 32F RND 10.7 FF (WOUND CARE) IMPLANT
DRAPE ARM DVNC X/XI (DISPOSABLE) ×4 IMPLANT
DRAPE COLUMN DVNC XI (DISPOSABLE) ×1 IMPLANT
DRAPE CV SPLIT W-CLR ANES SCRN (DRAPES) ×2 IMPLANT
DRAPE DA VINCI XI ARM (DISPOSABLE) ×4
DRAPE DA VINCI XI COLUMN (DISPOSABLE) ×1
DRAPE ORTHO SPLIT 77X108 STRL (DRAPES) ×2
DRAPE SURG ORHT 6 SPLT 77X108 (DRAPES) ×1 IMPLANT
ELECT BLADE 6.5 EXT (BLADE) IMPLANT
ELECT REM PT RETURN 9FT ADLT (ELECTROSURGICAL) ×2
ELECTRODE REM PT RTRN 9FT ADLT (ELECTROSURGICAL) ×1 IMPLANT
GAUZE KITTNER 4X5 RF (MISCELLANEOUS) ×2 IMPLANT
GAUZE SPONGE 4X4 12PLY STRL (GAUZE/BANDAGES/DRESSINGS) ×2 IMPLANT
GLOVE BIO SURGEON STRL SZ7.5 (GLOVE) ×4 IMPLANT
GOWN STRL REUS W/ TWL LRG LVL3 (GOWN DISPOSABLE) ×2 IMPLANT
GOWN STRL REUS W/ TWL XL LVL3 (GOWN DISPOSABLE) ×3 IMPLANT
GOWN STRL REUS W/TWL 2XL LVL3 (GOWN DISPOSABLE) ×2 IMPLANT
GOWN STRL REUS W/TWL LRG LVL3 (GOWN DISPOSABLE) ×4
GOWN STRL REUS W/TWL XL LVL3 (GOWN DISPOSABLE) ×6
HEMOSTAT SURGICEL 2X14 (HEMOSTASIS) ×2 IMPLANT
KIT BASIN OR (CUSTOM PROCEDURE TRAY) ×2 IMPLANT
KIT SUCTION CATH 14FR (SUCTIONS) IMPLANT
KIT TURNOVER KIT B (KITS) ×2 IMPLANT
LOOP VESSEL SUPERMAXI WHITE (MISCELLANEOUS) IMPLANT
NEEDLE 22X1 1/2 (OR ONLY) (NEEDLE) ×2 IMPLANT
NEEDLE HYPO 25GX1X1/2 BEV (NEEDLE) ×2 IMPLANT
NS IRRIG 1000ML POUR BTL (IV SOLUTION) ×6 IMPLANT
OBTURATOR OPTICAL STANDARD 8MM (TROCAR)
OBTURATOR OPTICAL STND 8 DVNC (TROCAR)
OBTURATOR OPTICALSTD 8 DVNC (TROCAR) IMPLANT
PACK CHEST (CUSTOM PROCEDURE TRAY) ×2 IMPLANT
PAD ARMBOARD 7.5X6 YLW CONV (MISCELLANEOUS) ×10 IMPLANT
PORT ACCESS TROCAR AIRSEAL 12 (TROCAR) ×1 IMPLANT
PORT ACCESS TROCAR AIRSEAL 5M (TROCAR) ×1
RELOAD STAPLER 3.5X45 BLU DVNC (STAPLE) ×6 IMPLANT
SEAL CANN UNIV 5-8 DVNC XI (MISCELLANEOUS) ×2 IMPLANT
SEAL XI 5MM-8MM UNIVERSAL (MISCELLANEOUS) ×2
SEALANT PROGEL (MISCELLANEOUS) IMPLANT
SEALANT SURG COSEAL 4ML (VASCULAR PRODUCTS) IMPLANT
SEALANT SURG COSEAL 8ML (VASCULAR PRODUCTS) IMPLANT
SET TRI-LUMEN FLTR TB AIRSEAL (TUBING) ×2 IMPLANT
SOLUTION ELECTROLUBE (MISCELLANEOUS) IMPLANT
SPONGE INTESTINAL PEANUT (DISPOSABLE) IMPLANT
STAPLER 45 DA VINCI SURE FORM (STAPLE) ×1
STAPLER 45 SUREFORM DVNC (STAPLE) ×1 IMPLANT
STAPLER CANNULA SEAL DVNC XI (STAPLE) ×2 IMPLANT
STAPLER CANNULA SEAL XI (STAPLE) ×2
STAPLER RELOAD 3.5X45 BLU DVNC (STAPLE) ×6
STAPLER RELOAD 3.5X45 BLUE (STAPLE) ×6
STOPCOCK 4 WAY LG BORE MALE ST (IV SETS) ×2 IMPLANT
SUT MON AB 2-0 CT1 36 (SUTURE) IMPLANT
SUT PDS AB 1 CTX 36 (SUTURE) IMPLANT
SUT PROLENE 4 0 RB 1 (SUTURE)
SUT PROLENE 4-0 RB1 .5 CRCL 36 (SUTURE) IMPLANT
SUT SILK  1 MH (SUTURE) ×2
SUT SILK 1 MH (SUTURE) ×2 IMPLANT
SUT SILK 1 TIES 10X30 (SUTURE) IMPLANT
SUT SILK 2 0 SH (SUTURE) IMPLANT
SUT SILK 2 0SH CR/8 30 (SUTURE) IMPLANT
SUT VIC AB 1 CTX 36 (SUTURE)
SUT VIC AB 1 CTX36XBRD ANBCTR (SUTURE) IMPLANT
SUT VIC AB 2-0 CT1 27 (SUTURE) ×2
SUT VIC AB 2-0 CT1 TAPERPNT 27 (SUTURE) ×1 IMPLANT
SUT VIC AB 3-0 SH 27 (SUTURE) ×4
SUT VIC AB 3-0 SH 27X BRD (SUTURE) ×2 IMPLANT
SUT VICRYL 0 TIES 12 18 (SUTURE) ×2 IMPLANT
SUT VICRYL 0 UR6 27IN ABS (SUTURE) ×2 IMPLANT
SUT VICRYL 2 TP 1 (SUTURE) IMPLANT
SYR 10ML LL (SYRINGE) ×2 IMPLANT
SYR 20ML LL LF (SYRINGE) ×2 IMPLANT
SYR 50ML LL SCALE MARK (SYRINGE) ×2 IMPLANT
SYSTEM SAHARA CHEST DRAIN ATS (WOUND CARE) ×2 IMPLANT
TAPE CLOTH 4X10 WHT NS (GAUZE/BANDAGES/DRESSINGS) ×2 IMPLANT
TIP APPLICATOR SPRAY EXTEND 16 (VASCULAR PRODUCTS) IMPLANT
TOWEL GREEN STERILE (TOWEL DISPOSABLE) ×2 IMPLANT
TRAY FOLEY MTR SLVR 16FR STAT (SET/KITS/TRAYS/PACK) IMPLANT
TROCAR BLADELESS 15MM (ENDOMECHANICALS) IMPLANT
TUBING EXTENTION W/L.L. (IV SETS) ×2 IMPLANT
WATER STERILE IRR 1000ML POUR (IV SOLUTION) ×2 IMPLANT

## 2020-10-30 NOTE — Progress Notes (Signed)
PROGRESS NOTE                                                                                                                                                                                                             Patient Demographics:    Ronald Butler, is a 45 y.o. male, DOB - [birth year], UYQ:[id number]  Outpatient Primary MD for the patient is Patient, No Pcp Per (Inactive)   Admit date - 10/28/2020   LOS - 2  Chief Complaint  Patient presents with  . Shortness of Breath    States that it hurts to breathe et that he has a collapsed upper right lung       Brief Narrative: Patient is a 45 y.o. male with PMHx of recurrent spontaneous pneumothorax-previously refused VATS-polysubstance abuse, seizure disorder-apprehended by law enforcement and incarcerated-subsequently developed chest pain and shortness of breath-was found to have right-sided pneumothorax-chest tube placed-patient was then transferred to [facility] for cardiothoracic surgery evaluation.  He was also found to have an incidental COVID-19 infection.   COVID-19 vaccinated status:   Significant Events: 4/30>> transfer to [facility] from [facility]-for CTVS evaluation of recurrent spontaneous pneumothorax  Significant studies: 4/30>> CT chest: Right-sided chest tube, trace pneumothorax-emphysema with prominent subpleural bulla within the right upper/lower lobes.  COVID-19 medications: None  Antibiotics: None  Microbiology data: 4/30>> COVID PCR: Positive  Procedures: 4/30 >> right-sided chest tube placement in the emergency room at [facility] 5/2>> right thoracoscopy-wedge resection  Consults: Cardiothoracic surgery  DVT prophylaxis: enoxaparin (LOVENOX) injection 40 mg Start: 10/28/20 0745    Subjective:   Seen earlier today-before OR-apart from pain at the chest tube site-no major issues.   Assessment  & Plan :   Right-sided pneumothorax: Chest  tube in place-this is his third episode of spontaneous pneumothorax-s/p VATS procedure and wedge resection today.  Defer further care to cardiothoracic surgery.  COVID-19 infection: Incidental-unclear how long he has this infection-he has no symptoms-apart from observation-doubt any treatment required at this time.  Seizure disorder: Continue Depakote, Vimpat, Zonegran.  Polysubstance abuse: Check UDS-Per H&P-admitted to crack cocaine use to admitting MD-claims he only did heroin to this MD.  UDS checked-positive both narcotics and cocaine.  We will need continued counseling  Tobacco abuse: Refused transdermal nicotine   ABG: No results found for: PHART, PCO2ART, PO2ART, HCO3, TCO2, ACIDBASEDEF, O2SAT  Vent Settings: N/A    Condition - Stable  Family Communication  : None at bedside.  Code Status :  Full Code  Diet :  Diet Order            Diet NPO time specified  Diet effective midnight                  Disposition Plan  :   Status is: Inpatient  Remains inpatient appropriate because:Inpatient level of care appropriate due to severity of illness   Dispo: The [facility] from: Prison              Anticipated d/c is to: Prison              Patient currently is not medically stable to d/c.   Difficult to place patient No     Barriers to discharge: Chest tube in place  Antimicorbials  :    Anti-infectives (From admission, onward)   None      Inpatient Medications  Scheduled Meds: . [MAR Hold] divalproex  500 mg Oral Daily  . [MAR Hold] divalproex  750 mg Oral QHS  . [MAR Hold] enoxaparin (LOVENOX) injection  40 mg Subcutaneous Q24H  . [MAR Hold] lacosamide  100 mg Oral BID  . [MAR Hold] sodium chloride flush  3 mL Intravenous Q12H  . [MAR Hold] zonisamide  300 mg Oral QHS   Continuous Infusions: . [MAR Hold] sodium chloride     PRN Meds:.[MAR Hold] sodium chloride, [MAR Hold] acetaminophen **OR** [MAR Hold] acetaminophen, [MAR Hold]  HYDROcodone-acetaminophen, HYDROmorphone (DILAUDID) injection, [MAR Hold]  HYDROmorphone (DILAUDID) injection, [MAR Hold] ketorolac, meperidine (DEMEROL) injection, midazolam, [MAR Hold] ondansetron **OR** [MAR Hold] ondansetron (ZOFRAN) IV, oxyCODONE **OR** oxyCODONE, promethazine, [MAR Hold] sodium chloride flush   Time Spent in minutes  25  See all Orders from today for further details   Jeoffrey Massed M.D on 10/30/2020 at 3:42 PM  To page go to [email or web address] - use universal password  [facility] -  Office  [phone]    Objective:   Vitals:   10/29/20 2128 10/30/20 0437 10/30/20 0504 10/30/20 1535  BP: 110/64 106/69 110/67 111/74  Pulse: (!) 56 (!) 51 (!) 49 97  Resp: 18 16 20  (!) 21  Temp: 97.6 F (36.4 C) 97.7 F (36.5 C)  (!) 97 F (36.1 C)  TempSrc: Oral Oral    SpO2: 100% 100% 97% 98%  Weight:        Wt Readings from Last 3 Encounters:  10/28/20 67.4 kg  06/19/20 74.8 kg  05/30/20 75 kg     Intake/Output Summary (Last 24 hours) at 10/30/2020 1542 Last data filed at 10/30/2020 1510 Gross per 24 hour  Intake 1000 ml  Output 396 ml  Net 604 ml     Physical Exam Gen Exam:Alert awake-not in any distress HEENT:atraumatic, normocephalic Chest: B/L clear to auscultation anteriorly CVS:S1S2 regular Abdomen:soft non tender, non distended Extremities:no edema Neurology: Non focal Skin: no rash   Data Review:    CBC Recent Labs  Lab 10/28/20 0329 10/29/20 0041  WBC 7.5 5.1  HGB 11.8* 13.4  HCT 35.1* 39.0  PLT 221 252  MCV 90.7 89.2  MCH 30.5 30.7  MCHC 33.6 34.4  RDW 11.9 11.8    Chemistries  Recent Labs  Lab 10/28/20 0329 10/29/20 0041  NA 132* 137  K 3.9 3.7  CL 99 101  CO2 26 26  GLUCOSE 109* 107*  BUN 10 13  CREATININE 0.97  0.89  CALCIUM 8.7* 9.2  MG  --  2.0   ------------------------------------------------------------------------------------------------------------------ No results for input(s): CHOL, HDL, LDLCALC,  TRIG, CHOLHDL, LDLDIRECT in the last 72 hours.  Lab Results  Component Value Date   HGBA1C 5.5 04/27/2020   ------------------------------------------------------------------------------------------------------------------ No results for input(s): TSH, T4TOTAL, T3FREE, THYROIDAB in the last 72 hours.  Invalid input(s): FREET3 ------------------------------------------------------------------------------------------------------------------ Recent Labs    10/28/20 0219 10/28/20 1132 10/29/20 0041 10/30/20 0044  VITAMINB12 241  --   --   --   FOLATE  --  7.5  --   --   FERRITIN 117  --  180 179  TIBC 318  --   --   --   IRON 16*  --   --   --   RETICCTPCT 0.9  --   --   --     Coagulation profile No results for input(s): INR, PROTIME in the last 168 hours.  Recent Labs    10/29/20 0041 10/30/20 0044  DDIMER 0.85* 0.94*    Cardiac Enzymes No results for input(s): CKMB, TROPONINI, MYOGLOBIN in the last 168 hours.  Invalid input(s): CK ------------------------------------------------------------------------------------------------------------------ No results found for: BNP  Micro Results Recent Results (from the past 240 hour(s))  Resp Panel by RT-PCR (Flu A&B, Covid) Nasopharyngeal Swab     Status: Abnormal   Collection Time: 10/28/20  3:14 AM   Specimen: Nasopharyngeal Swab; Nasopharyngeal(NP) swabs in vial transport medium  Result Value Ref Range Status   SARS Coronavirus 2 by RT PCR POSITIVE (A) NEGATIVE Final    Comment: RESULT CALLED TO, READ BACK BY AND VERIFIED WITH: G PRUITT 4/30 @0451  BY S. BEARD (NOTE) SARS-CoV-2 target nucleic acids are DETECTED.  The SARS-CoV-2 RNA is generally detectable in upper respiratory specimens during the acute phase of infection. Positive results are indicative of the presence of the identified virus, but do not rule out bacterial infection or co-infection with other pathogens not detected by the test. Clinical correlation  with patient history and other diagnostic information is necessary to determine patient infection status. The expected result is Negative.  Fact Sheet for Patients:  Fact Sheet for Healthcare Providers: BloggerCourse.com  This test is not yet approved or cleared by the [place] FDA and  has been authorized for detection and/or diagnosis of SARS-CoV-2 by FDA under an Emergency Use Authorization (EUA).  This EUA will remain in effect (meaning this test can be u sed) for the duration of  the COVID-19 declaration under Section 564(b)(1) of the Act, 21 U.S.C. section 360bbb-3(b)(1), unless the authorization is terminated or revoked sooner.     Influenza A by PCR NEGATIVE NEGATIVE Final   Influenza B by PCR NEGATIVE NEGATIVE Final    Comment: (NOTE) The Xpert Xpress SARS-CoV-2/FLU/RSV plus assay is intended as an aid in the diagnosis of influenza from Nasopharyngeal swab specimens and should not be used as a sole basis for treatment. Nasal washings and aspirates are unacceptable for Xpert Xpress SARS-CoV-2/FLU/RSV testing.  Fact Sheet for Patients: [place]  Fact Sheet for Healthcare Providers: BloggerCourse.com  This test is not yet approved or cleared by the [employer].[place] FDA and has been authorized for detection and/or diagnosis of SARS-CoV-2 by FDA under an Emergency Use Authorization (EUA). This EUA will remain in effect (meaning this test can be used) for the duration of the COVID-19 declaration under Section 564(b)(1) of the Act, 21 U.S.C. section 360bbb-3(b)(1), unless the authorization is terminated or revoked.  Performed at [facility], [address] [place], [place] [address]     Radiology [address] CHEST WO CONTRAST  Result Date: 10/28/2020 CLINICAL DATA:  Pneumothorax, COVID-19 positive, chest tube EXAM: CT CHEST WITHOUT CONTRAST  TECHNIQUE: Multidetector CT imaging of the chest was performed following the standard protocol without IV contrast. COMPARISON:  10/28/2020 FINDINGS: Cardiovascular: Unenhanced imaging of the heart and great vessels demonstrates no pericardial effusion. Normal caliber of the thoracic aorta. Mediastinum/Nodes: No enlarged mediastinal or axillary lymph nodes. Thyroid gland, trachea, and esophagus demonstrate no significant findings. Lungs/Pleura: There is an indwelling right-sided chest tube via a right anterolateral approach. Trace residual right basilar pneumothorax is identified, volume estimated far less than 5%. Emphysematous changes are seen, upper lobe predominant, with numerous subpleural bullous seen within the right lower and right upper lobes. Dependent consolidation within the right lower lobe consistent with atelectasis. Trace right pleural effusion. Central airways are patent. Upper Abdomen: No acute abnormality. Musculoskeletal: No acute or destructive bony lesions. Reconstructed images demonstrate no additional findings. IMPRESSION: 1. Right-sided chest tube as above, with trace residual right basilar pneumothorax volume estimated less than 5%. 2. Emphysema, with prominent subpleural bulla within the right upper and right lower lobes. 3. Minimal dependent lower lobe atelectasis, with trace right pleural effusion. Electronically Signed   By: Sharlet SalinaMichael  Brown M.D.   On: 10/28/2020 16:08   DG Chest Port 1 View  Result Date: 10/28/2020 CLINICAL DATA:  Shortness of breath with history of spontaneous pneumothorax. EXAM: PORTABLE CHEST 1 VIEW COMPARISON:  June 19, 2020 FINDINGS: A small to moderate size pneumothorax is seen along the lateral aspect of the mid and lower right hemithorax. Mild atelectasis is seen within the medial aspect of the right lung base. There is no evidence of a pleural effusion. The heart size and mediastinal contours are within normal limits. Radiopaque surgical clips are seen  overlying the right upper quadrant. The visualized skeletal structures are unremarkable. IMPRESSION: Small to moderate sized right-sided pneumothorax. Electronically Signed   By: Aram Candelahaddeus  Houston M.D.   On: 10/28/2020 02:49   DG Chest Port 1V same Day  Result Date: 10/28/2020 CLINICAL DATA:  Chest tube insertion, COVID-19 positive EXAM: PORTABLE CHEST 1 VIEW COMPARISON:  Radiograph 10/28/2020 FINDINGS: Patient is post placement of a right chest tube which terminates in the mid right lung towards midline. Small residual lateral right pneumothorax and some questionable trace right effusion. No left pneumothorax or pleural fluid. Some additional coarsened interstitial opacities are present which could reflect atelectasis or atypical infection in the setting of COVID 19. Stable cardiomediastinal contours. Telemetry leads overlie the chest. IMPRESSION: Interval placement of a right chest tube with decrease in size of the previously seen right pneumothorax Small residual right pneumothorax and trace right pleural fluid remains. Some coarsened interstitial opacities, possibly atelectasis versus atypical infection in the setting of COVID-19. Electronically Signed   By: Kreg ShropshirePrice  DeHay M.D.   On: 10/28/2020 06:50

## 2020-10-30 NOTE — Anesthesia Postprocedure Evaluation (Signed)
Anesthesia Post Note  Patient: Ronald Butler  Procedure(s) Performed: XI ROBOTIC ASSISTED THORASCOPY-WEDGE RESECTION,UPPER LOBE AND LOWER LOBE (Right Chest) INTERCOSTAL NERVE BLOCK (Right Chest) PLEURECTOMY (Right Chest)     Patient location during evaluation: PACU Anesthesia Type: General Level of consciousness: patient cooperative, oriented and sedated Pain management: pain level controlled Vital Signs Assessment: post-procedure vital signs reviewed and stable Respiratory status: spontaneous breathing, nonlabored ventilation and respiratory function stable Cardiovascular status: blood pressure returned to baseline and stable Postop Assessment: no apparent nausea or vomiting Anesthetic complications: no   No complications documented.  Last Vitals:  Vitals:   10/30/20 1605 10/30/20 1620  BP: 121/87 108/65  Pulse: 69 (!) 57  Resp: 12 (!) 23  Temp:  (!) 36.1 C  SpO2: 100% 100%    Last Pain:  Vitals:   10/30/20 1605  TempSrc:   PainSc: 10-Worst pain ever                 Tycho Cheramie,E. Hasana Alcorta

## 2020-10-30 NOTE — Anesthesia Preprocedure Evaluation (Addendum)
Anesthesia Evaluation  Patient identified by MRN, date of birth, ID band Patient awake    Reviewed: Allergy & Precautions, NPO status , Patient's Chart, lab work & pertinent test results  History of Anesthesia Complications Negative for: history of anesthetic complications  Airway Mallampati: I  TM Distance: >3 FB Neck ROM: Full    Dental  (+) Edentulous Upper, Edentulous Lower   Pulmonary Current Smoker and Patient abstained from smoking.,  COVID pos Recurrent spont PTX   breath sounds clear to auscultation       Cardiovascular (-) anginanegative cardio ROS   Rhythm:Regular Rate:Normal  03/2020 ECHO: EF 55-60%, no significant valvular disease   Neuro/Psych Seizures -,     GI/Hepatic negative GI ROS, (+)     substance abuse (admits to cocaine use and states that he smokes "as much as I can.")  cocaine use and marijuana use, H/o pancreatitis   Endo/Other  negative endocrine ROS  Renal/GU negative Renal ROS     Musculoskeletal   Abdominal   Peds  Hematology negative hematology ROS (+)   Anesthesia Other Findings   Reproductive/Obstetrics                            Anesthesia Physical Anesthesia Plan  ASA: III  Anesthesia Plan: General   Post-op Pain Management:    Induction: Intravenous  PONV Risk Score and Plan: 1 and Ondansetron and Dexamethasone  Airway Management Planned: Oral ETT and Double Lumen EBT  Additional Equipment: Arterial line  Intra-op Plan:   Post-operative Plan: Extubation in OR  Informed Consent: I have reviewed the patients History and Physical, chart, labs and discussed the procedure including the risks, benefits and alternatives for the proposed anesthesia with the patient or authorized representative who has indicated his/her understanding and acceptance.       Plan Discussed with: CRNA and Surgeon  Anesthesia Plan Comments:         Anesthesia Quick Evaluation

## 2020-10-30 NOTE — Discharge Instructions (Signed)

## 2020-10-30 NOTE — Transfer of Care (Signed)
Immediate Anesthesia Transfer of Care Note  Patient: Ronald Butler  Procedure(s) Performed: XI ROBOTIC ASSISTED THORASCOPY-WEDGE RESECTION,UPPER LOBE AND LOWER LOBE (Right Chest) INTERCOSTAL NERVE BLOCK (Right Chest) PLEURECTOMY (Right Chest)  Patient Location: PACU  Anesthesia Type:General  Level of Consciousness: drowsy and pateint uncooperative  Airway & Oxygen Therapy: Patient Spontanous Breathing and Patient connected to nasal cannula oxygen  Post-op Assessment: Report given to RN and Post -op Vital signs reviewed and stable  Post vital signs: Reviewed and stable  Last Vitals:  Vitals Value Taken Time  BP 111/74 10/30/20 1537  Temp 36.1 C 10/30/20 1535  Pulse 69 10/30/20 1545  Resp 21 10/30/20 1545  SpO2 84 % 10/30/20 1545  Vitals shown include unvalidated device data.  Last Pain:  Vitals:   10/30/20 0910  TempSrc:   PainSc: Asleep         Complications: No complications documented.

## 2020-10-30 NOTE — Anesthesia Procedure Notes (Signed)
Procedure Name: Intubation Date/Time: 10/30/2020 1:14 PM Performed by: Lynnell Chad, CRNA Pre-anesthesia Checklist: Patient identified, Emergency Drugs available, Suction available and Patient being monitored Patient Re-evaluated:Patient Re-evaluated prior to induction Oxygen Delivery Method: Circle System Utilized Preoxygenation: Pre-oxygenation with 100% oxygen Induction Type: IV induction Ventilation: Mask ventilation without difficulty Laryngoscope Size: Miller and 3 Grade View: Grade I Tube type: Oral Endobronchial tube: Left, Double lumen EBT and EBT position confirmed by auscultation and 39 Fr Number of attempts: 1 Airway Equipment and Method: Stylet and Oral airway Placement Confirmation: ETT inserted through vocal cords under direct vision,  positive ETCO2 and breath sounds checked- equal and bilateral Tube secured with: Tape Dental Injury: Teeth and Oropharynx as per pre-operative assessment

## 2020-10-30 NOTE — Progress Notes (Signed)
     [address]       [place] [address]             [phone]       No events  Vitals:   10/30/20 0437 10/30/20 0504  BP: 106/69 110/67  Pulse: (!) 51 (!) 49  Resp: 16 20  Temp: 97.7 F (36.5 C)   SpO2: 100% 97%   Alert NAD Pleas Koch No leak  OR today for R RATS, wedge resection, apical pleurectomy, mechanical pleurodesis  Jisel Fleet O Cadden Elizondo

## 2020-10-30 NOTE — Plan of Care (Signed)

## 2020-10-30 NOTE — Brief Op Note (Signed)
10/28/2020 - 10/30/2020  3:10 PM  PATIENT:  Ronald Butler  45 y.o. male  PRE-OPERATIVE DIAGNOSIS:  Recurrent right spontaneous pneumothorax  POST-OPERATIVE DIAGNOSIS:  Recurrent right spontaneous pneumothorax  PROCEDURE:  XI ROBOTIC ASSISTED RIGHT THORASCOPY, WEDGE RESECTION RIGHT UPPER and LOWER LOBE, RIGHT INTERCOSTAL NERVE BLOCK,  RIGHT PLEURECTOMY, and MECHANICAL PLEURODESIS  SURGEON:  Surgeon(s) and Role:    Lightfoot, Eliezer Lofts, MD - Primary  PHYSICIAN ASSISTANT: Doree Fudge PA-C  ANESTHESIA:   general  EBL:  150 mL   BLOOD ADMINISTERED:none  DRAINS: Chest tube placed in the right pleural space   LOCAL MEDICATIONS USED:  OTHER Exparel  SPECIMEN:  Source of Specimen:  Wedge RUL and RLL  DISPOSITION OF SPECIMEN:  PATHOLOGY  COUNTS CORRECT:  YES  DICTATION: .Dragon Dictation  PLAN OF CARE: Admit to inpatient   PATIENT DISPOSITION:  PACU - hemodynamically stable.   Delay start of Pharmacological VTE agent (>24hrs) due to surgical blood loss or risk of bleeding: yes

## 2020-10-30 NOTE — Progress Notes (Signed)
Patient complaining of pain to chest where incision for right chest tube is and back pain, states that Dilaudid is not helping his pain. Spoke with Dr. Arville Care, MD notified of patient's complaints and orders received to medicate with Toradol IV q 6 hours as needed. Will medicate as ordered and continue to monitor.

## 2020-10-30 NOTE — Progress Notes (Signed)
Patient's bed alarm going off, RN to patient's room at this time and patient out of bed and ambulating to bathroom. Chest tube suction disconnected for approximately 5 minutes. RN educated patient on the importance of calling for assistance and the importance of not disconnecting chest tube from suction. Patient verbalizes understanding, assisted back to bed and chest tube reconnected back to suction. Oxygen saturation 95% on room air and lung sounds to right upper and right mid lung are diminished, lung sounds to right lower lobe are clear/diminished and left lung sounds are clear/diminished. Patient complaining of usual amount of pain to right chest tube incision and back. Chest tube remains at the same marking as it was at the beginning of shift (12cm), no movement of chest tube noted. Dr. Arville Care paged and notified of patient being disconnected from chest tube suction for approximately 5 minutes, patient's complaints of back and chest pain which have been on-going since tube placed, and current lung sounds noted after suction was disconnected which are unchanged from previous assessment. No changes from current plan of care received from MD at this time, will continue to monitor patient.

## 2020-10-30 NOTE — Op Note (Signed)
      [address]       [place] Butler [id number]             [phone]        10/30/2020  Patient:  Ronald Butler Pre-Op Dx: Recurrent right spontaneous pneumothorax   Bullous emphysema Post-op Dx: Same Procedure:  - Robotic assisted right video thoracoscopy -Extensive lysis of adhesions - Wedge resection of the right upper and lower lobes - Apical pleurectomy - Mechanical pleurodesis - Intercostal nerve block  Surgeon and Role:      * Tyrell Brereton, Eliezer Lofts, MD - Primary    *D. Joycelyn Man, PA-C- assisting   Anesthesia  general EBL: Minimal  Blood Administration: None Specimen: Right upper and lower lobe wedge resections.  Pleura.  Drains: 55 F argyle chest tube in right chest Counts: correct   Indications: Ronald Butler 45 y.o. male transferred to the hospital with a spontaneous pneumothorax.  Resolved.  On placement tentatively but developed acute onset pleuritic chest pain.  He was subsequently taken to the emergency department where he was noted to have a spontaneous right-sided pneumothorax.  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  He was originally offered surgery but left AGAINST MEDICAL ADVICE the last time.  This was in 2018.  Findings: Extensive adhesions and lower lobe.  All adhesions were quite were lysed with blunt difficulty.  There is no obvious blebs but based on the imaging can most likely some extremity to the superior segment of the lower lobe with the apical segment of the upper lobe.  These were wedged using a robotic stapler.  Operative Technique: After the risks, benefits and alternatives were thoroughly discussed, the patient was brought to the operative theatre.  Anesthesia was induced, and the patient was then placed in a left lateral decubitus position and was prepped and draped in normal sterile fashion.  An appropriate surgical pause was performed, and pre-operative antibiotics were dosed  accordingly.  We began by placing our 3 robotic ports in the the 7th intercostal space targeting the hilum of the lung. The robot was then docked and all instruments were passed under direct visualization.  Multiple adhesions were lysed to fully mobilize the lung.  This had to be exposed the apical segment of the upper lobe and superior segment of the lower lobe.  The lung was freed of all pleural adhesions.  Wedge resections of the upper lobe and lower lobe were performed.  The apical parietal pleural was removed with a combination of cautery and blunt dissection.  The chest was irrigated, and an air leak test was performed.  An intercostal nerve block was performed under direct visualization.  A mechanical pleurodesis was then performed.  A 28 F chest with then placed, and we watch the remaining lobes re-expand.  The skin and soft tissue were closed with absorbable suture.    The patient tolerated the procedure without any immediate complications, and was transferred to the PACU in stable condition.  Ronald Butler

## 2020-10-31 ENCOUNTER — Inpatient Hospital Stay (HOSPITAL_COMMUNITY): Payer: Self-pay

## 2020-10-31 ENCOUNTER — Encounter (HOSPITAL_COMMUNITY): Payer: Self-pay | Admitting: Thoracic Surgery (Cardiothoracic Vascular Surgery)

## 2020-10-31 LAB — BASIC METABOLIC PANEL
Anion gap: 10 (ref 5–15)
BUN: 25 mg/dL — ABNORMAL HIGH (ref 6–20)
CO2: 20 mmol/L — ABNORMAL LOW (ref 22–32)
Calcium: 9 mg/dL (ref 8.9–10.3)
Chloride: 106 mmol/L (ref 98–111)
Creatinine, Ser: 1.05 mg/dL (ref 0.61–1.24)
GFR, Estimated: >60 mL/min (ref >60)
Glucose, Bld: 113 mg/dL — ABNORMAL HIGH (ref 70–99)
Potassium: 3.6 mmol/L (ref 3.5–5.1)
Sodium: 136 mmol/L (ref 135–145)

## 2020-10-31 LAB — CBC
HCT: 37.8 % — ABNORMAL LOW (ref 39.0–52.0)
Hemoglobin: 12.9 g/dL — ABNORMAL LOW (ref 13.0–17.0)
MCH: 30.1 pg (ref 26.0–34.0)
MCHC: 34.1 g/dL (ref 30.0–36.0)
MCV: 88.1 fL (ref 80.0–100.0)
Platelets: 337 10*3/uL (ref 150–400)
RBC: 4.29 MIL/uL (ref 4.22–5.81)
RDW: 11.9 % (ref 11.5–15.5)
WBC: 15.2 10*3/uL — ABNORMAL HIGH (ref 4.0–10.5)
nRBC: 0 % (ref 0.0–0.2)

## 2020-10-31 LAB — SEDIMENTATION RATE: Sed Rate: 30 mm/hr — ABNORMAL HIGH (ref 0–16)

## 2020-10-31 LAB — C-REACTIVE PROTEIN: CRP: 1.6 mg/dL — ABNORMAL HIGH (ref <1.0)

## 2020-10-31 LAB — D-DIMER, QUANTITATIVE: D-Dimer, Quant: 1.1 ug/mL-FEU — ABNORMAL HIGH (ref 0.00–0.50)

## 2020-10-31 LAB — FERRITIN: Ferritin: 163 ng/mL (ref 24–336)

## 2020-10-31 NOTE — Plan of Care (Signed)
  Problem: Education: Goal: Knowledge of General Education information will improve Description: Including pain rating scale, medication(s)/side effects and non-pharmacologic comfort measures Outcome: Progressing   Problem: Clinical Measurements: Goal: Ability to maintain clinical measurements within normal limits will improve Outcome: Progressing   Problem: Clinical Measurements: Goal: Will remain free from infection Outcome: Progressing   Problem: Clinical Measurements: Goal: Diagnostic test results will improve Outcome: Progressing   Problem: Clinical Measurements: Goal: Respiratory complications will improve Outcome: Progressing   Problem: Clinical Measurements: Goal: Cardiovascular complication will be avoided Outcome: Progressing   Problem: Nutrition: Goal: Adequate nutrition will be maintained Outcome: Progressing   Problem: Safety: Goal: Ability to remain free from injury will improve Outcome: Progressing   Problem: Skin Integrity: Goal: Risk for impaired skin integrity will decrease Outcome: Progressing   Problem: Activity: Goal: Risk for activity intolerance will decrease Outcome: Not Progressing   Problem: Coping: Goal: Level of anxiety will decrease Outcome: Not Progressing   Problem: Pain Managment: Goal: General experience of comfort will improve Outcome: Not Progressing

## 2020-10-31 NOTE — Progress Notes (Signed)
Per MD, RN will continue to monitor pain.

## 2020-10-31 NOTE — Progress Notes (Addendum)
      [address]       [name] [address]             [phone]       1 Day Post-Op Procedure(s) (LRB): XI ROBOTIC ASSISTED THORASCOPY-WEDGE RESECTION,UPPER LOBE AND LOWER LOBE (Right) INTERCOSTAL NERVE BLOCK (Right) PLEURECTOMY (Right)  Subjective: Patient sleeping and briefly awakened. He states he has a lot of pain at right chest tube site. He also has some nausea but no vomiting.  Objective: Vital signs in last 24 hours: Temp:  [97 F (36.1 C)-99 F (37.2 C)] 98.8 F (37.1 C) (05/03 0345) Pulse Rate:  [57-97] 59 (05/03 0345) Cardiac Rhythm: Sinus bradycardia (05/02 1900) Resp:  [12-23] 18 (05/03 0345) BP: (106-133)/(64-87) 133/82 (05/03 0345) SpO2:  [95 %-100 %] 100 % (05/03 0345)    Intake/Output from previous day: 05/02 0701 - 05/03 0700 In: 1580 [P.O.:480; I.V.:1000; IV Piggyback:100] Out: 907 [Urine:600; Blood:150; Chest Tube:157]   Physical Exam:  Cardiovascular: RRR Pulmonary: Clear to auscultation bilaterally Abdomen: Soft, non tender, bowel sounds present. Wounds: Clean and dry.  No erythema or signs of infection. Chest Tube: to suction, small, intermittent air leak with cough  Lab Results: OMV:EHMCNO Labs    10/29/20 0041 10/31/20 0057  WBC 5.1 15.2*  HGB 13.4 12.9*  HCT 39.0 37.8*  PLT 252 337   BMET:  Recent Labs    10/29/20 0041 10/31/20 0057  NA 137 136  K 3.7 3.6  CL 101 106  CO2 26 20*  GLUCOSE 107* 113*  BUN 13 25*  CREATININE 0.89 1.05  CALCIUM 9.2 9.0    PT/INR: No results for input(s): LABPROT, INR in the last 72 hours. ABG:  INR: Will add last result for INR, ABG once components are confirmed Will add last 4 CBG results once components are confirmed  Assessment/Plan:  1. CV - SR;Mild SB at times 2.  Pulmonary - On room air. Chest tube with 157 cc of output since surgery. Chest tube is to suction and there is a small, intermittent air leak. CXR this am appears to show tiny right apical  pneumothorax. Chest tube to remain to suction for now. Encourage incentive spirometer. 3. Mild expected post op blood loss anemia-H and H this am 12.9 and 37.8 4. History of seizure disorder-continue Lacosamide,  Depakote, and Zonisamide 5. Regarding pain control, has Tylenol and Toradol scheduled. He also has Dilaudid and Norco PRN.  Donielle M ZimmermanPA-C 10/31/2020,7:17 AM [phone]  Agree with above. Will transition to waterseal tomorrow.  Casimir Barcellos Keane Scrape

## 2020-10-31 NOTE — Progress Notes (Signed)
Pt is awake in bed; AOX4. Vitals stable. Sats stable on room air. Denies nausea. Pt reports intolerable back pain to right flank. Chest tube to low wall suction. Will medicate per MAR. Shift assessment complete. Dry cough noted. Immediate needs addressed. Bed in lowest position with call light within reach and 3 side rails up. Will continue to assess.

## 2020-10-31 NOTE — Progress Notes (Addendum)
PROGRESS NOTE                                                                                                                                                                                                             Patient Demographics:    Ronald Butler, is a 45 y.o. male, DOB - [birth year], GYB:[id number]  Outpatient Primary MD for the patient is Patient, No Pcp Per (Inactive)   Admit date - 10/28/2020   LOS - 3  Chief Complaint  Patient presents with  . Shortness of Breath    States that it hurts to breathe et that he has a collapsed upper right lung       Brief Narrative: Patient is a 45 y.o. male with PMHx of recurrent spontaneous pneumothorax-previously refused VATS-polysubstance abuse, seizure disorder-apprehended by law enforcement and incarcerated-subsequently developed chest pain and shortness of breath-was found to have right-sided pneumothorax-chest tube placed-patient was then transferred to [facility] for cardiothoracic surgery evaluation.  He was also found to have an incidental COVID-19 infection.   COVID-19 vaccinated status:   Significant Events: 4/30>> transfer to [facility] from [facility]-for CTVS evaluation of recurrent spontaneous pneumothorax  Significant studies: 4/30>> CT chest: Right-sided chest tube, trace pneumothorax-emphysema with prominent subpleural bulla within the right upper/lower lobes.  COVID-19 medications: None  Antibiotics: None  Microbiology data: 4/30>> COVID PCR: Positive  Procedures: 4/30 >> right-sided chest tube placement in the emergency room at [facility] 5/2>> right thoracoscopy-wedge resection  Consults: Cardiothoracic surgery  DVT prophylaxis: enoxaparin (LOVENOX) injection 40 mg Start: 10/31/20 1600 SCD's Start: 10/30/20 1642    Subjective:   Lying comfortably in bed-claims ongoing pain at the right chest operative site.   Assessment  & Plan :   Right-sided  pneumothorax-s/p VATS with wedge resection on 5/2: Cardiothoracic surgery following-defer care to cardiothoracic surgery.  COVID-19 infection: Incidental-unclear how long he has this infection-he has no symptoms-apart from observation-doubt any treatment required at this time.  Seizure disorder: Continue Depakote, Vimpat, Zonegran.  Polysubstance abuse: Check UDS-Per H&P-admitted to crack cocaine use to admitting MD-claims he only did heroin to this MD.  UDS checked-positive both narcotics and cocaine.  We will need continued counseling  Tobacco abuse: Refused transdermal nicotine   ABG: No results found for: PHART, PCO2ART, PO2ART, HCO3, TCO2, ACIDBASEDEF, O2SAT  Vent Settings: N/A    Condition -  Stable  Family Communication  : None at bedside.  Code Status :  Full Code  Diet :  Diet Order            DIET SOFT Room service appropriate? Yes; Fluid consistency: Thin  Diet effective now                  Disposition Plan  :   Status is: Inpatient  Remains inpatient appropriate because:Inpatient level of care appropriate due to severity of illness   [facility]: [facility]              Anticipated d/c is to: Prison              Patient currently is not medically stable to d/c.   Difficult to place patient No     Barriers to discharge: Chest tube in place  Antimicorbials  :    Anti-infectives (From admission, onward)   Start     Dose/Rate Route Frequency Ordered Stop   10/30/20 2130  ceFAZolin (ANCEF) IVPB 2g/100 mL premix        2 g 200 mL/hr over 30 Minutes Intravenous Every 8 hours 10/30/20 1641 10/31/20 0719      Inpatient Medications  Scheduled Meds: . acetaminophen  1,000 mg Oral Q6H   Or  . acetaminophen (TYLENOL) oral liquid 160 mg/5 mL  1,000 mg Oral Q6H  . bisacodyl  10 mg Oral Daily  . divalproex  500 mg Oral Daily  . divalproex  750 mg Oral QHS  . enoxaparin (LOVENOX) injection  40 mg Subcutaneous Q24H  . ketorolac  30 mg  Intravenous Q6H  . lacosamide  100 mg Oral BID  . senna-docusate  1 tablet Oral QHS  . sodium chloride flush  3 mL Intravenous Q12H  . zonisamide  300 mg Oral QHS   Continuous Infusions: . sodium chloride     PRN Meds:.sodium chloride, acetaminophen **OR** acetaminophen, HYDROcodone-acetaminophen, HYDROmorphone (DILAUDID) injection, ondansetron **OR** ondansetron (ZOFRAN) IV, sodium chloride flush   Time Spent in minutes  15  See all Orders from today for further details   Jeoffrey Massed M.D on 10/31/2020 at 10:53 AM  To page go to [email or web address] - use universal password  [facility] -  Office  [phone]    Objective:   Vitals:   10/31/20 0023 10/31/20 0157 10/31/20 0345 10/31/20 0808  BP: 115/75 125/86 133/82 121/80  Pulse: 62 74 (!) 59 70  Resp: 18 20 18 19   Temp: 98.6 F (37 C) 98.7 F (37.1 C) 98.8 F (37.1 C) 99.1 F (37.3 C)  TempSrc: Oral Axillary Oral Oral  SpO2: 100% 95% 100% 100%  Weight:        Wt Readings from Last 3 Encounters:  10/28/20 67.4 kg  06/19/20 74.8 kg  05/30/20 75 kg     Intake/Output Summary (Last 24 hours) at 10/31/2020 1053 Last data filed at 10/31/2020 0649 Gross per 24 hour  Intake 1580 ml  Output 907 ml  Net 673 ml     Physical Exam Gen Exam:Alert awake-not in any distress HEENT:atraumatic, normocephalic Chest: B/L clear to auscultation anteriorly CVS:S1S2 regular Abdomen:soft non tender, non distended Extremities:no edema Neurology: Non focal Skin: no rash   Data Review:    CBC Recent Labs  Lab 10/28/20 0329 10/29/20 0041 10/31/20 0057  WBC 7.5 5.1 15.2*  HGB 11.8* 13.4 12.9*  HCT 35.1* 39.0 37.8*  PLT 221 252 337  MCV 90.7 89.2 88.1  MCH 30.5  30.7 30.1  MCHC 33.6 34.4 34.1  RDW 11.9 11.8 11.9    Chemistries  Recent Labs  Lab 10/28/20 0329 10/29/20 0041 10/31/20 0057  NA 132* 137 136  K 3.9 3.7 3.6  CL 99 101 106  CO2 26 26 20*  GLUCOSE 109* 107* 113*  BUN 10 13 25*  CREATININE 0.97  0.89 1.05  CALCIUM 8.7* 9.2 9.0  MG  --  2.0  --    ------------------------------------------------------------------------------------------------------------------ No results for input(s): CHOL, HDL, LDLCALC, TRIG, CHOLHDL, LDLDIRECT in the last 72 hours.  Lab Results  Component Value Date   HGBA1C 5.5 04/27/2020   ------------------------------------------------------------------------------------------------------------------ No results for input(s): TSH, T4TOTAL, T3FREE, THYROIDAB in the last 72 hours.  Invalid input(s): FREET3 ------------------------------------------------------------------------------------------------------------------ Recent Labs    10/28/20 1132 10/29/20 0041 10/30/20 0044 10/31/20 0057  FOLATE 7.5  --   --   --   FERRITIN  --    < > 179 163   < > = values in this interval not displayed.    Coagulation profile No results for input(s): INR, PROTIME in the last 168 hours.  Recent Labs    10/30/20 0044 10/31/20 0057  DDIMER 0.94* 1.10*    Cardiac Enzymes No results for input(s): CKMB, TROPONINI, MYOGLOBIN in the last 168 hours.  Invalid input(s): CK ------------------------------------------------------------------------------------------------------------------ No results found for: BNP  Micro Results Recent Results (from the past 240 hour(s))  Resp Panel by RT-PCR (Flu A&B, Covid) Nasopharyngeal Swab     Status: Abnormal   Collection Time: 10/28/20  3:14 AM   Specimen: Nasopharyngeal Swab; Nasopharyngeal(NP) swabs in vial transport medium  Result Value Ref Range Status   SARS Coronavirus 2 by RT PCR POSITIVE (A) NEGATIVE Final    Comment: RESULT CALLED TO, READ BACK BY AND VERIFIED WITH: G PRUITT 4/30 @0451  BY S. BEARD (NOTE) SARS-CoV-2 target nucleic acids are DETECTED.  The SARS-CoV-2 RNA is generally detectable in upper respiratory specimens during the acute phase of infection. Positive results are indicative of the presence of  the identified virus, but do not rule out bacterial infection or co-infection with other pathogens not detected by the test. Clinical correlation with patient history and other diagnostic information is necessary to determine patient infection status. The expected result is Negative.  Fact Sheet for Patients: BloggerCourse.comhttps://www.fda.gov/media/152166/download  Fact Sheet for Healthcare Providers: SeriousBroker.ithttps://www.fda.gov/media/152162/download  This test is not yet approved or cleared by the [place] FDA and  has been authorized for detection and/or diagnosis of SARS-CoV-2 by FDA under an Emergency Use Authorization (EUA).  This EUA will remain in effect (meaning this test can be u sed) for the duration of  the COVID-19 declaration under Section 564(b)(1) of the Act, 21 U.S.C. section 360bbb-3(b)(1), unless the authorization is terminated or revoked sooner.     Influenza A by PCR NEGATIVE NEGATIVE Final   Influenza B by PCR NEGATIVE NEGATIVE Final    Comment: (NOTE) The Xpert Xpress SARS-CoV-2/FLU/RSV plus assay is intended as an aid in the diagnosis of influenza from Nasopharyngeal swab specimens and should not be used as a sole basis for treatment. Nasal washings and aspirates are unacceptable for Xpert Xpress SARS-CoV-2/FLU/RSV testing.  Fact Sheet for Patients: BloggerCourse.comhttps://www.fda.gov/media/152166/download  Fact Sheet for Healthcare Providers: SeriousBroker.ithttps://www.fda.gov/media/152162/download  This test is not yet approved or cleared by the [place] FDA and has been authorized for detection and/or diagnosis of SARS-CoV-2 by FDA under an Emergency Use Authorization (EUA). This EUA will remain in effect (meaning this test can be used) for  the duration of the COVID-19 declaration under Section 564(b)(1) of the Act, 21 U.S.C. section 360bbb-3(b)(1), unless the authorization is terminated or revoked.  Performed at [facility], [address] [place], [place] [address]     Radiology  Reports CT CHEST WO CONTRAST  Result Date: 10/28/2020 CLINICAL DATA:  Pneumothorax, COVID-19 positive, chest tube EXAM: CT CHEST WITHOUT CONTRAST TECHNIQUE: Multidetector CT imaging of the chest was performed following the standard protocol without IV contrast. COMPARISON:  10/28/2020 FINDINGS: Cardiovascular: Unenhanced imaging of the heart and great vessels demonstrates no pericardial effusion. Normal caliber of the thoracic aorta. Mediastinum/Nodes: No enlarged mediastinal or axillary lymph nodes. Thyroid gland, trachea, and esophagus demonstrate no significant findings. Lungs/Pleura: There is an indwelling right-sided chest tube via a right anterolateral approach. Trace residual right basilar pneumothorax is identified, volume estimated far less than 5%. Emphysematous changes are seen, upper lobe predominant, with numerous subpleural bullous seen within the right lower and right upper lobes. Dependent consolidation within the right lower lobe consistent with atelectasis. Trace right pleural effusion. Central airways are patent. Upper Abdomen: No acute abnormality. Musculoskeletal: No acute or destructive bony lesions. Reconstructed images demonstrate no additional findings. IMPRESSION: 1. Right-sided chest tube as above, with trace residual right basilar pneumothorax volume estimated less than 5%. 2. Emphysema, with prominent subpleural bulla within the right upper and right lower lobes. 3. Minimal dependent lower lobe atelectasis, with trace right pleural effusion. Electronically Signed   By: Sharlet Salina M.D.   On: 10/28/2020 16:08   DG CHEST PORT 1 VIEW  Result Date: 10/31/2020 CLINICAL DATA:  Pneumothorax, chest tube EXAM: PORTABLE CHEST 1 VIEW COMPARISON:  Portable exam 0610 hours compared to 10/30/2020 FINDINGS: RIGHT thoracostomy tube stable. Normal heart size, mediastinal contours, and pulmonary vascularity. Persistent small RIGHT pneumothorax with adjacent RIGHT apex staple line. Remaining lungs  clear. No pleural effusion or acute osseous findings. IMPRESSION: Persistent small RIGHT apex pneumothorax. Electronically Signed   By: Ulyses Southward M.D.   On: 10/31/2020 07:57   DG Chest Port 1 View  Result Date: 10/30/2020 CLINICAL DATA:  Postop lung surgery. EXAM: PORTABLE CHEST 1 VIEW COMPARISON:  10/28/2020 FINDINGS: There is a right-sided chest tube in place. No significant right-sided pneumothorax. The heart size is stable. There is no acute osseous abnormality. IMPRESSION: Chest tube in place without evidence for significant right-sided pneumothorax. Electronically Signed   By: Katherine Mantle M.D.   On: 10/30/2020 18:54   DG Chest Port 1 View  Result Date: 10/28/2020 CLINICAL DATA:  Shortness of breath with history of spontaneous pneumothorax. EXAM: PORTABLE CHEST 1 VIEW COMPARISON:  June 19, 2020 FINDINGS: A small to moderate size pneumothorax is seen along the lateral aspect of the mid and lower right hemithorax. Mild atelectasis is seen within the medial aspect of the right lung base. There is no evidence of a pleural effusion. The heart size and mediastinal contours are within normal limits. Radiopaque surgical clips are seen overlying the right upper quadrant. The visualized skeletal structures are unremarkable. IMPRESSION: Small to moderate sized right-sided pneumothorax. Electronically Signed   By: Aram Candela M.D.   On: 10/28/2020 02:49   DG Chest Port 1V same Day  Result Date: 10/28/2020 CLINICAL DATA:  Chest tube insertion, COVID-19 positive EXAM: PORTABLE CHEST 1 VIEW COMPARISON:  Radiograph 10/28/2020 FINDINGS: Patient is post placement of a right chest tube which terminates in the mid right lung towards midline. Small residual lateral right pneumothorax and some questionable trace right effusion. No left pneumothorax or  pleural fluid. Some additional coarsened interstitial opacities are present which could reflect atelectasis or atypical infection in the setting of COVID  19. Stable cardiomediastinal contours. Telemetry leads overlie the chest. IMPRESSION: Interval placement of a right chest tube with decrease in size of the previously seen right pneumothorax Small residual right pneumothorax and trace right pleural fluid remains. Some coarsened interstitial opacities, possibly atelectasis versus atypical infection in the setting of COVID-19. Electronically Signed   By: Kreg Shropshire M.D.   On: 10/28/2020 06:50

## 2020-10-31 NOTE — Progress Notes (Addendum)
HOSPITAL MEDICINE OVERNIGHT EVENT NOTE    Notified by nursing the patient is complaining of some right-sided abdominal pain.  Per my discussion with nursing, there is no evidence of a chest tube leak.  Abdomen is soft and nondistended with some tenderness in the right upper quadrant.  Patient is moving his bowels without difficulty and is exhibiting no evidence of vomiting.  Patient's complaints of pain are likely referred in nature.  Doubt intra-abdominal pathology at this point.  Advised nursing to continue to monitor closely and notify me if it worsens.  Ronald Elk  MD [facility]   ADDENDUM (5/4 4:30AM)  Nursing has brought to our attention that patient is receiving 1 g of Tylenol every 6 hours in addition to an additional 325 mg every 4 hours with Norco for moderate pain.  Will switch patient from Norco to oxycodone for moderate pain to limit acetaminophen usage to 4 g daily.  [name]

## 2020-10-31 NOTE — Progress Notes (Deleted)
Canister total is 164

## 2020-11-01 ENCOUNTER — Inpatient Hospital Stay (HOSPITAL_COMMUNITY): Payer: Self-pay

## 2020-11-01 LAB — C-REACTIVE PROTEIN: CRP: 4.6 mg/dL — ABNORMAL HIGH (ref <1.0)

## 2020-11-01 LAB — CBC
HCT: 37.1 % — ABNORMAL LOW (ref 39.0–52.0)
Hemoglobin: 12.7 g/dL — ABNORMAL LOW (ref 13.0–17.0)
MCH: 30.5 pg (ref 26.0–34.0)
MCHC: 34.2 g/dL (ref 30.0–36.0)
MCV: 89.2 fL (ref 80.0–100.0)
Platelets: 287 10*3/uL (ref 150–400)
RBC: 4.16 MIL/uL — ABNORMAL LOW (ref 4.22–5.81)
RDW: 12 % (ref 11.5–15.5)
WBC: 12.7 10*3/uL — ABNORMAL HIGH (ref 4.0–10.5)
nRBC: 0 % (ref 0.0–0.2)

## 2020-11-01 LAB — FERRITIN: Ferritin: 185 ng/mL (ref 24–336)

## 2020-11-01 LAB — COMPREHENSIVE METABOLIC PANEL
ALT: 8 U/L (ref 0–44)
AST: 14 U/L — ABNORMAL LOW (ref 15–41)
Albumin: 3 g/dL — ABNORMAL LOW (ref 3.5–5.0)
Alkaline Phosphatase: 52 U/L (ref 38–126)
Anion gap: 8 (ref 5–15)
BUN: 22 mg/dL — ABNORMAL HIGH (ref 6–20)
CO2: 23 mmol/L (ref 22–32)
Calcium: 9 mg/dL (ref 8.9–10.3)
Chloride: 107 mmol/L (ref 98–111)
Creatinine, Ser: 0.98 mg/dL (ref 0.61–1.24)
GFR, Estimated: >60 mL/min (ref >60)
Glucose, Bld: 116 mg/dL — ABNORMAL HIGH (ref 70–99)
Potassium: 3.5 mmol/L (ref 3.5–5.1)
Sodium: 138 mmol/L (ref 135–145)
Total Bilirubin: 0.6 mg/dL (ref 0.3–1.2)
Total Protein: 6.2 g/dL — ABNORMAL LOW (ref 6.5–8.1)

## 2020-11-01 LAB — D-DIMER, QUANTITATIVE: D-Dimer, Quant: 1.01 ug/mL-FEU — ABNORMAL HIGH (ref 0.00–0.50)

## 2020-11-01 LAB — SEDIMENTATION RATE: Sed Rate: 35 mm/hr — ABNORMAL HIGH (ref 0–16)

## 2020-11-01 MED ORDER — OXYCODONE HCL 5 MG PO TABS
10.0000 mg | ORAL_TABLET | ORAL | Status: DC | PRN
  Administered 2020-11-01 – 2020-11-12 (×31): 10 mg via ORAL
  Filled 2020-11-01 (×31): qty 2

## 2020-11-01 MED ORDER — HYDROMORPHONE HCL 1 MG/ML IJ SOLN
1.0000 mg | INTRAMUSCULAR | Status: DC | PRN
  Administered 2020-11-01: 2 mg via INTRAVENOUS
  Administered 2020-11-01: 1 mg via INTRAVENOUS
  Administered 2020-11-01 – 2020-11-04 (×28): 2 mg via INTRAVENOUS
  Administered 2020-11-04: 1 mg via INTRAVENOUS
  Administered 2020-11-04 (×2): 2 mg via INTRAVENOUS
  Administered 2020-11-04: 1 mg via INTRAVENOUS
  Administered 2020-11-04 – 2020-11-05 (×6): 2 mg via INTRAVENOUS
  Administered 2020-11-05: 1 mg via INTRAVENOUS
  Administered 2020-11-05 (×2): 2 mg via INTRAVENOUS
  Administered 2020-11-05: 1 mg via INTRAVENOUS
  Administered 2020-11-05: 2 mg via INTRAVENOUS
  Administered 2020-11-05: 1 mg via INTRAVENOUS
  Administered 2020-11-05 – 2020-11-06 (×12): 2 mg via INTRAVENOUS
  Administered 2020-11-07: 1 mg via INTRAVENOUS
  Administered 2020-11-07 (×2): 2 mg via INTRAVENOUS
  Administered 2020-11-07: 1 mg via INTRAVENOUS
  Administered 2020-11-07: 2 mg via INTRAVENOUS
  Administered 2020-11-07 (×2): 1 mg via INTRAVENOUS
  Filled 2020-11-01: qty 2
  Filled 2020-11-01: qty 1
  Filled 2020-11-01 (×2): qty 2
  Filled 2020-11-01: qty 1
  Filled 2020-11-01: qty 2
  Filled 2020-11-01: qty 1
  Filled 2020-11-01 (×5): qty 2
  Filled 2020-11-01: qty 1
  Filled 2020-11-01 (×2): qty 2
  Filled 2020-11-01: qty 1
  Filled 2020-11-01 (×7): qty 2
  Filled 2020-11-01: qty 1
  Filled 2020-11-01 (×7): qty 2
  Filled 2020-11-01: qty 1
  Filled 2020-11-01 (×9): qty 2
  Filled 2020-11-01: qty 1
  Filled 2020-11-01 (×12): qty 2
  Filled 2020-11-01 (×2): qty 1
  Filled 2020-11-01 (×9): qty 2

## 2020-11-01 MED ORDER — ACETAMINOPHEN 500 MG PO TABS
1000.0000 mg | ORAL_TABLET | Freq: Three times a day (TID) | ORAL | Status: DC
  Administered 2020-11-01 – 2020-12-03 (×85): 1000 mg via ORAL
  Filled 2020-11-01 (×88): qty 2

## 2020-11-01 MED ORDER — ACETAMINOPHEN 160 MG/5ML PO SOLN: 1000.0000 mg | Freq: Three times a day (TID) | ORAL | Status: DC

## 2020-11-01 MED ORDER — POTASSIUM CHLORIDE CRYS ER 20 MEQ PO TBCR
40.0000 meq | EXTENDED_RELEASE_TABLET | Freq: Once | ORAL | Status: AC
  Administered 2020-11-01: 40 meq via ORAL
  Filled 2020-11-01: qty 2

## 2020-11-01 MED ORDER — OXYCODONE HCL 5 MG PO TABS
5.0000 mg | ORAL_TABLET | ORAL | Status: DC | PRN
  Administered 2020-11-01: 5 mg via ORAL
  Filled 2020-11-01: qty 1

## 2020-11-01 MED ORDER — POLYETHYLENE GLYCOL 3350 17 G PO PACK
17.0000 g | PACK | Freq: Every day | ORAL | Status: DC
  Filled 2020-11-01 (×7): qty 1

## 2020-11-01 NOTE — Plan of Care (Signed)

## 2020-11-01 NOTE — Plan of Care (Signed)

## 2020-11-01 NOTE — Progress Notes (Signed)
Patient is refusing to keep telemetry on. MD made aware.

## 2020-11-01 NOTE — TOC Initial Note (Signed)
Transition of Care [facility]) - Initial/Assessment Note    Patient Details  Name: Ronald Butler MRN: [id number] Date of Birth: [birth year]  [facility] CM/SW Contact:    Lawerance Sabal, RN Phone Number: 11/01/2020, 12:21 PM  Clinical Narrative:           Recurrent PTX, VATS 5/2 In police custody after fleeing/ has 9 felony warrents. Officer in hallway. States patient will go to [facility] at DC.  [facility] RN station [phone]          Expected Discharge Plan: [facility] Barriers to Discharge: Continued Medical Work up   Patient Goals and CMS Choice        Expected Discharge Plan and Services Expected Discharge Plan: Tax inspector                                              Prior Living Arrangements/Services                       Activities of Daily Living Home Assistive Devices/Equipment: None ADL Screening (condition at time of admission) Patient's cognitive ability adequate to safely complete daily activities?: Yes Is the patient deaf or have difficulty hearing?: No Does the patient have difficulty seeing, even when wearing glasses/contacts?: No Does the patient have difficulty concentrating, remembering, or making decisions?: No Patient able to express need for assistance with ADLs?: Yes Does the patient have difficulty dressing or bathing?: No Independently performs ADLs?: Yes (appropriate for developmental age) Does the patient have difficulty walking or climbing stairs?: No Weakness of Legs: None Weakness of Arms/Hands: None  Permission Sought/Granted                  Emotional Assessment              Admission diagnosis:  Encounter for chest tube placement [Z46.82] Recurrent spontaneous pneumothorax [J93.83] Pneumothorax [J93.9] Patient Active Problem List   Diagnosis Date Noted  . Pneumothorax 10/28/2020  . Witnessed seizure-like activity (HCC) 04/26/2020  .  Recurrent spontaneous pneumothorax 04/24/2017   PCP:  Patient, No Pcp Per (Inactive) Pharmacy:   [facility] - [place], [place] - [address] [place] [address] Phone: [phone] Fax: [phone]  [id number] [place], [place] - [address] [place] [address] Phone: [phone] Fax: [phone]     Social Determinants of Health (SDOH) Interventions    Readmission Risk Interventions No flowsheet data found.

## 2020-11-01 NOTE — Progress Notes (Signed)
Placed chest tube to water seal per order. 

## 2020-11-01 NOTE — Progress Notes (Addendum)
      [address]       Ronald Butler [address]             [phone]       2 Days Post-Op Procedure(s) (LRB): XI ROBOTIC ASSISTED THORASCOPY-WEDGE RESECTION,UPPER LOBE AND LOWER LOBE (Right) INTERCOSTAL NERVE BLOCK (Right) PLEURECTOMY (Right)  Subjective: Patient states he has a lot of pain on right side of chest (surgical side). He is hungry and asking for pain medication this am.  Objective: Vital signs in last 24 hours: Temp:  [97.8 F (36.6 C)-99.1 F (37.3 C)] 98.1 F (36.7 C) (05/04 0413) Pulse Rate:  [50-70] 53 (05/04 0413) Cardiac Rhythm: Sinus bradycardia (05/04 0400) Resp:  [14-19] 14 (05/04 0413) BP: (114-127)/(68-85) 117/85 (05/04 0413) SpO2:  [98 %-100 %] 98 % (05/04 0413)    Intake/Output from previous day: 05/03 0701 - 05/04 0700 In: 480 [P.O.:480] Out: 329 [Urine:300; Chest Tube:29]   Physical Exam:  Cardiovascular: RRR Pulmonary: Clear to auscultation bilaterally Abdomen: Soft, non tender, bowel sounds present. Wounds: Clean and dry.  No erythema or signs of infection. Chest Tube: to suction, NO air leak   Lab Results: CBC: Recent Labs    10/31/20 0057 11/01/20 0026  WBC 15.2* 12.7*  HGB 12.9* 12.7*  HCT 37.8* 37.1*  PLT 337 287   BMET:  Recent Labs    10/31/20 0057 11/01/20 0026  NA 136 138  K 3.6 3.5  CL 106 107  CO2 20* 23  GLUCOSE 113* 116*  BUN 25* 22*  CREATININE 1.05 0.98  CALCIUM 9.0 9.0    PT/INR: No results for input(s): LABPROT, INR in the last 72 hours. ABG:  INR: Will add last result for INR, ABG once components are confirmed Will add last 4 CBG results once components are confirmed  Assessment/Plan:  1. CV - SR;Mild SB at times 2.  Pulmonary - On room air. Chest tube with 29 cc of output last 24 hours. Chest tube is to suction and there is NO t air leak. Await this am's CXR result. Will  place chest tube to be placed to water seal. Encourage incentive spirometer. 3. Mild expected post op  blood loss anemia-H and H this am stable at 12.7 and 37.1 4. History of seizure disorder-continue Lacosamide,  Depakote, and Zonisamide 5. Regarding pain control, has Tylenol and Toradol scheduled. He also has Dilaudid and Norco PRN.  Ronald Butler 11/01/2020,7:03 AM [phone]  Agree with above Will transition to WS today  Ronald Butler

## 2020-11-01 NOTE — Progress Notes (Signed)
PROGRESS NOTE                                                                                                                                                                                                             Patient Demographics:    Ronald Butler, is a 45 y.o. male, DOB - 23-Dec-1975, W[facility]:[id number]  Outpatient Primary MD for the patient is Patient, No Pcp Per (Inactive)   Admit date - 10/28/2020   LOS - 4  Chief Complaint  Patient presents with  . Shortness of Breath    States that it hurts to breathe et that he has a collapsed upper right lung       Brief Narrative: Patient is a 45 y.o. male with PMHx of recurrent spontaneous pneumothorax-previously refused VATS-polysubstance abuse, seizure disorder-apprehended by law enforcement and incarcerated-subsequently developed chest pain and shortness of breath-was found to have right-sided pneumothorax-chest tube placed-patient was then transferred to [facility] for cardiothoracic surgery evaluation.  He was also found to have an incidental COVID-19 infection.   COVID-19 vaccinated status:   Significant Events: 4/30>> transfer to [facility] from [facility]-for CTVS evaluation of recurrent spontaneous pneumothorax  Significant studies: 4/30>> CT chest: Right-sided chest tube, trace pneumothorax-emphysema with prominent subpleural bulla within the right upper/lower lobes.  COVID-19 medications: None  Antibiotics: None  Microbiology data: 4/30>> COVID PCR: Positive  Procedures: 4/30 >> right-sided chest tube placement in the emergency room at [place] 5/2>> right thoracoscopy-wedge resection  Consults: Cardiothoracic surgery  DVT prophylaxis: enoxaparin (LOVENOX) injection 40 mg Start: 10/31/20 1600 SCD's Start: 10/30/20 1642    Subjective:   Continues to complain of pain at the chest tube site.   Assessment  & Plan :   Right-sided pneumothorax-s/p VATS with  wedge resection on 5/2: Cardiothoracic surgery following-defer care to cardiothoracic surgery.  COVID-19 infection: Incidental-unclear how long he has this infection-he has no symptoms-apart from observation-doubt any treatment required at this time.  Seizure disorder: Continue Depakote, Vimpat, Zonegran.  Polysubstance abuse: Per H&P-admitted to crack cocaine use to admitting MD-claims he only did heroin to this MD.  UDS checked-positive both narcotics and cocaine.  We will need continued counseling  Tobacco abuse: Refused transdermal nicotine   ABG: No results found for: PHART, PCO2ART, PO2ART, HCO3, TCO2, ACIDBASEDEF, O2SAT  Vent Settings: N/A    Condition - Stable  Family Communication  : None at bedside.  Code Status :  Full Code  Diet :  Diet Order            DIET SOFT Room service appropriate? Yes; Fluid consistency: Thin  Diet effective now                  Disposition Plan  :   Status is: Inpatient  Remains inpatient appropriate because:Inpatient level of care appropriate due to severity of illness   [facility]: [facility]              Anticipated d/c is to: Prison              Patient currently is not medically stable to d/c.   Difficult to place patient No     Barriers to discharge: Chest tube in place  Antimicorbials  :    Anti-infectives (From admission, onward)   Start     Dose/Rate Route Frequency Ordered Stop   10/30/20 2130  ceFAZolin (ANCEF) IVPB 2g/100 mL premix        2 g 200 mL/hr over 30 Minutes Intravenous Every 8 hours 10/30/20 1641 10/31/20 0719      Inpatient Medications  Scheduled Meds: . acetaminophen  1,000 mg Oral Q8H   Or  . acetaminophen (TYLENOL) oral liquid 160 mg/5 mL  1,000 mg Oral Q8H  . bisacodyl  10 mg Oral Daily  . divalproex  500 mg Oral Daily  . divalproex  750 mg Oral QHS  . enoxaparin (LOVENOX) injection  40 mg Subcutaneous Q24H  . ketorolac  30 mg Intravenous Q6H  . lacosamide  100 mg Oral  BID  . polyethylene glycol  17 g Oral Daily  . senna-docusate  1 tablet Oral QHS  . sodium chloride flush  3 mL Intravenous Q12H  . zonisamide  300 mg Oral QHS   Continuous Infusions: . sodium chloride     PRN Meds:.sodium chloride, HYDROmorphone (DILAUDID) injection, ondansetron **OR** ondansetron (ZOFRAN) IV, oxyCODONE, sodium chloride flush   Time Spent in minutes  15  See all Orders from today for further details   Jeoffrey MassedShanker Laronda Lisby M.D on 11/01/2020 at 10:22 AM  To page go to [email or web address] - use universal password  [facility] -  Office  [phone]    Objective:   Vitals:   10/31/20 2151 11/01/20 0000 11/01/20 0413 11/01/20 0733  BP: 115/68 114/71 117/85 135/81  Pulse: (!) 52 (!) 50 (!) 53 (!) 50  Resp: 18 15 14 18   Temp: 97.8 F (36.6 C) 98 F (36.7 C) 98.1 F (36.7 C) 98.2 F (36.8 C)  TempSrc: Oral Axillary Axillary Oral  SpO2: 100% 100% 98%   Weight:        Wt Readings from Last 3 Encounters:  10/28/20 67.4 kg  06/19/20 74.8 kg  05/30/20 75 kg     Intake/Output Summary (Last 24 hours) at 11/01/2020 1022 Last data filed at 11/01/2020 0735 Gross per 24 hour  Intake 240 ml  Output 629 ml  Net -389 ml     Physical Exam Gen Exam:Alert awake-not in any distress HEENT:atraumatic, normocephalic Chest: B/L clear to auscultation anteriorly CVS:S1S2 regular Abdomen:soft non tender, non distended Extremities:no edema Neurology: Non focal Skin: no rash   Data Review:    CBC Recent Labs  Lab 10/28/20 0329 10/29/20 0041 10/31/20 0057 11/01/20 0026  WBC 7.5 5.1 15.2* 12.7*  HGB 11.8* 13.4 12.9* 12.7*  HCT 35.1* 39.0 37.8* 37.1*  PLT 221 252 337 287  MCV 90.7 89.2 88.1 89.2  MCH 30.5 30.7 30.1 30.5  MCHC 33.6 34.4 34.1 34.2  RDW 11.9 11.8 11.9 12.0    Chemistries  Recent Labs  Lab 10/28/20 0329 10/29/20 0041 10/31/20 0057 11/01/20 0026  NA 132* 137 136 138  K 3.9 3.7 3.6 3.5  CL 99 101 106 107  CO2 26 26 20* 23  GLUCOSE 109*  107* 113* 116*  BUN 10 13 25* 22*  CREATININE 0.97 0.89 1.05 0.98  CALCIUM 8.7* 9.2 9.0 9.0  MG  --  2.0  --   --   AST  --   --   --  14*  ALT  --   --   --  8  ALKPHOS  --   --   --  52  BILITOT  --   --   --  0.6   ------------------------------------------------------------------------------------------------------------------ No results for input(s): CHOL, HDL, LDLCALC, TRIG, CHOLHDL, LDLDIRECT in the last 72 hours.  Lab Results  Component Value Date   HGBA1C 5.5 04/27/2020   ------------------------------------------------------------------------------------------------------------------ No results for input(s): TSH, T4TOTAL, T3FREE, THYROIDAB in the last 72 hours.  Invalid input(s): FREET3 ------------------------------------------------------------------------------------------------------------------ Recent Labs    10/31/20 0057 11/01/20 0026  FERRITIN 163 185    Coagulation profile No results for input(s): INR, PROTIME in the last 168 hours.  Recent Labs    10/31/20 0057 11/01/20 0026  DDIMER 1.10* 1.01*    Cardiac Enzymes No results for input(s): CKMB, TROPONINI, MYOGLOBIN in the last 168 hours.  Invalid input(s): CK ------------------------------------------------------------------------------------------------------------------ No results found for: BNP  Micro Results Recent Results (from the past 240 hour(s))  Resp Panel by RT-PCR (Flu A&B, Covid) Nasopharyngeal Swab     Status: Abnormal   Collection Time: 10/28/20  3:14 AM   Specimen: Nasopharyngeal Swab; Nasopharyngeal(NP) swabs in vial transport medium  Result Value Ref Range Status   SARS Coronavirus 2 by RT PCR POSITIVE (A) NEGATIVE Final    Comment: RESULT CALLED TO, READ BACK BY AND VERIFIED WITH: G PRUITT 4/30 @0451  BY S. BEARD (NOTE) SARS-CoV-2 target nucleic acids are DETECTED.  The SARS-CoV-2 RNA is generally detectable in upper respiratory specimens during the acute phase of  infection. Positive results are indicative of the presence of the identified virus, but do not rule out bacterial infection or co-infection with other pathogens not detected by the test. Clinical correlation with patient history and other diagnostic information is necessary to determine patient infection status. The expected result is Negative.  Fact Sheet for Patients:  Fact Sheet for Healthcare Providers: BloggerCourse.com  This test is not yet approved or cleared by the [employer] FDA and  has been authorized for detection and/or diagnosis of SARS-CoV-2 by FDA under an Emergency Use Authorization (EUA).  This EUA will remain in effect (meaning this test can be u sed) for the duration of  the COVID-19 declaration under Section 564(b)(1) of the Act, 21 U.S.C. section 360bbb-3(b)(1), unless the authorization is terminated or revoked sooner.     Influenza A by PCR NEGATIVE NEGATIVE Final   Influenza B by PCR NEGATIVE NEGATIVE Final    Comment: (NOTE) The Xpert Xpress SARS-CoV-2/FLU/RSV plus assay is intended as an aid in the diagnosis of influenza from Nasopharyngeal swab specimens and should not be used as a sole basis for treatment. Nasal washings and aspirates are unacceptable for Xpert Xpress SARS-CoV-2/FLU/RSV testing.  Fact Sheet for Patients: [place]  Fact Sheet for Healthcare Providers: BloggerCourse.com  This test is not yet approved or cleared by the [place] and has been authorized for detection and/or diagnosis of SARS-CoV-2 by FDA under an Emergency Use Authorization (EUA). This EUA will remain in effect (meaning this test can be used) for the duration of the COVID-19 declaration under Section 564(b)(1) of the Act, 21 U.S.C. section 360bbb-3(b)(1), unless the authorization is terminated or revoked.  Performed at [facility], [address], [place], [place] [address]     Radiology Reports CT CHEST WO CONTRAST  Result Date: 10/28/2020 CLINICAL DATA:  Pneumothorax, COVID-19 positive, chest tube EXAM: CT CHEST WITHOUT CONTRAST TECHNIQUE: Multidetector CT imaging of the chest was performed following the standard protocol without IV contrast. COMPARISON:  10/28/2020 FINDINGS: Cardiovascular: Unenhanced imaging of the heart and great vessels demonstrates no pericardial effusion. Normal caliber of the thoracic aorta. Mediastinum/Nodes: No enlarged mediastinal or axillary lymph nodes. Thyroid gland, trachea, and esophagus demonstrate no significant findings. Lungs/Pleura: There is an indwelling right-sided chest tube via a right anterolateral approach. Trace residual right basilar pneumothorax is identified, volume estimated far less than 5%. Emphysematous changes are seen, upper lobe predominant, with numerous subpleural bullous seen within the right lower and right upper lobes. Dependent consolidation within the right lower lobe consistent with atelectasis. Trace right pleural effusion. Central airways are patent. Upper Abdomen: No acute abnormality. Musculoskeletal: No acute or destructive bony lesions. Reconstructed images demonstrate no additional findings. IMPRESSION: 1. Right-sided chest tube as above, with trace residual right basilar pneumothorax volume estimated less than 5%. 2. Emphysema, with prominent subpleural bulla within the right upper and right lower lobes. 3. Minimal dependent lower lobe atelectasis, with trace right pleural effusion. Electronically Signed   By: Sharlet Salina M.D.   On: 10/28/2020 16:08   DG CHEST PORT 1 VIEW  Result Date: 10/31/2020 CLINICAL DATA:  Pneumothorax, chest tube EXAM: PORTABLE CHEST 1 VIEW COMPARISON:  Portable exam 0610 hours compared to 10/30/2020 FINDINGS: RIGHT thoracostomy tube stable. Normal heart size, mediastinal contours, and pulmonary vascularity. Persistent small RIGHT  pneumothorax with adjacent RIGHT apex staple line. Remaining lungs clear. No pleural effusion or acute osseous findings. IMPRESSION: Persistent small RIGHT apex pneumothorax. Electronically Signed   By: Ulyses Southward M.D.   On: 10/31/2020 07:57   DG Chest Port 1 View  Result Date: 10/30/2020 CLINICAL DATA:  Postop lung surgery. EXAM: PORTABLE CHEST 1 VIEW COMPARISON:  10/28/2020 FINDINGS: There is a right-sided chest tube in place. No significant right-sided pneumothorax. The heart size is stable. There is no acute osseous abnormality. IMPRESSION: Chest tube in place without evidence for significant right-sided pneumothorax. Electronically Signed   By: Katherine Mantle M.D.   On: 10/30/2020 18:54   DG Chest Port 1 View  Result Date: 10/28/2020 CLINICAL DATA:  Shortness of breath with history of spontaneous pneumothorax. EXAM: PORTABLE CHEST 1 VIEW COMPARISON:  June 19, 2020 FINDINGS: A small to moderate size pneumothorax is seen along the lateral aspect of the mid and lower right hemithorax. Mild atelectasis is seen within the medial aspect of the right lung base. There is no evidence of a pleural effusion. The heart size and mediastinal contours are within normal limits. Radiopaque surgical clips are seen overlying the right upper quadrant. The visualized skeletal structures are unremarkable. IMPRESSION: Small to moderate sized right-sided pneumothorax. Electronically Signed   By: Aram Candela M.D.   On: 10/28/2020 02:49   DG Chest Port 1V same Ronald  Result Date: 10/28/2020 CLINICAL DATA:  Chest tube insertion, COVID-19  positive EXAM: PORTABLE CHEST 1 VIEW COMPARISON:  Radiograph 10/28/2020 FINDINGS: Patient is post placement of a right chest tube which terminates in the mid right lung towards midline. Small residual lateral right pneumothorax and some questionable trace right effusion. No left pneumothorax or pleural fluid. Some additional coarsened interstitial opacities are present which could  reflect atelectasis or atypical infection in the setting of COVID 19. Stable cardiomediastinal contours. Telemetry leads overlie the chest. IMPRESSION: Interval placement of a right chest tube with decrease in size of the previously seen right pneumothorax Small residual right pneumothorax and trace right pleural fluid remains. Some coarsened interstitial opacities, possibly atelectasis versus atypical infection in the setting of COVID-19. Electronically Signed   By: Kreg Shropshire M.D.   On: 10/28/2020 06:50

## 2020-11-02 ENCOUNTER — Inpatient Hospital Stay (HOSPITAL_COMMUNITY): Payer: Self-pay

## 2020-11-02 LAB — SURGICAL PATHOLOGY

## 2020-11-02 LAB — D-DIMER, QUANTITATIVE: D-Dimer, Quant: 3.15 ug/mL-FEU — ABNORMAL HIGH (ref 0.00–0.50)

## 2020-11-02 NOTE — Progress Notes (Signed)
PROGRESS NOTE                                                                                                                                                                                                             Patient Demographics:    Ronald Butler, is a 45 y.o. male, DOB - [birth year], CZY:[id number]  Outpatient Primary MD for the patient is Patient, No Pcp Per (Inactive)   Admit date - 10/28/2020   LOS - 5  Chief Complaint  Patient presents with  . Shortness of Breath    States that it hurts to breathe et that he has a collapsed upper right lung       Brief Narrative: Patient is a 45 y.o. male with PMHx of recurrent spontaneous pneumothorax-previously refused VATS-polysubstance abuse, seizure disorder-apprehended by law enforcement and incarcerated-subsequently developed chest pain and shortness of breath-was found to have right-sided pneumothorax-chest tube placed-patient was then transferred to [facility] for cardiothoracic surgery evaluation.  He was also found to have an incidental COVID-19 infection.   COVID-19 vaccinated status:   Significant Events: 4/30>> transfer to [facility] from [facility]-for CTVS evaluation of recurrent spontaneous pneumothorax  Significant studies: 4/30>> CT chest: Right-sided chest tube, trace pneumothorax-emphysema with prominent subpleural bulla within the right upper/lower lobes.  COVID-19 medications: None  Antibiotics: None  Microbiology data: 4/30>> COVID PCR: Positive  Procedures: 4/30 >> right-sided chest tube placement in the emergency room at [facility] 5/2>> right thoracoscopy-wedge resection  Consults: Cardiothoracic surgery  DVT prophylaxis: enoxaparin (LOVENOX) injection 40 mg Start: 10/31/20 1600 SCD's Start: 10/30/20 1642    Subjective:   No major issues overnight-lying comfortably in bed-pain well controlled with current narcotic regimen.   Assessment  &  Plan :   Right-sided pneumothorax-s/p VATS with wedge resection on 5/2: Cardiothoracic surgery following-defer care to cardiothoracic surgery.  COVID-19 infection: Incidental-unclear how long he has this infection-he has no symptoms-apart from observation-doubt any treatment required at this time.  Seizure disorder: Continue Depakote, Vimpat, Zonegran.  Polysubstance abuse: Per H&P-admitted to crack cocaine use to admitting MD-claims he only did heroin to this MD.  UDS checked-positive both narcotics and cocaine.  We will need continued counseling  Tobacco abuse: Refused transdermal nicotine   ABG: No results found for: PHART, PCO2ART, PO2ART, HCO3, TCO2, ACIDBASEDEF, O2SAT  Vent Settings: N/A    Condition -  Stable  Family Communication  : None at bedside.  Code Status :  Full Code  Diet :  Diet Order            DIET SOFT Room service appropriate? Yes; Fluid consistency: Thin  Diet effective now                  Disposition Plan  :   Status is: Inpatient  Remains inpatient appropriate because:Inpatient level of care appropriate due to severity of illness   Dispo: [facility]              Anticipated d/c is to: Prison              Patient currently is not medically stable to d/c.   Difficult to place patient No     Barriers to discharge: Chest tube in place  Antimicorbials  :    Anti-infectives (From admission, onward)   Start     Dose/Rate Route Frequency Ordered Stop   10/30/20 2130  ceFAZolin (ANCEF) IVPB 2g/100 mL premix        2 g 200 mL/hr over 30 Minutes Intravenous Every 8 hours 10/30/20 1641 10/31/20 0719      Inpatient Medications  Scheduled Meds: . acetaminophen  1,000 mg Oral Q8H   Or  . acetaminophen (TYLENOL) oral liquid 160 mg/5 mL  1,000 mg Oral Q8H  . bisacodyl  10 mg Oral Daily  . divalproex  500 mg Oral Daily  . divalproex  750 mg Oral QHS  . enoxaparin (LOVENOX) injection  40 mg Subcutaneous Q24H  . ketorolac   30 mg Intravenous Q6H  . lacosamide  100 mg Oral BID  . polyethylene glycol  17 g Oral Daily  . senna-docusate  1 tablet Oral QHS  . sodium chloride flush  3 mL Intravenous Q12H  . zonisamide  300 mg Oral QHS   Continuous Infusions: . sodium chloride     PRN Meds:.sodium chloride, HYDROmorphone (DILAUDID) injection, ondansetron **OR** ondansetron (ZOFRAN) IV, oxyCODONE, sodium chloride flush   Time Spent in minutes  15  See all Orders from today for further details   Jeoffrey Massed M.D on 11/02/2020 at 1:21 PM  To page go to [email or web address] - use universal password  [facility] -  Office  [phone]    Objective:   Vitals:   11/02/20 0000 11/02/20 0401 11/02/20 0733 11/02/20 1232  BP: 112/71 (!) 143/84 100/70 111/79  Pulse: 62 (!) 59 64 79  Resp: 16 15 16 18   Temp: 98.2 F (36.8 C) 98 F (36.7 C) 98.4 F (36.9 C) 98.5 F (36.9 C)  TempSrc: Oral Axillary Oral Oral  SpO2: 99% 96%    Weight:        Wt Readings from Last 3 Encounters:  10/28/20 67.4 kg  06/19/20 74.8 kg  05/30/20 75 kg     Intake/Output Summary (Last 24 hours) at 11/02/2020 1321 Last data filed at 11/02/2020 1015 Gross per 24 hour  Intake --  Output 500 ml  Net -500 ml     Physical Exam Gen Exam:Alert awake-not in any distress HEENT:atraumatic, normocephalic Chest: B/L clear to auscultation anteriorly CVS:S1S2 regular Abdomen:soft non tender, non distended Extremities:no edema Neurology: Non focal Skin: no rash   Data Review:    CBC Recent Labs  Lab 10/28/20 0329 10/29/20 0041 10/31/20 0057 11/01/20 0026  WBC 7.5 5.1 15.2* 12.7*  HGB 11.8* 13.4 12.9* 12.7*  HCT 35.1* 39.0 37.8* 37.1*  PLT  221 252 337 287  MCV 90.7 89.2 88.1 89.2  MCH 30.5 30.7 30.1 30.5  MCHC 33.6 34.4 34.1 34.2  RDW 11.9 11.8 11.9 12.0    Chemistries  Recent Labs  Lab 10/28/20 0329 10/29/20 0041 10/31/20 0057 11/01/20 0026  NA 132* 137 136 138  K 3.9 3.7 3.6 3.5  CL 99 101 106 107  CO2 26  26 20* 23  GLUCOSE 109* 107* 113* 116*  BUN 10 13 25* 22*  CREATININE 0.97 0.89 1.05 0.98  CALCIUM 8.7* 9.2 9.0 9.0  MG  --  2.0  --   --   AST  --   --   --  14*  ALT  --   --   --  8  ALKPHOS  --   --   --  52  BILITOT  --   --   --  0.6   ------------------------------------------------------------------------------------------------------------------ No results for input(s): CHOL, HDL, LDLCALC, TRIG, CHOLHDL, LDLDIRECT in the last 72 hours.  Lab Results  Component Value Date   HGBA1C 5.5 04/27/2020   ------------------------------------------------------------------------------------------------------------------ No results for input(s): TSH, T4TOTAL, T3FREE, THYROIDAB in the last 72 hours.  Invalid input(s): FREET3 ------------------------------------------------------------------------------------------------------------------ Recent Labs    10/31/20 0057 11/01/20 0026  FERRITIN 163 185    Coagulation profile No results for input(s): INR, PROTIME in the last 168 hours.  Recent Labs    11/01/20 0026 11/02/20 0301  DDIMER 1.01* 3.15*    Cardiac Enzymes No results for input(s): CKMB, TROPONINI, MYOGLOBIN in the last 168 hours.  Invalid input(s): CK ------------------------------------------------------------------------------------------------------------------ No results found for: BNP  Micro Results Recent Results (from the past 240 hour(s))  Resp Panel by RT-PCR (Flu A&B, Covid) Nasopharyngeal Swab     Status: Abnormal   Collection Time: 10/28/20  3:14 AM   Specimen: Nasopharyngeal Swab; Nasopharyngeal(NP) swabs in vial transport medium  Result Value Ref Range Status   SARS Coronavirus 2 by RT PCR POSITIVE (A) NEGATIVE Final    Comment: RESULT CALLED TO, READ BACK BY AND VERIFIED WITH: G PRUITT 4/30 @0451  BY S. BEARD (NOTE) SARS-CoV-2 target nucleic acids are DETECTED.  The SARS-CoV-2 RNA is generally detectable in upper respiratory specimens during  the acute phase of infection. Positive results are indicative of the presence of the identified virus, but do not rule out bacterial infection or co-infection with other pathogens not detected by the test. Clinical correlation with patient history and other diagnostic information is necessary to determine patient infection status. The expected result is Negative.  Fact Sheet for Patients:  Fact Sheet for Healthcare Providers: BloggerCourse.com  This test is not yet approved or cleared by the [employer] FDA and  has been authorized for detection and/or diagnosis of SARS-CoV-2 by FDA under an Emergency Use Authorization (EUA).  This EUA will remain in effect (meaning this test can be u sed) for the duration of  the COVID-19 declaration under Section 564(b)(1) of the Act, 21 U.S.C. section 360bbb-3(b)(1), unless the authorization is terminated or revoked sooner.     Influenza A by PCR NEGATIVE NEGATIVE Final   Influenza B by PCR NEGATIVE NEGATIVE Final    Comment: (NOTE) The Xpert Xpress SARS-CoV-2/FLU/RSV plus assay is intended as an aid in the diagnosis of influenza from Nasopharyngeal swab specimens and should not be used as a sole basis for treatment. Nasal washings and aspirates are unacceptable for Xpert Xpress SARS-CoV-2/FLU/RSV testing.  Fact Sheet for Patients: [place]  Fact Sheet for Healthcare Providers: BloggerCourse.com  This  test is not yet approved or cleared by the [place] and has been authorized for detection and/or diagnosis of SARS-CoV-2 by FDA under an Emergency Use Authorization (EUA). This EUA will remain in effect (meaning this test can be used) for the duration of the COVID-19 declaration under Section 564(b)(1) of the Act, 21 U.S.C. section 360bbb-3(b)(1), unless the authorization is terminated  or revoked.  Performed at [facility], [address] [place], [place] [address]     Radiology Reports CT CHEST WO CONTRAST  Result Date: 10/28/2020 CLINICAL DATA:  Pneumothorax, COVID-19 positive, chest tube EXAM: CT CHEST WITHOUT CONTRAST TECHNIQUE: Multidetector CT imaging of the chest was performed following the standard protocol without IV contrast. COMPARISON:  10/28/2020 FINDINGS: Cardiovascular: Unenhanced imaging of the heart and great vessels demonstrates no pericardial effusion. Normal caliber of the thoracic aorta. Mediastinum/Nodes: No enlarged mediastinal or axillary lymph nodes. Thyroid gland, trachea, and esophagus demonstrate no significant findings. Lungs/Pleura: There is an indwelling right-sided chest tube via a right anterolateral approach. Trace residual right basilar pneumothorax is identified, volume estimated far less than 5%. Emphysematous changes are seen, upper lobe predominant, with numerous subpleural bullous seen within the right lower and right upper lobes. Dependent consolidation within the right lower lobe consistent with atelectasis. Trace right pleural effusion. Central airways are patent. Upper Abdomen: No acute abnormality. Musculoskeletal: No acute or destructive bony lesions. Reconstructed images demonstrate no additional findings. IMPRESSION: 1. Right-sided chest tube as above, with trace residual right basilar pneumothorax volume estimated less than 5%. 2. Emphysema, with prominent subpleural bulla within the right upper and right lower lobes. 3. Minimal dependent lower lobe atelectasis, with trace right pleural effusion. Electronically Signed   By: Sharlet Salina M.D.   On: 10/28/2020 16:08   DG CHEST PORT 1 VIEW  Result Date: 11/02/2020 CLINICAL DATA:  Shortness of breath. EXAM: PORTABLE CHEST 1 VIEW COMPARISON:  11/01/2020. FINDINGS: Right chest tube in stable position. Interim increased right pneumothorax. Right pneumothorax remains small. Postsurgical  changes right lung. No pleural effusion. Heart size normal. IMPRESSION: Right chest tube in stable position. Interim increase in right-sided pneumothorax. Right mid thorax remains small. Postsurgical changes right lung. Electronically Signed   By: Maisie Fus  Register   On: 11/02/2020 07:49   DG CHEST PORT 1 VIEW  Result Date: 11/01/2020 CLINICAL DATA:  Pneumothorax, chest tube, COVID-19 positive EXAM: PORTABLE CHEST 1 VIEW COMPARISON:  Portable exam 0906 hours compared to 10/31/2020 FINDINGS: RIGHT thoracostomy tube again seen. Tiny RIGHT pneumothorax, slightly decreased. Normal heart size, mediastinal contours, and pulmonary vascularity. Lungs grossly clear. Staple line at RIGHT apex. No pleural effusion or acute osseous findings. IMPRESSION: Slight decrease in tiny RIGHT pneumothorax. Electronically Signed   By: Ulyses Southward M.D.   On: 11/01/2020 10:58   DG CHEST PORT 1 VIEW  Result Date: 10/31/2020 CLINICAL DATA:  Pneumothorax, chest tube EXAM: PORTABLE CHEST 1 VIEW COMPARISON:  Portable exam 0610 hours compared to 10/30/2020 FINDINGS: RIGHT thoracostomy tube stable. Normal heart size, mediastinal contours, and pulmonary vascularity. Persistent small RIGHT pneumothorax with adjacent RIGHT apex staple line. Remaining lungs clear. No pleural effusion or acute osseous findings. IMPRESSION: Persistent small RIGHT apex pneumothorax. Electronically Signed   By: Ulyses Southward M.D.   On: 10/31/2020 07:57   DG Chest Port 1 View  Result Date: 10/30/2020 CLINICAL DATA:  Postop lung surgery. EXAM: PORTABLE CHEST 1 VIEW COMPARISON:  10/28/2020 FINDINGS: There is a right-sided chest tube in place. No significant right-sided pneumothorax. The heart size is  stable. There is no acute osseous abnormality. IMPRESSION: Chest tube in place without evidence for significant right-sided pneumothorax. Electronically Signed   By: Katherine Mantlehristopher  Green M.D.   On: 10/30/2020 18:54   DG Chest Port 1 View  Result Date: 10/28/2020 CLINICAL  DATA:  Shortness of breath with history of spontaneous pneumothorax. EXAM: PORTABLE CHEST 1 VIEW COMPARISON:  June 19, 2020 FINDINGS: A small to moderate size pneumothorax is seen along the lateral aspect of the mid and lower right hemithorax. Mild atelectasis is seen within the medial aspect of the right lung base. There is no evidence of a pleural effusion. The heart size and mediastinal contours are within normal limits. Radiopaque surgical clips are seen overlying the right upper quadrant. The visualized skeletal structures are unremarkable. IMPRESSION: Small to moderate sized right-sided pneumothorax. Electronically Signed   By: Aram Candelahaddeus  Houston M.D.   On: 10/28/2020 02:49   DG Chest Port 1V same Day  Result Date: 10/28/2020 CLINICAL DATA:  Chest tube insertion, COVID-19 positive EXAM: PORTABLE CHEST 1 VIEW COMPARISON:  Radiograph 10/28/2020 FINDINGS: Patient is post placement of a right chest tube which terminates in the mid right lung towards midline. Small residual lateral right pneumothorax and some questionable trace right effusion. No left pneumothorax or pleural fluid. Some additional coarsened interstitial opacities are present which could reflect atelectasis or atypical infection in the setting of COVID 19. Stable cardiomediastinal contours. Telemetry leads overlie the chest. IMPRESSION: Interval placement of a right chest tube with decrease in size of the previously seen right pneumothorax Small residual right pneumothorax and trace right pleural fluid remains. Some coarsened interstitial opacities, possibly atelectasis versus atypical infection in the setting of COVID-19. Electronically Signed   By: Kreg ShropshirePrice  DeHay M.D.   On: 10/28/2020 06:50

## 2020-11-02 NOTE — Progress Notes (Signed)
Notified by radiology-CXR done earlier this afternoon showed complete collapse of right lung-spoke with Dr Lightfoot-recommendations were to unclamp chest tube-RN aware.

## 2020-11-02 NOTE — Progress Notes (Signed)
     [address]       [place] [address]             [phone]       No obvious leak on CT CXR shows slight increase Will perform clamping trial today  Medhansh Brinkmeier O Erza [place]

## 2020-11-03 ENCOUNTER — Inpatient Hospital Stay (HOSPITAL_COMMUNITY): Payer: Self-pay

## 2020-11-03 MED ORDER — APIXABAN 2.5 MG PO TABS
2.5000 mg | ORAL_TABLET | Freq: Two times a day (BID) | ORAL | Status: DC
  Administered 2020-11-03 – 2020-12-03 (×58): 2.5 mg via ORAL
  Filled 2020-11-03 (×60): qty 1

## 2020-11-03 NOTE — Progress Notes (Signed)
      [address]       [place] [address]             [phone]      4 Days Post-Op Procedure(s) (LRB): XI ROBOTIC ASSISTED THORASCOPY-WEDGE RESECTION,UPPER LOBE AND LOWER LOBE (Right) INTERCOSTAL NERVE BLOCK (Right) PLEURECTOMY (Right) Subjective: Sitting up in bed, says he is feeling better. Says he had trouble getting his pain medications on schedule yesterday.     Objective: Vital signs in last 24 hours: Temp:  [98.4 F (36.9 C)-99.2 F (37.3 C)] 98.5 F (36.9 C) (05/06 0816) Pulse Rate:  [56-82] 56 (05/06 0816) Cardiac Rhythm: Other (Comment) (05/05 2055) Resp:  [17-18] 18 (05/06 0816) BP: (104-118)/(74-81) 108/79 (05/06 0816) SpO2:  [97 %-98 %] 97 % (05/06 0337)     Intake/Output from previous day: 05/05 0701 - 05/06 0700 In: -  Out: 500 [Urine:500] Intake/Output this shift: Total I/O In: -  Out: 475 [Urine:475]  General appearance: alert, cooperative and mild distress Neurologic: intact Heart: regular rate and rhythm Lungs: Breath sounds are clear, full and equal. small air leak when talking, 3+ air leak with cough. CXR this am with CT on water seal shows similar right apical space as yesterday morning.  Wound: the right port incision and CT site are dry and intact. the tube is secure.   Lab Results: Recent Labs    11/01/20 0026  WBC 12.7*  HGB 12.7*  HCT 37.1*  PLT 287   BMET:  Recent Labs    11/01/20 0026  NA 138  K 3.5  CL 107  CO2 23  GLUCOSE 116*  BUN 22*  CREATININE 0.98  CALCIUM 9.0    PT/INR: No results for input(s): LABPROT, INR in the last 72 hours. ABG No results found for: PHART, HCO3, TCO2, ACIDBASEDEF, O2SAT CBG (last 3)  No results for input(s): GLUCAP in the last 72 hours.  Assessment/Plan: S/P Procedure(s) (LRB): XI ROBOTIC ASSISTED THORASCOPY-WEDGE RESECTION,UPPER LOBE AND LOWER LOBE (Right) INTERCOSTAL NERVE BLOCK (Right) PLEURECTOMY (Right)  -POD4 robotic-assisted right lung wedge  resections and pleurectomy for recurrent spontaneous PTX. Attempted clamp trial yesterday but he developed a large PTX.  Has active air leak this am, will leave on water seal for now. CXR in AM.     LOS: 6 days    Leary Roca, PA-C [phone] 11/03/2020

## 2020-11-03 NOTE — Progress Notes (Signed)
He has been refusing prophylaxis Lovenox. D/w Dr Jerral Ralph and we will use PO low dose apixaban instead.  Ulyses Southward, PharmD, BCIDP, AAHIVP, CPP Infectious Disease Pharmacist 11/03/2020 12:43 PM

## 2020-11-03 NOTE — Progress Notes (Signed)
PROGRESS NOTE                                                                                                                                                                                                             Patient Demographics:    Ronald Butler, is a 45 y.o. male, DOB - 1975/08/14, ZOX:[id number]  Outpatient Primary MD for the patient is Patient, No Pcp Per (Inactive)   Admit date - 10/28/2020   LOS - 6  Chief Complaint  Patient presents with  . Shortness of Breath    States that it hurts to breathe et that he has a collapsed upper right lung       Brief Narrative: Patient is a 45 y.o. male with PMHx of recurrent spontaneous pneumothorax-previously refused VATS-polysubstance abuse, seizure disorder-apprehended by law enforcement and incarcerated-subsequently developed chest pain and shortness of breath-was found to have right-sided pneumothorax-chest tube placed-[facility].  He was also found to have an incidental COVID-19 infection.   COVID-19 vaccinated status:   Significant Events: 4/30>> [facility]-for CTVS evaluation of recurrent spontaneous pneumothorax 5/5>> chest tube clamped-pneumothorax reoccurred with lung collapse  Significant studies: 4/30>> CT chest: Right-sided chest tube, trace pneumothorax-emphysema with prominent subpleural bulla within the right upper/lower lobes.  COVID-19 medications: None  Antibiotics: None  Microbiology data: 4/30>> COVID PCR: Positive  Procedures: 4/30 >> right-sided chest [facility] right thoracoscopy-wedge resection  Consults: Cardiothoracic surgery  DVT prophylaxis: apixaban (ELIQUIS) tablet 2.5 mg Start: 11/03/20 1330 Place and maintain sequential compression device Start: 11/03/20 0707 SCD's Start: 10/30/20 1642     Subjective:   Lying comfortably in bed-no major issues overnight.  Has refused Lovenox injections in the past.   Assessment  & Plan :   Right-sided pneumothorax-s/p VATS with wedge resection on 5/2: Cardiothoracic surgery following-defer care to cardiothoracic surgery.  COVID-19 infection: Incidental-unclear how long he has this infection-he has no symptoms-apart from observation-doubt any treatment required at this time.  Seizure disorder: Continue Depakote, Vimpat, Zonegran.  Polysubstance abuse: Per H&P-admitted to crack cocaine use to admitting MD-claims he only did heroin to this MD.  UDS checked-positive both narcotics and cocaine.  We will need continued counseling  Tobacco abuse: Refused transdermal nicotine   ABG:  No results found for: PHART, PCO2ART, PO2ART, HCO3, TCO2, ACIDBASEDEF, O2SAT  Vent Settings: N/A    Condition - Stable  Family Communication  : None at bedside.  Code Status :  Full Code  Diet :  Diet Order            DIET SOFT Room service appropriate? Yes; Fluid consistency: Thin  Diet effective now                  Disposition Plan  :   Status is: Inpatient  Remains inpatient appropriate because:Inpatient level of care appropriate due to severity of illness   Dispo: The [facility] from: Prison              Anticipated d/c is to: Prison              Patient currently is not medically stable to d/c.   Difficult to place patient No     Barriers to discharge: Chest tube in place  Antimicorbials  :    Anti-infectives (From admission, onward)   Start     Dose/Rate Route Frequency Ordered Stop   10/30/20 2130  ceFAZolin (ANCEF) IVPB 2g/100 mL premix        2 g 200 mL/hr over 30 Minutes Intravenous Every 8 hours 10/30/20 1641 10/31/20 0719      Inpatient Medications  Scheduled Meds: . acetaminophen  1,000 mg Oral Q8H   Or  . acetaminophen (TYLENOL) oral liquid 160 mg/5 mL  1,000 mg Oral Q8H  . apixaban  2.5 mg Oral BID  . bisacodyl   10 mg Oral Daily  . divalproex  500 mg Oral Daily  . divalproex  750 mg Oral QHS  . ketorolac  30 mg Intravenous Q6H  . lacosamide  100 mg Oral BID  . polyethylene glycol  17 g Oral Daily  . senna-docusate  1 tablet Oral QHS  . sodium chloride flush  3 mL Intravenous Q12H  . zonisamide  300 mg Oral QHS   Continuous Infusions: . sodium chloride     PRN Meds:.sodium chloride, HYDROmorphone (DILAUDID) injection, ondansetron **OR** ondansetron (ZOFRAN) IV, oxyCODONE, sodium chloride flush   Time Spent in minutes  15  See all Orders from today for further details   Jeoffrey Massed M.D on 11/03/2020 at 12:49 PM  To page go to [email or web address] - use universal password  [facility] -  Office  [phone]    Objective:   Vitals:   11/02/20 2344 11/03/20 0337 11/03/20 0816 11/03/20 1243  BP: 104/77 110/75 108/79 104/75  Pulse: 71 65 (!) 56 68  Resp: Temp: 98.9 F (37.2 C) 98.5 F (36.9 C) 98.5 F (36.9 C) 97.7 F (36.5 C)  TempSrc: Oral Oral Oral Oral  SpO2: 98% 97%    Weight:        Wt Readings from Last 3 Encounters:  10/28/20 67.4 kg  06/19/20 74.8 kg  05/30/20 75 kg     Intake/Output Summary (Last 24 hours) at 11/03/2020 1249 Last data filed at 11/03/2020 6644 Gross per 24 hour  Intake --  Output 475 ml  Net -475 ml     Physical Exam Gen Exam:Alert awake-not in any distress HEENT:atraumatic, normocephalic Chest: B/L clear to auscultation anteriorly CVS:S1S2 regular Abdomen:soft non tender, non distended Extremities:no edema Neurology: Non focal Skin: no rash   Data Review:    CBC Recent Labs  Lab 10/28/20 0329 10/29/20 0041 10/31/20 0057 11/01/20 0026  WBC 7.5  5.1 15.2* 12.7*  HGB 11.8* 13.4 12.9* 12.7*  HCT 35.1* 39.0 37.8* 37.1*  PLT 221 252 337 287  MCV 90.7 89.2 88.1 89.2  MCH 30.5 30.7 30.1 30.5  MCHC 33.6 34.4 34.1 34.2  RDW 11.9 11.8 11.9 12.0    Chemistries  Recent Labs  Lab 10/28/20 0329 10/29/20 0041  10/31/20 0057 11/01/20 0026  NA 132* 137 136 138  K 3.9 3.7 3.6 3.5  CL 99 101 106 107  CO2 26 26 20* 23  GLUCOSE 109* 107* 113* 116*  BUN 10 13 25* 22*  CREATININE 0.97 0.89 1.05 0.98  CALCIUM 8.7* 9.2 9.0 9.0  MG  --  2.0  --   --   AST  --   --   --  14*  ALT  --   --   --  8  ALKPHOS  --   --   --  52  BILITOT  --   --   --  0.6   ------------------------------------------------------------------------------------------------------------------ No results for input(s): CHOL, HDL, LDLCALC, TRIG, CHOLHDL, LDLDIRECT in the last 72 hours.  Lab Results  Component Value Date   HGBA1C 5.5 04/27/2020   ------------------------------------------------------------------------------------------------------------------ No results for input(s): TSH, T4TOTAL, T3FREE, THYROIDAB in the last 72 hours.  Invalid input(s): FREET3 ------------------------------------------------------------------------------------------------------------------ Recent Labs    11/01/20 0026  FERRITIN 185    Coagulation profile No results for input(s): INR, PROTIME in the last 168 hours.  Recent Labs    11/01/20 0026 11/02/20 0301  DDIMER 1.01* 3.15*    Cardiac Enzymes No results for input(s): CKMB, TROPONINI, MYOGLOBIN in the last 168 hours.  Invalid input(s): CK ------------------------------------------------------------------------------------------------------------------ No results found for: BNP  Micro Results Recent Results (from the past 240 hour(s))  Resp Panel by RT-PCR (Flu A&B, Covid) Nasopharyngeal Swab     Status: Abnormal   Collection Time: 10/28/20  3:14 AM   Specimen: Nasopharyngeal Swab; Nasopharyngeal(NP) swabs in vial transport medium  Result Value Ref Range Status   SARS Coronavirus 2 by RT PCR POSITIVE (A) NEGATIVE Final    Comment: RESULT CALLED TO, READ BACK BY AND VERIFIED WITH: G PRUITT 4/30 @0451  BY S. BEARD (NOTE) SARS-CoV-2 target nucleic acids are  DETECTED.  The SARS-CoV-2 RNA is generally detectable in upper respiratory specimens during the acute phase of infection. Positive results are indicative of the presence of the identified virus, but do not rule out bacterial infection or co-infection with other pathogens not detected by the test. Clinical correlation with patient history and other diagnostic information is necessary to determine patient infection status. The expected result is Negative.  Fact Sheet for Patients:  Fact Sheet for Healthcare Providers: BloggerCourse.com  This test is not yet approved or cleared by the [place] FDA and  has been authorized for detection and/or diagnosis of SARS-CoV-2 by FDA under an Emergency Use Authorization (EUA).  This EUA will remain in effect (meaning this test can be u sed) for the duration of  the COVID-19 declaration under Section 564(b)(1) of the Act, 21 U.S.C. section 360bbb-3(b)(1), unless the authorization is terminated or revoked sooner.     Influenza A by PCR NEGATIVE NEGATIVE Final   Influenza B by PCR NEGATIVE NEGATIVE Final    Comment: (NOTE) The Xpert Xpress SARS-CoV-2/FLU/RSV plus assay is intended as an aid in the diagnosis of influenza from Nasopharyngeal swab specimens and should not be used as a sole basis for treatment. Nasal washings and aspirates are unacceptable for Xpert Xpress SARS-CoV-2/FLU/RSV testing.  Fact Sheet for Patients: BloggerCourse.com  Fact Sheet for Healthcare Providers: SeriousBroker.it  This test is not yet approved or cleared by the [place] FDA and has been authorized for detection and/or diagnosis of SARS-CoV-2 by FDA under an Emergency Use Authorization (EUA). This EUA will remain in effect (meaning this test can be used) for the duration of the COVID-19 declaration under Section 564(b)(1) of the Act, 21  U.S.C. section 360bbb-3(b)(1), unless the authorization is terminated or revoked.  Performed at [facility], [address], [place], [place] [address]     Radiology Reports CT CHEST WO CONTRAST  Result Date: 10/28/2020 CLINICAL DATA:  Pneumothorax, COVID-19 positive, chest tube EXAM: CT CHEST WITHOUT CONTRAST TECHNIQUE: Multidetector CT imaging of the chest was performed following the standard protocol without IV contrast. COMPARISON:  10/28/2020 FINDINGS: Cardiovascular: Unenhanced imaging of the heart and great vessels demonstrates no pericardial effusion. Normal caliber of the thoracic aorta. Mediastinum/Nodes: No enlarged mediastinal or axillary lymph nodes. Thyroid gland, trachea, and esophagus demonstrate no significant findings. Lungs/Pleura: There is an indwelling right-sided chest tube via a right anterolateral approach. Trace residual right basilar pneumothorax is identified, volume estimated far less than 5%. Emphysematous changes are seen, upper lobe predominant, with numerous subpleural bullous seen within the right lower and right upper lobes. Dependent consolidation within the right lower lobe consistent with atelectasis. Trace right pleural effusion. Central airways are patent. Upper Abdomen: No acute abnormality. Musculoskeletal: No acute or destructive bony lesions. Reconstructed images demonstrate no additional findings. IMPRESSION: 1. Right-sided chest tube as above, with trace residual right basilar pneumothorax volume estimated less than 5%. 2. Emphysema, with prominent subpleural bulla within the right upper and right lower lobes. 3. Minimal dependent lower lobe atelectasis, with trace right pleural effusion. Electronically Signed   By: Sharlet Salina M.D.   On: 10/28/2020 16:08   DG Chest Port 1 View  Result Date: 11/03/2020 CLINICAL DATA:  Pneumothorax with chest tube in place EXAM: PORTABLE CHEST 1 VIEW COMPARISON:  Nov 02, 2020 FINDINGS: Chest tube present on the right.  Pneumothorax is noted on the right in the lateral and apical regions, considerably smaller compared to 1 day prior. No tension component. Postoperative change noted on the right. There are areas of scattered mild scarring in the right base. There is no edema or airspace opacity. Heart size and pulmonary vascularity are normal. No adenopathy. No bone lesions. Postoperative change noted in lower cervical region. IMPRESSION: Chest tube remains on the right. Right pneumothorax considerably smaller compared to 1 day prior, although pneumothorax is evident in the apical and lateral aspects on the right. No tension component. Mild scarring right base. No edema or airspace opacity. Cardiac silhouette normal. Electronically Signed   By: Bretta Bang III M.D.   On: 11/03/2020 09:15   DG Chest Port 1 View  Result Date: 11/02/2020 CLINICAL DATA:  Shortness of breath, post clamping of chest tube EXAM: PORTABLE CHEST 1 VIEW COMPARISON:  Portable exam 1610 hours compared to 0629 hours FINDINGS: RIGHT thoracostomy tube unchanged. Subtotal collapse of RIGHT lung. No definite mediastinal shift. Heart size normal. LEFT lung clear. IMPRESSION: Subtotal collapse of RIGHT lung despite presence of RIGHT thoracostomy tube. No definite mediastinal shift. LEFT lung clear. Critical Value/emergent results were called by telephone at the time of interpretation on 11/02/2020 at 7:04 pm to provider Brynda Greathouse MD, who verbally acknowledged these results. Electronically Signed   By: Ulyses Southward M.D.   On: 11/02/2020 19:04   DG CHEST PORT 1  VIEW  Result Date: 11/02/2020 CLINICAL DATA:  Shortness of breath. EXAM: PORTABLE CHEST 1 VIEW COMPARISON:  11/01/2020. FINDINGS: Right chest tube in stable position. Interim increased right pneumothorax. Right pneumothorax remains small. Postsurgical changes right lung. No pleural effusion. Heart size normal. IMPRESSION: Right chest tube in stable position. Interim increase in right-sided  pneumothorax. Right mid thorax remains small. Postsurgical changes right lung. Electronically Signed   By: Maisie Fus  Register   On: 11/02/2020 07:49   DG CHEST PORT 1 VIEW  Result Date: 11/01/2020 CLINICAL DATA:  Pneumothorax, chest tube, COVID-19 positive EXAM: PORTABLE CHEST 1 VIEW COMPARISON:  Portable exam 0906 hours compared to 10/31/2020 FINDINGS: RIGHT thoracostomy tube again seen. Tiny RIGHT pneumothorax, slightly decreased. Normal heart size, mediastinal contours, and pulmonary vascularity. Lungs grossly clear. Staple line at RIGHT apex. No pleural effusion or acute osseous findings. IMPRESSION: Slight decrease in tiny RIGHT pneumothorax. Electronically Signed   By: Ulyses Southward M.D.   On: 11/01/2020 10:58   DG CHEST PORT 1 VIEW  Result Date: 10/31/2020 CLINICAL DATA:  Pneumothorax, chest tube EXAM: PORTABLE CHEST 1 VIEW COMPARISON:  Portable exam 0610 hours compared to 10/30/2020 FINDINGS: RIGHT thoracostomy tube stable. Normal heart size, mediastinal contours, and pulmonary vascularity. Persistent small RIGHT pneumothorax with adjacent RIGHT apex staple line. Remaining lungs clear. No pleural effusion or acute osseous findings. IMPRESSION: Persistent small RIGHT apex pneumothorax. Electronically Signed   By: Ulyses Southward M.D.   On: 10/31/2020 07:57   DG Chest Port 1 View  Result Date: 10/30/2020 CLINICAL DATA:  Postop lung surgery. EXAM: PORTABLE CHEST 1 VIEW COMPARISON:  10/28/2020 FINDINGS: There is a right-sided chest tube in place. No significant right-sided pneumothorax. The heart size is stable. There is no acute osseous abnormality. IMPRESSION: Chest tube in place without evidence for significant right-sided pneumothorax. Electronically Signed   By: Katherine Mantle M.D.   On: 10/30/2020 18:54   DG Chest Port 1 View  Result Date: 10/28/2020 CLINICAL DATA:  Shortness of breath with history of spontaneous pneumothorax. EXAM: PORTABLE CHEST 1 VIEW COMPARISON:  June 19, 2020 FINDINGS: A  small to moderate size pneumothorax is seen along the lateral aspect of the mid and lower right hemithorax. Mild atelectasis is seen within the medial aspect of the right lung base. There is no evidence of a pleural effusion. The heart size and mediastinal contours are within normal limits. Radiopaque surgical clips are seen overlying the right upper quadrant. The visualized skeletal structures are unremarkable. IMPRESSION: Small to moderate sized right-sided pneumothorax. Electronically Signed   By: Aram Candela M.D.   On: 10/28/2020 02:49   DG Chest Port 1V same Day  Result Date: 10/28/2020 CLINICAL DATA:  Chest tube insertion, COVID-19 positive EXAM: PORTABLE CHEST 1 VIEW COMPARISON:  Radiograph 10/28/2020 FINDINGS: Patient is post placement of a right chest tube which terminates in the mid right lung towards midline. Small residual lateral right pneumothorax and some questionable trace right effusion. No left pneumothorax or pleural fluid. Some additional coarsened interstitial opacities are present which could reflect atelectasis or atypical infection in the setting of COVID 19. Stable cardiomediastinal contours. Telemetry leads overlie the chest. IMPRESSION: Interval placement of a right chest tube with decrease in size of the previously seen right pneumothorax Small residual right pneumothorax and trace right pleural fluid remains. Some coarsened interstitial opacities, possibly atelectasis versus atypical infection in the setting of COVID-19. Electronically Signed   By: Kreg Shropshire M.D.   On: 10/28/2020 06:50

## 2020-11-04 ENCOUNTER — Inpatient Hospital Stay (HOSPITAL_COMMUNITY): Payer: Self-pay

## 2020-11-04 NOTE — Progress Notes (Signed)
Pt was seen for mobility on RW after initially attempting gait without an AD.  After PT saw pt, the MD came by to have pt return to suction for his drain.  Pt was in significant pain of 10/10, and was only able to walk around the bed when PT came by.  Will continue to work on mobility with pt and focus on his endurance as tolerated, and will recommend him to have PT to follow up if this is available to him.  Focus on acute PT goals as listed below.  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  Found incidental Covid, had wedge resection with R thorascopy, noted prominent subpleural bulla in R upper and lower lobes. Had collapse of R lung while clamped, now back on suction.  PMHx: pancreatitis, recurrent pneumothorax, seizures, cholecystectomy, smoker, THC  Precautions  Precautions Fall  Precaution Comments telemetry  Restrictions  Weight Bearing Restrictions No  Home Living  Family/patient expects to be discharged to: [facility]  Prior Function  Level of Independence Independent  Communication  Communication No difficulties  Pain Assessment  Pain Assessment 0-10  Pain Score 10  Pain Location R side of trunk at drain site  Pain Descriptors / Indicators Operative site guarding  Pain Intervention(s) Limited activity within patient's tolerance;Monitored during session;Repositioned;Patient requesting pain meds-RN notified  Cognition  Arousal/Alertness Awake/alert  Behavior During Therapy WFL for tasks assessed/performed  Overall Cognitive Status Within Functional Limits for tasks assessed  General Comments pt is commenting on his ability to move and concern for pain appropriately  Upper Extremity Assessment  Upper Extremity Assessment Overall WFL for tasks assessed  Lower Extremity Assessment  Lower Extremity Assessment  Overall WFL for tasks assessed  Cervical / Trunk Assessment  Cervical / Trunk Assessment Other exceptions (has chest tube in with recent pneumothorax and sx to correct)  Bed Mobility  Overal bed mobility Needs Assistance  Bed Mobility Supine to Sit  Supine to sit Min guard  General bed mobility comments min guard for trunk support and managing lines  Transfers  Overall transfer level Needs assistance  Transfers Sit to/from Stand  Sit to Stand Min guard  General transfer comment min guard for safety  Ambulation/Gait  Ambulation/Gait assistance Min guard  Gait Distance (Feet) 16 Feet  Assistive device Rolling walker (2 wheeled);1 person hand held assist  Gait Pattern/deviations Step-to pattern;Step-through pattern;Decreased stride length;Wide base of support  General Gait Details careful walk with two stops due to pain and fatigue  Gait velocity reduced  Gait velocity interpretation <1.31 ft/sec, indicative of household ambulator  Balance  Overall balance assessment Needs assistance  Sitting-balance support Feet supported  Sitting balance-Leahy Scale Good  Standing balance support No upper extremity supported;Bilateral upper extremity supported  Standing balance-Leahy Scale Fair  Standing balance comment walker is helpful for mobility  General Comments  General comments (skin integrity, edema, etc.) pt is walking with RW due to struggle to stand upright without the support  Exercises  Exercises Other exercises (LE strength is [facility])  PT - End of Session  Activity Tolerance Patient limited by fatigue;Treatment limited secondary to medical complications (Comment)  Patient left in bed;with call bell/phone within reach;with bed alarm set  Nurse Communication Mobility status  PT Assessment  PT Recommendation/Assessment Patient needs continued PT services  PT Visit Diagnosis Unsteadiness on feet (R26.81);Difficulty  in walking, not elsewhere classified (R26.2)  PT Problem List Decreased  activity tolerance;Decreased balance;Decreased mobility;Decreased coordination;Decreased knowledge of use of DME;Cardiopulmonary status limiting activity;Decreased skin integrity;Pain  PT Plan  PT Frequency (ACUTE ONLY) Min 3X/week  PT Treatment/Interventions (ACUTE ONLY) DME instruction;Gait training;Functional mobility training;Therapeutic activities;Therapeutic exercise;Balance training;Neuromuscular re-education;Patient/family education  AM-PAC PT "6 Clicks" Mobility Outcome Measure (Version 2)  Help needed turning from your back to your side while in a flat bed without using bedrails? 4  Help needed moving from lying on your back to sitting on the side of a flat bed without using bedrails? 3  Help needed moving to and from a bed to a chair (including a wheelchair)? 3  Help needed standing up from a chair using your arms (e.g., wheelchair or bedside chair)? 3  Help needed to walk in hospital room? 3  Help needed climbing 3-5 steps with a railing?  3  6 Click Score 19  Consider Recommendation of Discharge To: Home with [facility]  PT Recommendation  Follow Up Recommendations Supervision for mobility/OOB;Supervision/Assistance - 24 hour  PT equipment Rolling walker with 5" wheels  Individuals Consulted  Consulted and Agree with Results and Recommendations Patient  Acute Rehab PT Goals  Patient Stated Goal to get pain managed  PT Goal Formulation With patient  Time For Goal Achievement 11/11/20  Potential to Achieve Goals Good  PT Time Calculation  PT Start Time (ACUTE ONLY) 1023  PT Stop Time (ACUTE ONLY) 1052  PT Time Calculation (min) (ACUTE ONLY) 29 min  PT General Charges  $$ ACUTE PT VISIT 1 Visit  PT Evaluation  $PT Eval Moderate Complexity 1 Mod  PT Treatments  $Gait Training 8-22 mins  Written Expression  Dominant Hand Right    Samul Dada, PT MS Acute Rehab Dept. Number: [facility] [id number] and [facility] [phone]

## 2020-11-04 NOTE — Progress Notes (Signed)
PROGRESS NOTE                                                                                                                                                                                                             Patient Demographics:    Ronald Butler, is a 45 y.o. male, DOB - 11/29/1975, FOY:[id number]  Outpatient Primary MD for the patient is Patient, No Pcp Per (Inactive)   Admit date - 10/28/2020   LOS - 7  Chief Complaint  Patient presents with  . Shortness of Breath    States that it hurts to breathe et that he has a collapsed upper right lung       Brief Narrative: Patient is a 45 y.o. male with PMHx of recurrent spontaneous pneumothorax-previously refused VATS-polysubstance abuse, seizure disorder-apprehended by law enforcement and incarcerated-subsequently developed chest pain and shortness of breath-was found to have right-sided pneumothorax-chest tube placed-patient was then transferred to [facility] for cardiothoracic surgery evaluation.  He was also found to have an incidental COVID-19 infection.   COVID-19 vaccinated status:   Significant Events: 4/30>> transfer to [facility] from [facility]-for CTVS evaluation of recurrent spontaneous pneumothorax 5/5>> chest tube clamped-pneumothorax reoccurred with lung collapse  Significant studies: 4/30>> CT chest: Right-sided chest tube, trace pneumothorax-emphysema with prominent subpleural bulla within the right upper/lower lobes.  COVID-19 medications: None  Antibiotics: None  Microbiology data: 4/30>> COVID PCR: Positive  Procedures: 4/30 >> right-sided chest tube placement in the emergency room at [facility] 5/2>> right thoracoscopy-wedge resection  Consults: Cardiothoracic surgery  DVT prophylaxis: apixaban (ELIQUIS) tablet 2.5 mg Start: 11/03/20 1330 Place and maintain sequential compression device Start: 11/03/20 0707 SCD's Start: 10/30/20 1642     Subjective:   Lying comfortably in bed.  Denies any shortness of breath.   Assessment  & Plan :   Right-sided pneumothorax-s/p VATS with wedge resection on 5/2: Cardiothoracic surgery following-defer care to cardiothoracic surgery.  COVID-19 infection: Incidental-unclear how long he has this infection-he has no symptoms-apart from observation-doubt any treatment required at this time.  Note-patient refused SQ Lovenox-hence switched him to prophylactic Eliquis.  Seizure disorder: Continue Depakote, Vimpat, Zonegran.  Polysubstance abuse: Per H&P-admitted to crack cocaine use to admitting MD-claims he only did heroin to this MD.  UDS checked-positive both narcotics and cocaine.  We will need continued counseling  Tobacco abuse: Refused  transdermal nicotine   ABG: No results found for: PHART, PCO2ART, PO2ART, HCO3, TCO2, ACIDBASEDEF, O2SAT  Vent Settings: N/A    Condition - Stable  Family Communication  : None at bedside.  Code Status :  Full Code  Diet :  Diet Order            DIET SOFT Room service appropriate? Yes; Fluid consistency: Thin  Diet effective now                  Disposition Plan  :   Status is: Inpatient  Remains inpatient appropriate because:Inpatient level of care appropriate due to severity of illness   Dispo: The [facility] from[facility] Prison              Anticipated d/c is to: Prison              Patient currently is not medically stable to d/c.   Difficult to place patient No     Barriers to discharge: Chest tube in place  Antimicorbials  :    Anti-infectives (From admission, onward)   Start     Dose/Rate Route Frequency Ordered Stop   10/30/20 2130  ceFAZolin (ANCEF) IVPB 2g/100 mL premix        2 g 200 mL/hr over 30 Minutes Intravenous Every 8 hours 10/30/20 1641 10/31/20 0719      Inpatient Medications  Scheduled Meds: . acetaminophen  1,000 mg Oral Q8H   Or  . acetaminophen (TYLENOL) oral liquid 160 mg/5 mL  1,000 mg Oral Q8H  .  apixaban  2.5 mg Oral BID  . bisacodyl  10 mg Oral Daily  . divalproex  500 mg Oral Daily  . divalproex  750 mg Oral QHS  . lacosamide  100 mg Oral BID  . polyethylene glycol  17 g Oral Daily  . senna-docusate  1 tablet Oral QHS  . sodium chloride flush  3 mL Intravenous Q12H  . zonisamide  300 mg Oral QHS   Continuous Infusions: . sodium chloride     PRN Meds:.sodium chloride, HYDROmorphone (DILAUDID) injection, ondansetron **OR** ondansetron (ZOFRAN) IV, oxyCODONE, sodium chloride flush   Time Spent in minutes  15  See all Orders from today for further details   Jeoffrey MassedShanker Armelia Penton M.D on 11/04/2020 at 2:25 PM  To page go to [email or web address] - use universal password  [facility] -  Office  [phone]    Objective:   Vitals:   11/03/20 1634 11/03/20 2126 11/04/20 0448 11/04/20 1212  BP: 115/73 118/79 104/68 92/74  Pulse: 69 79 77 79  Resp: 16 17 16 19   Temp: 98.4 F (36.9 C) 98.1 F (36.7 C) 99.2 F (37.3 C) 99.2 F (37.3 C)  TempSrc: Oral Oral Oral Oral  SpO2:  100% 100% 100%  Weight:        Wt Readings from Last 3 Encounters:  10/28/20 67.4 kg  06/19/20 74.8 kg  05/30/20 75 kg     Intake/Output Summary (Last 24 hours) at 11/04/2020 1425 Last data filed at 11/04/2020 0900 Gross per 24 hour  Intake 240 ml  Output 625 ml  Net -385 ml     Physical Exam Gen Exam:Alert awake-not in any distress HEENT:atraumatic, normocephalic Chest: B/L clear to auscultation anteriorly CVS:S1S2 regular Abdomen:soft non tender, non distended Extremities:no edema Neurology: Non focal Skin: no rash   Data Review:    CBC Recent Labs  Lab 10/29/20 0041 10/31/20 0057 11/01/20 0026  WBC 5.1 15.2* 12.7*  HGB 13.4  12.9* 12.7*  HCT 39.0 37.8* 37.1*  PLT 252 337 287  MCV 89.2 88.1 89.2  MCH 30.7 30.1 30.5  MCHC 34.4 34.1 34.2  RDW 11.8 11.9 12.0    Chemistries  Recent Labs  Lab 10/29/20 0041 10/31/20 0057 11/01/20 0026  NA 137 136 138  K 3.7 3.6 3.5  CL  101 106 107  CO2 26 20* 23  GLUCOSE 107* 113* 116*  BUN 13 25* 22*  CREATININE 0.89 1.05 0.98  CALCIUM 9.2 9.0 9.0  MG 2.0  --   --   AST  --   --  14*  ALT  --   --  8  ALKPHOS  --   --  52  BILITOT  --   --  0.6   ------------------------------------------------------------------------------------------------------------------ No results for input(s): CHOL, HDL, LDLCALC, TRIG, CHOLHDL, LDLDIRECT in the last 72 hours.  Lab Results  Component Value Date   HGBA1C 5.5 04/27/2020   ------------------------------------------------------------------------------------------------------------------ No results for input(s): TSH, T4TOTAL, T3FREE, THYROIDAB in the last 72 hours.  Invalid input(s): FREET3 ------------------------------------------------------------------------------------------------------------------ No results for input(s): VITAMINB12, FOLATE, FERRITIN, TIBC, IRON, RETICCTPCT in the last 72 hours.  Coagulation profile No results for input(s): INR, PROTIME in the last 168 hours.  Recent Labs    11/02/20 0301  DDIMER 3.15*    Cardiac Enzymes No results for input(s): CKMB, TROPONINI, MYOGLOBIN in the last 168 hours.  Invalid input(s): CK ------------------------------------------------------------------------------------------------------------------ No results found for: BNP  Micro Results Recent Results (from the past 240 hour(s))  Resp Panel by RT-PCR (Flu A&B, Covid) Nasopharyngeal Swab     Status: Abnormal   Collection Time: 10/28/20  3:14 AM   Specimen: Nasopharyngeal Swab; Nasopharyngeal(NP) swabs in vial transport medium  Result Value Ref Range Status   SARS Coronavirus 2 by RT PCR POSITIVE (A) NEGATIVE Final    Comment: RESULT CALLED TO, READ BACK BY AND VERIFIED WITH: G PRUITT 4/30  BY S. BEARD (NOTE) SARS-CoV-2 target nucleic acids are DETECTED.  The SARS-CoV-2 RNA is generally detectable in upper respiratory specimens during the acute phase  of infection. Positive results are indicative of the presence of the identified virus, but do not rule out bacterial infection or co-infection with other pathogens not detected by the test. Clinical correlation with patient history and other diagnostic information is necessary to determine patient infection status. The expected result is Negative.  Fact Sheet for Patients: BloggerCourse.com  Fact Sheet for Healthcare Providers: SeriousBroker.it  This test is not yet approved or cleared by the [place] FDA and  has been authorized for detection and/or diagnosis of SARS-CoV-2 by FDA under an Emergency Use Authorization (EUA).  This EUA will remain in effect (meaning this test can be u sed) for the duration of  the COVID-19 declaration under Section 564(b)(1) of the Act, 21 U.S.C. section 360bbb-3(b)(1), unless the authorization is terminated or revoked sooner.     Influenza A by PCR NEGATIVE NEGATIVE Final   Influenza B by PCR NEGATIVE NEGATIVE Final    Comment: (NOTE) The Xpert Xpress SARS-CoV-2/FLU/RSV plus assay is intended as an aid in the diagnosis of influenza from Nasopharyngeal swab specimens and should not be used as a sole basis for treatment. Nasal washings and aspirates are unacceptable for Xpert Xpress SARS-CoV-2/FLU/RSV testing.  Fact Sheet for Patients: BloggerCourse.com  Fact Sheet for Healthcare Providers: SeriousBroker.it  This test is not yet approved or cleared by the [place] FDA and has been authorized for detection and/or diagnosis of SARS-CoV-2 by FDA  under an Emergency Use Authorization (EUA). This EUA will remain in effect (meaning this test can be used) for the duration of the COVID-19 declaration under Section 564(b)(1) of the Act, 21 U.S.C. section 360bbb-3(b)(1), unless the authorization is terminated or revoked.  Performed at [facility], [address], [place], [place] [address]     Radiology Reports CT CHEST WO CONTRAST  Result Date: 10/28/2020 CLINICAL DATA:  Pneumothorax, COVID-19 positive, chest tube EXAM: CT CHEST WITHOUT CONTRAST TECHNIQUE: Multidetector CT imaging of the chest was performed following the standard protocol without IV contrast. COMPARISON:  10/28/2020 FINDINGS: Cardiovascular: Unenhanced imaging of the heart and great vessels demonstrates no pericardial effusion. Normal caliber of the thoracic aorta. Mediastinum/Nodes: No enlarged mediastinal or axillary lymph nodes. Thyroid gland, trachea, and esophagus demonstrate no significant findings. Lungs/Pleura: There is an indwelling right-sided chest tube via a right anterolateral approach. Trace residual right basilar pneumothorax is identified, volume estimated far less than 5%. Emphysematous changes are seen, upper lobe predominant, with numerous subpleural bullous seen within the right lower and right upper lobes. Dependent consolidation within the right lower lobe consistent with atelectasis. Trace right pleural effusion. Central airways are patent. Upper Abdomen: No acute abnormality. Musculoskeletal: No acute or destructive bony lesions. Reconstructed images demonstrate no additional findings. IMPRESSION: 1. Right-sided chest tube as above, with trace residual right basilar pneumothorax volume estimated less than 5%. 2. Emphysema, with prominent subpleural bulla within the right upper and right lower lobes. 3. Minimal dependent lower lobe atelectasis, with trace right pleural effusion. Electronically Signed   By: Sharlet Salina M.D.   On: 10/28/2020 16:08   DG CHEST PORT 1 VIEW  Result Date: 11/04/2020 CLINICAL DATA:  Right-sided pneumothorax with chest tube in place EXAM: PORTABLE CHEST 1 VIEW COMPARISON:  Prior chest x-Bernie yesterday 11/03/2020 FINDINGS: Stable and well positioned right-sided thoracostomy tube. Persistent and slightly increased right-sided  pneumothorax. Associated atelectasis in the right lower lobe. The left lung remains clear. Cardiac and mediastinal contours remain clear. No acute osseous abnormality. IMPRESSION: Persistent and perhaps slightly enlarged right-sided pneumothorax with well-positioned chest tube in place. Electronically Signed   By: Malachy Moan M.D.   On: 11/04/2020 09:17   DG Chest Port 1 View  Result Date: 11/03/2020 CLINICAL DATA:  Pneumothorax with chest tube in place EXAM: PORTABLE CHEST 1 VIEW COMPARISON:  Nov 02, 2020 FINDINGS: Chest tube present on the right. Pneumothorax is noted on the right in the lateral and apical regions, considerably smaller compared to 1 day prior. No tension component. Postoperative change noted on the right. There are areas of scattered mild scarring in the right base. There is no edema or airspace opacity. Heart size and pulmonary vascularity are normal. No adenopathy. No bone lesions. Postoperative change noted in lower cervical region. IMPRESSION: Chest tube remains on the right. Right pneumothorax considerably smaller compared to 1 day prior, although pneumothorax is evident in the apical and lateral aspects on the right. No tension component. Mild scarring right base. No edema or airspace opacity. Cardiac silhouette normal. Electronically Signed   By: Bretta Bang III M.D.   On: 11/03/2020 09:15   DG Chest Port 1 View  Result Date: 11/02/2020 CLINICAL DATA:  Shortness of breath, post clamping of chest tube EXAM: PORTABLE CHEST 1 VIEW COMPARISON:  Portable exam 1610 hours compared to 0629 hours FINDINGS: RIGHT thoracostomy tube unchanged. Subtotal collapse of RIGHT lung. No definite mediastinal shift. Heart size normal. LEFT lung clear. IMPRESSION: Subtotal collapse of RIGHT lung despite  presence of RIGHT thoracostomy tube. No definite mediastinal shift. LEFT lung clear. Critical Value/emergent results were called by telephone at the time of interpretation on 11/02/2020 at 7:04 pm  to provider Brynda Greathouse MD, who verbally acknowledged these results. Electronically Signed   By: Ulyses Southward M.D.   On: 11/02/2020 19:04   DG CHEST PORT 1 VIEW  Result Date: 11/02/2020 CLINICAL DATA:  Shortness of breath. EXAM: PORTABLE CHEST 1 VIEW COMPARISON:  11/01/2020. FINDINGS: Right chest tube in stable position. Interim increased right pneumothorax. Right pneumothorax remains small. Postsurgical changes right lung. No pleural effusion. Heart size normal. IMPRESSION: Right chest tube in stable position. Interim increase in right-sided pneumothorax. Right mid thorax remains small. Postsurgical changes right lung. Electronically Signed   By: Maisie Fus  Register   On: 11/02/2020 07:49   DG CHEST PORT 1 VIEW  Result Date: 11/01/2020 CLINICAL DATA:  Pneumothorax, chest tube, COVID-19 positive EXAM: PORTABLE CHEST 1 VIEW COMPARISON:  Portable exam 0906 hours compared to 10/31/2020 FINDINGS: RIGHT thoracostomy tube again seen. Tiny RIGHT pneumothorax, slightly decreased. Normal heart size, mediastinal contours, and pulmonary vascularity. Lungs grossly clear. Staple line at RIGHT apex. No pleural effusion or acute osseous findings. IMPRESSION: Slight decrease in tiny RIGHT pneumothorax. Electronically Signed   By: Ulyses Southward M.D.   On: 11/01/2020 10:58   DG CHEST PORT 1 VIEW  Result Date: 10/31/2020 CLINICAL DATA:  Pneumothorax, chest tube EXAM: PORTABLE CHEST 1 VIEW COMPARISON:  Portable exam 0610 hours compared to 10/30/2020 FINDINGS: RIGHT thoracostomy tube stable. Normal heart size, mediastinal contours, and pulmonary vascularity. Persistent small RIGHT pneumothorax with adjacent RIGHT apex staple line. Remaining lungs clear. No pleural effusion or acute osseous findings. IMPRESSION: Persistent small RIGHT apex pneumothorax. Electronically Signed   By: Ulyses Southward M.D.   On: 10/31/2020 07:57   DG Chest Port 1 View  Result Date: 10/30/2020 CLINICAL DATA:  Postop lung surgery. EXAM: PORTABLE CHEST 1  VIEW COMPARISON:  10/28/2020 FINDINGS: There is a right-sided chest tube in place. No significant right-sided pneumothorax. The heart size is stable. There is no acute osseous abnormality. IMPRESSION: Chest tube in place without evidence for significant right-sided pneumothorax. Electronically Signed   By: Katherine Mantle M.D.   On: 10/30/2020 18:54   DG Chest Port 1 View  Result Date: 10/28/2020 CLINICAL DATA:  Shortness of breath with history of spontaneous pneumothorax. EXAM: PORTABLE CHEST 1 VIEW COMPARISON:  June 19, 2020 FINDINGS: A small to moderate size pneumothorax is seen along the lateral aspect of the mid and lower right hemithorax. Mild atelectasis is seen within the medial aspect of the right lung base. There is no evidence of a pleural effusion. The heart size and mediastinal contours are within normal limits. Radiopaque surgical clips are seen overlying the right upper quadrant. The visualized skeletal structures are unremarkable. IMPRESSION: Small to moderate sized right-sided pneumothorax. Electronically Signed   By: Aram Candela M.D.   On: 10/28/2020 02:49   DG Chest Port 1V same Day  Result Date: 10/28/2020 CLINICAL DATA:  Chest tube insertion, COVID-19 positive EXAM: PORTABLE CHEST 1 VIEW COMPARISON:  Radiograph 10/28/2020 FINDINGS: Patient is post placement of a right chest tube which terminates in the mid right lung towards midline. Small residual lateral right pneumothorax and some questionable trace right effusion. No left pneumothorax or pleural fluid. Some additional coarsened interstitial opacities are present which could reflect atelectasis or atypical infection in the setting of COVID 19. Stable cardiomediastinal contours. Telemetry leads overlie the chest. IMPRESSION: Interval  placement of a right chest tube with decrease in size of the previously seen right pneumothorax Small residual right pneumothorax and trace right pleural fluid remains. Some coarsened  interstitial opacities, possibly atelectasis versus atypical infection in the setting of COVID-19. Electronically Signed   By: Kreg Shropshire M.D.   On: 10/28/2020 06:50

## 2020-11-04 NOTE — Progress Notes (Signed)
      [address]       [place] [address]             [phone]      5 Days Post-Op Procedure(s) (LRB): XI ROBOTIC ASSISTED THORASCOPY-WEDGE RESECTION,UPPER LOBE AND LOWER LOBE (Right) INTERCOSTAL NERVE BLOCK (Right) PLEURECTOMY (Right)   Patient failed chest tube clamping trial on Thursday of last week.  Air leak persisted and chest tube has been left to water seal.  Today CXR shows increased pneumothorax on the right.  Would place chest tube back on suction and leave on suction over the weekend.  Will repeat CXR tomorrow.    Lowella Dandy, PA-C 11/04/2020

## 2020-11-05 ENCOUNTER — Inpatient Hospital Stay (HOSPITAL_COMMUNITY): Payer: Self-pay

## 2020-11-05 DIAGNOSIS — J9383 Other pneumothorax: Secondary | ICD-10-CM

## 2020-11-05 LAB — PROCALCITONIN: Procalcitonin: <0.1 ng/mL

## 2020-11-05 LAB — CBC
HCT: 41 % (ref 39.0–52.0)
Hemoglobin: 13.5 g/dL (ref 13.0–17.0)
MCH: 30.3 pg (ref 26.0–34.0)
MCHC: 32.9 g/dL (ref 30.0–36.0)
MCV: 91.9 fL (ref 80.0–100.0)
Platelets: 333 10*3/uL (ref 150–400)
RBC: 4.46 MIL/uL (ref 4.22–5.81)
RDW: 12.2 % (ref 11.5–15.5)
WBC: 11.1 10*3/uL — ABNORMAL HIGH (ref 4.0–10.5)
nRBC: 0 % (ref 0.0–0.2)

## 2020-11-05 LAB — BASIC METABOLIC PANEL
Anion gap: 8 (ref 5–15)
BUN: 17 mg/dL (ref 6–20)
CO2: 21 mmol/L — ABNORMAL LOW (ref 22–32)
Calcium: 9.4 mg/dL (ref 8.9–10.3)
Chloride: 106 mmol/L (ref 98–111)
Creatinine, Ser: 0.86 mg/dL (ref 0.61–1.24)
GFR, Estimated: >60 mL/min (ref >60)
Glucose, Bld: 112 mg/dL — ABNORMAL HIGH (ref 70–99)
Potassium: 4.2 mmol/L (ref 3.5–5.1)
Sodium: 135 mmol/L (ref 135–145)

## 2020-11-05 LAB — LIPASE, BLOOD: Lipase: 24 U/L (ref 11–51)

## 2020-11-05 MED ORDER — LACTATED RINGERS IV SOLN: INTRAVENOUS | Status: AC

## 2020-11-05 NOTE — Progress Notes (Addendum)
Pt is awake in bed; AOX4. Vitals stable. Sats stable on room air. Denies nausea. No distress noted. Dyspnea only on exertion. Chest tube to low wall suction to right side. Dressing is clean, dry, and intact. Pt reports of pain of 10/10 in intensity in chest tube site and upper abdomen. Will medicate per MAR. Shift assessment complete. Immediate needs addressed. Bed in lowest position with call light within reach and 2 side rails up. Will continue to assess.

## 2020-11-05 NOTE — Progress Notes (Addendum)
      [address]       [place] [address]             [phone]      Chest tube placed on suction yesterday... CXR shows improvement of right pneumothorax.  Leave chest tube on suction today.  Repeat CXR in AM, if continues to improve, can hopefully trial water seal again.  Lowella Dandy, PA-C   I agree with the assessment and plan as outlined.  Purcell Nails, MD 11/05/2020 11:32 AM

## 2020-11-05 NOTE — Progress Notes (Signed)
PROGRESS NOTE                                                                                                                                                                                                             Patient Demographics:    Ronald Butler, is a 45 y.o. male, DOB - [birth year], HEN:[id number]  Outpatient Primary MD for the patient is Patient, No Pcp Per (Inactive)   Admit date - 10/28/2020   LOS - 8  Chief Complaint  Patient presents with  . Shortness of Breath    States that it hurts to breathe et that he has a collapsed upper right lung       Brief Narrative: Patient is a 45 y.o. male with PMHx of recurrent spontaneous pneumothorax-previously refused VATS-polysubstance abuse, seizure disorder-apprehended by law enforcement and incarcerated-subsequently developed chest pain and shortness of breath-was found to have right-sided pneumothorax-chest tube placed-patient was then transferred to [facility] for cardiothoracic surgery evaluation.  He was also found to have an incidental COVID-19 infection.   COVID-19 vaccinated status:   Significant Events: 4/30>> transfer to [facility] from [facility]-for CTVS evaluation of recurrent spontaneous pneumothorax 5/5>> chest tube clamped-pneumothorax reoccurred with lung collapse  Significant studies: 4/30>> CT chest: Right-sided chest tube, trace pneumothorax-emphysema with prominent subpleural bulla within the right upper/lower lobes.  COVID-19 medications: None  Antibiotics: None  Microbiology data: 4/30>> COVID PCR: Positive  Procedures: 4/30 >> right-sided chest tube placement in the emergency room at [facility] 5/2>> right thoracoscopy-wedge resection  Consults: Cardiothoracic surgery  DVT prophylaxis: apixaban (ELIQUIS) tablet 2.5 mg Start: 11/03/20 1330 Place and maintain sequential compression device Start: 11/03/20 0707 SCD's Start: 10/30/20 1642     Subjective:   Patient in bed, appears comfortable, denies any headache, no fever, +ve R. Chest tube site chest pain , no shortness of breath , no abdominal pain. No new focal weakness.    Assessment  & Plan :   Right-sided pneumothorax-s/p VATS with wedge resection on 5/2: Cardiothoracic surgery following-defer care to cardiothoracic surgery.  COVID-19 infection: Incidental-unclear how long he has this infection-he has no symptoms-apart from observation-doubt any treatment required at this time.  Note-patient refused SQ Lovenox-hence switched him to prophylactic Eliquis.  Seizure disorder: Continue Depakote, Vimpat, Zonegran.  Polysubstance abuse:  UDS checked-positive both narcotics and cocaine. Counseled.  Tobacco abuse: Refused  transdermal nicotine  Mild hypotension.  Hydrate with IV fluids and monitor.    Condition - Stable  Family Communication  : None at bedside.  Code Status :  Full Code  Diet :  Diet Order            DIET SOFT Room service appropriate? Yes; Fluid consistency: Thin  Diet effective now                  Disposition Plan  :   Status is: Inpatient  Remains inpatient appropriate because:Inpatient level of care appropriate due to severity of illness   Dispo: [facility]              Anticipated d/c is to: Prison              Patient currently is not medically stable to d/c.   Difficult to place patient No  Barriers to discharge: Chest tube in place  Antimicorbials  :    Anti-infectives (From admission, onward)   Start     Dose/Rate Route Frequency Ordered Stop   10/30/20 2130  ceFAZolin (ANCEF) IVPB 2g/100 mL premix        2 g 200 mL/hr over 30 Minutes Intravenous Every 8 hours 10/30/20 1641 10/31/20 0719      Inpatient Medications  Scheduled Meds: . acetaminophen  1,000 mg Oral Q8H  . apixaban  2.5 mg Oral BID  . bisacodyl  10 mg Oral Daily  . divalproex  500 mg Oral Daily  . divalproex  750 mg Oral QHS  .  lacosamide  100 mg Oral BID  . polyethylene glycol  17 g Oral Daily  . senna-docusate  1 tablet Oral QHS  . zonisamide  300 mg Oral QHS   Continuous Infusions:  PRN Meds:.HYDROmorphone (DILAUDID) injection, [DISCONTINUED] ondansetron **OR** ondansetron (ZOFRAN) IV, oxyCODONE   Time Spent in minutes  15  See all Orders from today for further details   Susa RaringPrashant Deem Marmol M.D on 11/05/2020 at 11:31 AM  To page go to [email or web address] - use universal password  [facility] -  Office  [phone]    Objective:   Vitals:   11/04/20 1212 11/04/20 2028 11/05/20 0133 11/05/20 0535  BP: 92/74 114/76 106/67 97/75  Pulse: 79 91 76 87  Resp: 19 19 14 14   Temp: 99.2 F (37.3 C) 99 F (37.2 C) 98.3 F (36.8 C) 98.4 F (36.9 C)  TempSrc: Oral Oral Oral Oral  SpO2: 100% 100% 100% 100%  Weight:        Wt Readings from Last 3 Encounters:  10/28/20 67.4 kg  06/19/20 74.8 kg  05/30/20 75 kg     Intake/Output Summary (Last 24 hours) at 11/05/2020 1131 Last data filed at 11/05/2020 0533 Gross per 24 hour  Intake 240 ml  Output 1200 ml  Net -960 ml     Physical Exam  Awake Alert, No new F.N deficits,   [place].AT,PERRAL Supple Neck,No JVD, No cervical lymphadenopathy appriciated.  Symmetrical Chest wall movement, Good air movement bilaterally, CTAB, R. Chest tube RRR,No Gallops, Rubs or new Murmurs, No Parasternal Heave +ve B.Sounds, Abd Soft, No tenderness, No organomegaly appriciated, No rebound - guarding or rigidity. No Cyanosis, Clubbing or edema, No new Rash or bruise     Data Review:    Recent Labs  Lab 10/31/20 0057 11/01/20 0026 11/05/20 0224  WBC 15.2* 12.7* 11.1*  HGB 12.9* 12.7* 13.5  HCT 37.8* 37.1* 41.0  PLT 337 287 333  MCV 88.1 89.2 91.9  MCH 30.1 30.5 30.3  MCHC 34.1 34.2 32.9  RDW 11.9 12.0 12.2    Recent Labs  Lab 10/30/20 0044 10/31/20 0057 11/01/20 0026 11/02/20 0301 11/05/20 0224  NA  --  136 138  --  135  K  --  3.6 3.5  --  4.2  CL  --   106 107  --  106  CO2  --  20* 23  --  21*  GLUCOSE  --  113* 116*  --  112*  BUN  --  25* 22*  --  17  CREATININE  --  1.05 0.98  --  0.86  CALCIUM  --  9.0 9.0  --  9.4  AST  --   --  14*  --   --   ALT  --   --  8  --   --   ALKPHOS  --   --  52  --   --   BILITOT  --   --  0.6  --   --   ALBUMIN  --   --  3.0*  --   --   CRP 1.0* 1.6* 4.6*  --   --   DDIMER 0.94* 1.10* 1.01* 3.15*  --        Radiology Reports CT CHEST WO CONTRAST  Result Date: 10/28/2020 CLINICAL DATA:  Pneumothorax, COVID-19 positive, chest tube EXAM: CT CHEST WITHOUT CONTRAST TECHNIQUE: Multidetector CT imaging of the chest was performed following the standard protocol without IV contrast. COMPARISON:  10/28/2020 FINDINGS: Cardiovascular: Unenhanced imaging of the heart and great vessels demonstrates no pericardial effusion. Normal caliber of the thoracic aorta. Mediastinum/Nodes: No enlarged mediastinal or axillary lymph nodes. Thyroid gland, trachea, and esophagus demonstrate no significant findings. Lungs/Pleura: There is an indwelling right-sided chest tube via a right anterolateral approach. Trace residual right basilar pneumothorax is identified, volume estimated far less than 5%. Emphysematous changes are seen, upper lobe predominant, with numerous subpleural bullous seen within the right lower and right upper lobes. Dependent consolidation within the right lower lobe consistent with atelectasis. Trace right pleural effusion. Central airways are patent. Upper Abdomen: No acute abnormality. Musculoskeletal: No acute or destructive bony lesions. Reconstructed images demonstrate no additional findings. IMPRESSION: 1. Right-sided chest tube as above, with trace residual right basilar pneumothorax volume estimated less than 5%. 2. Emphysema, with prominent subpleural bulla within the right upper and right lower lobes. 3. Minimal dependent lower lobe atelectasis, with trace right pleural effusion. Electronically Signed    By: Sharlet Salina M.D.   On: 10/28/2020 16:08   DG CHEST PORT 1 VIEW  Result Date: 11/05/2020 CLINICAL DATA:  Chest pain. Positive COVID-19. Right-sided chest tube/pneumothorax. Follow-up exam. EXAM: PORTABLE CHEST 1 VIEW COMPARISON:  11/04/2020 and older studies. FINDINGS: Significant decrease in the size of the right pneumothorax, now with only a minimal right lateral pneumothorax persisting. Stable right chest tube. Lungs are hyperexpanded, but clear. IMPRESSION: 1. Significant decrease in the right pneumothorax, with a minimal pneumothorax persisting. 2. Stable right chest tube.  Lungs remain clear. Electronically Signed   By: Amie Portland M.D.   On: 11/05/2020 09:11   DG CHEST PORT 1 VIEW  Result Date: 11/04/2020 CLINICAL DATA:  Right-sided pneumothorax with chest tube in place EXAM: PORTABLE CHEST 1 VIEW COMPARISON:  Prior chest x-[place] yesterday 11/03/2020 FINDINGS: Stable and well positioned right-sided thoracostomy tube. Persistent and slightly increased right-sided pneumothorax. Associated atelectasis in the right lower lobe. The left lung  remains clear. Cardiac and mediastinal contours remain clear. No acute osseous abnormality. IMPRESSION: Persistent and perhaps slightly enlarged right-sided pneumothorax with well-positioned chest tube in place. Electronically Signed   By: Malachy Moan M.D.   On: 11/04/2020 09:17   DG Chest Port 1 View  Result Date: 11/03/2020 CLINICAL DATA:  Pneumothorax with chest tube in place EXAM: PORTABLE CHEST 1 VIEW COMPARISON:  Nov 02, 2020 FINDINGS: Chest tube present on the right. Pneumothorax is noted on the right in the lateral and apical regions, considerably smaller compared to 1 day prior. No tension component. Postoperative change noted on the right. There are areas of scattered mild scarring in the right base. There is no edema or airspace opacity. Heart size and pulmonary vascularity are normal. No adenopathy. No bone lesions. Postoperative change noted in  lower cervical region. IMPRESSION: Chest tube remains on the right. Right pneumothorax considerably smaller compared to 1 day prior, although pneumothorax is evident in the apical and lateral aspects on the right. No tension component. Mild scarring right base. No edema or airspace opacity. Cardiac silhouette normal. Electronically Signed   By: Bretta Bang III M.D.   On: 11/03/2020 09:15   DG Chest Port 1 View  Result Date: 11/02/2020 CLINICAL DATA:  Shortness of breath, post clamping of chest tube EXAM: PORTABLE CHEST 1 VIEW COMPARISON:  Portable exam 1610 hours compared to 0629 hours FINDINGS: RIGHT thoracostomy tube unchanged. Subtotal collapse of RIGHT lung. No definite mediastinal shift. Heart size normal. LEFT lung clear. IMPRESSION: Subtotal collapse of RIGHT lung despite presence of RIGHT thoracostomy tube. No definite mediastinal shift. LEFT lung clear. Critical Value/emergent results were called by telephone at the time of interpretation on 11/02/2020 at 7:04 pm to provider Brynda Greathouse MD, who verbally acknowledged these results. Electronically Signed   By: Ulyses Southward M.D.   On: 11/02/2020 19:04   DG CHEST PORT 1 VIEW  Result Date: 11/02/2020 CLINICAL DATA:  Shortness of breath. EXAM: PORTABLE CHEST 1 VIEW COMPARISON:  11/01/2020. FINDINGS: Right chest tube in stable position. Interim increased right pneumothorax. Right pneumothorax remains small. Postsurgical changes right lung. No pleural effusion. Heart size normal. IMPRESSION: Right chest tube in stable position. Interim increase in right-sided pneumothorax. Right mid thorax remains small. Postsurgical changes right lung. Electronically Signed   By: Maisie Fus  Register   On: 11/02/2020 07:49   DG CHEST PORT 1 VIEW  Result Date: 11/01/2020 CLINICAL DATA:  Pneumothorax, chest tube, COVID-19 positive EXAM: PORTABLE CHEST 1 VIEW COMPARISON:  Portable exam 0906 hours compared to 10/31/2020 FINDINGS: RIGHT thoracostomy tube again seen. Tiny  RIGHT pneumothorax, slightly decreased. Normal heart size, mediastinal contours, and pulmonary vascularity. Lungs grossly clear. Staple line at RIGHT apex. No pleural effusion or acute osseous findings. IMPRESSION: Slight decrease in tiny RIGHT pneumothorax. Electronically Signed   By: Ulyses Southward M.D.   On: 11/01/2020 10:58   DG CHEST PORT 1 VIEW  Result Date: 10/31/2020 CLINICAL DATA:  Pneumothorax, chest tube EXAM: PORTABLE CHEST 1 VIEW COMPARISON:  Portable exam 0610 hours compared to 10/30/2020 FINDINGS: RIGHT thoracostomy tube stable. Normal heart size, mediastinal contours, and pulmonary vascularity. Persistent small RIGHT pneumothorax with adjacent RIGHT apex staple line. Remaining lungs clear. No pleural effusion or acute osseous findings. IMPRESSION: Persistent small RIGHT apex pneumothorax. Electronically Signed   By: Ulyses Southward M.D.   On: 10/31/2020 07:57   DG Chest Port 1 View  Result Date: 10/30/2020 CLINICAL DATA:  Postop lung surgery. EXAM: PORTABLE CHEST 1 VIEW COMPARISON:  10/28/2020 FINDINGS: There is a right-sided chest tube in place. No significant right-sided pneumothorax. The heart size is stable. There is no acute osseous abnormality. IMPRESSION: Chest tube in place without evidence for significant right-sided pneumothorax. Electronically Signed   By: Katherine Mantle M.D.   On: 10/30/2020 18:54   DG Chest Port 1 View  Result Date: 10/28/2020 CLINICAL DATA:  Shortness of breath with history of spontaneous pneumothorax. EXAM: PORTABLE CHEST 1 VIEW COMPARISON:  June 19, 2020 FINDINGS: A small to moderate size pneumothorax is seen along the lateral aspect of the mid and lower right hemithorax. Mild atelectasis is seen within the medial aspect of the right lung base. There is no evidence of a pleural effusion. The heart size and mediastinal contours are within normal limits. Radiopaque surgical clips are seen overlying the right upper quadrant. The visualized skeletal structures  are unremarkable. IMPRESSION: Small to moderate sized right-sided pneumothorax. Electronically Signed   By: Aram Candela M.D.   On: 10/28/2020 02:49   DG Chest Port 1V same Day  Result Date: 10/28/2020 CLINICAL DATA:  Chest tube insertion, COVID-19 positive EXAM: PORTABLE CHEST 1 VIEW COMPARISON:  Radiograph 10/28/2020 FINDINGS: Patient is post placement of a right chest tube which terminates in the mid right lung towards midline. Small residual lateral right pneumothorax and some questionable trace right effusion. No left pneumothorax or pleural fluid. Some additional coarsened interstitial opacities are present which could reflect atelectasis or atypical infection in the setting of COVID 19. Stable cardiomediastinal contours. Telemetry leads overlie the chest. IMPRESSION: Interval placement of a right chest tube with decrease in size of the previously seen right pneumothorax Small residual right pneumothorax and trace right pleural fluid remains. Some coarsened interstitial opacities, possibly atelectasis versus atypical infection in the setting of COVID-19. Electronically Signed   By: Kreg Shropshire M.D.   On: 10/28/2020 06:50

## 2020-11-06 ENCOUNTER — Inpatient Hospital Stay (HOSPITAL_COMMUNITY): Payer: Self-pay

## 2020-11-06 LAB — PROCALCITONIN: Procalcitonin: <0.1 ng/mL

## 2020-11-06 LAB — CBC WITH DIFFERENTIAL/PLATELET
Abs Immature Granulocytes: 0.04 10*3/uL (ref 0.00–0.07)
Basophils Absolute: 0 10*3/uL (ref 0.0–0.1)
Basophils Relative: 0 %
Eosinophils Absolute: 0.2 10*3/uL (ref 0.0–0.5)
Eosinophils Relative: 3 %
HCT: 34.3 % — ABNORMAL LOW (ref 39.0–52.0)
Hemoglobin: 11.6 g/dL — ABNORMAL LOW (ref 13.0–17.0)
Immature Granulocytes: 1 %
Lymphocytes Relative: 23 %
Lymphs Abs: 1.8 10*3/uL (ref 0.7–4.0)
MCH: 30.7 pg (ref 26.0–34.0)
MCHC: 33.8 g/dL (ref 30.0–36.0)
MCV: 90.7 fL (ref 80.0–100.0)
Monocytes Absolute: 0.7 10*3/uL (ref 0.1–1.0)
Monocytes Relative: 9 %
Neutro Abs: 5 10*3/uL (ref 1.7–7.7)
Neutrophils Relative %: 64 %
Platelets: 320 10*3/uL (ref 150–400)
RBC: 3.78 MIL/uL — ABNORMAL LOW (ref 4.22–5.81)
RDW: 12 % (ref 11.5–15.5)
WBC: 7.9 10*3/uL (ref 4.0–10.5)
nRBC: 0 % (ref 0.0–0.2)

## 2020-11-06 LAB — COMPREHENSIVE METABOLIC PANEL
ALT: 8 U/L (ref 0–44)
AST: 8 U/L — ABNORMAL LOW (ref 15–41)
Albumin: 2.9 g/dL — ABNORMAL LOW (ref 3.5–5.0)
Alkaline Phosphatase: 59 U/L (ref 38–126)
Anion gap: 6 (ref 5–15)
BUN: 13 mg/dL (ref 6–20)
CO2: 24 mmol/L (ref 22–32)
Calcium: 8.9 mg/dL (ref 8.9–10.3)
Chloride: 105 mmol/L (ref 98–111)
Creatinine, Ser: 0.86 mg/dL (ref 0.61–1.24)
GFR, Estimated: >60 mL/min (ref >60)
Glucose, Bld: 117 mg/dL — ABNORMAL HIGH (ref 70–99)
Potassium: 3.9 mmol/L (ref 3.5–5.1)
Sodium: 135 mmol/L (ref 135–145)
Total Bilirubin: 0.3 mg/dL (ref 0.3–1.2)
Total Protein: 6.1 g/dL — ABNORMAL LOW (ref 6.5–8.1)

## 2020-11-06 LAB — BRAIN NATRIURETIC PEPTIDE: B Natriuretic Peptide: 5.8 pg/mL (ref 0.0–100.0)

## 2020-11-06 LAB — MAGNESIUM: Magnesium: 1.8 mg/dL (ref 1.7–2.4)

## 2020-11-06 LAB — LIPASE, BLOOD: Lipase: 25 U/L (ref 11–51)

## 2020-11-06 NOTE — Plan of Care (Signed)

## 2020-11-06 NOTE — Progress Notes (Signed)
Pt is awake in bed; AOX4. Vitals stable. Sats stable on room air. Denies nausea. No distress noted. Chest tube towater sealto right side. Dressing is clean, dry, and intact. Pt reports of pain of 10/10 in intensity in chest tube site and upper abdomen. Medicated per MAR. Shift assessment complete. Immediate needs addressed. Bed in lowest position with call light within reach and 2 side rails up. Will continue to assess.

## 2020-11-06 NOTE — Progress Notes (Signed)
PROGRESS NOTE                                                                                                                                                                                                             Patient Demographics:    Ronald Butler, is a 45 y.o. male, DOB - 08-28-1975, ZOX:[id number]  Outpatient Primary MD for the patient is Patient, No Pcp Per (Inactive)   Admit date - 10/28/2020   LOS - 9  Chief Complaint  Patient presents with  . Shortness of Breath    States that it hurts to breathe et that he has a collapsed upper right lung       Brief Narrative: Patient is a 45 y.o. male with PMHx of recurrent spontaneous pneumothorax-previously refused VATS-polysubstance abuse, seizure disorder-apprehended by law enforcement and incarcerated-subsequently developed chest pain and shortness of breath-was found to have right-sided pneumothorax-chest tube placed-patient [facility] evaluation.  He was also found to have an incidental COVID-19 infection.   COVID-19 vaccinated status:   Significant Events: 4/30>> [facility]-for CTVS evaluation of recurrent spontaneous pneumothorax 5/5>> chest tube clamped-pneumothorax reoccurred with lung collapse  Significant studies: 4/30>> CT chest: Right-sided chest tube, trace pneumothorax-emphysema with prominent subpleural bulla within the right upper/lower lobes.  COVID-19 medications: None  Antibiotics: None  Microbiology data: 4/30>> COVID PCR: Positive  Procedures: 4/30 >> right-sided chest [facility] right thoracoscopy-wedge resection  Consults: Cardiothoracic surgery  DVT prophylaxis: apixaban (ELIQUIS) tablet 2.5 mg Start: 11/03/20 1330 Place and maintain sequential compression device Start: 11/03/20 0707 SCD's Start: 10/30/20 1642     Subjective:   Patient in bed, appears comfortable, denies any headache, no fever, has R. Chest pain, no shortness of breath , no abdominal pain. No new focal weakness.    Assessment  & Plan :   Right-sided pneumothorax-s/p VATS with wedge resection on 5/2: Cardiothoracic surgery following-defer care to cardiothoracic surgery.  COVID-19 infection: Incidental-unclear how long he has this infection-he has no symptoms-apart from observation-doubt any treatment required at this time.  Note-patient refused SQ Lovenox-hence switched him to prophylactic Eliquis.  Seizure disorder: Continue Depakote, Vimpat, Zonegran.  Polysubstance abuse:  UDS checked-positive both narcotics and cocaine. Counseled.  Tobacco abuse: Refused transdermal nicotine  Mild  hypotension.  Hydrated with IV fluids and monitor.    Condition - Stable  Family Communication  : None at bedside.  Code Status :  Full Code  Diet :  Diet Order            Diet regular Room service appropriate? Yes; Fluid consistency: Thin  Diet effective now                  Disposition Plan  :   Status is: Inpatient  Remains inpatient appropriate because:Inpatient level of care appropriate due to severity of illness   Dispo: The [facility] is from: Prison              Anticipated d/c is to: Prison              Patient currently is not medically stable to d/c.   Difficult to place patient No  Barriers to discharge: Chest tube in place  Antimicorbials  :    Anti-infectives (From admission, onward)   Start     Dose/Rate Route Frequency Ordered Stop   10/30/20 2130  ceFAZolin (ANCEF) IVPB 2g/100 mL premix        2 g 200 mL/hr over 30 Minutes Intravenous Every 8 hours 10/30/20 1641 10/31/20 0719      Inpatient Medications  Scheduled Meds: . acetaminophen  1,000 mg Oral Q8H  . apixaban  2.5 mg Oral BID  . bisacodyl  10 mg Oral Daily  . divalproex  500 mg Oral Daily  . divalproex  750 mg Oral QHS  . lacosamide  100 mg  Oral BID  . polyethylene glycol  17 g Oral Daily  . senna-docusate  1 tablet Oral QHS  . zonisamide  300 mg Oral QHS   Continuous Infusions:  PRN Meds:.HYDROmorphone (DILAUDID) injection, [DISCONTINUED] ondansetron **OR** ondansetron (ZOFRAN) IV, oxyCODONE   Time Spent in minutes  15  See all Orders from today for further details   Susa Raring M.D on 11/06/2020 at 9:09 AM  To page go to [email or web address] - use universal password  [facility] -  Office  [phone]    Objective:   Vitals:   11/05/20 2215 11/06/20 0032 11/06/20 0253 11/06/20 0404  BP: 119/74 105/71 100/62   Pulse: 74 69 69   Resp: 14 14 19 13   Temp:  98.4 F (36.9 C) 98.1 F (36.7 C)   TempSrc: Oral Oral Oral   SpO2: 100% 100% 100%   Weight:    67 kg    Wt Readings from Last 3 Encounters:  11/06/20 67 kg  06/19/20 74.8 kg  05/30/20 75 kg     Intake/Output Summary (Last 24 hours) at 11/06/2020 0909 Last data filed at 11/06/2020 0547 Gross per 24 hour  Intake 1159.39 ml  Output 1150 ml  Net 9.39 ml     Physical Exam  Awake Alert, No new F.N deficits, Normal affect [place].AT,PERRAL Supple Neck,No JVD, No cervical lymphadenopathy appriciated.  Symmetrical Chest wall movement, Good air movement bilaterally,R. Chest Tube RRR,No Gallops, Rubs or new Murmurs, No Parasternal Heave +ve B.Sounds, Abd Soft, No tenderness, No organomegaly appriciated, No rebound - guarding or rigidity. No Cyanosis, Clubbing or edema, No new Rash or bruise     Data Review:    Recent Labs  Lab 10/31/20 0057 11/01/20 0026 11/05/20 0224 11/06/20 0254  WBC 15.2* 12.7* 11.1* 7.9  HGB 12.9* 12.7* 13.5 11.6*  HCT 37.8* 37.1* 41.0 34.3*  PLT 337 287 333 320  MCV 88.1 89.2 91.9  90.7  MCH 30.1 30.5 30.3 30.7  MCHC 34.1 34.2 32.9 33.8  RDW 11.9 12.0 12.2 12.0  LYMPHSABS  --   --   --  1.8  MONOABS  --   --   --  0.7  EOSABS  --   --   --  0.2  BASOSABS  --   --   --  0.0    Recent Labs  Lab 10/31/20 0057  11/01/20 0026 11/02/20 0301 11/05/20 0224 11/06/20 0254  NA 136 138  --  135 135  K 3.6 3.5  --  4.2 3.9  CL 106 107  --  106 105  CO2 20* 23  --  21* 24  GLUCOSE 113* 116*  --  112* 117*  BUN 25* 22*  --  17 13  CREATININE 1.05 0.98  --  0.86 0.86  CALCIUM 9.0 9.0  --  9.4 8.9  AST  --  14*  --   --  8*  ALT  --  8  --   --  8  ALKPHOS  --  52  --   --  59  BILITOT  --  0.6  --   --  0.3  ALBUMIN  --  3.0*  --   --  2.9*  MG  --   --   --   --  1.8  CRP 1.6* 4.6*  --   --   --   DDIMER 1.10* 1.01* 3.15*  --   --   PROCALCITON  --   --   --  <0.10 <0.10  BNP  --   --   --   --  5.8       Radiology Reports CT CHEST WO CONTRAST  Result Date: 10/28/2020 CLINICAL DATA:  Pneumothorax, COVID-19 positive, chest tube EXAM: CT CHEST WITHOUT CONTRAST TECHNIQUE: Multidetector CT imaging of the chest was performed following the standard protocol without IV contrast. COMPARISON:  10/28/2020 FINDINGS: Cardiovascular: Unenhanced imaging of the heart and great vessels demonstrates no pericardial effusion. Normal caliber of the thoracic aorta. Mediastinum/Nodes: No enlarged mediastinal or axillary lymph nodes. Thyroid gland, trachea, and esophagus demonstrate no significant findings. Lungs/Pleura: There is an indwelling right-sided chest tube via a right anterolateral approach. Trace residual right basilar pneumothorax is identified, volume estimated far less than 5%. Emphysematous changes are seen, upper lobe predominant, with numerous subpleural bullous seen within the right lower and right upper lobes. Dependent consolidation within the right lower lobe consistent with atelectasis. Trace right pleural effusion. Central airways are patent. Upper Abdomen: No acute abnormality. Musculoskeletal: No acute or destructive bony lesions. Reconstructed images demonstrate no additional findings. IMPRESSION: 1. Right-sided chest tube as above, with trace residual right basilar pneumothorax volume estimated less  than 5%. 2. Emphysema, with prominent subpleural bulla within the right upper and right lower lobes. 3. Minimal dependent lower lobe atelectasis, with trace right pleural effusion. Electronically Signed   By: Sharlet Salina M.D.   On: 10/28/2020 16:08   DG CHEST PORT 1 VIEW  Result Date: 11/06/2020 CLINICAL DATA:  Pneumothorax, chest tube EXAM: PORTABLE CHEST 1 VIEW COMPARISON:  Portable exam 0627 hours compared to 11/05/2020 FINDINGS: RIGHT thoracostomy tube unchanged. Normal heart size, mediastinal contours, and pulmonary vascularity. Lungs hyperinflated but clear. No pleural effusion seen. Persistent small RIGHT pneumothorax. IMPRESSION: Persistent small RIGHT pneumothorax despite thoracostomy tube. Electronically Signed   By: Ulyses Southward M.D.   On: 11/06/2020 08:15   DG CHEST PORT 1 VIEW  Result Date: 11/05/2020 CLINICAL DATA:  Chest pain. Positive COVID-19. Right-sided chest tube/pneumothorax. Follow-up exam. EXAM: PORTABLE CHEST 1 VIEW COMPARISON:  11/04/2020 and older studies. FINDINGS: Significant decrease in the size of the right pneumothorax, now with only a minimal right lateral pneumothorax persisting. Stable right chest tube. Lungs are hyperexpanded, but clear. IMPRESSION: 1. Significant decrease in the right pneumothorax, with a minimal pneumothorax persisting. 2. Stable right chest tube.  Lungs remain clear. Electronically Signed   By: Amie Portland M.D.   On: 11/05/2020 09:11   DG CHEST PORT 1 VIEW  Result Date: 11/04/2020 CLINICAL DATA:  Right-sided pneumothorax with chest tube in place EXAM: PORTABLE CHEST 1 VIEW COMPARISON:  Prior chest x-[place] yesterday 11/03/2020 FINDINGS: Stable and well positioned right-sided thoracostomy tube. Persistent and slightly increased right-sided pneumothorax. Associated atelectasis in the right lower lobe. The left lung remains clear. Cardiac and mediastinal contours remain clear. No acute osseous abnormality. IMPRESSION: Persistent and perhaps slightly  enlarged right-sided pneumothorax with well-positioned chest tube in place. Electronically Signed   By: Malachy Moan M.D.   On: 11/04/2020 09:17   DG Chest Port 1 View  Result Date: 11/03/2020 CLINICAL DATA:  Pneumothorax with chest tube in place EXAM: PORTABLE CHEST 1 VIEW COMPARISON:  Nov 02, 2020 FINDINGS: Chest tube present on the right. Pneumothorax is noted on the right in the lateral and apical regions, considerably smaller compared to 1 day prior. No tension component. Postoperative change noted on the right. There are areas of scattered mild scarring in the right base. There is no edema or airspace opacity. Heart size and pulmonary vascularity are normal. No adenopathy. No bone lesions. Postoperative change noted in lower cervical region. IMPRESSION: Chest tube remains on the right. Right pneumothorax considerably smaller compared to 1 day prior, although pneumothorax is evident in the apical and lateral aspects on the right. No tension component. Mild scarring right base. No edema or airspace opacity. Cardiac silhouette normal. Electronically Signed   By: Bretta Bang III M.D.   On: 11/03/2020 09:15   DG Chest Port 1 View  Result Date: 11/02/2020 CLINICAL DATA:  Shortness of breath, post clamping of chest tube EXAM: PORTABLE CHEST 1 VIEW COMPARISON:  Portable exam 1610 hours compared to 0629 hours FINDINGS: RIGHT thoracostomy tube unchanged. Subtotal collapse of RIGHT lung. No definite mediastinal shift. Heart size normal. LEFT lung clear. IMPRESSION: Subtotal collapse of RIGHT lung despite presence of RIGHT thoracostomy tube. No definite mediastinal shift. LEFT lung clear. Critical Value/emergent results were called by telephone at the time of interpretation on 11/02/2020 at 7:04 pm to provider Brynda Greathouse MD, who verbally acknowledged these results. Electronically Signed   By: Ulyses Southward M.D.   On: 11/02/2020 19:04   DG CHEST PORT 1 VIEW  Result Date: 11/02/2020 CLINICAL DATA:   Shortness of breath. EXAM: PORTABLE CHEST 1 VIEW COMPARISON:  11/01/2020. FINDINGS: Right chest tube in stable position. Interim increased right pneumothorax. Right pneumothorax remains small. Postsurgical changes right lung. No pleural effusion. Heart size normal. IMPRESSION: Right chest tube in stable position. Interim increase in right-sided pneumothorax. Right mid thorax remains small. Postsurgical changes right lung. Electronically Signed   By: Maisie Fus  Register   On: 11/02/2020 07:49   DG CHEST PORT 1 VIEW  Result Date: 11/01/2020 CLINICAL DATA:  Pneumothorax, chest tube, COVID-19 positive EXAM: PORTABLE CHEST 1 VIEW COMPARISON:  Portable exam 0906 hours compared to 10/31/2020 FINDINGS: RIGHT thoracostomy tube again seen. Tiny RIGHT pneumothorax, slightly decreased. Normal heart size, mediastinal contours,  and pulmonary vascularity. Lungs grossly clear. Staple line at RIGHT apex. No pleural effusion or acute osseous findings. IMPRESSION: Slight decrease in tiny RIGHT pneumothorax. Electronically Signed   By: Ulyses Southward M.D.   On: 11/01/2020 10:58   DG CHEST PORT 1 VIEW  Result Date: 10/31/2020 CLINICAL DATA:  Pneumothorax, chest tube EXAM: PORTABLE CHEST 1 VIEW COMPARISON:  Portable exam 0610 hours compared to 10/30/2020 FINDINGS: RIGHT thoracostomy tube stable. Normal heart size, mediastinal contours, and pulmonary vascularity. Persistent small RIGHT pneumothorax with adjacent RIGHT apex staple line. Remaining lungs clear. No pleural effusion or acute osseous findings. IMPRESSION: Persistent small RIGHT apex pneumothorax. Electronically Signed   By: Ulyses Southward M.D.   On: 10/31/2020 07:57   DG Chest Port 1 View  Result Date: 10/30/2020 CLINICAL DATA:  Postop lung surgery. EXAM: PORTABLE CHEST 1 VIEW COMPARISON:  10/28/2020 FINDINGS: There is a right-sided chest tube in place. No significant right-sided pneumothorax. The heart size is stable. There is no acute osseous abnormality. IMPRESSION: Chest  tube in place without evidence for significant right-sided pneumothorax. Electronically Signed   By: Katherine Mantle M.D.   On: 10/30/2020 18:54   DG Chest Port 1 View  Result Date: 10/28/2020 CLINICAL DATA:  Shortness of breath with history of spontaneous pneumothorax. EXAM: PORTABLE CHEST 1 VIEW COMPARISON:  June 19, 2020 FINDINGS: A small to moderate size pneumothorax is seen along the lateral aspect of the mid and lower right hemithorax. Mild atelectasis is seen within the medial aspect of the right lung base. There is no evidence of a pleural effusion. The heart size and mediastinal contours are within normal limits. Radiopaque surgical clips are seen overlying the right upper quadrant. The visualized skeletal structures are unremarkable. IMPRESSION: Small to moderate sized right-sided pneumothorax. Electronically Signed   By: Aram Candela M.D.   On: 10/28/2020 02:49   DG Chest Port 1V same Day  Result Date: 10/28/2020 CLINICAL DATA:  Chest tube insertion, COVID-19 positive EXAM: PORTABLE CHEST 1 VIEW COMPARISON:  Radiograph 10/28/2020 FINDINGS: Patient is post placement of a right chest tube which terminates in the mid right lung towards midline. Small residual lateral right pneumothorax and some questionable trace right effusion. No left pneumothorax or pleural fluid. Some additional coarsened interstitial opacities are present which could reflect atelectasis or atypical infection in the setting of COVID 19. Stable cardiomediastinal contours. Telemetry leads overlie the chest. IMPRESSION: Interval placement of a right chest tube with decrease in size of the previously seen right pneumothorax Small residual right pneumothorax and trace right pleural fluid remains. Some coarsened interstitial opacities, possibly atelectasis versus atypical infection in the setting of COVID-19. Electronically Signed   By: Kreg Shropshire M.D.   On: 10/28/2020 06:50

## 2020-11-06 NOTE — Progress Notes (Signed)
Pt is awake in bed; AOX4. Vitals stable. Sats stable on room air. Denies nausea. No distress noted. Dyspnea only on exertion. Chest tube to -20 low wall suction to right side. Dressing is clean, dry, and intact. Pt reports of pain of 9/10 in intensity in chest tube site and upper abdomen. Will medicate per MAR. Shift assessment complete. Immediate needs addressed. Bed in lowest position with call light within reach and 2 side rails up. Will continue to assess. IVF infusing without complications.

## 2020-11-06 NOTE — Progress Notes (Addendum)
      [address]       [place] [address]             [phone]      Chest tube on suction for the past 2 days.  CXR shows stable small right apical  pneumothorax.  The air leak is now only 1+ with cough.  Chest tube placed back to water seal. CXR in am.    CLINICAL DATA:  Pneumothorax, chest tube  EXAM: PORTABLE CHEST 1 VIEW  COMPARISON:  Portable exam 0627 hours compared to 11/05/2020  FINDINGS: RIGHT thoracostomy tube unchanged.  Normal heart size, mediastinal contours, and pulmonary vascularity.  Lungs hyperinflated but clear.  No pleural effusion seen.  Persistent small RIGHT pneumothorax.  IMPRESSION: Persistent small RIGHT pneumothorax despite thoracostomy tube.   Electronically Signed   By: Ulyses Southward M.D.   On: 11/06/2020 08:15  Leary Roca, PA-C  [phone]   Agree with above. Chest tube to waterseal.  Corliss Skains

## 2020-11-07 ENCOUNTER — Inpatient Hospital Stay (HOSPITAL_COMMUNITY): Payer: Self-pay

## 2020-11-07 LAB — COMPREHENSIVE METABOLIC PANEL
ALT: 6 U/L (ref 0–44)
AST: 8 U/L — ABNORMAL LOW (ref 15–41)
Albumin: 2.9 g/dL — ABNORMAL LOW (ref 3.5–5.0)
Alkaline Phosphatase: 70 U/L (ref 38–126)
Anion gap: 7 (ref 5–15)
BUN: 19 mg/dL (ref 6–20)
CO2: 24 mmol/L (ref 22–32)
Calcium: 8.7 mg/dL — ABNORMAL LOW (ref 8.9–10.3)
Chloride: 103 mmol/L (ref 98–111)
Creatinine, Ser: 0.85 mg/dL (ref 0.61–1.24)
GFR, Estimated: >60 mL/min (ref >60)
Glucose, Bld: 106 mg/dL — ABNORMAL HIGH (ref 70–99)
Potassium: 3.9 mmol/L (ref 3.5–5.1)
Sodium: 134 mmol/L — ABNORMAL LOW (ref 135–145)
Total Bilirubin: 0.3 mg/dL (ref 0.3–1.2)
Total Protein: 6 g/dL — ABNORMAL LOW (ref 6.5–8.1)

## 2020-11-07 LAB — CBC WITH DIFFERENTIAL/PLATELET
Abs Immature Granulocytes: 0.05 10*3/uL (ref 0.00–0.07)
Basophils Absolute: 0.1 10*3/uL (ref 0.0–0.1)
Basophils Relative: 1 %
Eosinophils Absolute: 0.2 10*3/uL (ref 0.0–0.5)
Eosinophils Relative: 3 %
HCT: 36.2 % — ABNORMAL LOW (ref 39.0–52.0)
Hemoglobin: 12 g/dL — ABNORMAL LOW (ref 13.0–17.0)
Immature Granulocytes: 1 %
Lymphocytes Relative: 27 %
Lymphs Abs: 2.3 10*3/uL (ref 0.7–4.0)
MCH: 30 pg (ref 26.0–34.0)
MCHC: 33.1 g/dL (ref 30.0–36.0)
MCV: 90.5 fL (ref 80.0–100.0)
Monocytes Absolute: 1 10*3/uL (ref 0.1–1.0)
Monocytes Relative: 12 %
Neutro Abs: 4.7 10*3/uL (ref 1.7–7.7)
Neutrophils Relative %: 56 %
Platelets: 320 10*3/uL (ref 150–400)
RBC: 4 MIL/uL — ABNORMAL LOW (ref 4.22–5.81)
RDW: 12 % (ref 11.5–15.5)
WBC: 8.3 10*3/uL (ref 4.0–10.5)
nRBC: 0 % (ref 0.0–0.2)

## 2020-11-07 LAB — CORTISOL: Cortisol, Plasma: 11.7 ug/dL

## 2020-11-07 LAB — BRAIN NATRIURETIC PEPTIDE: B Natriuretic Peptide: 5.2 pg/mL (ref 0.0–100.0)

## 2020-11-07 LAB — MAGNESIUM: Magnesium: 1.9 mg/dL (ref 1.7–2.4)

## 2020-11-07 LAB — PROCALCITONIN: Procalcitonin: <0.1 ng/mL

## 2020-11-07 LAB — LIPASE, BLOOD: Lipase: 28 U/L (ref 11–51)

## 2020-11-07 LAB — TSH: TSH: 1.717 u[IU]/mL (ref 0.350–4.500)

## 2020-11-07 MED ORDER — LACTATED RINGERS IV BOLUS
500.0000 mL | Freq: Once | INTRAVENOUS | Status: AC
  Administered 2020-11-07: 500 mL via INTRAVENOUS

## 2020-11-07 MED ORDER — HYDROMORPHONE HCL 1 MG/ML IJ SOLN
1.0000 mg | INTRAMUSCULAR | Status: DC | PRN
  Administered 2020-11-07: 2 mg via INTRAVENOUS
  Administered 2020-11-07: 1 mg via INTRAVENOUS
  Administered 2020-11-08 – 2020-11-12 (×24): 2 mg via INTRAVENOUS
  Filled 2020-11-07: qty 2
  Filled 2020-11-07: qty 1
  Filled 2020-11-07 (×24): qty 2

## 2020-11-07 MED FILL — Fentanyl Citrate Preservative Free (PF) Inj 100 MCG/2ML: INTRAMUSCULAR | Qty: 2 | Status: AC

## 2020-11-07 NOTE — Progress Notes (Addendum)
      [address]       [name] [address]             [phone]      Chest tube placed back to water seal yesterday.  CXR shows slight increase small right apical and lateral pneumothorax.  The air leak remains 1+ with cough.  Continue water seal. CXR in am.   Leary Roca, PA-C  [phone]   Agree with above.  Will keep to waterseal Pt needs more ambulation, and incentive spirometry  Corneisha Alvi O Laylanie Kruczek

## 2020-11-07 NOTE — Progress Notes (Addendum)
PROGRESS NOTE                                                                                                                                                                                                             Patient Demographics:    Ronald Butler, is a 45 y.o. male, DOB - 04-May-1976, WUJ:[id number]  Outpatient Primary MD for the patient is Patient, No Pcp Per (Inactive)   Admit date - 10/28/2020   LOS - 10  Chief Complaint  Patient presents with  . Shortness of Breath    States that it hurts to breathe et that he has a collapsed upper right lung       Brief Narrative: Patient is a 45 y.o. male with PMHx of recurrent spontaneous pneumothorax-previously refused VATS-polysubstance abuse, seizure disorder-apprehended by law enforcement and incarcerated-subsequently developed chest pain and shortness of breath-was found to have right-sided pneumothorax-chest tube placed-patient was then transferred to [facility] for cardiothoracic surgery evaluation.  He was also found to have an incidental COVID-19 infection.   COVID-19 vaccinated status:   Significant Events: 4/30>> transfer to [facility] from [facility]-for CTVS evaluation of recurrent spontaneous pneumothorax 5/5>> chest tube clamped-pneumothorax reoccurred with lung collapse  Significant studies: 4/30>> CT chest: Right-sided chest tube, trace pneumothorax-emphysema with prominent subpleural bulla within the right upper/lower lobes.  COVID-19 medications: None  Antibiotics: None  Microbiology data: 4/30>> COVID PCR: Positive  Procedures: 4/30 >> right-sided chest tube placement in the emergency room at [facility] 5/2>> right thoracoscopy-wedge resection  Consults: Cardiothoracic surgery  DVT prophylaxis: apixaban (ELIQUIS) tablet 2.5 mg Start: 11/03/20 1330 Place and maintain sequential compression device Start: 11/03/20 0707 SCD's Start: 10/30/20 1642     Subjective:   Patient in bed, appears comfortable, denies any headache, no fever, +ve R. Chest pain , no shortness of breath , no abdominal pain. No new focal weakness.   Assessment  & Plan :   Right-sided pneumothorax-s/p VATS with wedge resection on 5/2: Cardiothoracic surgery following-defer care to cardiothoracic surgery.  COVID-19 infection: Incidental-unclear how long he has this infection-he has no symptoms-apart from observation-doubt any treatment required at this time.  Note-patient refused SQ Lovenox-hence switched him to prophylactic Eliquis.  Seizure disorder: Continue Depakote, Vimpat, Zonegran.  Polysubstance abuse:  UDS checked-positive both narcotics and cocaine. Counseled.  Tobacco abuse: Refused transdermal nicotine  Mild  hypotension.  Hydrate PRN with IV fluids and monitor.  Will check baseline TSH and random cortisol.    Condition - Stable  Family Communication  : None at bedside.  Code Status :  Full Code  Diet :  Diet Order            Diet regular Room service appropriate? Yes; Fluid consistency: Thin  Diet effective now                  Disposition Plan  :   Status is: Inpatient  Remains inpatient appropriate because:Inpatient level of care appropriate due to severity of illness   Dispo: The patient is from: Prison              Anticipated d/c is to: Prison              Patient currently is not medically stable to d/c.   Difficult to place patient No  Barriers to discharge: Chest tube in place  Antimicorbials  :    Anti-infectives (From admission, onward)   Start     Dose/Rate Route Frequency Ordered Stop   10/30/20 2130  ceFAZolin (ANCEF) IVPB 2g/100 mL premix        2 g 200 mL/hr over 30 Minutes Intravenous Every 8 hours 10/30/20 1641 10/31/20 0719      Inpatient Medications  Scheduled Meds: . acetaminophen  1,000 mg Oral Q8H  . apixaban  2.5 mg Oral BID  . bisacodyl  10 mg Oral Daily  . divalproex  500 mg Oral Daily  .  divalproex  750 mg Oral QHS  . lacosamide  100 mg Oral BID  . polyethylene glycol  17 g Oral Daily  . senna-docusate  1 tablet Oral QHS  . zonisamide  300 mg Oral QHS   Continuous Infusions:  PRN Meds:.HYDROmorphone (DILAUDID) injection, [DISCONTINUED] ondansetron **OR** ondansetron (ZOFRAN) IV, oxyCODONE   Time Spent in minutes  15  See all Orders from today for further details   Susa Raring M.D on 11/07/2020 at 11:43 AM  To page go to [email or web address] - use universal password  [facility] -  Office  [phone]    Objective:   Vitals:   11/06/20 2003 11/07/20 0014 11/07/20 0433 11/07/20 0800  BP: 98/66 105/66 (!) 109/95 90/66  Pulse: 91 90 84 77  Resp: 14 15 14 20   Temp: 98.3 F (36.8 C) 98.5 F (36.9 C) 98 F (36.7 C) 98.1 F (36.7 C)  TempSrc: Oral Oral Axillary Oral  SpO2: 100% 100% 94% 95%  Weight:        Wt Readings from Last 3 Encounters:  11/06/20 67 kg  06/19/20 74.8 kg  05/30/20 75 kg     Intake/Output Summary (Last 24 hours) at 11/07/2020 1143 Last data filed at 11/07/2020 0236 Gross per 24 hour  Intake 360 ml  Output 725 ml  Net -365 ml     Physical Exam  Awake Alert, No new F.N deficits, Normal affect [place].AT,PERRAL Supple Neck,No JVD, No cervical lymphadenopathy appriciated.  Symmetrical Chest wall movement, Good air movement bilaterally, R. Chest Tube RRR,No Gallops, Rubs or new Murmurs, No Parasternal Heave +ve B.Sounds, Abd Soft, No tenderness, No organomegaly appriciated, No rebound - guarding or rigidity. No Cyanosis, Clubbing or edema, No new Rash or bruise    Data Review:    Recent Labs  Lab 11/01/20 0026 11/05/20 0224 11/06/20 0254 11/07/20 0115  WBC 12.7* 11.1* 7.9 8.3  HGB 12.7* 13.5 11.6* 12.0*  HCT  37.1* 41.0 34.3* 36.2*  PLT 287 333 320 320  MCV 89.2 91.9 90.7 90.5  MCH 30.5 30.3 30.7 30.0  MCHC 34.2 32.9 33.8 33.1  RDW 12.0 12.2 12.0 12.0  LYMPHSABS  --   --  1.8 2.3  MONOABS  --   --  0.7 1.0  EOSABS   --   --  0.2 0.2  BASOSABS  --   --  0.0 0.1    Recent Labs  Lab 11/01/20 0026 11/02/20 0301 11/05/20 0224 11/06/20 0254 11/07/20 0115  NA 138  --  135 135 134*  K 3.5  --  4.2 3.9 3.9  CL 107  --  106 105 103  CO2 23  --  21* 24 24  GLUCOSE 116*  --  112* 117* 106*  BUN 22*  --  17 13 19   CREATININE 0.98  --  0.86 0.86 0.85  CALCIUM 9.0  --  9.4 8.9 8.7*  AST 14*  --   --  8* 8*  ALT 8  --   --  8 6  ALKPHOS 52  --   --  59 70  BILITOT 0.6  --   --  0.3 0.3  ALBUMIN 3.0*  --   --  2.9* 2.9*  MG  --   --   --  1.8 1.9  CRP 4.6*  --   --   --   --   DDIMER 1.01* 3.15*  --   --   --   PROCALCITON  --   --  <0.10 <0.10 <0.10  BNP  --   --   --  5.8 5.2       Radiology Reports CT CHEST WO CONTRAST  Result Date: 10/28/2020 CLINICAL DATA:  Pneumothorax, COVID-19 positive, chest tube EXAM: CT CHEST WITHOUT CONTRAST TECHNIQUE: Multidetector CT imaging of the chest was performed following the standard protocol without IV contrast. COMPARISON:  10/28/2020 FINDINGS: Cardiovascular: Unenhanced imaging of the heart and great vessels demonstrates no pericardial effusion. Normal caliber of the thoracic aorta. Mediastinum/Nodes: No enlarged mediastinal or axillary lymph nodes. Thyroid gland, trachea, and esophagus demonstrate no significant findings. Lungs/Pleura: There is an indwelling right-sided chest tube via a right anterolateral approach. Trace residual right basilar pneumothorax is identified, volume estimated far less than 5%. Emphysematous changes are seen, upper lobe predominant, with numerous subpleural bullous seen within the right lower and right upper lobes. Dependent consolidation within the right lower lobe consistent with atelectasis. Trace right pleural effusion. Central airways are patent. Upper Abdomen: No acute abnormality. Musculoskeletal: No acute or destructive bony lesions. Reconstructed images demonstrate no additional findings. IMPRESSION: 1. Right-sided chest tube as  above, with trace residual right basilar pneumothorax volume estimated less than 5%. 2. Emphysema, with prominent subpleural bulla within the right upper and right lower lobes. 3. Minimal dependent lower lobe atelectasis, with trace right pleural effusion. Electronically Signed   By: 10/30/2020 M.D.   On: 10/28/2020 16:08   DG CHEST PORT 1 VIEW  Result Date: 11/07/2020 CLINICAL DATA:  Pneumothorax, chest tube, COVID EXAM: PORTABLE CHEST 1 VIEW COMPARISON:  Chest x-Federico 11/07/2018 FINDINGS: The heart size and mediastinal contours are within normal limits. No focal consolidation. No pulmonary edema. No pleural effusion. Interval increase in a at least small volume right pneumothorax. Right chest tube in stable position. No left pneumothorax. No acute osseous abnormality. IMPRESSION: Interval increase in size of an at least small volume right pneumothorax with right chest tube in stable position.  Electronically Signed   By: Tish Frederickson M.D.   On: 11/07/2020 06:41   DG CHEST PORT 1 VIEW  Result Date: 11/06/2020 CLINICAL DATA:  Pneumothorax, chest tube EXAM: PORTABLE CHEST 1 VIEW COMPARISON:  Portable exam 0627 hours compared to 11/05/2020 FINDINGS: RIGHT thoracostomy tube unchanged. Normal heart size, mediastinal contours, and pulmonary vascularity. Lungs hyperinflated but clear. No pleural effusion seen. Persistent small RIGHT pneumothorax. IMPRESSION: Persistent small RIGHT pneumothorax despite thoracostomy tube. Electronically Signed   By: Ulyses Southward M.D.   On: 11/06/2020 08:15   DG CHEST PORT 1 VIEW  Result Date: 11/05/2020 CLINICAL DATA:  Chest pain. Positive COVID-19. Right-sided chest tube/pneumothorax. Follow-up exam. EXAM: PORTABLE CHEST 1 VIEW COMPARISON:  11/04/2020 and older studies. FINDINGS: Significant decrease in the size of the right pneumothorax, now with only a minimal right lateral pneumothorax persisting. Stable right chest tube. Lungs are hyperexpanded, but clear. IMPRESSION: 1.  Significant decrease in the right pneumothorax, with a minimal pneumothorax persisting. 2. Stable right chest tube.  Lungs remain clear. Electronically Signed   By: Amie Portland M.D.   On: 11/05/2020 09:11   DG CHEST PORT 1 VIEW  Result Date: 11/04/2020 CLINICAL DATA:  Right-sided pneumothorax with chest tube in place EXAM: PORTABLE CHEST 1 VIEW COMPARISON:  Prior chest x-Enrique yesterday 11/03/2020 FINDINGS: Stable and well positioned right-sided thoracostomy tube. Persistent and slightly increased right-sided pneumothorax. Associated atelectasis in the right lower lobe. The left lung remains clear. Cardiac and mediastinal contours remain clear. No acute osseous abnormality. IMPRESSION: Persistent and perhaps slightly enlarged right-sided pneumothorax with well-positioned chest tube in place. Electronically Signed   By: Malachy Moan M.D.   On: 11/04/2020 09:17   DG Chest Port 1 View  Result Date: 11/03/2020 CLINICAL DATA:  Pneumothorax with chest tube in place EXAM: PORTABLE CHEST 1 VIEW COMPARISON:  Nov 02, 2020 FINDINGS: Chest tube present on the right. Pneumothorax is noted on the right in the lateral and apical regions, considerably smaller compared to 1 day prior. No tension component. Postoperative change noted on the right. There are areas of scattered mild scarring in the right base. There is no edema or airspace opacity. Heart size and pulmonary vascularity are normal. No adenopathy. No bone lesions. Postoperative change noted in lower cervical region. IMPRESSION: Chest tube remains on the right. Right pneumothorax considerably smaller compared to 1 day prior, although pneumothorax is evident in the apical and lateral aspects on the right. No tension component. Mild scarring right base. No edema or airspace opacity. Cardiac silhouette normal. Electronically Signed   By: Bretta Bang III M.D.   On: 11/03/2020 09:15   DG Chest Port 1 View  Result Date: 11/02/2020 CLINICAL DATA:  Shortness of  breath, post clamping of chest tube EXAM: PORTABLE CHEST 1 VIEW COMPARISON:  Portable exam 1610 hours compared to 0629 hours FINDINGS: RIGHT thoracostomy tube unchanged. Subtotal collapse of RIGHT lung. No definite mediastinal shift. Heart size normal. LEFT lung clear. IMPRESSION: Subtotal collapse of RIGHT lung despite presence of RIGHT thoracostomy tube. No definite mediastinal shift. LEFT lung clear. Critical Value/emergent results were called by telephone at the time of interpretation on 11/02/2020 at 7:04 pm to provider Brynda Greathouse MD, who verbally acknowledged these results. Electronically Signed   By: Ulyses Southward M.D.   On: 11/02/2020 19:04   DG CHEST PORT 1 VIEW  Result Date: 11/02/2020 CLINICAL DATA:  Shortness of breath. EXAM: PORTABLE CHEST 1 VIEW COMPARISON:  11/01/2020. FINDINGS: Right chest tube in stable position. Interim  increased right pneumothorax. Right pneumothorax remains small. Postsurgical changes right lung. No pleural effusion. Heart size normal. IMPRESSION: Right chest tube in stable position. Interim increase in right-sided pneumothorax. Right mid thorax remains small. Postsurgical changes right lung. Electronically Signed   By: Maisie Fushomas  Register   On: 11/02/2020 07:49   DG CHEST PORT 1 VIEW  Result Date: 11/01/2020 CLINICAL DATA:  Pneumothorax, chest tube, COVID-19 positive EXAM: PORTABLE CHEST 1 VIEW COMPARISON:  Portable exam 0906 hours compared to 10/31/2020 FINDINGS: RIGHT thoracostomy tube again seen. Tiny RIGHT pneumothorax, slightly decreased. Normal heart size, mediastinal contours, and pulmonary vascularity. Lungs grossly clear. Staple line at RIGHT apex. No pleural effusion or acute osseous findings. IMPRESSION: Slight decrease in tiny RIGHT pneumothorax. Electronically Signed   By: Ulyses SouthwardMark  Boles M.D.   On: 11/01/2020 10:58   DG CHEST PORT 1 VIEW  Result Date: 10/31/2020 CLINICAL DATA:  Pneumothorax, chest tube EXAM: PORTABLE CHEST 1 VIEW COMPARISON:  Portable exam  0610 hours compared to 10/30/2020 FINDINGS: RIGHT thoracostomy tube stable. Normal heart size, mediastinal contours, and pulmonary vascularity. Persistent small RIGHT pneumothorax with adjacent RIGHT apex staple line. Remaining lungs clear. No pleural effusion or acute osseous findings. IMPRESSION: Persistent small RIGHT apex pneumothorax. Electronically Signed   By: Ulyses SouthwardMark  Boles M.D.   On: 10/31/2020 07:57   DG Chest Port 1 View  Result Date: 10/30/2020 CLINICAL DATA:  Postop lung surgery. EXAM: PORTABLE CHEST 1 VIEW COMPARISON:  10/28/2020 FINDINGS: There is a right-sided chest tube in place. No significant right-sided pneumothorax. The heart size is stable. There is no acute osseous abnormality. IMPRESSION: Chest tube in place without evidence for significant right-sided pneumothorax. Electronically Signed   By: Katherine Mantlehristopher  Green M.D.   On: 10/30/2020 18:54   DG Chest Port 1 View  Result Date: 10/28/2020 CLINICAL DATA:  Shortness of breath with history of spontaneous pneumothorax. EXAM: PORTABLE CHEST 1 VIEW COMPARISON:  June 19, 2020 FINDINGS: A small to moderate size pneumothorax is seen along the lateral aspect of the mid and lower right hemithorax. Mild atelectasis is seen within the medial aspect of the right lung base. There is no evidence of a pleural effusion. The heart size and mediastinal contours are within normal limits. Radiopaque surgical clips are seen overlying the right upper quadrant. The visualized skeletal structures are unremarkable. IMPRESSION: Small to moderate sized right-sided pneumothorax. Electronically Signed   By: Aram Candelahaddeus  Houston M.D.   On: 10/28/2020 02:49   DG Chest Port 1V same Day  Result Date: 10/28/2020 CLINICAL DATA:  Chest tube insertion, COVID-19 positive EXAM: PORTABLE CHEST 1 VIEW COMPARISON:  Radiograph 10/28/2020 FINDINGS: Patient is post placement of a right chest tube which terminates in the mid right lung towards midline. Small residual lateral right  pneumothorax and some questionable trace right effusion. No left pneumothorax or pleural fluid. Some additional coarsened interstitial opacities are present which could reflect atelectasis or atypical infection in the setting of COVID 19. Stable cardiomediastinal contours. Telemetry leads overlie the chest. IMPRESSION: Interval placement of a right chest tube with decrease in size of the previously seen right pneumothorax Small residual right pneumothorax and trace right pleural fluid remains. Some coarsened interstitial opacities, possibly atelectasis versus atypical infection in the setting of COVID-19. Electronically Signed   By: Kreg ShropshirePrice  DeHay M.D.   On: 10/28/2020 06:50

## 2020-11-07 NOTE — Progress Notes (Signed)
Physical Therapy Treatment Patient Details Name: Ronald Butler MRN: [id number] DOB: Jan 20, 1976 Today's Date: 11/07/2020    History of Present Illness 45 yo male adm 4/30 from police custody with SOB and chest pain. 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  Found incidental Covid. On 5/2 pt had wedge resection of rt upper and lower lobes, apical pleurectomy, and mechanical pleurodesis. Chest tube in place.  PMHx: pancreatitis, recurrent pneumothorax, seizures, cholecystectomy, smoker, THC    PT Comments    Pt with limited mobility due to pain. Hopeful that after chest tube out his mobility will improve significantly.    Follow Up Recommendations  Supervision - Intermittent     Equipment Recommendations  Rolling walker with 5" wheels (possibly)    Recommendations for Other Services       Precautions / Restrictions Precautions Precautions: None    Mobility  Bed Mobility Overal bed mobility: Needs Assistance Bed Mobility: Supine to Sit;Sit to Supine     Supine to sit: Supervision;HOB elevated Sit to supine: Supervision;HOB elevated   General bed mobility comments: Assist for line management. Incr time to move due to pain    Transfers Overall transfer level: Needs assistance Equipment used: Rolling walker (2 wheeled) Transfers: Sit to/from Stand Sit to Stand: Min guard         General transfer comment: Assist for lines and safety. Incr time to rise  Ambulation/Gait Ambulation/Gait assistance: Min guard Gait Distance (Feet): 2 Feet Assistive device: Rolling walker (2 wheeled);1 person hand held assist Gait Pattern/deviations: Step-to pattern;Step-through pattern;Decreased stride length;Wide base of support Gait velocity: reduced Gait velocity interpretation: <1.31 ft/sec, indicative of household ambulator General Gait Details: side stepping up side of bed   Stairs             Wheelchair Mobility    Modified Rankin (Stroke Patients  Only)       Balance Overall balance assessment: No apparent balance deficits (not formally assessed) (Pain is limiting factor)                                          Cognition Arousal/Alertness: Awake/alert Behavior During Therapy: WFL for tasks assessed/performed Overall Cognitive Status: Within Functional Limits for tasks assessed                                        Exercises      General Comments        Pertinent Vitals/Pain Pain Assessment: Faces Faces Pain Scale: Hurts whole lot Pain Location: chest tube site and incision Pain Descriptors / Indicators: Operative site guarding;Grimacing Pain Intervention(s): RN gave pain meds during session;Monitored during session    Home Living Family/patient expects to be discharged to:: [facility]                    Prior Function            PT Goals (current goals can now be found in the care plan section) Acute Rehab PT Goals Patient Stated Goal: to get pain managed Progress towards PT goals: Not progressing toward goals - comment (due to pain)    Frequency    Min 3X/week      PT Plan Current plan remains appropriate    Co-evaluation  AM-PAC PT "6 Clicks" Mobility   Outcome Measure  Help needed turning from your back to your side while in a flat bed without using bedrails?: None Help needed moving from lying on your back to sitting on the side of a flat bed without using bedrails?: A Little Help needed moving to and from a bed to a chair (including a wheelchair)?: A Little Help needed standing up from a chair using your arms (e.g., wheelchair or bedside chair)?: A Little Help needed to walk in hospital room?: A Little Help needed climbing 3-5 steps with a railing? : A Little 6 Click Score: 19    End of Session   Activity Tolerance: Patient limited by pain Patient left: in bed;with call bell/phone within reach;Other (comment) (Physicist, medical outside room) Nurse Communication: Mobility status PT Visit Diagnosis: Difficulty in walking, not elsewhere classified (R26.2);Pain Pain - Right/Left: Right Pain - part of body:  (flank)     Time: 7209-4709 PT Time Calculation (min) (ACUTE ONLY): 16 min  Charges:  $Therapeutic Activity: 8-22 mins                     [facility] PT Acute Rehabilitation Services Pager [phone] Office [phone]    Angelina Ok [facility] 11/07/2020, 5:05 PM

## 2020-11-08 ENCOUNTER — Inpatient Hospital Stay (HOSPITAL_COMMUNITY): Payer: Self-pay

## 2020-11-08 LAB — COMPREHENSIVE METABOLIC PANEL
ALT: 8 U/L (ref 0–44)
AST: 9 U/L — ABNORMAL LOW (ref 15–41)
Albumin: 3.1 g/dL — ABNORMAL LOW (ref 3.5–5.0)
Alkaline Phosphatase: 75 U/L (ref 38–126)
Anion gap: 6 (ref 5–15)
BUN: 17 mg/dL (ref 6–20)
CO2: 23 mmol/L (ref 22–32)
Calcium: 9.3 mg/dL (ref 8.9–10.3)
Chloride: 106 mmol/L (ref 98–111)
Creatinine, Ser: 0.84 mg/dL (ref 0.61–1.24)
GFR, Estimated: >60 mL/min (ref >60)
Glucose, Bld: 123 mg/dL — ABNORMAL HIGH (ref 70–99)
Potassium: 3.7 mmol/L (ref 3.5–5.1)
Sodium: 135 mmol/L (ref 135–145)
Total Bilirubin: 0.1 mg/dL — ABNORMAL LOW (ref 0.3–1.2)
Total Protein: 6.7 g/dL (ref 6.5–8.1)

## 2020-11-08 LAB — LIPASE, BLOOD: Lipase: 32 U/L (ref 11–51)

## 2020-11-08 LAB — CBC WITH DIFFERENTIAL/PLATELET
Abs Immature Granulocytes: 0.04 K/uL (ref 0.00–0.07)
Basophils Absolute: 0 K/uL (ref 0.0–0.1)
Basophils Relative: 0 %
Eosinophils Absolute: 0.2 K/uL (ref 0.0–0.5)
Eosinophils Relative: 2 %
HCT: 37.8 % — ABNORMAL LOW (ref 39.0–52.0)
Hemoglobin: 12.4 g/dL — ABNORMAL LOW (ref 13.0–17.0)
Immature Granulocytes: 0 %
Lymphocytes Relative: 20 %
Lymphs Abs: 1.9 K/uL (ref 0.7–4.0)
MCH: 30 pg (ref 26.0–34.0)
MCHC: 32.8 g/dL (ref 30.0–36.0)
MCV: 91.3 fL (ref 80.0–100.0)
Monocytes Absolute: 1 K/uL (ref 0.1–1.0)
Monocytes Relative: 11 %
Neutro Abs: 6.2 K/uL (ref 1.7–7.7)
Neutrophils Relative %: 67 %
Platelets: 368 K/uL (ref 150–400)
RBC: 4.14 MIL/uL — ABNORMAL LOW (ref 4.22–5.81)
RDW: 12 % (ref 11.5–15.5)
WBC: 9.4 K/uL (ref 4.0–10.5)
nRBC: 0 % (ref 0.0–0.2)

## 2020-11-08 LAB — PROCALCITONIN: Procalcitonin: <0.1 ng/mL

## 2020-11-08 LAB — BRAIN NATRIURETIC PEPTIDE: B Natriuretic Peptide: 5.2 pg/mL (ref 0.0–100.0)

## 2020-11-08 LAB — MAGNESIUM: Magnesium: 2 mg/dL (ref 1.7–2.4)

## 2020-11-08 MED ORDER — ENSURE SURGERY PO LIQD
237.0000 mL | Freq: Two times a day (BID) | ORAL | Status: DC
  Administered 2020-11-08 – 2020-11-30 (×9): 237 mL via ORAL
  Filled 2020-11-08 (×55): qty 237

## 2020-11-08 NOTE — Plan of Care (Signed)
°  Problem: Education: °Goal: Knowledge of General Education information will improve °Description: Including pain rating scale, medication(s)/side effects and non-pharmacologic comfort measures °Outcome: Progressing °  °Problem: Health Behavior/Discharge Planning: °Goal: Ability to manage health-related needs will improve °Outcome: Progressing °  °Problem: Clinical Measurements: °Goal: Ability to maintain clinical measurements within normal limits will improve °Outcome: Progressing °Goal: Will remain free from infection °Outcome: Progressing °  °Problem: Nutrition: °Goal: Adequate nutrition will be maintained °Outcome: Progressing °  °Problem: Coping: °Goal: Level of anxiety will decrease °Outcome: Progressing °  °Problem: Safety: °Goal: Ability to remain free from injury will improve °Outcome: Progressing °  °

## 2020-11-08 NOTE — Progress Notes (Addendum)
      [address]       [place] [address]             [phone]      9 Days Post-Op Procedure(s) (LRB): XI ROBOTIC ASSISTED THORASCOPY-WEDGE RESECTION,UPPER LOBE AND LOWER LOBE (Right) INTERCOSTAL NERVE BLOCK (Right) PLEURECTOMY (Right) Subjective: Unhappy his pain medication was changed to q4 hours.   Objective: Vital signs in last 24 hours: Temp:  [97.7 F (36.5 C)-98.7 F (37.1 C)] 97.7 F (36.5 C) (05/11 0733) Pulse Rate:  [75-95] 75 (05/11 0733) Resp:  [14-18] 16 (05/11 0616) BP: (101-120)/(70-78) 101/73 (05/11 0616) SpO2:  [95 %-99 %] 96 % (05/11 0733)  Hemodynamic parameters for last 24 hours:    Intake/Output from previous day: 05/10 0701 - 05/11 0700 In: 360 [P.O.:360] Out: 675 [Urine:675] Intake/Output this shift: No intake/output data recorded.  General appearance: alert, cooperative and no distress Heart: regular rate and rhythm, S1, S2 normal, no murmur, click, rub or gallop Lungs: clear to auscultation bilaterally Abdomen: soft, non-tender; bowel sounds normal; no masses,  no organomegaly Extremities: extremities normal, atraumatic, no cyanosis or edema Wound: clean and dry, chest tube secure  Lab Results: Recent Labs    11/07/20 0115 11/08/20 0109  WBC 8.3 9.4  HGB 12.0* 12.4*  HCT 36.2* 37.8*  PLT 320 368   BMET:  Recent Labs    11/07/20 0115 11/08/20 0109  NA 134* 135  K 3.9 3.7  CL 103 106  CO2 24 23  GLUCOSE 106* 123*  BUN 19 17  CREATININE 0.85 0.84  CALCIUM 8.7* 9.3    PT/INR: No results for input(s): LABPROT, INR in the last 72 hours. ABG No results found for: PHART, HCO3, TCO2, ACIDBASEDEF, O2SAT CBG (last 3)  No results for input(s): GLUCAP in the last 72 hours.  Assessment/Plan: S/P Procedure(s) (LRB): XI ROBOTIC ASSISTED THORASCOPY-WEDGE RESECTION,UPPER LOBE AND LOWER LOBE (Right) INTERCOSTAL NERVE BLOCK (Right) PLEURECTOMY (Right)  1. Pulm-chest tube with 4+ air leak this morning, CXR with  stable pneumo 2. CV- NSR in the 70s, BP stable 3. Renal-creatinine 0.84 4. Narcotic dependence- his dilaudid was changed from every 2 hours to every 4 hours and he is not happy.    Plan: Keep chest tube to water seal this morning. CXR in the morning. Will defer to attending physician for pain medication changes. I offered him Toradol and he wasn't interested. He stated that it "burned". Encouraged oral intake.    LOS: 11 days    Sharlene Dory 11/08/2020  Agree with above  Corliss Skains

## 2020-11-08 NOTE — Plan of Care (Signed)
°  Problem: Education: °Goal: Knowledge of General Education information will improve °Description: Including pain rating scale, medication(s)/side effects and non-pharmacologic comfort measures °Outcome: Progressing °  °Problem: Health Behavior/Discharge Planning: °Goal: Ability to manage health-related needs will improve °Outcome: Progressing °  °Problem: Clinical Measurements: °Goal: Ability to maintain clinical measurements within normal limits will improve °Outcome: Progressing °Goal: Will remain free from infection °Outcome: Progressing °Goal: Diagnostic test results will improve °Outcome: Progressing °Goal: Respiratory complications will improve °Outcome: Progressing °Goal: Cardiovascular complication will be avoided °Outcome: Progressing °  °Problem: Pain Managment: °Goal: General experience of comfort will improve °Outcome: Progressing °  °Problem: Skin Integrity: °Goal: Risk for impaired skin integrity will decrease °Outcome: Progressing °  °

## 2020-11-08 NOTE — Progress Notes (Signed)
PT Cancellation Note  Patient Details Name: Ronald Butler MRN: [id number] DOB: 06-May-1976   Cancelled Treatment:    Reason Eval/Treat Not Completed: Patient declined, no reason specified Pt refused due to pain at chest tube site.  He has been receiving pain medication.  He was able to easily long sit in bed and has been up to [facility] with nursing staff.  Continue to follow as able.  Anise Salvo, PT Acute Rehab Services Pager [phone] [facility] [phone]    Rayetta Humphrey 11/08/2020, 2:36 PM

## 2020-11-08 NOTE — Progress Notes (Signed)
PROGRESS NOTE                                                                                                                                                                                                             Patient Demographics:    Ronald Butler, is a 45 y.o. male, DOB - 06/02/1976, DPO:[id number]  Outpatient Primary MD for the patient is Patient, No Pcp Per (Inactive)   Admit date - 10/28/2020   LOS - 11  Chief Complaint  Patient presents with  . Shortness of Breath    States that it hurts to breathe et that he has a collapsed upper right lung       Brief Narrative: Patient is a 45 y.o. male with PMHx of recurrent spontaneous pneumothorax-previously refused VATS-polysubstance abuse, seizure disorder-apprehended by law enforcement and incarcerated-subsequently developed chest pain and shortness of breath-was found to have right-sided pneumothorax-chest tube placed-patient was then transferred to [facility] for cardiothoracic surgery evaluation.  He was also found to have an incidental COVID-19 infection.   COVID-19 vaccinated status:   Significant Events: 4/30>> transfer to [facility] from [facility]-for CTVS evaluation of recurrent spontaneous pneumothorax 5/5>> chest tube clamped-pneumothorax reoccurred with lung collapse  Significant studies: 4/30>> CT chest: Right-sided chest tube, trace pneumothorax-emphysema with prominent subpleural bulla within the right upper/lower lobes.  COVID-19 medications: None  Antibiotics: None  Microbiology data: 4/30>> COVID PCR: Positive  Procedures: 4/30 >> right-sided chest tube placement in the emergency room at [facility] 5/2>> right thoracoscopy-wedge resection  Consults: Cardiothoracic surgery  DVT prophylaxis: apixaban (ELIQUIS) tablet 2.5 mg Start: 11/03/20 1330 Place and maintain sequential compression device Start: 11/03/20 0707 SCD's Start: 10/30/20 1642     Subjective:   Patient in bed, appears comfortable, denies any headache, +ve R.chest pain, no shortness of breath , no abdominal pain. No new focal weakness.    Assessment  & Plan :   Right-sided pneumothorax-s/p VATS with wedge resection on 5/2: Cardiothoracic surgery following-defer care to cardiothoracic surgery. Appears comfortable, will start titrating Narcotics, definite Narcotic seeking behavior.  COVID-19 infection: Incidental-unclear how long he has this infection-he has no symptoms-apart from observation-doubt any treatment required at this time.  Note-patient refused SQ Lovenox-hence switched him to prophylactic Eliquis.  Seizure disorder: Continue Depakote, Vimpat, Zonegran.  Polysubstance abuse:  UDS checked-positive both narcotics and cocaine. Counseled.  Tobacco abuse: Refused transdermal nicotine  Mild hypotension.  Hydrate PRN with IV fluids and monitor, stable baseline TSH and random cortisol.    Condition - Stable  Family Communication  : None at bedside.  Code Status :  Full Code  Diet :  Diet Order            Diet regular Room service appropriate? Yes; Fluid consistency: Thin  Diet effective now                  Disposition Plan  :    Status is: Inpatient  Remains inpatient appropriate because:Inpatient level of care appropriate due to severity of illness   Dispo: The [facility] is from: Prison              Anticipated d/c is to: Prison              Patient currently is not medically stable to d/c.   Difficult to place patient No  Barriers to discharge: Chest tube in place  Antimicorbials  :    Anti-infectives (From admission, onward)   Start     Dose/Rate Route Frequency Ordered Stop   10/30/20 2130  ceFAZolin (ANCEF) IVPB 2g/100 mL premix        2 g 200 mL/hr over 30 Minutes Intravenous Every 8 hours 10/30/20 1641 10/31/20 0719      Inpatient Medications  Scheduled Meds: . acetaminophen  1,000 mg Oral Q8H  . apixaban  2.5 mg Oral BID   . bisacodyl  10 mg Oral Daily  . divalproex  500 mg Oral Daily  . divalproex  750 mg Oral QHS  . feeding supplement  237 mL Oral BID BM  . lacosamide  100 mg Oral BID  . polyethylene glycol  17 g Oral Daily  . senna-docusate  1 tablet Oral QHS  . zonisamide  300 mg Oral QHS   Continuous Infusions:  PRN Meds:.HYDROmorphone (DILAUDID) injection, [DISCONTINUED] ondansetron **OR** ondansetron (ZOFRAN) IV, oxyCODONE   Time Spent in minutes  15  See all Orders from today for further details   Susa Raring M.D on 11/08/2020 at 10:05 AM  To page go to [email or web address] - use universal password  [facility] -  Office  [phone]    Objective:   Vitals:   11/07/20 2006 11/07/20 2356 11/08/20 0616 11/08/20 0733  BP: 112/73 120/78 101/73   Pulse: 84 85 76 75  Resp: Temp: 98.7 F (37.1 C) 98.3 F (36.8 C) 97.7 F (36.5 C) 97.7 F (36.5 C)  TempSrc: Axillary Axillary Axillary Axillary  SpO2: 95% 96% 99% 96%  Weight:        Wt Readings from Last 3 Encounters:  11/06/20 67 kg  06/19/20 74.8 kg  05/30/20 75 kg     Intake/Output Summary (Last 24 hours) at 11/08/2020 1005 Last data filed at 11/08/2020 0355 Gross per 24 hour  Intake 240 ml  Output 675 ml  Net -435 ml     Physical Exam  Awake Alert, No new F.N deficits, in no distress [place].AT,PERRAL Supple Neck,No JVD, No cervical lymphadenopathy appriciated.  Symmetrical Chest wall movement, R. Chest Tube RRR,No Gallops, Rubs or new Murmurs, No Parasternal Heave +ve B.Sounds, Abd Soft, No tenderness, No organomegaly appriciated, No rebound - guarding or rigidity. No Cyanosis, Clubbing or edema, No new Rash or bruise     Data Review:    Recent Labs  Lab 11/05/20 0224 11/06/20 0254 11/07/20 0115 11/08/20 0109  WBC 11.1* 7.9 8.3 9.4  HGB 13.5 11.6* 12.0* 12.4*  HCT 41.0 34.3* 36.2* 37.8*  PLT 333 320 320 368  MCV 91.9 90.7 90.5 91.3  MCH 30.3 30.7 30.0 30.0  MCHC 32.9 33.8 33.1 32.8  RDW  12.2 12.0 12.0 12.0  LYMPHSABS  --  1.8 2.3 1.9  MONOABS  --  0.7 1.0 1.0  EOSABS  --  0.2 0.2 0.2  BASOSABS  --  0.0 0.1 0.0    Recent Labs  Lab 11/02/20 0301 11/05/20 0224 11/06/20 0254 11/07/20 0115 11/07/20 1234 11/08/20 0109  NA  --  135 135 134*  --  135  K  --  4.2 3.9 3.9  --  3.7  CL  --  106 105 103  --  106  CO2  --  21* 24 24  --  23  GLUCOSE  --  112* 117* 106*  --  123*  BUN  --  17 13 19   --  17  CREATININE  --  0.86 0.86 0.85  --  0.84  CALCIUM  --  9.4 8.9 8.7*  --  9.3  AST  --   --  8* 8*  --  9*  ALT  --   --  8 6  --  8  ALKPHOS  --   --  59 70  --  75  BILITOT  --   --  0.3 0.3  --  0.1*  ALBUMIN  --   --  2.9* 2.9*  --  3.1*  MG  --   --  1.8 1.9  --  2.0  DDIMER 3.15*  --   --   --   --   --   PROCALCITON  --  <0.10 <0.10 <0.10  --  <0.10  TSH  --   --   --   --  1.717  --   BNP  --   --  5.8 5.2  --  5.2       Radiology Reports CT CHEST WO CONTRAST  Result Date: 10/28/2020 CLINICAL DATA:  Pneumothorax, COVID-19 positive, chest tube EXAM: CT CHEST WITHOUT CONTRAST TECHNIQUE: Multidetector CT imaging of the chest was performed following the standard protocol without IV contrast. COMPARISON:  10/28/2020 FINDINGS: Cardiovascular: Unenhanced imaging of the heart and great vessels demonstrates no pericardial effusion. Normal caliber of the thoracic aorta. Mediastinum/Nodes: No enlarged mediastinal or axillary lymph nodes. Thyroid gland, trachea, and esophagus demonstrate no significant findings. Lungs/Pleura: There is an indwelling right-sided chest tube via a right anterolateral approach. Trace residual right basilar pneumothorax is identified, volume estimated far less than 5%. Emphysematous changes are seen, upper lobe predominant, with numerous subpleural bullous seen within the right lower and right upper lobes. Dependent consolidation within the right lower lobe consistent with atelectasis. Trace right pleural effusion. Central airways are patent. Upper  Abdomen: No acute abnormality. Musculoskeletal: No acute or destructive bony lesions. Reconstructed images demonstrate no additional findings. IMPRESSION: 1. Right-sided chest tube as above, with trace residual right basilar pneumothorax volume estimated less than 5%. 2. Emphysema, with prominent subpleural bulla within the right upper and right lower lobes. 3. Minimal dependent lower lobe atelectasis, with trace right pleural effusion. Electronically Signed   By: 10/30/2020 M.D.   On: 10/28/2020 16:08   DG CHEST PORT 1 VIEW  Result Date: 11/08/2020 CLINICAL DATA:  Pneumothorax, chest tube EXAM: PORTABLE CHEST 1 VIEW COMPARISON:  11/07/2020 FINDINGS: Right chest tube remains in place with small  right pneumothorax, stable. No confluent opacities. Left lung clear. Heart is normal size. IMPRESSION: Stable small right pneumothorax and right chest tube. Electronically Signed   By: Charlett Nose M.D.   On: 11/08/2020 08:45   DG CHEST PORT 1 VIEW  Result Date: 11/07/2020 CLINICAL DATA:  Pneumothorax, chest tube, COVID EXAM: PORTABLE CHEST 1 VIEW COMPARISON:  Chest x-[place] 11/07/2018 FINDINGS: The heart size and mediastinal contours are within normal limits. No focal consolidation. No pulmonary edema. No pleural effusion. Interval increase in a at least small volume right pneumothorax. Right chest tube in stable position. No left pneumothorax. No acute osseous abnormality. IMPRESSION: Interval increase in size of an at least small volume right pneumothorax with right chest tube in stable position. Electronically Signed   By: Tish Frederickson M.D.   On: 11/07/2020 06:41   DG CHEST PORT 1 VIEW  Result Date: 11/06/2020 CLINICAL DATA:  Pneumothorax, chest tube EXAM: PORTABLE CHEST 1 VIEW COMPARISON:  Portable exam 0627 hours compared to 11/05/2020 FINDINGS: RIGHT thoracostomy tube unchanged. Normal heart size, mediastinal contours, and pulmonary vascularity. Lungs hyperinflated but clear. No pleural effusion seen.  Persistent small RIGHT pneumothorax. IMPRESSION: Persistent small RIGHT pneumothorax despite thoracostomy tube. Electronically Signed   By: Ulyses Southward M.D.   On: 11/06/2020 08:15   DG CHEST PORT 1 VIEW  Result Date: 11/05/2020 CLINICAL DATA:  Chest pain. Positive COVID-19. Right-sided chest tube/pneumothorax. Follow-up exam. EXAM: PORTABLE CHEST 1 VIEW COMPARISON:  11/04/2020 and older studies. FINDINGS: Significant decrease in the size of the right pneumothorax, now with only a minimal right lateral pneumothorax persisting. Stable right chest tube. Lungs are hyperexpanded, but clear. IMPRESSION: 1. Significant decrease in the right pneumothorax, with a minimal pneumothorax persisting. 2. Stable right chest tube.  Lungs remain clear. Electronically Signed   By: Amie Portland M.D.   On: 11/05/2020 09:11   DG CHEST PORT 1 VIEW  Result Date: 11/04/2020 CLINICAL DATA:  Right-sided pneumothorax with chest tube in place EXAM: PORTABLE CHEST 1 VIEW COMPARISON:  Prior chest x-[place] yesterday 11/03/2020 FINDINGS: Stable and well positioned right-sided thoracostomy tube. Persistent and slightly increased right-sided pneumothorax. Associated atelectasis in the right lower lobe. The left lung remains clear. Cardiac and mediastinal contours remain clear. No acute osseous abnormality. IMPRESSION: Persistent and perhaps slightly enlarged right-sided pneumothorax with well-positioned chest tube in place. Electronically Signed   By: Malachy Moan M.D.   On: 11/04/2020 09:17   DG Chest Port 1 View  Result Date: 11/03/2020 CLINICAL DATA:  Pneumothorax with chest tube in place EXAM: PORTABLE CHEST 1 VIEW COMPARISON:  Nov 02, 2020 FINDINGS: Chest tube present on the right. Pneumothorax is noted on the right in the lateral and apical regions, considerably smaller compared to 1 day prior. No tension component. Postoperative change noted on the right. There are areas of scattered mild scarring in the right base. There is no  edema or airspace opacity. Heart size and pulmonary vascularity are normal. No adenopathy. No bone lesions. Postoperative change noted in lower cervical region. IMPRESSION: Chest tube remains on the right. Right pneumothorax considerably smaller compared to 1 day prior, although pneumothorax is evident in the apical and lateral aspects on the right. No tension component. Mild scarring right base. No edema or airspace opacity. Cardiac silhouette normal. Electronically Signed   By: Bretta Bang III M.D.   On: 11/03/2020 09:15   DG Chest Port 1 View  Result Date: 11/02/2020 CLINICAL DATA:  Shortness of breath, post clamping of chest tube EXAM:  PORTABLE CHEST 1 VIEW COMPARISON:  Portable exam 1610 hours compared to 0629 hours FINDINGS: RIGHT thoracostomy tube unchanged. Subtotal collapse of RIGHT lung. No definite mediastinal shift. Heart size normal. LEFT lung clear. IMPRESSION: Subtotal collapse of RIGHT lung despite presence of RIGHT thoracostomy tube. No definite mediastinal shift. LEFT lung clear. Critical Value/emergent results were called by telephone at the time of interpretation on 11/02/2020 at 7:04 pm to provider Brynda GreathouseHARRELL LIGHTFOOT MD, who verbally acknowledged these results. Electronically Signed   By: Ulyses SouthwardMark  Boles M.D.   On: 11/02/2020 19:04   DG CHEST PORT 1 VIEW  Result Date: 11/02/2020 CLINICAL DATA:  Shortness of breath. EXAM: PORTABLE CHEST 1 VIEW COMPARISON:  11/01/2020. FINDINGS: Right chest tube in stable position. Interim increased right pneumothorax. Right pneumothorax remains small. Postsurgical changes right lung. No pleural effusion. Heart size normal. IMPRESSION: Right chest tube in stable position. Interim increase in right-sided pneumothorax. Right mid thorax remains small. Postsurgical changes right lung. Electronically Signed   By: Maisie Fushomas  Register   On: 11/02/2020 07:49   DG CHEST PORT 1 VIEW  Result Date: 11/01/2020 CLINICAL DATA:  Pneumothorax, chest tube, COVID-19 positive  EXAM: PORTABLE CHEST 1 VIEW COMPARISON:  Portable exam 0906 hours compared to 10/31/2020 FINDINGS: RIGHT thoracostomy tube again seen. Tiny RIGHT pneumothorax, slightly decreased. Normal heart size, mediastinal contours, and pulmonary vascularity. Lungs grossly clear. Staple line at RIGHT apex. No pleural effusion or acute osseous findings. IMPRESSION: Slight decrease in tiny RIGHT pneumothorax. Electronically Signed   By: Ulyses SouthwardMark  Boles M.D.   On: 11/01/2020 10:58   DG CHEST PORT 1 VIEW  Result Date: 10/31/2020 CLINICAL DATA:  Pneumothorax, chest tube EXAM: PORTABLE CHEST 1 VIEW COMPARISON:  Portable exam 0610 hours compared to 10/30/2020 FINDINGS: RIGHT thoracostomy tube stable. Normal heart size, mediastinal contours, and pulmonary vascularity. Persistent small RIGHT pneumothorax with adjacent RIGHT apex staple line. Remaining lungs clear. No pleural effusion or acute osseous findings. IMPRESSION: Persistent small RIGHT apex pneumothorax. Electronically Signed   By: Ulyses SouthwardMark  Boles M.D.   On: 10/31/2020 07:57   DG Chest Port 1 View  Result Date: 10/30/2020 CLINICAL DATA:  Postop lung surgery. EXAM: PORTABLE CHEST 1 VIEW COMPARISON:  10/28/2020 FINDINGS: There is a right-sided chest tube in place. No significant right-sided pneumothorax. The heart size is stable. There is no acute osseous abnormality. IMPRESSION: Chest tube in place without evidence for significant right-sided pneumothorax. Electronically Signed   By: Katherine Mantlehristopher  Green M.D.   On: 10/30/2020 18:54   DG Chest Port 1 View  Result Date: 10/28/2020 CLINICAL DATA:  Shortness of breath with history of spontaneous pneumothorax. EXAM: PORTABLE CHEST 1 VIEW COMPARISON:  June 19, 2020 FINDINGS: A small to moderate size pneumothorax is seen along the lateral aspect of the mid and lower right hemithorax. Mild atelectasis is seen within the medial aspect of the right lung base. There is no evidence of a pleural effusion. The heart size and mediastinal  contours are within normal limits. Radiopaque surgical clips are seen overlying the right upper quadrant. The visualized skeletal structures are unremarkable. IMPRESSION: Small to moderate sized right-sided pneumothorax. Electronically Signed   By: Aram Candelahaddeus  Houston M.D.   On: 10/28/2020 02:49   DG Chest Port 1V same Day  Result Date: 10/28/2020 CLINICAL DATA:  Chest tube insertion, COVID-19 positive EXAM: PORTABLE CHEST 1 VIEW COMPARISON:  Radiograph 10/28/2020 FINDINGS: Patient is post placement of a right chest tube which terminates in the mid right lung towards midline. Small residual lateral right pneumothorax and  some questionable trace right effusion. No left pneumothorax or pleural fluid. Some additional coarsened interstitial opacities are present which could reflect atelectasis or atypical infection in the setting of COVID 19. Stable cardiomediastinal contours. Telemetry leads overlie the chest. IMPRESSION: Interval placement of a right chest tube with decrease in size of the previously seen right pneumothorax Small residual right pneumothorax and trace right pleural fluid remains. Some coarsened interstitial opacities, possibly atelectasis versus atypical infection in the setting of COVID-19. Electronically Signed   By: Kreg Shropshire M.D.   On: 10/28/2020 06:50

## 2020-11-09 ENCOUNTER — Inpatient Hospital Stay (HOSPITAL_COMMUNITY): Payer: Self-pay

## 2020-11-09 LAB — COMPREHENSIVE METABOLIC PANEL
ALT: 8 U/L (ref 0–44)
AST: 9 U/L — ABNORMAL LOW (ref 15–41)
Albumin: 3 g/dL — ABNORMAL LOW (ref 3.5–5.0)
Alkaline Phosphatase: 75 U/L (ref 38–126)
Anion gap: 7 (ref 5–15)
BUN: 18 mg/dL (ref 6–20)
CO2: 25 mmol/L (ref 22–32)
Calcium: 8.9 mg/dL (ref 8.9–10.3)
Chloride: 103 mmol/L (ref 98–111)
Creatinine, Ser: 0.87 mg/dL (ref 0.61–1.24)
GFR, Estimated: >60 mL/min (ref >60)
Glucose, Bld: 115 mg/dL — ABNORMAL HIGH (ref 70–99)
Potassium: 3.6 mmol/L (ref 3.5–5.1)
Sodium: 135 mmol/L (ref 135–145)
Total Bilirubin: 0.3 mg/dL (ref 0.3–1.2)
Total Protein: 6.4 g/dL — ABNORMAL LOW (ref 6.5–8.1)

## 2020-11-09 LAB — CBC WITH DIFFERENTIAL/PLATELET
Abs Immature Granulocytes: 0.04 10*3/uL (ref 0.00–0.07)
Basophils Absolute: 0 10*3/uL (ref 0.0–0.1)
Basophils Relative: 1 %
Eosinophils Absolute: 0.2 10*3/uL (ref 0.0–0.5)
Eosinophils Relative: 2 %
HCT: 34.3 % — ABNORMAL LOW (ref 39.0–52.0)
Hemoglobin: 11.3 g/dL — ABNORMAL LOW (ref 13.0–17.0)
Immature Granulocytes: 1 %
Lymphocytes Relative: 27 %
Lymphs Abs: 2.1 10*3/uL (ref 0.7–4.0)
MCH: 29.9 pg (ref 26.0–34.0)
MCHC: 32.9 g/dL (ref 30.0–36.0)
MCV: 90.7 fL (ref 80.0–100.0)
Monocytes Absolute: 1.1 10*3/uL — ABNORMAL HIGH (ref 0.1–1.0)
Monocytes Relative: 14 %
Neutro Abs: 4.3 10*3/uL (ref 1.7–7.7)
Neutrophils Relative %: 55 %
Platelets: 341 10*3/uL (ref 150–400)
RBC: 3.78 MIL/uL — ABNORMAL LOW (ref 4.22–5.81)
RDW: 12 % (ref 11.5–15.5)
WBC: 7.7 10*3/uL (ref 4.0–10.5)
nRBC: 0 % (ref 0.0–0.2)

## 2020-11-09 LAB — MAGNESIUM: Magnesium: 1.9 mg/dL (ref 1.7–2.4)

## 2020-11-09 LAB — BRAIN NATRIURETIC PEPTIDE: B Natriuretic Peptide: 3.8 pg/mL (ref 0.0–100.0)

## 2020-11-09 LAB — LIPASE, BLOOD: Lipase: 31 U/L (ref 11–51)

## 2020-11-09 LAB — PROCALCITONIN: Procalcitonin: <0.1 ng/mL

## 2020-11-09 NOTE — Plan of Care (Signed)
  Problem: Clinical Measurements: Goal: Diagnostic test results will improve Outcome: Progressing Goal: Cardiovascular complication will be avoided Outcome: Progressing   Problem: Nutrition: Goal: Adequate nutrition will be maintained Outcome: Progressing   Problem: Elimination: Goal: Will not experience complications related to bowel motility Outcome: Progressing   Problem: Safety: Goal: Ability to remain free from injury will improve Outcome: Progressing   Problem: Skin Integrity: Goal: Risk for impaired skin integrity will decrease Outcome: Progressing

## 2020-11-09 NOTE — Progress Notes (Signed)
PROGRESS NOTE                                                                                                                                                                                                             Patient Demographics:    Ronald Butler, is a 45 y.o. male, DOB - [birth year], ZOX:[id number]  Outpatient Primary MD for the patient is Patient, No Pcp Per (Inactive)   Admit date - 10/28/2020   LOS - 12  Chief Complaint  Patient presents with  . Shortness of Breath    States that it hurts to breathe et that he has a collapsed upper right lung       Brief Narrative: Patient is a 45 y.o. male with PMHx of recurrent spontaneous pneumothorax-previously refused VATS-polysubstance abuse, seizure disorder-apprehended by law enforcement and incarcerated-subsequently developed chest pain and shortness of breath-was found to have right-sided pneumothorax-chest tube placed-[facility] evaluation.  He was also found to have an incidental COVID-19 infection.   COVID-19 vaccinated status:   Significant Events: 4/30>> [facility]for CTVS evaluation of recurrent spontaneous pneumothorax 5/5>> chest tube clamped-pneumothorax reoccurred with lung collapse  Significant studies: 4/30>> CT chest: Right-sided chest tube, trace pneumothorax-emphysema with prominent subpleural bulla within the right upper/lower lobes.  COVID-19 medications: None  Antibiotics: None  Microbiology data: 4/30>> COVID PCR: Positive  Procedures: 4/30 >> right-sided chest tube [facility]>> right thoracoscopy-wedge resection  Consults: Cardiothoracic surgery  DVT prophylaxis: apixaban (ELIQUIS) tablet 2.5 mg Start: 11/03/20 1330 Place and maintain sequential compression device Start: 11/03/20 0707 SCD's Start: 10/30/20 1642     Subjective:   Patient is medically stable no distress been observed from outside the room medical regimen, will monitor complains of intense right-sided chest wall pain upon asking about more Dilaudid no abdominal pain no weakness.   Assessment  & Plan :   Right-sided pneumothorax-s/p VATS with wedge resection on 5/2: Cardiothoracic surgery following-defer care to cardiothoracic surgery. Appears comfortable, will start titrating Narcotics down gradually, definite Narcotic seeking behavior.  COVID-19 infection: Incidental-unclear how long he has this infection-he has no symptoms-apart from observation-doubt any treatment required at this time.  Note-patient refused SQ Lovenox-hence switched him to prophylactic Eliquis.  Seizure disorder: Continue Depakote, Vimpat, Zonegran.  Polysubstance abuse:  UDS checked-positive both narcotics and cocaine. Counseled.  Tobacco abuse: Refused transdermal nicotine  Mild hypotension.  Hydrate PRN with IV fluids and monitor, stable baseline TSH and random cortisol.    Condition - Stable  Family Communication  : None at bedside.  Code Status :  Full Code  Diet :  Diet Order            Diet regular Room service appropriate? Yes; Fluid consistency: Thin  Diet effective now                  Disposition Plan  :    Status is: Inpatient  Remains inpatient appropriate because:Inpatient level of care appropriate due to severity of illness   Dispo: The patient is from: Prison              Anticipated d/c is to: Prison              Patient currently is not medically stable to d/c.   Difficult to place patient No  Barriers to discharge: Chest tube in place  Antimicorbials  :    Anti-infectives (From admission, onward)   Start     Dose/Rate Route Frequency Ordered Stop   10/30/20 2130  ceFAZolin (ANCEF) IVPB 2g/100 mL premix        2 g 200 mL/hr over 30 Minutes Intravenous Every 8 hours 10/30/20 1641 10/31/20 0719      Inpatient  Medications  Scheduled Meds: . acetaminophen  1,000 mg Oral Q8H  . apixaban  2.5 mg Oral BID  . bisacodyl  10 mg Oral Daily  . divalproex  500 mg Oral Daily  . divalproex  750 mg Oral QHS  . feeding supplement  237 mL Oral BID BM  . lacosamide  100 mg Oral BID  . polyethylene glycol  17 g Oral Daily  . senna-docusate  1 tablet Oral QHS  . zonisamide  300 mg Oral QHS   Continuous Infusions:  PRN Meds:.HYDROmorphone (DILAUDID) injection, [DISCONTINUED] ondansetron **OR** ondansetron (ZOFRAN) IV, oxyCODONE   Time Spent in minutes  15  See all Orders from today for further details   Susa Raring M.D on 11/09/2020 at 11:10 AM  To page go to [email or web address] - use universal password  [facility] -  Office  [phone]    Objective:   Vitals:   11/08/20 2100 11/09/20 0000 11/09/20 0440 11/09/20 0746  BP: 115/75 109/72 107/71 104/76  Pulse: 79   70  Resp: 12 15 16    Temp: 99.1 F (37.3 C) 98.7 F (37.1 C) 98.4 F (36.9 C) 98 F (36.7 C)  TempSrc: Oral Oral Oral Oral  SpO2: 99%  98% 99%  Weight:        Wt Readings from Last 3 Encounters:  11/06/20 67 kg  06/19/20 74.8 kg  05/30/20 75 kg     Intake/Output Summary (Last 24 hours) at 11/09/2020 1110 Last data filed at 11/08/2020 2252 Gross per 24 hour  Intake 120 ml  Output 1110 ml  Net -990 ml     Physical Exam  Awake Alert, No new F.N deficits, appears to be absolutely in no distress but wanted for IV Dilaudid [place].AT,PERRAL Supple Neck,No JVD, No cervical lymphadenopathy appriciated.  Symmetrical Chest wall movement, Good air movement bilaterally, R. Chest tube RRR,No Gallops, Rubs or new Murmurs, No Parasternal Heave +ve B.Sounds, Abd Soft, No tenderness, No organomegaly appriciated, No rebound - guarding or rigidity. No Cyanosis, Clubbing or edema, No new Rash  or bruise      Data Review:    Recent Labs  Lab 11/05/20 0224 11/06/20 0254 11/07/20 0115 11/08/20 0109 11/09/20 0114  WBC 11.1*  7.9 8.3 9.4 7.7  HGB 13.5 11.6* 12.0* 12.4* 11.3*  HCT 41.0 34.3* 36.2* 37.8* 34.3*  PLT 333 320 320 368 341  MCV 91.9 90.7 90.5 91.3 90.7  MCH 30.3 30.7 30.0 30.0 29.9  MCHC 32.9 33.8 33.1 32.8 32.9  RDW 12.2 12.0 12.0 12.0 12.0  LYMPHSABS  --  1.8 2.3 1.9 2.1  MONOABS  --  0.7 1.0 1.0 1.1*  EOSABS  --  0.2 0.2 0.2 0.2  BASOSABS  --  0.0 0.1 0.0 0.0    Recent Labs  Lab 11/05/20 0224 11/06/20 0254 11/07/20 0115 11/07/20 1234 11/08/20 0109 11/09/20 0114  NA 135 135 134*  --  135 135  K 4.2 3.9 3.9  --  3.7 3.6  CL 106 105 103  --  106 103  CO2 21* 24 24  --  23 25  GLUCOSE 112* 117* 106*  --  123* 115*  BUN --  17 18  CREATININE 0.86 0.86 0.85  --  0.84 0.87  CALCIUM 9.4 8.9 8.7*  --  9.3 8.9  AST  --  8* 8*  --  9* 9*  ALT  --  8 6  --  8 8  ALKPHOS  --  59 70  --  75 75  BILITOT  --  0.3 0.3  --  0.1* 0.3  ALBUMIN  --  2.9* 2.9*  --  3.1* 3.0*  MG  --  1.8 1.9  --  2.0 1.9  PROCALCITON <0.10 <0.10 <0.10  --  <0.10 <0.10  TSH  --   --   --  1.717  --   --   BNP  --  5.8 5.2  --  5.2 3.8       Radiology Reports CT CHEST WO CONTRAST  Result Date: 10/28/2020 CLINICAL DATA:  Pneumothorax, COVID-19 positive, chest tube EXAM: CT CHEST WITHOUT CONTRAST TECHNIQUE: Multidetector CT imaging of the chest was performed following the standard protocol without IV contrast. COMPARISON:  10/28/2020 FINDINGS: Cardiovascular: Unenhanced imaging of the heart and great vessels demonstrates no pericardial effusion. Normal caliber of the thoracic aorta. Mediastinum/Nodes: No enlarged mediastinal or axillary lymph nodes. Thyroid gland, trachea, and esophagus demonstrate no significant findings. Lungs/Pleura: There is an indwelling right-sided chest tube via a right anterolateral approach. Trace residual right basilar pneumothorax is identified, volume estimated far less than 5%. Emphysematous changes are seen, upper lobe predominant, with numerous subpleural bullous seen within the  right lower and right upper lobes. Dependent consolidation within the right lower lobe consistent with atelectasis. Trace right pleural effusion. Central airways are patent. Upper Abdomen: No acute abnormality. Musculoskeletal: No acute or destructive bony lesions. Reconstructed images demonstrate no additional findings. IMPRESSION: 1. Right-sided chest tube as above, with trace residual right basilar pneumothorax volume estimated less than 5%. 2. Emphysema, with prominent subpleural bulla within the right upper and right lower lobes. 3. Minimal dependent lower lobe atelectasis, with trace right pleural effusion. Electronically Signed   By: Sharlet Salina M.D.   On: 10/28/2020 16:08   DG Chest Port 1 View  Result Date: 11/09/2020 CLINICAL DATA:  Pneumothorax Chest tube EXAM: PORTABLE CHEST 1 VIEW COMPARISON:  11/08/2020 FINDINGS: Heart size within normal limits. Small right pneumothorax similar in size to prior examination. Right apical chest tube unchanged  position. ACDF changes partially visualized in the lower cervical spine. IMPRESSION: Unchanged small right apical pneumothorax. Electronically Signed   By: Acquanetta Belling M.D.   On: 11/09/2020 07:47   DG CHEST PORT 1 VIEW  Result Date: 11/08/2020 CLINICAL DATA:  Pneumothorax, chest tube EXAM: PORTABLE CHEST 1 VIEW COMPARISON:  11/07/2020 FINDINGS: Right chest tube remains in place with small right pneumothorax, stable. No confluent opacities. Left lung clear. Heart is normal size. IMPRESSION: Stable small right pneumothorax and right chest tube. Electronically Signed   By: Charlett Nose M.D.   On: 11/08/2020 08:45   DG CHEST PORT 1 VIEW  Result Date: 11/07/2020 CLINICAL DATA:  Pneumothorax, chest tube, COVID EXAM: PORTABLE CHEST 1 VIEW COMPARISON:  Chest x-Kennth 11/07/2018 FINDINGS: The heart size and mediastinal contours are within normal limits. No focal consolidation. No pulmonary edema. No pleural effusion. Interval increase in a at least small volume  right pneumothorax. Right chest tube in stable position. No left pneumothorax. No acute osseous abnormality. IMPRESSION: Interval increase in size of an at least small volume right pneumothorax with right chest tube in stable position. Electronically Signed   By: Tish Frederickson M.D.   On: 11/07/2020 06:41   DG CHEST PORT 1 VIEW  Result Date: 11/06/2020 CLINICAL DATA:  Pneumothorax, chest tube EXAM: PORTABLE CHEST 1 VIEW COMPARISON:  Portable exam 0627 hours compared to 11/05/2020 FINDINGS: RIGHT thoracostomy tube unchanged. Normal heart size, mediastinal contours, and pulmonary vascularity. Lungs hyperinflated but clear. No pleural effusion seen. Persistent small RIGHT pneumothorax. IMPRESSION: Persistent small RIGHT pneumothorax despite thoracostomy tube. Electronically Signed   By: Ulyses Southward M.D.   On: 11/06/2020 08:15   DG CHEST PORT 1 VIEW  Result Date: 11/05/2020 CLINICAL DATA:  Chest pain. Positive COVID-19. Right-sided chest tube/pneumothorax. Follow-up exam. EXAM: PORTABLE CHEST 1 VIEW COMPARISON:  11/04/2020 and older studies. FINDINGS: Significant decrease in the size of the right pneumothorax, now with only a minimal right lateral pneumothorax persisting. Stable right chest tube. Lungs are hyperexpanded, but clear. IMPRESSION: 1. Significant decrease in the right pneumothorax, with a minimal pneumothorax persisting. 2. Stable right chest tube.  Lungs remain clear. Electronically Signed   By: Amie Portland M.D.   On: 11/05/2020 09:11   DG CHEST PORT 1 VIEW  Result Date: 11/04/2020 CLINICAL DATA:  Right-sided pneumothorax with chest tube in place EXAM: PORTABLE CHEST 1 VIEW COMPARISON:  Prior chest x-[place] yesterday 11/03/2020 FINDINGS: Stable and well positioned right-sided thoracostomy tube. Persistent and slightly increased right-sided pneumothorax. Associated atelectasis in the right lower lobe. The left lung remains clear. Cardiac and mediastinal contours remain clear. No acute osseous  abnormality. IMPRESSION: Persistent and perhaps slightly enlarged right-sided pneumothorax with well-positioned chest tube in place. Electronically Signed   By: Malachy Moan M.D.   On: 11/04/2020 09:17   DG Chest Port 1 View  Result Date: 11/03/2020 CLINICAL DATA:  Pneumothorax with chest tube in place EXAM: PORTABLE CHEST 1 VIEW COMPARISON:  Nov 02, 2020 FINDINGS: Chest tube present on the right. Pneumothorax is noted on the right in the lateral and apical regions, considerably smaller compared to 1 day prior. No tension component. Postoperative change noted on the right. There are areas of scattered mild scarring in the right base. There is no edema or airspace opacity. Heart size and pulmonary vascularity are normal. No adenopathy. No bone lesions. Postoperative change noted in lower cervical region. IMPRESSION: Chest tube remains on the right. Right pneumothorax considerably smaller compared to 1 day prior, although pneumothorax  is evident in the apical and lateral aspects on the right. No tension component. Mild scarring right base. No edema or airspace opacity. Cardiac silhouette normal. Electronically Signed   By: Bretta Bang III M.D.   On: 11/03/2020 09:15   DG Chest Port 1 View  Result Date: 11/02/2020 CLINICAL DATA:  Shortness of breath, post clamping of chest tube EXAM: PORTABLE CHEST 1 VIEW COMPARISON:  Portable exam 1610 hours compared to 0629 hours FINDINGS: RIGHT thoracostomy tube unchanged. Subtotal collapse of RIGHT lung. No definite mediastinal shift. Heart size normal. LEFT lung clear. IMPRESSION: Subtotal collapse of RIGHT lung despite presence of RIGHT thoracostomy tube. No definite mediastinal shift. LEFT lung clear. Critical Value/emergent results were called by telephone at the time of interpretation on 11/02/2020 at 7:04 pm to provider Brynda Greathouse MD, who verbally acknowledged these results. Electronically Signed   By: Ulyses Southward M.D.   On: 11/02/2020 19:04   DG CHEST  PORT 1 VIEW  Result Date: 11/02/2020 CLINICAL DATA:  Shortness of breath. EXAM: PORTABLE CHEST 1 VIEW COMPARISON:  11/01/2020. FINDINGS: Right chest tube in stable position. Interim increased right pneumothorax. Right pneumothorax remains small. Postsurgical changes right lung. No pleural effusion. Heart size normal. IMPRESSION: Right chest tube in stable position. Interim increase in right-sided pneumothorax. Right mid thorax remains small. Postsurgical changes right lung. Electronically Signed   By: Maisie Fus  Register   On: 11/02/2020 07:49   DG CHEST PORT 1 VIEW  Result Date: 11/01/2020 CLINICAL DATA:  Pneumothorax, chest tube, COVID-19 positive EXAM: PORTABLE CHEST 1 VIEW COMPARISON:  Portable exam 0906 hours compared to 10/31/2020 FINDINGS: RIGHT thoracostomy tube again seen. Tiny RIGHT pneumothorax, slightly decreased. Normal heart size, mediastinal contours, and pulmonary vascularity. Lungs grossly clear. Staple line at RIGHT apex. No pleural effusion or acute osseous findings. IMPRESSION: Slight decrease in tiny RIGHT pneumothorax. Electronically Signed   By: Ulyses Southward M.D.   On: 11/01/2020 10:58   DG CHEST PORT 1 VIEW  Result Date: 10/31/2020 CLINICAL DATA:  Pneumothorax, chest tube EXAM: PORTABLE CHEST 1 VIEW COMPARISON:  Portable exam 0610 hours compared to 10/30/2020 FINDINGS: RIGHT thoracostomy tube stable. Normal heart size, mediastinal contours, and pulmonary vascularity. Persistent small RIGHT pneumothorax with adjacent RIGHT apex staple line. Remaining lungs clear. No pleural effusion or acute osseous findings. IMPRESSION: Persistent small RIGHT apex pneumothorax. Electronically Signed   By: Ulyses Southward M.D.   On: 10/31/2020 07:57   DG Chest Port 1 View  Result Date: 10/30/2020 CLINICAL DATA:  Postop lung surgery. EXAM: PORTABLE CHEST 1 VIEW COMPARISON:  10/28/2020 FINDINGS: There is a right-sided chest tube in place. No significant right-sided pneumothorax. The heart size is stable. There  is no acute osseous abnormality. IMPRESSION: Chest tube in place without evidence for significant right-sided pneumothorax. Electronically Signed   By: Katherine Mantle M.D.   On: 10/30/2020 18:54   DG Chest Port 1 View  Result Date: 10/28/2020 CLINICAL DATA:  Shortness of breath with history of spontaneous pneumothorax. EXAM: PORTABLE CHEST 1 VIEW COMPARISON:  June 19, 2020 FINDINGS: A small to moderate size pneumothorax is seen along the lateral aspect of the mid and lower right hemithorax. Mild atelectasis is seen within the medial aspect of the right lung base. There is no evidence of a pleural effusion. The heart size and mediastinal contours are within normal limits. Radiopaque surgical clips are seen overlying the right upper quadrant. The visualized skeletal structures are unremarkable. IMPRESSION: Small to moderate sized right-sided pneumothorax. Electronically Signed  By: Aram Candela M.D.   On: 10/28/2020 02:49   DG Chest Port 1V same Day  Result Date: 10/28/2020 CLINICAL DATA:  Chest tube insertion, COVID-19 positive EXAM: PORTABLE CHEST 1 VIEW COMPARISON:  Radiograph 10/28/2020 FINDINGS: Patient is post placement of a right chest tube which terminates in the mid right lung towards midline. Small residual lateral right pneumothorax and some questionable trace right effusion. No left pneumothorax or pleural fluid. Some additional coarsened interstitial opacities are present which could reflect atelectasis or atypical infection in the setting of COVID 19. Stable cardiomediastinal contours. Telemetry leads overlie the chest. IMPRESSION: Interval placement of a right chest tube with decrease in size of the previously seen right pneumothorax Small residual right pneumothorax and trace right pleural fluid remains. Some coarsened interstitial opacities, possibly atelectasis versus atypical infection in the setting of COVID-19. Electronically Signed   By: Kreg Shropshire M.D.   On: 10/28/2020  06:50

## 2020-11-09 NOTE — TOC Progression Note (Signed)
Transition of Care [facility]) - Progression Note    Patient Details  Name: Ronald Butler MRN: [id number] Date of Birth: 1976-01-22  [facility] CM/SW Contact  Lockie Pares, RN Phone Number: 11/09/2020, 9:11 AM  Clinical Narrative:     Patient continues to have a air leak after VATS.  He is being weaned down on pain medication. He is refusing PT at times due to pain. Refusing torodol. Weaning pain medications. Chest tube still in day 11.  He will DC to [facility] once medically cleared.   Expected Discharge Plan: [facility] Barriers to Discharge: Continued Medical Work up  Expected Discharge Plan and Services Expected Discharge Plan: [employer]                                               Social Determinants of Health (SDOH) Interventions    Readmission Risk Interventions No flowsheet data found.

## 2020-11-09 NOTE — Progress Notes (Signed)
Physical Therapy Treatment Patient Details Name: Ronald Butler MRN: [id number] DOB: 08/28/1975 Today's Date: 11/09/2020    History of Present Illness 45 yo male adm 0/30 from police custody with SOB and chest pain. 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  Found incidental Covid. On 5/2 pt had wedge resection of rt upper and lower lobes, apical pleurectomy, and mechanical pleurodesis. Chest tube in place.  PMHx: pancreatitis, recurrent pneumothorax, seizures, cholecystectomy, smoker, THC    PT Comments    Patient received in bed, still c/o high pain levels and very anxious regarding mobility, but PT and RN able to work together to convince him to participate today. Tolerated progression of gait distance with RW, however very antalgic and pain limited. Became dizzy at end of gait distance, however I suspect this was possibly more related to anxiety than anything else as vitals were stable on room air. Left in bed with all needs met, RN aware of patient status. Will continue to follow.     Follow Up Recommendations  Supervision - Intermittent     Equipment Recommendations  Rolling walker with 5" wheels    Recommendations for Other Services       Precautions / Restrictions Precautions Precautions: Fall;Other (comment) Precaution Comments: chest tube on water seal Restrictions Weight Bearing Restrictions: No    Mobility  Bed Mobility Overal bed mobility: Needs Assistance Bed Mobility: Supine to Sit;Sit to Supine     Supine to sit: Supervision;HOB elevated Sit to supine: Supervision;HOB elevated   General bed mobility comments: HOB maximally elevated, assist for line management, increased time/effort    Transfers Overall transfer level: Needs assistance Equipment used: Rolling walker (2 wheeled) Transfers: Sit to/from Stand Sit to Stand: Min guard         General transfer comment: min guard, assist for line management, cues for hand  placement  Ambulation/Gait Ambulation/Gait assistance: Min guard Gait Distance (Feet): 20 Feet (61f, then 85f Assistive device: Rolling walker (2 wheeled) Gait Pattern/deviations: Step-through pattern;Trunk flexed;Decreased stride length;Antalgic Gait velocity: reduced   General Gait Details: slow and steady with RW, very antalgic gait and needed help to manage chest tube line   Stairs             Wheelchair Mobility    Modified Rankin (Stroke Patients Only)       Balance Overall balance assessment: Mild deficits observed, not formally tested Sitting-balance support: Feet supported Sitting balance-Leahy Scale: Normal     Standing balance support: Bilateral upper extremity supported;During functional activity Standing balance-Leahy Scale: Good Standing balance comment: benefits from BUE support                            Cognition Arousal/Alertness: Awake/alert Behavior During Therapy: Anxious Overall Cognitive Status: Within Functional Limits for tasks assessed                                 General Comments: very anxious regarding mobility but able to convince him to paraticipate in PT today      Exercises      General Comments General comments (skin integrity, edema, etc.): needs lots of encouragement and "soft skills" to get him to participate      Pertinent Vitals/Pain Pain Assessment: Faces Faces Pain Scale: Hurts whole lot Pain Location: chest tube site and incision Pain Descriptors / Indicators: Operative site guarding;Grimacing Pain Intervention(s): Limited activity within  patient's tolerance;Monitored during session;RN gave pain meds during session    Home Living                      Prior Function            PT Goals (current goals can now be found in the care plan section) Acute Rehab PT Goals Patient Stated Goal: to get pain managed PT Goal Formulation: With patient Time For Goal Achievement:  11/23/20 Potential to Achieve Goals: Good Progress towards PT goals: Progressing toward goals (very slowly, pain limited)    Frequency    Min 3X/week      PT Plan Current plan remains appropriate    Co-evaluation              AM-PAC PT "6 Clicks" Mobility   Outcome Measure  Help needed turning from your back to your side while in a flat bed without using bedrails?: None Help needed moving from lying on your back to sitting on the side of a flat bed without using bedrails?: A Little Help needed moving to and from a bed to a chair (including a wheelchair)?: A Little Help needed standing up from a chair using your arms (e.g., wheelchair or bedside chair)?: A Little Help needed to walk in hospital room?: A Little Help needed climbing 3-5 steps with a railing? : A Little 6 Click Score: 19    End of Session   Activity Tolerance: Patient limited by pain Patient left: in bed;with call bell/phone within reach;Other (comment) (Engineer, structural outside room) Nurse Communication: Mobility status PT Visit Diagnosis: Difficulty in walking, not elsewhere classified (R26.2);Pain Pain - Right/Left: Right Pain - part of body:  (flank)     Time: 1202-1227 PT Time Calculation (min) (ACUTE ONLY): 25 min  Charges:  $Gait Training: 8-22 mins $Therapeutic Activity: 8-22 mins                     Windell Norfolk, DPT, PN1   Supplemental Physical Therapist [place]    Pager [phone] Acute Rehab Office [phone]

## 2020-11-09 NOTE — Plan of Care (Signed)
Pt alert and oriented x4 and cooperative. VSS on room air. Pt encouraged to use incentive spirometer. Pt agreeable to work with PT today and did. Pain continues and complains of 9-10/10 pain. Pain meds given per MAR.   Problem: Education: Goal: Knowledge of General Education information will improve Description: Including pain rating scale, medication(s)/side effects and non-pharmacologic comfort measures Outcome: Progressing   Problem: Health Behavior/Discharge Planning: Goal: Ability to manage health-related needs will improve Outcome: Progressing   Problem: Clinical Measurements: Goal: Ability to maintain clinical measurements within normal limits will improve Outcome: Progressing Goal: Will remain free from infection Outcome: Progressing Goal: Diagnostic test results will improve Outcome: Progressing Goal: Respiratory complications will improve Outcome: Progressing Goal: Cardiovascular complication will be avoided Outcome: Progressing   Problem: Pain Managment: Goal: General experience of comfort will improve Outcome: Progressing

## 2020-11-09 NOTE — Progress Notes (Addendum)
      [address]       [place] [address]             [phone]      10 Days Post-Op Procedure(s) (LRB): XI ROBOTIC ASSISTED THORASCOPY-WEDGE RESECTION,UPPER LOBE AND LOWER LOBE (Right) INTERCOSTAL NERVE BLOCK (Right) PLEURECTOMY (Right) Subjective: Feels okay this morning, he is upset that he isn't getting enough pain medication and not often enough  Objective: Vital signs in last 24 hours: Temp:  [98 F (36.7 C)-99.2 F (37.3 C)] 98 F (36.7 C) (05/12 0746) Pulse Rate:  [70-79] 70 (05/12 0746) Cardiac Rhythm: Normal sinus rhythm (05/11 1900) Resp:  [10-16] 16 (05/12 0440) BP: (96-115)/(60-76) 104/76 (05/12 0746) SpO2:  [95 %-99 %] 99 % (05/12 0746)     Intake/Output from previous day: 05/11 0701 - 05/12 0700 In: 120 [P.O.:120] Out: 1110 [Urine:1100; Chest Tube:10] Intake/Output this shift: No intake/output data recorded.  General appearance: alert, cooperative and no distress Heart: regular rate and rhythm, S1, S2 normal, no murmur, click, rub or gallop Lungs: clear to auscultation bilaterally Abdomen: soft, non-tender; bowel sounds normal; no masses,  no organomegaly Extremities: extremities normal, atraumatic, no cyanosis or edema Wound: clean and dry  Lab Results: Recent Labs    11/08/20 0109 11/09/20 0114  WBC 9.4 7.7  HGB 12.4* 11.3*  HCT 37.8* 34.3*  PLT 368 341   BMET:  Recent Labs    11/08/20 0109 11/09/20 0114  NA 135 135  K 3.7 3.6  CL 106 103  CO2 23 25  GLUCOSE 123* 115*  BUN 17 18  CREATININE 0.84 0.87  CALCIUM 9.3 8.9    PT/INR: No results for input(s): LABPROT, INR in the last 72 hours. ABG No results found for: PHART, HCO3, TCO2, ACIDBASEDEF, O2SAT CBG (last 3)  No results for input(s): GLUCAP in the last 72 hours.  Assessment/Plan: S/P Procedure(s) (LRB): XI ROBOTIC ASSISTED THORASCOPY-WEDGE RESECTION,UPPER LOBE AND LOWER LOBE (Right) INTERCOSTAL NERVE BLOCK (Right) PLEURECTOMY (Right)  1.  Pulm-chest tube with 4+ air leak this morning, CXR with pneumo-maybe slightly larger on today's study 2. CV- NSR in the 70s, BP stable 3. Renal-creatinine 0.87 4. Narcotic dependence- his is unhappy that his pain medication was not given on time by the nurses.  Plan: Continue on water seal, will discuss with Dr. Cliffton Asters. CXR in the morning. Chest tube connections checked this morning and they are all tight. Might be able to come off precautions? He is day 12 from his positive COVID test. Needs more aggressive incentive spirometer.    LOS: 12 days    Sharlene Dory 11/09/2020   Agree with above CXR stable Continue pulm toilet  Terry [place]otin O Mayumi Summerson

## 2020-11-10 ENCOUNTER — Telehealth: Payer: Self-pay | Admitting: Thoracic Surgery (Cardiothoracic Vascular Surgery)

## 2020-11-10 ENCOUNTER — Inpatient Hospital Stay (HOSPITAL_COMMUNITY): Payer: Self-pay

## 2020-11-10 LAB — CBC WITH DIFFERENTIAL/PLATELET
Abs Immature Granulocytes: 0.04 10*3/uL (ref 0.00–0.07)
Basophils Absolute: 0.1 10*3/uL (ref 0.0–0.1)
Basophils Relative: 1 %
Eosinophils Absolute: 0.2 10*3/uL (ref 0.0–0.5)
Eosinophils Relative: 3 %
HCT: 34.1 % — ABNORMAL LOW (ref 39.0–52.0)
Hemoglobin: 11.3 g/dL — ABNORMAL LOW (ref 13.0–17.0)
Immature Granulocytes: 1 %
Lymphocytes Relative: 26 %
Lymphs Abs: 1.9 10*3/uL (ref 0.7–4.0)
MCH: 30.6 pg (ref 26.0–34.0)
MCHC: 33.1 g/dL (ref 30.0–36.0)
MCV: 92.4 fL (ref 80.0–100.0)
Monocytes Absolute: 1 10*3/uL (ref 0.1–1.0)
Monocytes Relative: 13 %
Neutro Abs: 4.2 10*3/uL (ref 1.7–7.7)
Neutrophils Relative %: 56 %
Platelets: 287 10*3/uL (ref 150–400)
RBC: 3.69 MIL/uL — ABNORMAL LOW (ref 4.22–5.81)
RDW: 12.1 % (ref 11.5–15.5)
WBC: 7.4 10*3/uL (ref 4.0–10.5)
nRBC: 0 % (ref 0.0–0.2)

## 2020-11-10 LAB — COMPREHENSIVE METABOLIC PANEL
ALT: 8 U/L (ref 0–44)
AST: 10 U/L — ABNORMAL LOW (ref 15–41)
Albumin: 3.1 g/dL — ABNORMAL LOW (ref 3.5–5.0)
Alkaline Phosphatase: 75 U/L (ref 38–126)
Anion gap: 10 (ref 5–15)
BUN: 20 mg/dL (ref 6–20)
CO2: 23 mmol/L (ref 22–32)
Calcium: 8.9 mg/dL (ref 8.9–10.3)
Chloride: 103 mmol/L (ref 98–111)
Creatinine, Ser: 0.89 mg/dL (ref 0.61–1.24)
GFR, Estimated: >60 mL/min (ref >60)
Glucose, Bld: 104 mg/dL — ABNORMAL HIGH (ref 70–99)
Potassium: 3.9 mmol/L (ref 3.5–5.1)
Sodium: 136 mmol/L (ref 135–145)
Total Bilirubin: 0.4 mg/dL (ref 0.3–1.2)
Total Protein: 6.1 g/dL — ABNORMAL LOW (ref 6.5–8.1)

## 2020-11-10 LAB — MAGNESIUM: Magnesium: 1.9 mg/dL (ref 1.7–2.4)

## 2020-11-10 LAB — PROCALCITONIN: Procalcitonin: <0.1 ng/mL

## 2020-11-10 LAB — LIPASE, BLOOD: Lipase: 31 U/L (ref 11–51)

## 2020-11-10 LAB — BRAIN NATRIURETIC PEPTIDE: B Natriuretic Peptide: 5.2 pg/mL (ref 0.0–100.0)

## 2020-11-10 NOTE — Progress Notes (Addendum)
      [address]       [place] [address]             [phone]      11 Days Post-Op Procedure(s) (LRB): XI ROBOTIC ASSISTED THORASCOPY-WEDGE RESECTION,UPPER LOBE AND LOWER LOBE (Right) INTERCOSTAL NERVE BLOCK (Right) PLEURECTOMY (Right) Subjective: Tearful this morning about the nursing care he received over night. He is again requesting his dilaudid pain medication to be switched to q 2 hours. He says the oxycodone is like taking a tylenol.   Objective: Vital signs in last 24 hours: Temp:  [97.8 F (36.6 C)-98.7 F (37.1 C)] 97.8 F (36.6 C) (05/13 0803) Pulse Rate:  [81-83] 83 (05/13 0803) Cardiac Rhythm: Normal sinus rhythm (05/13 0757) Resp:  [16-18] 17 (05/13 0803) BP: (98-105)/(61-72) 105/61 (05/13 0803) SpO2:  [97 %-100 %] 99 % (05/13 0803)     Intake/Output from previous day: 05/12 0701 - 05/13 0700 In: 720 [P.O.:720] Out: 2125 [Urine:2125] Intake/Output this shift: No intake/output data recorded.  General appearance: alert, cooperative and no distress Heart: regular rate and rhythm, S1, S2 normal, no murmur, click, rub or gallop Lungs: clear to auscultation bilaterally Abdomen: soft, non-tender; bowel sounds normal; no masses,  no organomegaly Extremities: extremities normal, atraumatic, no cyanosis or edema Wound: clean and dry around the chest tube  Lab Results: Recent Labs    11/09/20 0114 11/10/20 0155  WBC 7.7 7.4  HGB 11.3* 11.3*  HCT 34.3* 34.1*  PLT 341 287   BMET:  Recent Labs    11/09/20 0114 11/10/20 0155  NA 135 136  K 3.6 3.9  CL 103 103  CO2 25 23  GLUCOSE 115* 104*  BUN 18 20  CREATININE 0.87 0.89  CALCIUM 8.9 8.9    PT/INR: No results for input(s): LABPROT, INR in the last 72 hours. ABG No results found for: PHART, HCO3, TCO2, ACIDBASEDEF, O2SAT CBG (last 3)  No results for input(s): GLUCAP in the last 72 hours.  Assessment/Plan: S/P Procedure(s) (LRB): XI ROBOTIC ASSISTED THORASCOPY-WEDGE  RESECTION,UPPER LOBE AND LOWER LOBE (Right) INTERCOSTAL NERVE BLOCK (Right) PLEURECTOMY (Right)  1. Pulm-chest tube with 2+ air leak this morning on water seal, CXR with pneumo, stable. Switched him to suction.  2. CV- NSR in the 70s, BP stable 3. Renal-creatinine 0.89, electrolytes okay 4. Narcotic dependence- his is unhappy that his pain medication was not given on time by the nurses  Plan: Put the patient on 20cm of suction, he did experience some pain with this change. Ultimately Dr. Cliffton Asters would like 40cm of suction. I placed order and will asking nursing to turn up when able. I encouraged him to use his incentive spirometer hourly as well as ambulate. He ambulated once yesterday. Per the patient he does not want to do much because his pain is uncontrolled.    LOS: 13 days    Sharlene Dory 11/10/2020   Agree with above Since not ambulating, will place back to suction over the weekend  [employer]

## 2020-11-10 NOTE — Progress Notes (Signed)
PROGRESS NOTE                                                                                                                                                                                                             Patient Demographics:    Ronald Butler, is a 45 y.o. male, DOB - 04-Mar-1976, ZOX:[id number]  Outpatient Primary MD for the patient is Patient, No Pcp Per (Inactive)   Admit date - 10/28/2020   LOS - 13  Chief Complaint  Patient presents with  . Shortness of Breath    States that it hurts to breathe et that he has a collapsed upper right lung       Brief Narrative: Patient is a 45 y.o. male with PMHx of recurrent spontaneous pneumothorax-previously refused VATS-polysubstance abuse, seizure disorder-apprehended by law enforcement and incarcerated-subsequently developed chest pain and shortness of breath-was found to have right-sided pneumothorax-chest tube placed-patient was then transferred to [facility] for cardiothoracic surgery evaluation.  He was also found to have an incidental COVID-19 infection.   COVID-19 vaccinated status:   Significant Events: 4/30>> transfer to [facility] from [facility]-for CTVS evaluation of recurrent spontaneous pneumothorax 5/5>> chest tube clamped-pneumothorax reoccurred with lung collapse  Significant studies: 4/30>> CT chest: Right-sided chest tube, trace pneumothorax-emphysema with prominent subpleural bulla within the right upper/lower lobes.  COVID-19 medications: None  Antibiotics: None  Microbiology data: 4/30>> COVID PCR: Positive  Procedures: 4/30 >> right-sided chest tube placement in the emergency room at [facility] 5/2>> right thoracoscopy-wedge resection  Consults: Cardiothoracic surgery  DVT prophylaxis: apixaban (ELIQUIS) tablet 2.5 mg Start: 11/03/20 1330 Place and maintain sequential compression device Start: 11/03/20 0707 SCD's Start: 10/30/20 1642     Subjective:   Patient in bed, appears comfortable and in no distress whatsoever, still complaining of right-sided chest pain and wants narcotics to be increased, no shortness of breath or abdominal pain.   Assessment  & Plan :   Right-sided pneumothorax-s/p VATS with wedge resection on 5/2: Cardiothoracic surgery following-defer care to cardiothoracic surgery. Appears comfortable, will start titrating Narcotics down gradually, definite Narcotic seeking behavior.  COVID-19 infection: Incidental-unclear how long he has this infection-he has no symptoms-apart from observation-doubt any treatment required at this time.  Note-patient refused SQ Lovenox-hence switched him to prophylactic Eliquis.  Seizure disorder: Continue Depakote, Vimpat, Zonegran.  Polysubstance abuse:  UDS  checked-positive both narcotics and cocaine. Counseled.  Tobacco abuse: Refused transdermal nicotine  Mild hypotension.  Hydrate PRN with IV fluids and monitor, stable baseline TSH and random cortisol.    Condition - Stable  Family Communication  : None at bedside.  Code Status :  Full Code  Diet :  Diet Order            Diet regular Room service appropriate? Yes; Fluid consistency: Thin  Diet effective now                  Disposition Plan  :    Status is: Inpatient  Remains inpatient appropriate because:Inpatient level of care appropriate due to severity of illness   Dispo: The patient is from: Prison              Anticipated d/c is to: Prison              Patient currently is not medically stable to d/c.   Difficult to place patient No  Barriers to discharge: Chest tube in place  Antimicorbials  :    Anti-infectives (From admission, onward)   Start     Dose/Rate Route Frequency Ordered Stop   10/30/20 2130  ceFAZolin (ANCEF) IVPB 2g/100 mL premix        2 g 200 mL/hr over 30 Minutes Intravenous Every 8 hours 10/30/20 1641 10/31/20 0719      Inpatient Medications  Scheduled Meds: .  acetaminophen  1,000 mg Oral Q8H  . apixaban  2.5 mg Oral BID  . bisacodyl  10 mg Oral Daily  . divalproex  500 mg Oral Daily  . divalproex  750 mg Oral QHS  . feeding supplement  237 mL Oral BID BM  . lacosamide  100 mg Oral BID  . polyethylene glycol  17 g Oral Daily  . senna-docusate  1 tablet Oral QHS  . zonisamide  300 mg Oral QHS   Continuous Infusions:  PRN Meds:.HYDROmorphone (DILAUDID) injection, [DISCONTINUED] ondansetron **OR** ondansetron (ZOFRAN) IV, oxyCODONE   Time Spent in minutes  15  See all Orders from today for further details   Susa Raring M.D on 11/10/2020 at 10:53 AM  To page go to [email or web address] - use universal password  [facility] -  Office  [phone]    Objective:   Vitals:   11/09/20 2030 11/10/20 0015 11/10/20 0504 11/10/20 0803  BP: 98/65 100/63 102/70 105/61  Pulse:    83  Resp: 16 16 18 17   Temp: 98.6 F (37 C) 98.2 F (36.8 C) 98.5 F (36.9 C) 97.8 F (36.6 C)  TempSrc: Oral Oral Oral Axillary  SpO2: 98% 100% 100% 99%  Weight:        Wt Readings from Last 3 Encounters:  11/06/20 67 kg  06/19/20 74.8 kg  05/30/20 75 kg     Intake/Output Summary (Last 24 hours) at 11/10/2020 1053 Last data filed at 11/10/2020 0433 Gross per 24 hour  Intake 720 ml  Output 2125 ml  Net -1405 ml     Physical Exam  Awake Alert, No new F.N deficits, in NO DISTRESS AT ALL [place].AT,PERRAL Supple Neck,No JVD, No cervical lymphadenopathy appriciated.  Symmetrical Chest wall movement, Good air movement bilaterally, R. Chest tube RRR,No Gallops, Rubs or new Murmurs, No Parasternal Heave +ve B.Sounds, Abd Soft, No tenderness, No organomegaly appriciated, No rebound - guarding or rigidity. No Cyanosis, Clubbing or edema, No new Rash or bruise      Data Review:  Recent Labs  Lab 11/06/20 0254 11/07/20 0115 11/08/20 0109 11/09/20 0114 11/10/20 0155  WBC 7.9 8.3 9.4 7.7 7.4  HGB 11.6* 12.0* 12.4* 11.3* 11.3*  HCT 34.3* 36.2*  37.8* 34.3* 34.1*  PLT 320 320 368 341 287  MCV 90.7 90.5 91.3 90.7 92.4  MCH 30.7 30.0 30.0 29.9 30.6  MCHC 33.8 33.1 32.8 32.9 33.1  RDW 12.0 12.0 12.0 12.0 12.1  LYMPHSABS 1.8 2.3 1.9 2.1 1.9  MONOABS 0.7 1.0 1.0 1.1* 1.0  EOSABS 0.2 0.2 0.2 0.2 0.2  BASOSABS 0.0 0.1 0.0 0.0 0.1    Recent Labs  Lab 11/06/20 0254 11/07/20 0115 11/07/20 1234 11/08/20 0109 11/09/20 0114 11/10/20 0155  NA 135 134*  --  135 135 136  K 3.9 3.9  --  3.7 3.6 3.9  CL 105 103  --  106 103 103  CO2 24 24  --  GLUCOSE 117* 106*  --  123* 115* 104*  BUN 13 19  --  CREATININE 0.86 0.85  --  0.84 0.87 0.89  CALCIUM 8.9 8.7*  --  9.3 8.9 8.9  AST 8* 8*  --  9* 9* 10*  ALT 8 6  --  ALKPHOS 59 70  --  75 75 75  BILITOT 0.3 0.3  --  0.1* 0.3 0.4  ALBUMIN 2.9* 2.9*  --  3.1* 3.0* 3.1*  MG 1.8 1.9  --  2.0 1.9 1.9  PROCALCITON <0.10 <0.10  --  <0.10 <0.10 <0.10  TSH  --   --  1.717  --   --   --   BNP 5.8 5.2  --  5.2 3.8 5.2       Radiology Reports CT CHEST WO CONTRAST  Result Date: 10/28/2020 CLINICAL DATA:  Pneumothorax, COVID-19 positive, chest tube EXAM: CT CHEST WITHOUT CONTRAST TECHNIQUE: Multidetector CT imaging of the chest was performed following the standard protocol without IV contrast. COMPARISON:  10/28/2020 FINDINGS: Cardiovascular: Unenhanced imaging of the heart and great vessels demonstrates no pericardial effusion. Normal caliber of the thoracic aorta. Mediastinum/Nodes: No enlarged mediastinal or axillary lymph nodes. Thyroid gland, trachea, and esophagus demonstrate no significant findings. Lungs/Pleura: There is an indwelling right-sided chest tube via a right anterolateral approach. Trace residual right basilar pneumothorax is identified, volume estimated far less than 5%. Emphysematous changes are seen, upper lobe predominant, with numerous subpleural bullous seen within the right lower and right upper lobes. Dependent consolidation within the right lower  lobe consistent with atelectasis. Trace right pleural effusion. Central airways are patent. Upper Abdomen: No acute abnormality. Musculoskeletal: No acute or destructive bony lesions. Reconstructed images demonstrate no additional findings. IMPRESSION: 1. Right-sided chest tube as above, with trace residual right basilar pneumothorax volume estimated less than 5%. 2. Emphysema, with prominent subpleural bulla within the right upper and right lower lobes. 3. Minimal dependent lower lobe atelectasis, with trace right pleural effusion. Electronically Signed   By: Sharlet Salina M.D.   On: 10/28/2020 16:08   DG Chest Port 1 View  Result Date: 11/10/2020 CLINICAL DATA:  Chest tube.  Air leak. EXAM: PORTABLE CHEST 1 VIEW COMPARISON:  Chest x-Belvin dated 11/09/2020. FINDINGS: RIGHT-sided chest tube is stable in position with tip directed towards the RIGHT lung apex. Stable appearance of the RIGHT-sided pneumothorax, small to moderate in size. LEFT lung is clear. No pleural effusion is seen. Heart size and mediastinal contours are within normal limits. IMPRESSION: 1. Stable appearance  of the RIGHT-sided pneumothorax, small to moderate in size. Stable position of the RIGHT-sided chest tube. 2. LEFT lung is clear. Electronically Signed   By: Bary Richard M.D.   On: 11/10/2020 08:01   DG Chest Port 1 View  Result Date: 11/09/2020 CLINICAL DATA:  Pneumothorax Chest tube EXAM: PORTABLE CHEST 1 VIEW COMPARISON:  11/08/2020 FINDINGS: Heart size within normal limits. Small right pneumothorax similar in size to prior examination. Right apical chest tube unchanged position. ACDF changes partially visualized in the lower cervical spine. IMPRESSION: Unchanged small right apical pneumothorax. Electronically Signed   By: Acquanetta Belling M.D.   On: 11/09/2020 07:47   DG CHEST PORT 1 VIEW  Result Date: 11/08/2020 CLINICAL DATA:  Pneumothorax, chest tube EXAM: PORTABLE CHEST 1 VIEW COMPARISON:  11/07/2020 FINDINGS: Right chest tube  remains in place with small right pneumothorax, stable. No confluent opacities. Left lung clear. Heart is normal size. IMPRESSION: Stable small right pneumothorax and right chest tube. Electronically Signed   By: Charlett Nose M.D.   On: 11/08/2020 08:45   DG CHEST PORT 1 VIEW  Result Date: 11/07/2020 CLINICAL DATA:  Pneumothorax, chest tube, COVID EXAM: PORTABLE CHEST 1 VIEW COMPARISON:  Chest x-Dametrius 11/07/2018 FINDINGS: The heart size and mediastinal contours are within normal limits. No focal consolidation. No pulmonary edema. No pleural effusion. Interval increase in a at least small volume right pneumothorax. Right chest tube in stable position. No left pneumothorax. No acute osseous abnormality. IMPRESSION: Interval increase in size of an at least small volume right pneumothorax with right chest tube in stable position. Electronically Signed   By: Tish Frederickson M.D.   On: 11/07/2020 06:41   DG CHEST PORT 1 VIEW  Result Date: 11/06/2020 CLINICAL DATA:  Pneumothorax, chest tube EXAM: PORTABLE CHEST 1 VIEW COMPARISON:  Portable exam 0627 hours compared to 11/05/2020 FINDINGS: RIGHT thoracostomy tube unchanged. Normal heart size, mediastinal contours, and pulmonary vascularity. Lungs hyperinflated but clear. No pleural effusion seen. Persistent small RIGHT pneumothorax. IMPRESSION: Persistent small RIGHT pneumothorax despite thoracostomy tube. Electronically Signed   By: Ulyses Southward M.D.   On: 11/06/2020 08:15   DG CHEST PORT 1 VIEW  Result Date: 11/05/2020 CLINICAL DATA:  Chest pain. Positive COVID-19. Right-sided chest tube/pneumothorax. Follow-up exam. EXAM: PORTABLE CHEST 1 VIEW COMPARISON:  11/04/2020 and older studies. FINDINGS: Significant decrease in the size of the right pneumothorax, now with only a minimal right lateral pneumothorax persisting. Stable right chest tube. Lungs are hyperexpanded, but clear. IMPRESSION: 1. Significant decrease in the right pneumothorax, with a minimal pneumothorax  persisting. 2. Stable right chest tube.  Lungs remain clear. Electronically Signed   By: Amie Portland M.D.   On: 11/05/2020 09:11   DG CHEST PORT 1 VIEW  Result Date: 11/04/2020 CLINICAL DATA:  Right-sided pneumothorax with chest tube in place EXAM: PORTABLE CHEST 1 VIEW COMPARISON:  Prior chest x-Elihu yesterday 11/03/2020 FINDINGS: Stable and well positioned right-sided thoracostomy tube. Persistent and slightly increased right-sided pneumothorax. Associated atelectasis in the right lower lobe. The left lung remains clear. Cardiac and mediastinal contours remain clear. No acute osseous abnormality. IMPRESSION: Persistent and perhaps slightly enlarged right-sided pneumothorax with well-positioned chest tube in place. Electronically Signed   By: Malachy Moan M.D.   On: 11/04/2020 09:17   DG Chest Port 1 View  Result Date: 11/03/2020 CLINICAL DATA:  Pneumothorax with chest tube in place EXAM: PORTABLE CHEST 1 VIEW COMPARISON:  Nov 02, 2020 FINDINGS: Chest tube present on the right. Pneumothorax is noted  on the right in the lateral and apical regions, considerably smaller compared to 1 day prior. No tension component. Postoperative change noted on the right. There are areas of scattered mild scarring in the right base. There is no edema or airspace opacity. Heart size and pulmonary vascularity are normal. No adenopathy. No bone lesions. Postoperative change noted in lower cervical region. IMPRESSION: Chest tube remains on the right. Right pneumothorax considerably smaller compared to 1 day prior, although pneumothorax is evident in the apical and lateral aspects on the right. No tension component. Mild scarring right base. No edema or airspace opacity. Cardiac silhouette normal. Electronically Signed   By: Bretta BangWilliam  Woodruff III M.D.   On: 11/03/2020 09:15   DG Chest Port 1 View  Result Date: 11/02/2020 CLINICAL DATA:  Shortness of breath, post clamping of chest tube EXAM: PORTABLE CHEST 1 VIEW COMPARISON:   Portable exam 1610 hours compared to 0629 hours FINDINGS: RIGHT thoracostomy tube unchanged. Subtotal collapse of RIGHT lung. No definite mediastinal shift. Heart size normal. LEFT lung clear. IMPRESSION: Subtotal collapse of RIGHT lung despite presence of RIGHT thoracostomy tube. No definite mediastinal shift. LEFT lung clear. Critical Value/emergent results were called by telephone at the time of interpretation on 11/02/2020 at 7:04 pm to provider Brynda GreathouseHARRELL LIGHTFOOT MD, who verbally acknowledged these results. Electronically Signed   By: Ulyses SouthwardMark  Boles M.D.   On: 11/02/2020 19:04   DG CHEST PORT 1 VIEW  Result Date: 11/02/2020 CLINICAL DATA:  Shortness of breath. EXAM: PORTABLE CHEST 1 VIEW COMPARISON:  11/01/2020. FINDINGS: Right chest tube in stable position. Interim increased right pneumothorax. Right pneumothorax remains small. Postsurgical changes right lung. No pleural effusion. Heart size normal. IMPRESSION: Right chest tube in stable position. Interim increase in right-sided pneumothorax. Right mid thorax remains small. Postsurgical changes right lung. Electronically Signed   By: Maisie Fushomas  Register   On: 11/02/2020 07:49   DG CHEST PORT 1 VIEW  Result Date: 11/01/2020 CLINICAL DATA:  Pneumothorax, chest tube, COVID-19 positive EXAM: PORTABLE CHEST 1 VIEW COMPARISON:  Portable exam 0906 hours compared to 10/31/2020 FINDINGS: RIGHT thoracostomy tube again seen. Tiny RIGHT pneumothorax, slightly decreased. Normal heart size, mediastinal contours, and pulmonary vascularity. Lungs grossly clear. Staple line at RIGHT apex. No pleural effusion or acute osseous findings. IMPRESSION: Slight decrease in tiny RIGHT pneumothorax. Electronically Signed   By: Ulyses SouthwardMark  Boles M.D.   On: 11/01/2020 10:58   DG CHEST PORT 1 VIEW  Result Date: 10/31/2020 CLINICAL DATA:  Pneumothorax, chest tube EXAM: PORTABLE CHEST 1 VIEW COMPARISON:  Portable exam 0610 hours compared to 10/30/2020 FINDINGS: RIGHT thoracostomy tube stable.  Normal heart size, mediastinal contours, and pulmonary vascularity. Persistent small RIGHT pneumothorax with adjacent RIGHT apex staple line. Remaining lungs clear. No pleural effusion or acute osseous findings. IMPRESSION: Persistent small RIGHT apex pneumothorax. Electronically Signed   By: Ulyses SouthwardMark  Boles M.D.   On: 10/31/2020 07:57   DG Chest Port 1 View  Result Date: 10/30/2020 CLINICAL DATA:  Postop lung surgery. EXAM: PORTABLE CHEST 1 VIEW COMPARISON:  10/28/2020 FINDINGS: There is a right-sided chest tube in place. No significant right-sided pneumothorax. The heart size is stable. There is no acute osseous abnormality. IMPRESSION: Chest tube in place without evidence for significant right-sided pneumothorax. Electronically Signed   By: Katherine Mantlehristopher  Green M.D.   On: 10/30/2020 18:54   DG Chest Port 1 View  Result Date: 10/28/2020 CLINICAL DATA:  Shortness of breath with history of spontaneous pneumothorax. EXAM: PORTABLE CHEST 1 VIEW COMPARISON:  June 19, 2020 FINDINGS: A small to moderate size pneumothorax is seen along the lateral aspect of the mid and lower right hemithorax. Mild atelectasis is seen within the medial aspect of the right lung base. There is no evidence of a pleural effusion. The heart size and mediastinal contours are within normal limits. Radiopaque surgical clips are seen overlying the right upper quadrant. The visualized skeletal structures are unremarkable. IMPRESSION: Small to moderate sized right-sided pneumothorax. Electronically Signed   By: Aram Candela M.D.   On: 10/28/2020 02:49   DG Chest Port 1V same Day  Result Date: 10/28/2020 CLINICAL DATA:  Chest tube insertion, COVID-19 positive EXAM: PORTABLE CHEST 1 VIEW COMPARISON:  Radiograph 10/28/2020 FINDINGS: Patient is post placement of a right chest tube which terminates in the mid right lung towards midline. Small residual lateral right pneumothorax and some questionable trace right effusion. No left pneumothorax  or pleural fluid. Some additional coarsened interstitial opacities are present which could reflect atelectasis or atypical infection in the setting of COVID 19. Stable cardiomediastinal contours. Telemetry leads overlie the chest. IMPRESSION: Interval placement of a right chest tube with decrease in size of the previously seen right pneumothorax Small residual right pneumothorax and trace right pleural fluid remains. Some coarsened interstitial opacities, possibly atelectasis versus atypical infection in the setting of COVID-19. Electronically Signed   By: Kreg Shropshire M.D.   On: 10/28/2020 06:50

## 2020-11-10 NOTE — Plan of Care (Signed)
  Problem: Clinical Measurements: Goal: Will remain free from infection Outcome: Progressing Goal: Respiratory complications will improve Outcome: Progressing Goal: Cardiovascular complication will be avoided Outcome: Progressing   

## 2020-11-11 ENCOUNTER — Inpatient Hospital Stay (HOSPITAL_COMMUNITY): Payer: Self-pay

## 2020-11-11 LAB — COMPREHENSIVE METABOLIC PANEL
ALT: 8 U/L (ref 0–44)
AST: 8 U/L — ABNORMAL LOW (ref 15–41)
Albumin: 3 g/dL — ABNORMAL LOW (ref 3.5–5.0)
Alkaline Phosphatase: 74 U/L (ref 38–126)
Anion gap: 5 (ref 5–15)
BUN: 17 mg/dL (ref 6–20)
CO2: 26 mmol/L (ref 22–32)
Calcium: 8.8 mg/dL — ABNORMAL LOW (ref 8.9–10.3)
Chloride: 105 mmol/L (ref 98–111)
Creatinine, Ser: 0.88 mg/dL (ref 0.61–1.24)
GFR, Estimated: >60 mL/min (ref >60)
Glucose, Bld: 108 mg/dL — ABNORMAL HIGH (ref 70–99)
Potassium: 3.7 mmol/L (ref 3.5–5.1)
Sodium: 136 mmol/L (ref 135–145)
Total Bilirubin: 0.4 mg/dL (ref 0.3–1.2)
Total Protein: 6.2 g/dL — ABNORMAL LOW (ref 6.5–8.1)

## 2020-11-11 LAB — CBC WITH DIFFERENTIAL/PLATELET
Abs Immature Granulocytes: 0.02 10*3/uL (ref 0.00–0.07)
Basophils Absolute: 0.1 10*3/uL (ref 0.0–0.1)
Basophils Relative: 1 %
Eosinophils Absolute: 0.2 10*3/uL (ref 0.0–0.5)
Eosinophils Relative: 3 %
HCT: 34 % — ABNORMAL LOW (ref 39.0–52.0)
Hemoglobin: 11.2 g/dL — ABNORMAL LOW (ref 13.0–17.0)
Immature Granulocytes: 0 %
Lymphocytes Relative: 34 %
Lymphs Abs: 2.1 10*3/uL (ref 0.7–4.0)
MCH: 30.1 pg (ref 26.0–34.0)
MCHC: 32.9 g/dL (ref 30.0–36.0)
MCV: 91.4 fL (ref 80.0–100.0)
Monocytes Absolute: 0.8 10*3/uL (ref 0.1–1.0)
Monocytes Relative: 13 %
Neutro Abs: 3.1 10*3/uL (ref 1.7–7.7)
Neutrophils Relative %: 49 %
Platelets: 312 10*3/uL (ref 150–400)
RBC: 3.72 MIL/uL — ABNORMAL LOW (ref 4.22–5.81)
RDW: 12.2 % (ref 11.5–15.5)
WBC: 6.3 10*3/uL (ref 4.0–10.5)
nRBC: 0 % (ref 0.0–0.2)

## 2020-11-11 LAB — MAGNESIUM: Magnesium: 2 mg/dL (ref 1.7–2.4)

## 2020-11-11 LAB — LIPASE, BLOOD: Lipase: 27 U/L (ref 11–51)

## 2020-11-11 LAB — PROCALCITONIN: Procalcitonin: <0.1 ng/mL

## 2020-11-11 LAB — BRAIN NATRIURETIC PEPTIDE: B Natriuretic Peptide: 4.2 pg/mL (ref 0.0–100.0)

## 2020-11-11 NOTE — Progress Notes (Signed)
PROGRESS NOTE                                                                                                                                                                                                             Patient Demographics:    Ronald Butler, is a 45 y.o. male, DOB - March 22, 1976, XBJ:[id number]  Outpatient Primary MD for the patient is Patient, No Pcp Per (Inactive)   Admit date - 10/28/2020   LOS - 14  Chief Complaint  Patient presents with  . Shortness of Breath    States that it hurts to breathe et that he has a collapsed upper right lung       Brief Narrative: Patient is a 45 y.o. male with PMHx of recurrent spontaneous pneumothorax-previously refused VATS-polysubstance abuse, seizure disorder-apprehended by law enforcement and incarcerated-subsequently developed chest pain and shortness of breath-was found to have right-sided pneumothorax-chest tube placed-patient was then [facility] for cardi[facility]oracic surgery evaluation.  He was also found to have an incidental COVID-19 infection.   COVID-19 vaccinated status:   Significant Events: 4/30>> [facility]-for CTVS evaluation of recurrent spontaneous pneumothorax 5/5>> chest tube clamped-pneumothorax reoccurred with lung collapse  Significant studies: 4/30>> CT chest: Right-sided chest tube, trace pneumothorax-emphysema with prominent subpleural bulla within the right upper/lower lobes.  COVID-19 medications: None  Antibiotics: None  Microbiology data: 4/30>> COVID PCR: Positive  Procedures: 4/30 >> right-sided chest [facility] right thoracoscopy-wedge resection  Consults: Cardiothoracic surgery  DVT prophylaxis: apixaban (ELIQUIS) tablet 2.5 mg Start: 11/03/20 1330 Place and maintain sequential compression device Start: 11/03/20 0707 SCD's Start: 10/30/20 1642     Subjective:   Patient in bed, he looks comfortable and appears to be in no distress but complains of intense right chest wall pain and discomfort at the site of chest tube, no shortness of breath , no abdominal pain. No new focal weakness.    Assessment  & Plan :   Right-sided pneumothorax-s/p VATS with wedge resection on 10/30/20: Cardiothoracic surgery following-defer care to cardiothoracic surgery. Appears comfortable, will start titrating Narcotics down gradually, definite Narcotic seeking behavior.  COVID-19 infection: Incidental-unclear how long he has this infection-he has no symptoms-apart from observation-doubt any treatment required at this time.  Note-patient refused SQ Lovenox-hence switched him to prophylactic  Eliquis. Off Isolation.  Seizure disorder: Continue Depakote, Vimpat, Zonegran.  Polysubstance abuse:  UDS checked-positive both narcotics and cocaine. Counseled.  Tobacco abuse: Refused transdermal nicotine  Mild hypotension.  Hydrate PRN with IV fluids and monitor, stable baseline TSH and random cortisol.    Condition - Stable  Family Communication  : None at bedside.  Code Status :  Full Code  Diet :  Diet Order            Diet regular Room service appropriate? Yes; Fluid consistency: Thin  Diet effective now                  Disposition Plan  :    Status is: Inpatient  Remains inpatient appropriate because:Inpatient level of care appropriate due to severity of illness   Dispo: The patient is from: Prison              Anticipated d/c is to: Prison              Patient currently is not medically stable to d/c.   Difficult to place patient No  Barriers to discharge: Chest tube in place  Antimicorbials  :    Anti-infectives (From admission, onward)   Start     Dose/Rate Route Frequency Ordered Stop   10/30/20 2130  ceFAZolin (ANCEF) IVPB 2g/100 mL premix        2 g 200 mL/hr over 30 Minutes Intravenous Every 8 hours 10/30/20 1641 10/31/20 0719       Inpatient Medications  Scheduled Meds: . acetaminophen  1,000 mg Oral Q8H  . apixaban  2.5 mg Oral BID  . bisacodyl  10 mg Oral Daily  . divalproex  500 mg Oral Daily  . divalproex  750 mg Oral QHS  . feeding supplement  237 mL Oral BID BM  . lacosamide  100 mg Oral BID  . polyethylene glycol  17 g Oral Daily  . senna-docusate  1 tablet Oral QHS  . zonisamide  300 mg Oral QHS   Continuous Infusions:  PRN Meds:.HYDROmorphone (DILAUDID) injection, [DISCONTINUED] ondansetron **OR** ondansetron (ZOFRAN) IV, oxyCODONE   Time Spent in minutes  15  See all Orders from today for further details   Susa Raring M.D on 11/11/2020 at 11:06 AM  To page go to [email or web address] - use universal password  [facility] -  Office  [phone]    Objective:   Vitals:   11/10/20 2100 11/11/20 0010 11/11/20 0415 11/11/20 0724  BP: 104/68 110/63 96/64 102/68  Pulse: 80   76  Resp: 12 16 16 13   Temp: 98.2 F (36.8 C) 98.6 F (37 C) 98.5 F (36.9 C) 98.2 F (36.8 C)  TempSrc: Oral Oral Oral Oral  SpO2: 99%  100%   Weight:        Wt Readings from Last 3 Encounters:  11/06/20 67 kg  06/19/20 74.8 kg  05/30/20 75 kg     Intake/Output Summary (Last 24 hours) at 11/11/2020 1106 Last data filed at 11/11/2020 0525 Gross per 24 hour  Intake 720 ml  Output 1200 ml  Net -480 ml     Physical Exam  Awake Alert, No new F.N deficits, in no distress [place].AT,PERRAL Supple Neck,No JVD, No cervical lymphadenopathy appriciated.  Symmetrical Chest wall movement, Good air movement bilaterally, R. Chest Tube RRR,No Gallops, Rubs or new Murmurs, No Parasternal Heave +ve B.Sounds, Abd Soft, No tenderness, No organomegaly appriciated, No rebound - guarding or rigidity. No Cyanosis, Clubbing or edema, No  new Rash or bruise     Data Review:    Recent Labs  Lab 11/07/20 0115 11/08/20 0109 11/09/20 0114 11/10/20 0155 11/11/20 0048  WBC 8.3 9.4 7.7 7.4 6.3  HGB 12.0* 12.4*  11.3* 11.3* 11.2*  HCT 36.2* 37.8* 34.3* 34.1* 34.0*  PLT 320 368 341 287 312  MCV 90.5 91.3 90.7 92.4 91.4  MCH 30.0 30.0 29.9 30.6 30.1  MCHC 33.1 32.8 32.9 33.1 32.9  RDW 12.0 12.0 12.0 12.1 12.2  LYMPHSABS 2.3 1.9 2.1 1.9 2.1  MONOABS 1.0 1.0 1.1* 1.0 0.8  EOSABS 0.2 0.2 0.2 0.2 0.2  BASOSABS 0.1 0.0 0.0 0.1 0.1    Recent Labs  Lab 11/07/20 0115 11/07/20 1234 11/08/20 0109 11/09/20 0114 11/10/20 0155 11/11/20 0048  NA 134*  --  135 135 136 136  K 3.9  --  3.7 3.6 3.9 3.7  CL 103  --  106 103 103 105  CO2 24  --  GLUCOSE 106*  --  123* 115* 104* 108*  BUN 19  --  CREATININE 0.85  --  0.84 0.87 0.89 0.88  CALCIUM 8.7*  --  9.3 8.9 8.9 8.8*  AST 8*  --  9* 9* 10* 8*  ALT 6  --  ALKPHOS 70  --  75 75 75 74  BILITOT 0.3  --  0.1* 0.3 0.4 0.4  ALBUMIN 2.9*  --  3.1* 3.0* 3.1* 3.0*  MG 1.9  --  2.0 1.9 1.9 2.0  PROCALCITON <0.10  --  <0.10 <0.10 <0.10 <0.10  TSH  --  1.717  --   --   --   --   BNP 5.2  --  5.2 3.8 5.2 4.2       Radiology Reports CT CHEST WO CONTRAST  Result Date: 10/28/2020 CLINICAL DATA:  Pneumothorax, COVID-19 positive, chest tube EXAM: CT CHEST WITHOUT CONTRAST TECHNIQUE: Multidetector CT imaging of the chest was performed following the standard protocol without IV contrast. COMPARISON:  10/28/2020 FINDINGS: Cardiovascular: Unenhanced imaging of the heart and great vessels demonstrates no pericardial effusion. Normal caliber of the thoracic aorta. Mediastinum/Nodes: No enlarged mediastinal or axillary lymph nodes. Thyroid gland, trachea, and esophagus demonstrate no significant findings. Lungs/Pleura: There is an indwelling right-sided chest tube via a right anterolateral approach. Trace residual right basilar pneumothorax is identified, volume estimated far less than 5%. Emphysematous changes are seen, upper lobe predominant, with numerous subpleural bullous seen within the right lower and right upper lobes. Dependent  consolidation within the right lower lobe consistent with atelectasis. Trace right pleural effusion. Central airways are patent. Upper Abdomen: No acute abnormality. Musculoskeletal: No acute or destructive bony lesions. Reconstructed images demonstrate no additional findings. IMPRESSION: 1. Right-sided chest tube as above, with trace residual right basilar pneumothorax volume estimated less than 5%. 2. Emphysema, with prominent subpleural bulla within the right upper and right lower lobes. 3. Minimal dependent lower lobe atelectasis, with trace right pleural effusion. Electronically Signed   By: Sharlet Salina M.D.   On: 10/28/2020 16:08   DG Chest Port 1 View  Result Date: 11/11/2020 CLINICAL DATA:  Right chest tube placement EXAM: PORTABLE CHEST 1 VIEW COMPARISON:  11/10/2020 FINDINGS: Stable position of right chest tube. The small right-sided pneumothorax has decreased in volume compared with the previous exam and is no longer perceptible. No pleural effusion or edema. No airspace densities. IMPRESSION: Decrease in volume of right-sided pneumothorax.  The previously noted pneumothorax is no longer visualized. Electronically Signed   By: Signa Kell M.D.   On: 11/11/2020 08:51   DG Chest Port 1 View  Result Date: 11/10/2020 CLINICAL DATA:  Chest tube.  Air leak. EXAM: PORTABLE CHEST 1 VIEW COMPARISON:  Chest x-Jacky dated 11/09/2020. FINDINGS: RIGHT-sided chest tube is stable in position with tip directed towards the RIGHT lung apex. Stable appearance of the RIGHT-sided pneumothorax, small to moderate in size. LEFT lung is clear. No pleural effusion is seen. Heart size and mediastinal contours are within normal limits. IMPRESSION: 1. Stable appearance of the RIGHT-sided pneumothorax, small to moderate in size. Stable position of the RIGHT-sided chest tube. 2. LEFT lung is clear. Electronically Signed   By: Bary Richard M.D.   On: 11/10/2020 08:01   DG Chest Port 1 View  Result Date:  11/09/2020 CLINICAL DATA:  Pneumothorax Chest tube EXAM: PORTABLE CHEST 1 VIEW COMPARISON:  11/08/2020 FINDINGS: Heart size within normal limits. Small right pneumothorax similar in size to prior examination. Right apical chest tube unchanged position. ACDF changes partially visualized in the lower cervical spine. IMPRESSION: Unchanged small right apical pneumothorax. Electronically Signed   By: Acquanetta Belling M.D.   On: 11/09/2020 07:47   DG CHEST PORT 1 VIEW  Result Date: 11/08/2020 CLINICAL DATA:  Pneumothorax, chest tube EXAM: PORTABLE CHEST 1 VIEW COMPARISON:  11/07/2020 FINDINGS: Right chest tube remains in place with small right pneumothorax, stable. No confluent opacities. Left lung clear. Heart is normal size. IMPRESSION: Stable small right pneumothorax and right chest tube. Electronically Signed   By: Charlett Nose M.D.   On: 11/08/2020 08:45   DG CHEST PORT 1 VIEW  Result Date: 11/07/2020 CLINICAL DATA:  Pneumothorax, chest tube, COVID EXAM: PORTABLE CHEST 1 VIEW COMPARISON:  Chest x-Draycen 11/07/2018 FINDINGS: The heart size and mediastinal contours are within normal limits. No focal consolidation. No pulmonary edema. No pleural effusion. Interval increase in a at least small volume right pneumothorax. Right chest tube in stable position. No left pneumothorax. No acute osseous abnormality. IMPRESSION: Interval increase in size of an at least small volume right pneumothorax with right chest tube in stable position. Electronically Signed   By: Tish Frederickson M.D.   On: 11/07/2020 06:41   DG CHEST PORT 1 VIEW  Result Date: 11/06/2020 CLINICAL DATA:  Pneumothorax, chest tube EXAM: PORTABLE CHEST 1 VIEW COMPARISON:  Portable exam 0627 hours compared to 11/05/2020 FINDINGS: RIGHT thoracostomy tube unchanged. Normal heart size, mediastinal contours, and pulmonary vascularity. Lungs hyperinflated but clear. No pleural effusion seen. Persistent small RIGHT pneumothorax. IMPRESSION: Persistent small RIGHT  pneumothorax despite thoracostomy tube. Electronically Signed   By: Ulyses Southward M.D.   On: 11/06/2020 08:15   DG CHEST PORT 1 VIEW  Result Date: 11/05/2020 CLINICAL DATA:  Chest pain. Positive COVID-19. Right-sided chest tube/pneumothorax. Follow-up exam. EXAM: PORTABLE CHEST 1 VIEW COMPARISON:  11/04/2020 and older studies. FINDINGS: Significant decrease in the size of the right pneumothorax, now with only a minimal right lateral pneumothorax persisting. Stable right chest tube. Lungs are hyperexpanded, but clear. IMPRESSION: 1. Significant decrease in the right pneumothorax, with a minimal pneumothorax persisting. 2. Stable right chest tube.  Lungs remain clear. Electronically Signed   By: Amie Portland M.D.   On: 11/05/2020 09:11   DG CHEST PORT 1 VIEW  Result Date: 11/04/2020 CLINICAL DATA:  Right-sided pneumothorax with chest tube in place EXAM: PORTABLE CHEST 1 VIEW COMPARISON:  Prior chest x-Nadav yesterday 11/03/2020 FINDINGS: Stable and  well positioned right-sided thoracostomy tube. Persistent and slightly increased right-sided pneumothorax. Associated atelectasis in the right lower lobe. The left lung remains clear. Cardiac and mediastinal contours remain clear. No acute osseous abnormality. IMPRESSION: Persistent and perhaps slightly enlarged right-sided pneumothorax with well-positioned chest tube in place. Electronically Signed   By: Malachy Moan M.D.   On: 11/04/2020 09:17   DG Chest Port 1 View  Result Date: 11/03/2020 CLINICAL DATA:  Pneumothorax with chest tube in place EXAM: PORTABLE CHEST 1 VIEW COMPARISON:  Nov 02, 2020 FINDINGS: Chest tube present on the right. Pneumothorax is noted on the right in the lateral and apical regions, considerably smaller compared to 1 day prior. No tension component. Postoperative change noted on the right. There are areas of scattered mild scarring in the right base. There is no edema or airspace opacity. Heart size and pulmonary vascularity are normal.  No adenopathy. No bone lesions. Postoperative change noted in lower cervical region. IMPRESSION: Chest tube remains on the right. Right pneumothorax considerably smaller compared to 1 day prior, although pneumothorax is evident in the apical and lateral aspects on the right. No tension component. Mild scarring right base. No edema or airspace opacity. Cardiac silhouette normal. Electronically Signed   By: Bretta Bang III M.D.   On: 11/03/2020 09:15   DG Chest Port 1 View  Result Date: 11/02/2020 CLINICAL DATA:  Shortness of breath, post clamping of chest tube EXAM: PORTABLE CHEST 1 VIEW COMPARISON:  Portable exam 1610 hours compared to 0629 hours FINDINGS: RIGHT thoracostomy tube unchanged. Subtotal collapse of RIGHT lung. No definite mediastinal shift. Heart size normal. LEFT lung clear. IMPRESSION: Subtotal collapse of RIGHT lung despite presence of RIGHT thoracostomy tube. No definite mediastinal shift. LEFT lung clear. Critical Value/emergent results were called by telephone at the time of interpretation on 11/02/2020 at 7:04 pm to provider Brynda Greathouse MD, who verbally acknowledged these results. Electronically Signed   By: Ulyses Southward M.D.   On: 11/02/2020 19:04   DG CHEST PORT 1 VIEW  Result Date: 11/02/2020 CLINICAL DATA:  Shortness of breath. EXAM: PORTABLE CHEST 1 VIEW COMPARISON:  11/01/2020. FINDINGS: Right chest tube in stable position. Interim increased right pneumothorax. Right pneumothorax remains small. Postsurgical changes right lung. No pleural effusion. Heart size normal. IMPRESSION: Right chest tube in stable position. Interim increase in right-sided pneumothorax. Right mid thorax remains small. Postsurgical changes right lung. Electronically Signed   By: Maisie Fus  Register   On: 11/02/2020 07:49   DG CHEST PORT 1 VIEW  Result Date: 11/01/2020 CLINICAL DATA:  Pneumothorax, chest tube, COVID-19 positive EXAM: PORTABLE CHEST 1 VIEW COMPARISON:  Portable exam 0906 hours compared to  10/31/2020 FINDINGS: RIGHT thoracostomy tube again seen. Tiny RIGHT pneumothorax, slightly decreased. Normal heart size, mediastinal contours, and pulmonary vascularity. Lungs grossly clear. Staple line at RIGHT apex. No pleural effusion or acute osseous findings. IMPRESSION: Slight decrease in tiny RIGHT pneumothorax. Electronically Signed   By: Ulyses Southward M.D.   On: 11/01/2020 10:58   DG CHEST PORT 1 VIEW  Result Date: 10/31/2020 CLINICAL DATA:  Pneumothorax, chest tube EXAM: PORTABLE CHEST 1 VIEW COMPARISON:  Portable exam 0610 hours compared to 10/30/2020 FINDINGS: RIGHT thoracostomy tube stable. Normal heart size, mediastinal contours, and pulmonary vascularity. Persistent small RIGHT pneumothorax with adjacent RIGHT apex staple line. Remaining lungs clear. No pleural effusion or acute osseous findings. IMPRESSION: Persistent small RIGHT apex pneumothorax. Electronically Signed   By: Ulyses Southward M.D.   On: 10/31/2020 07:57   DG  Chest Port 1 View  Result Date: 10/30/2020 CLINICAL DATA:  Postop lung surgery. EXAM: PORTABLE CHEST 1 VIEW COMPARISON:  10/28/2020 FINDINGS: There is a right-sided chest tube in place. No significant right-sided pneumothorax. The heart size is stable. There is no acute osseous abnormality. IMPRESSION: Chest tube in place without evidence for significant right-sided pneumothorax. Electronically Signed   By: Katherine Mantle M.D.   On: 10/30/2020 18:54   DG Chest Port 1 View  Result Date: 10/28/2020 CLINICAL DATA:  Shortness of breath with history of spontaneous pneumothorax. EXAM: PORTABLE CHEST 1 VIEW COMPARISON:  June 19, 2020 FINDINGS: A small to moderate size pneumothorax is seen along the lateral aspect of the mid and lower right hemithorax. Mild atelectasis is seen within the medial aspect of the right lung base. There is no evidence of a pleural effusion. The heart size and mediastinal contours are within normal limits. Radiopaque surgical clips are seen overlying  the right upper quadrant. The visualized skeletal structures are unremarkable. IMPRESSION: Small to moderate sized right-sided pneumothorax. Electronically Signed   By: Aram Candela M.D.   On: 10/28/2020 02:49   DG Chest Port 1V same Day  Result Date: 10/28/2020 CLINICAL DATA:  Chest tube insertion, COVID-19 positive EXAM: PORTABLE CHEST 1 VIEW COMPARISON:  Radiograph 10/28/2020 FINDINGS: Patient is post placement of a right chest tube which terminates in the mid right lung towards midline. Small residual lateral right pneumothorax and some questionable trace right effusion. No left pneumothorax or pleural fluid. Some additional coarsened interstitial opacities are present which could reflect atelectasis or atypical infection in the setting of COVID 19. Stable cardiomediastinal contours. Telemetry leads overlie the chest. IMPRESSION: Interval placement of a right chest tube with decrease in size of the previously seen right pneumothorax Small residual right pneumothorax and trace right pleural fluid remains. Some coarsened interstitial opacities, possibly atelectasis versus atypical infection in the setting of COVID-19. Electronically Signed   By: Kreg Shropshire M.D.   On: 10/28/2020 06:50

## 2020-11-11 NOTE — Progress Notes (Signed)
      [address]       [place] [address]             [phone]       CXR shows: Decrease in volume of right-sided pneumothorax. The previously noted pneumothorax is no longer visualized. Improving with suction. Will continue suction for now. Encourage patient to move around the room as able. Off COVID 19 isolation.   Plan: CXR in the morning. Encourage activity and use of IS.   Jari Favre, PA-C

## 2020-11-12 ENCOUNTER — Inpatient Hospital Stay (HOSPITAL_COMMUNITY): Payer: Self-pay

## 2020-11-12 LAB — CBC WITH DIFFERENTIAL/PLATELET
Abs Immature Granulocytes: 0.02 10*3/uL (ref 0.00–0.07)
Basophils Absolute: 0.1 10*3/uL (ref 0.0–0.1)
Basophils Relative: 1 %
Eosinophils Absolute: 0.2 10*3/uL (ref 0.0–0.5)
Eosinophils Relative: 3 %
HCT: 34.9 % — ABNORMAL LOW (ref 39.0–52.0)
Hemoglobin: 11.3 g/dL — ABNORMAL LOW (ref 13.0–17.0)
Immature Granulocytes: 0 %
Lymphocytes Relative: 27 %
Lymphs Abs: 1.9 10*3/uL (ref 0.7–4.0)
MCH: 29.9 pg (ref 26.0–34.0)
MCHC: 32.4 g/dL (ref 30.0–36.0)
MCV: 92.3 fL (ref 80.0–100.0)
Monocytes Absolute: 0.8 10*3/uL (ref 0.1–1.0)
Monocytes Relative: 11 %
Neutro Abs: 4.2 10*3/uL (ref 1.7–7.7)
Neutrophils Relative %: 58 %
Platelets: 305 10*3/uL (ref 150–400)
RBC: 3.78 MIL/uL — ABNORMAL LOW (ref 4.22–5.81)
RDW: 12.2 % (ref 11.5–15.5)
WBC: 7.1 10*3/uL (ref 4.0–10.5)
nRBC: 0 % (ref 0.0–0.2)

## 2020-11-12 LAB — COMPREHENSIVE METABOLIC PANEL
ALT: 7 U/L (ref 0–44)
AST: 10 U/L — ABNORMAL LOW (ref 15–41)
Albumin: 3.1 g/dL — ABNORMAL LOW (ref 3.5–5.0)
Alkaline Phosphatase: 84 U/L (ref 38–126)
Anion gap: 5 (ref 5–15)
BUN: 20 mg/dL (ref 6–20)
CO2: 24 mmol/L (ref 22–32)
Calcium: 8.8 mg/dL — ABNORMAL LOW (ref 8.9–10.3)
Chloride: 107 mmol/L (ref 98–111)
Creatinine, Ser: 0.98 mg/dL (ref 0.61–1.24)
GFR, Estimated: >60 mL/min (ref >60)
Glucose, Bld: 137 mg/dL — ABNORMAL HIGH (ref 70–99)
Potassium: 3.6 mmol/L (ref 3.5–5.1)
Sodium: 136 mmol/L (ref 135–145)
Total Bilirubin: 0.5 mg/dL (ref 0.3–1.2)
Total Protein: 6.2 g/dL — ABNORMAL LOW (ref 6.5–8.1)

## 2020-11-12 LAB — BRAIN NATRIURETIC PEPTIDE: B Natriuretic Peptide: 4.4 pg/mL (ref 0.0–100.0)

## 2020-11-12 LAB — MAGNESIUM: Magnesium: 2 mg/dL (ref 1.7–2.4)

## 2020-11-12 LAB — LIPASE, BLOOD: Lipase: 27 U/L (ref 11–51)

## 2020-11-12 LAB — PROCALCITONIN: Procalcitonin: <0.1 ng/mL

## 2020-11-12 MED ORDER — HYDROMORPHONE HCL 1 MG/ML IJ SOLN
1.0000 mg | INTRAMUSCULAR | Status: DC | PRN
  Administered 2020-11-12 – 2020-11-22 (×56): 1 mg via INTRAVENOUS
  Filled 2020-11-12 (×58): qty 1

## 2020-11-12 MED ORDER — OXYCODONE HCL 5 MG PO TABS
10.0000 mg | ORAL_TABLET | Freq: Four times a day (QID) | ORAL | Status: DC | PRN
  Administered 2020-11-12 – 2020-11-20 (×11): 10 mg via ORAL
  Filled 2020-11-12 (×13): qty 2

## 2020-11-12 NOTE — Progress Notes (Signed)
      [address]       [place] [address]             [phone]      13 Days Post-Op Procedure(s) (LRB): XI ROBOTIC ASSISTED THORASCOPY-WEDGE RESECTION,UPPER LOBE AND LOWER LOBE (Right) INTERCOSTAL NERVE BLOCK (Right) PLEURECTOMY (Right) Subjective: Got up to use the bathroom and had some pain, dizziness, and shortness of breath. After sitting down it resolved.   Objective: Vital signs in last 24 hours: Temp:  [97.7 F (36.5 C)-98.6 F (37 C)] 98.6 F (37 C) (05/15 0827) Pulse Rate:  [62-93] 62 (05/15 0827) Cardiac Rhythm: Normal sinus rhythm;Bundle branch block (05/15 0700) Resp:  [11-19] 19 (05/15 0827) BP: (89-102)/(55-71) 97/70 (05/15 0827) SpO2:  [97 %-100 %] 98 % (05/15 0827)     Intake/Output from previous day: 05/14 0701 - 05/15 0700 In: -  Out: 1440 [Urine:1430; Chest Tube:10] Intake/Output this shift: No intake/output data recorded.  General appearance: alert, cooperative and no distress Heart: regular rate and rhythm, S1, S2 normal, no murmur, click, rub or gallop Lungs: clear to auscultation bilaterally Abdomen: soft, non-tender; bowel sounds normal; no masses,  no organomegaly Extremities: extremities normal, atraumatic, no cyanosis or edema Wound: clean and dry   Lab Results: Recent Labs    11/11/20 0048 11/12/20 0336  WBC 6.3 7.1  HGB 11.2* 11.3*  HCT 34.0* 34.9*  PLT 312 305   BMET:  Recent Labs    11/11/20 0048 11/12/20 0336  NA 136 136  K 3.7 3.6  CL 105 107  CO2 26 24  GLUCOSE 108* 137*  BUN 17 20  CREATININE 0.88 0.98  CALCIUM 8.8* 8.8*    PT/INR: No results for input(s): LABPROT, INR in the last 72 hours. ABG No results found for: PHART, HCO3, TCO2, ACIDBASEDEF, O2SAT CBG (last 3)  No results for input(s): GLUCAP in the last 72 hours.  Assessment/Plan: S/P Procedure(s) (LRB): XI ROBOTIC ASSISTED THORASCOPY-WEDGE RESECTION,UPPER LOBE AND LOWER LOBE (Right) INTERCOSTAL NERVE BLOCK (Right) PLEURECTOMY  (Right)  1. CXR: No evidence of right pneumo, no acute disease, emphysema 2. No air leak, 20cm of suction 3. Appetite has increased and he is eating more calories 4. Encouraged use of incentive spirometer which he is up to 1500 on 5. Encouraged ambulation around the room as able which he states he has been doing about once a day.   Plan: Continue on suction for today, hopefully can advance to water seal tomorrow. Continue serial chest xrays.    LOS: 15 days    Sharlene Dory 11/12/2020

## 2020-11-12 NOTE — Progress Notes (Signed)
PROGRESS NOTE                                                                                                                                                                                                             Patient Demographics:    Ronald Butler, is a 45 y.o. male, DOB - 04/14/1976, VFI:[id number]  Outpatient Primary MD for the patient is Patient, No Pcp Per (Inactive)   Admit date - 10/28/2020   LOS - 15  Chief Complaint  Patient presents with  . Shortness of Breath    States that it hurts to breathe et that he has a collapsed upper right lung       Brief Narrative: Patient is a 45 y.o. male with PMHx of recurrent spontaneous pneumothorax-previously refused VATS-polysubstance abuse, seizure disorder-apprehended by law enforcement and incarcerated-subsequently developed chest pain and shortness of breath-was found to have right-sided pneumothorax-chest tube placed-patient was then transferred to [facility] for cardiothoracic surgery evaluation.  He was also found to have an incidental COVID-19 infection.   COVID-19 vaccinated status:   Significant Events: 4/30>> transfer to [facility] from [facility]-for CTVS evaluation of recurrent spontaneous pneumothorax 5/5>> chest tube clamped-pneumothorax reoccurred with lung collapse  Significant studies: 4/30>> CT chest: Right-sided chest tube, trace pneumothorax-emphysema with prominent subpleural bulla within the right upper/lower lobes.  COVID-19 medications: None  Antibiotics: None  Microbiology data: 4/30>> COVID PCR: Positive  Procedures: 4/30 >> right-sided chest tube placement in the emergency room at [facility] 5/2>> right thoracoscopy-wedge resection  Consults: Cardiothoracic surgery  DVT prophylaxis: apixaban (ELIQUIS) tablet 2.5 mg Start: 11/03/20 1330 Place and maintain sequential compression device Start: 11/03/20 0707 SCD's Start: 10/30/20 1642     Subjective:   Patient in bed watching television shortness of breath appears comfortable denies any headache or shortness of breath, no focal weakness, still complains of intense right-sided chest tube pain at all times and wants a more IV Dilaudid.   Assessment  & Plan :   Right-sided pneumothorax-s/p VATS with wedge resection on 10/30/20: Cardiothoracic surgery following-defer care to cardiothoracic surgery. Appears comfortable, will start titrating Narcotics down gradually, definite Narcotic seeking behavior.  COVID-19 infection: Incidental-unclear how long he has this infection-he has no symptoms-apart from observation-doubt any treatment required at this time.  Note-patient refused SQ Lovenox-hence switched him to prophylactic Eliquis. Off Isolation.  Seizure  disorder: Continue Depakote, Vimpat, Zonegran.  Polysubstance abuse:  UDS checked-positive both narcotics and cocaine. Counseled.  Tobacco abuse: Refused transdermal nicotine  Mild hypotension.  Hydrate PRN with IV fluids and monitor, stable baseline TSH and random cortisol.    Condition - Stable  Family Communication  : None at bedside.  Code Status :  Full Code  Diet :  Diet Order            Diet regular Room service appropriate? Yes; Fluid consistency: Thin  Diet effective now                  Disposition Plan  :    Status is: Inpatient  Remains inpatient appropriate because:Inpatient level of care appropriate due to severity of illness   Dispo: The patient is from: Prison              Anticipated d/c is to: Prison              Patient currently is not medically stable to d/c.   Difficult to place patient No  Barriers to discharge: Chest tube in place  Antimicorbials  :    Anti-infectives (From admission, onward)   Start     Dose/Rate Route Frequency Ordered Stop   10/30/20 2130  ceFAZolin (ANCEF) IVPB 2g/100 mL premix        2 g 200 mL/hr over 30 Minutes Intravenous Every 8 hours 10/30/20 1641  10/31/20 0719      Inpatient Medications  Scheduled Meds: . acetaminophen  1,000 mg Oral Q8H  . apixaban  2.5 mg Oral BID  . bisacodyl  10 mg Oral Daily  . divalproex  500 mg Oral Daily  . divalproex  750 mg Oral QHS  . feeding supplement  237 mL Oral BID BM  . lacosamide  100 mg Oral BID  . polyethylene glycol  17 g Oral Daily  . senna-docusate  1 tablet Oral QHS  . zonisamide  300 mg Oral QHS   Continuous Infusions:  PRN Meds:.HYDROmorphone (DILAUDID) injection, [DISCONTINUED] ondansetron **OR** ondansetron (ZOFRAN) IV, oxyCODONE   Time Spent in minutes  15  See all Orders from today for further details   Susa Raring M.D on 11/12/2020 at 10:41 AM  To page go to [email or web address] - use universal password  [facility] -  Office  [phone]    Objective:   Vitals:   11/12/20 0008 11/12/20 0012 11/12/20 0539 11/12/20 0827  BP: 99/63 97/62 102/71 97/70  Pulse: 86 87 75 62  Resp: Temp: 97.9 F (36.6 C) 97.9 F (36.6 C) 98.3 F (36.8 C) 98.6 F (37 C)  TempSrc: Axillary Axillary Oral Oral  SpO2: 97% 98% 100% 98%  Weight:        Wt Readings from Last 3 Encounters:  11/06/20 67 kg  06/19/20 74.8 kg  05/30/20 75 kg     Intake/Output Summary (Last 24 hours) at 11/12/2020 1041 Last data filed at 11/12/2020 0981 Gross per 24 hour  Intake --  Output 1440 ml  Net -1440 ml     Physical Exam  In bed appears absolutely comfortable watching television [place].AT,PERRAL Supple Neck,No JVD, No cervical lymphadenopathy appriciated.  Symmetrical Chest wall movement, Good air movement bilaterally, right-sided chest tube in place RRR,No Gallops, Rubs or new Murmurs, No Parasternal Heave +ve B.Sounds, Abd Soft, No tenderness, No organomegaly appriciated, No rebound - guarding or rigidity. No Cyanosis, Clubbing or edema, No new Rash or bruise  Data Review:    Recent Labs  Lab 11/08/20 0109 11/09/20 0114 11/10/20 0155 11/11/20 0048  11/12/20 0336  WBC 9.4 7.7 7.4 6.3 7.1  HGB 12.4* 11.3* 11.3* 11.2* 11.3*  HCT 37.8* 34.3* 34.1* 34.0* 34.9*  PLT 368 341 287 312 305  MCV 91.3 90.7 92.4 91.4 92.3  MCH 30.0 29.9 30.6 30.1 29.9  MCHC 32.8 32.9 33.1 32.9 32.4  RDW 12.0 12.0 12.1 12.2 12.2  LYMPHSABS 1.9 2.1 1.9 2.1 1.9  MONOABS 1.0 1.1* 1.0 0.8 0.8  EOSABS 0.2 0.2 0.2 0.2 0.2  BASOSABS 0.0 0.0 0.1 0.1 0.1    Recent Labs  Lab 11/07/20 1234 11/08/20 0109 11/09/20 0114 11/10/20 0155 11/11/20 0048 11/12/20 0336  NA  --  135 135 136 136 136  K  --  3.7 3.6 3.9 3.7 3.6  CL  --  106 103 103 105 107  CO2  --  23 25 23 26 24   GLUCOSE  --  123* 115* 104* 108* 137*  BUN  --  17 18 20 17 20   CREATININE  --  0.84 0.87 0.89 0.88 0.98  CALCIUM  --  9.3 8.9 8.9 8.8* 8.8*  AST  --  9* 9* 10* 8* 10*  ALT  --  8 8 8 8 7   ALKPHOS  --  75 75 75 74 84  BILITOT  --  0.1* 0.3 0.4 0.4 0.5  ALBUMIN  --  3.1* 3.0* 3.1* 3.0* 3.1*  MG  --  2.0 1.9 1.9 2.0 2.0  PROCALCITON  --  <0.10 <0.10 <0.10 <0.10 <0.10  TSH 1.717  --   --   --   --   --   BNP  --  5.2 3.8 5.2 4.2 4.4       Radiology Reports CT CHEST WO CONTRAST  Result Date: 10/28/2020 CLINICAL DATA:  Pneumothorax, COVID-19 positive, chest tube EXAM: CT CHEST WITHOUT CONTRAST TECHNIQUE: Multidetector CT imaging of the chest was performed following the standard protocol without IV contrast. COMPARISON:  10/28/2020 FINDINGS: Cardiovascular: Unenhanced imaging of the heart and great vessels demonstrates no pericardial effusion. Normal caliber of the thoracic aorta. Mediastinum/Nodes: No enlarged mediastinal or axillary lymph nodes. Thyroid gland, trachea, and esophagus demonstrate no significant findings. Lungs/Pleura: There is an indwelling right-sided chest tube via a right anterolateral approach. Trace residual right basilar pneumothorax is identified, volume estimated far less than 5%. Emphysematous changes are seen, upper lobe predominant, with numerous subpleural bullous seen  within the right lower and right upper lobes. Dependent consolidation within the right lower lobe consistent with atelectasis. Trace right pleural effusion. Central airways are patent. Upper Abdomen: No acute abnormality. Musculoskeletal: No acute or destructive bony lesions. Reconstructed images demonstrate no additional findings. IMPRESSION: 1. Right-sided chest tube as above, with trace residual right basilar pneumothorax volume estimated less than 5%. 2. Emphysema, with prominent subpleural bulla within the right upper and right lower lobes. 3. Minimal dependent lower lobe atelectasis, with trace right pleural effusion. Electronically Signed   By: M.D.   On: 10/28/2020 16:08   DG Chest Port 1 View  Result Date: 11/12/2020 CLINICAL DATA:  Follow-up RIGHT pneumothorax with chest tube in place. EXAM: PORTABLE CHEST 1 VIEW COMPARISON:  11/11/2020 and earlier, including CT chest 10/28/2020. FINDINGS: RIGHT chest tube in place with no evidence of a residual RIGHT pneumothorax. Bullous emphysematous changes in both lungs as noted previously. Lungs clear. Bronchovascular markings normal. No pleural effusions. Cardiomediastinal silhouette unremarkable and unchanged. IMPRESSION: 1.  No evidence of residual RIGHT pneumothorax. 2. No acute cardiopulmonary disease. 3.  Emphysema. ([id number](ICD10-J43.9) Electronically Signed   By: Hulan Saashomas  Lawrence M.D.   On: 11/12/2020 09:23   DG Chest Port 1 View  Result Date: 11/11/2020 CLINICAL DATA:  Right chest tube placement EXAM: PORTABLE CHEST 1 VIEW COMPARISON:  11/10/2020 FINDINGS: Stable position of right chest tube. The small right-sided pneumothorax has decreased in volume compared with the previous exam and is no longer perceptible. No pleural effusion or edema. No airspace densities. IMPRESSION: Decrease in volume of right-sided pneumothorax. The previously noted pneumothorax is no longer visualized. Electronically Signed   By: Signa Kellaylor  Stroud M.D.   On: 11/11/2020  08:51   DG Chest Port 1 View  Result Date: 11/10/2020 CLINICAL DATA:  Chest tube.  Air leak. EXAM: PORTABLE CHEST 1 VIEW COMPARISON:  Chest x-Cecile dated 11/09/2020. FINDINGS: RIGHT-sided chest tube is stable in position with tip directed towards the RIGHT lung apex. Stable appearance of the RIGHT-sided pneumothorax, small to moderate in size. LEFT lung is clear. No pleural effusion is seen. Heart size and mediastinal contours are within normal limits. IMPRESSION: 1. Stable appearance of the RIGHT-sided pneumothorax, small to moderate in size. Stable position of the RIGHT-sided chest tube. 2. LEFT lung is clear. Electronically Signed   By: Bary RichardStan  Maynard M.D.   On: 11/10/2020 08:01   DG Chest Port 1 View  Result Date: 11/09/2020 CLINICAL DATA:  Pneumothorax Chest tube EXAM: PORTABLE CHEST 1 VIEW COMPARISON:  11/08/2020 FINDINGS: Heart size within normal limits. Small right pneumothorax similar in size to prior examination. Right apical chest tube unchanged position. ACDF changes partially visualized in the lower cervical spine. IMPRESSION: Unchanged small right apical pneumothorax. Electronically Signed   By: Acquanetta BellingFarhaan  Mir M.D.   On: 11/09/2020 07:47   DG CHEST PORT 1 VIEW  Result Date: 11/08/2020 CLINICAL DATA:  Pneumothorax, chest tube EXAM: PORTABLE CHEST 1 VIEW COMPARISON:  11/07/2020 FINDINGS: Right chest tube remains in place with small right pneumothorax, stable. No confluent opacities. Left lung clear. Heart is normal size. IMPRESSION: Stable small right pneumothorax and right chest tube. Electronically Signed   By: Charlett NoseKevin  Dover M.D.   On: 11/08/2020 08:45   DG CHEST PORT 1 VIEW  Result Date: 11/07/2020 CLINICAL DATA:  Pneumothorax, chest tube, COVID EXAM: PORTABLE CHEST 1 VIEW COMPARISON:  Chest x-Feliberto 11/07/2018 FINDINGS: The heart size and mediastinal contours are within normal limits. No focal consolidation. No pulmonary edema. No pleural effusion. Interval increase in a at least small volume  right pneumothorax. Right chest tube in stable position. No left pneumothorax. No acute osseous abnormality. IMPRESSION: Interval increase in size of an at least small volume right pneumothorax with right chest tube in stable position. Electronically Signed   By: Tish FredericksonMorgane  Naveau M.D.   On: 11/07/2020 06:41   DG CHEST PORT 1 VIEW  Result Date: 11/06/2020 CLINICAL DATA:  Pneumothorax, chest tube EXAM: PORTABLE CHEST 1 VIEW COMPARISON:  Portable exam 0627 hours compared to 11/05/2020 FINDINGS: RIGHT thoracostomy tube unchanged. Normal heart size, mediastinal contours, and pulmonary vascularity. Lungs hyperinflated but clear. No pleural effusion seen. Persistent small RIGHT pneumothorax. IMPRESSION: Persistent small RIGHT pneumothorax despite thoracostomy tube. Electronically Signed   By: Ulyses SouthwardMark  Boles M.D.   On: 11/06/2020 08:15   DG CHEST PORT 1 VIEW  Result Date: 11/05/2020 CLINICAL DATA:  Chest pain. Positive COVID-19. Right-sided chest tube/pneumothorax. Follow-up exam. EXAM: PORTABLE CHEST 1 VIEW COMPARISON:  11/04/2020 and older studies. FINDINGS: Significant decrease in the  size of the right pneumothorax, now with only a minimal right lateral pneumothorax persisting. Stable right chest tube. Lungs are hyperexpanded, but clear. IMPRESSION: 1. Significant decrease in the right pneumothorax, with a minimal pneumothorax persisting. 2. Stable right chest tube.  Lungs remain clear. Electronically Signed   By: Amie Portland M.D.   On: 11/05/2020 09:11   DG CHEST PORT 1 VIEW  Result Date: 11/04/2020 CLINICAL DATA:  Right-sided pneumothorax with chest tube in place EXAM: PORTABLE CHEST 1 VIEW COMPARISON:  Prior chest x-Zavian yesterday 11/03/2020 FINDINGS: Stable and well positioned right-sided thoracostomy tube. Persistent and slightly increased right-sided pneumothorax. Associated atelectasis in the right lower lobe. The left lung remains clear. Cardiac and mediastinal contours remain clear. No acute osseous  abnormality. IMPRESSION: Persistent and perhaps slightly enlarged right-sided pneumothorax with well-positioned chest tube in place. Electronically Signed   By: Malachy Moan M.D.   On: 11/04/2020 09:17   DG Chest Port 1 View  Result Date: 11/03/2020 CLINICAL DATA:  Pneumothorax with chest tube in place EXAM: PORTABLE CHEST 1 VIEW COMPARISON:  Nov 02, 2020 FINDINGS: Chest tube present on the right. Pneumothorax is noted on the right in the lateral and apical regions, considerably smaller compared to 1 day prior. No tension component. Postoperative change noted on the right. There are areas of scattered mild scarring in the right base. There is no edema or airspace opacity. Heart size and pulmonary vascularity are normal. No adenopathy. No bone lesions. Postoperative change noted in lower cervical region. IMPRESSION: Chest tube remains on the right. Right pneumothorax considerably smaller compared to 1 day prior, although pneumothorax is evident in the apical and lateral aspects on the right. No tension component. Mild scarring right base. No edema or airspace opacity. Cardiac silhouette normal. Electronically Signed   By: Bretta Bang III M.D.   On: 11/03/2020 09:15   DG Chest Port 1 View  Result Date: 11/02/2020 CLINICAL DATA:  Shortness of breath, post clamping of chest tube EXAM: PORTABLE CHEST 1 VIEW COMPARISON:  Portable exam 1610 hours compared to 0629 hours FINDINGS: RIGHT thoracostomy tube unchanged. Subtotal collapse of RIGHT lung. No definite mediastinal shift. Heart size normal. LEFT lung clear. IMPRESSION: Subtotal collapse of RIGHT lung despite presence of RIGHT thoracostomy tube. No definite mediastinal shift. LEFT lung clear. Critical Value/emergent results were called by telephone at the time of interpretation on 11/02/2020 at 7:04 pm to provider Brynda Greathouse MD, who verbally acknowledged these results. Electronically Signed   By: Ulyses Southward M.D.   On: 11/02/2020 19:04   DG CHEST  PORT 1 VIEW  Result Date: 11/02/2020 CLINICAL DATA:  Shortness of breath. EXAM: PORTABLE CHEST 1 VIEW COMPARISON:  11/01/2020. FINDINGS: Right chest tube in stable position. Interim increased right pneumothorax. Right pneumothorax remains small. Postsurgical changes right lung. No pleural effusion. Heart size normal. IMPRESSION: Right chest tube in stable position. Interim increase in right-sided pneumothorax. Right mid thorax remains small. Postsurgical changes right lung. Electronically Signed   By: Maisie Fus  Register   On: 11/02/2020 07:49   DG CHEST PORT 1 VIEW  Result Date: 11/01/2020 CLINICAL DATA:  Pneumothorax, chest tube, COVID-19 positive EXAM: PORTABLE CHEST 1 VIEW COMPARISON:  Portable exam 0906 hours compared to 10/31/2020 FINDINGS: RIGHT thoracostomy tube again seen. Tiny RIGHT pneumothorax, slightly decreased. Normal heart size, mediastinal contours, and pulmonary vascularity. Lungs grossly clear. Staple line at RIGHT apex. No pleural effusion or acute osseous findings. IMPRESSION: Slight decrease in tiny RIGHT pneumothorax. Electronically Signed   By: Loraine Leriche  Tyron Russell M.D.   On: 11/01/2020 10:58   DG CHEST PORT 1 VIEW  Result Date: 10/31/2020 CLINICAL DATA:  Pneumothorax, chest tube EXAM: PORTABLE CHEST 1 VIEW COMPARISON:  Portable exam 0610 hours compared to 10/30/2020 FINDINGS: RIGHT thoracostomy tube stable. Normal heart size, mediastinal contours, and pulmonary vascularity. Persistent small RIGHT pneumothorax with adjacent RIGHT apex staple line. Remaining lungs clear. No pleural effusion or acute osseous findings. IMPRESSION: Persistent small RIGHT apex pneumothorax. Electronically Signed   By: Ulyses Southward M.D.   On: 10/31/2020 07:57   DG Chest Port 1 View  Result Date: 10/30/2020 CLINICAL DATA:  Postop lung surgery. EXAM: PORTABLE CHEST 1 VIEW COMPARISON:  10/28/2020 FINDINGS: There is a right-sided chest tube in place. No significant right-sided pneumothorax. The heart size is stable. There  is no acute osseous abnormality. IMPRESSION: Chest tube in place without evidence for significant right-sided pneumothorax. Electronically Signed   By: Katherine Mantle M.D.   On: 10/30/2020 18:54   DG Chest Port 1 View  Result Date: 10/28/2020 CLINICAL DATA:  Shortness of breath with history of spontaneous pneumothorax. EXAM: PORTABLE CHEST 1 VIEW COMPARISON:  June 19, 2020 FINDINGS: A small to moderate size pneumothorax is seen along the lateral aspect of the mid and lower right hemithorax. Mild atelectasis is seen within the medial aspect of the right lung base. There is no evidence of a pleural effusion. The heart size and mediastinal contours are within normal limits. Radiopaque surgical clips are seen overlying the right upper quadrant. The visualized skeletal structures are unremarkable. IMPRESSION: Small to moderate sized right-sided pneumothorax. Electronically Signed   By: Aram Candela M.D.   On: 10/28/2020 02:49   DG Chest Port 1V same Day  Result Date: 10/28/2020 CLINICAL DATA:  Chest tube insertion, COVID-19 positive EXAM: PORTABLE CHEST 1 VIEW COMPARISON:  Radiograph 10/28/2020 FINDINGS: Patient is post placement of a right chest tube which terminates in the mid right lung towards midline. Small residual lateral right pneumothorax and some questionable trace right effusion. No left pneumothorax or pleural fluid. Some additional coarsened interstitial opacities are present which could reflect atelectasis or atypical infection in the setting of COVID 19. Stable cardiomediastinal contours. Telemetry leads overlie the chest. IMPRESSION: Interval placement of a right chest tube with decrease in size of the previously seen right pneumothorax Small residual right pneumothorax and trace right pleural fluid remains. Some coarsened interstitial opacities, possibly atelectasis versus atypical infection in the setting of COVID-19. Electronically Signed   By: Kreg Shropshire M.D.   On: 10/28/2020  06:50

## 2020-11-13 ENCOUNTER — Inpatient Hospital Stay (HOSPITAL_COMMUNITY): Payer: Self-pay

## 2020-11-13 NOTE — Progress Notes (Signed)
PROGRESS NOTE                                                                                                                                                                                                             Patient Demographics:    Ronald Butler, is a 45 y.o. male, DOB - 11-25-1975, ZOX:[id number]  Outpatient Primary MD for the patient is Patient, No Pcp Per (Inactive)   Admit date - 10/28/2020   LOS - 16  Chief Complaint  Patient presents with  . Shortness of Breath    States that it hurts to breathe et that he has a collapsed upper right lung       Brief Narrative: Patient is a 45 y.o. male with PMHx of recurrent spontaneous pneumothorax-previously refused VATS-polysubstance abuse, seizure disorder-apprehended by law enforcement and incarcerated-subsequently developed chest pain and shortness of breath-was found to have right-sided pneumothorax-chest tube placed-patient was then transferred to [facility] for cardiothoracic surgery evaluation.  He was also found to have an incidental COVID-19 infection.   COVID-19 vaccinated status:   Significant Events: 4/30>> transfer to [facility] from [facility]-for CTVS evaluation of recurrent spontaneous pneumothorax 5/5>> chest tube clamped-pneumothorax reoccurred with lung collapse  Significant studies: 4/30>> CT chest: Right-sided chest tube, trace pneumothorax-emphysema with prominent subpleural bulla within the right upper/lower lobes.  COVID-19 medications: None  Antibiotics: None  Microbiology data: 4/30>> COVID PCR: Positive  Procedures: 4/30 >> right-sided chest tube placement in the emergency room at [facility] 5/2>> right thoracoscopy-wedge resection  Consults: Cardiothoracic surgery  DVT prophylaxis: apixaban (ELIQUIS) tablet 2.5 mg Start: 11/03/20 1330 Place and maintain sequential compression device Start: 11/03/20 0707 SCD's Start: 10/30/20 1642     Subjective:   Patient in bed resting comfortably in no distress whatsoever however still claims that his right-sided chest tube is calling to 10 out of 10 pain and he would appreciate his pain medications were increased, no shortness of breath.   Assessment  & Plan :   Right-sided pneumothorax-s/p VATS with wedge resection on 10/30/20: Cardiothoracic surgery following-defer care to cardiothoracic surgery. Appears comfortable, will start titrating Narcotics down gradually, definite Narcotic seeking behavior.  Case discussed with Dr. Cliffton Asters chest tube may come out on 11/14/2020.  COVID-19 infection: Incidental-unclear how long he has this infection-he has no symptoms-apart from observation-doubt any treatment required at this time.  Note-patient refused SQ Lovenox-hence switched him to prophylactic Eliquis. Off Isolation.  Seizure disorder: Continue Depakote, Vimpat, Zonegran.  Polysubstance abuse:  UDS checked-positive both narcotics and cocaine. Counseled.  Tobacco abuse: Refused transdermal nicotine  Mild hypotension.  Hydrate PRN with IV fluids and monitor, stable baseline TSH and random cortisol.    Condition - Stable  Family Communication  : None at bedside.  Code Status :  Full Code  Diet :  Diet Order            Diet regular Room service appropriate? Yes; Fluid consistency: Thin  Diet effective now                  Disposition Plan  :    Status is: Inpatient  Remains inpatient appropriate because:Inpatient level of care appropriate due to severity of illness   Dispo: The [facility] from: Prison              Anticipated d/c is to: Prison              Patient currently is not medically stable to d/c.   Difficult to place patient No  Barriers to discharge: Chest tube in place  Antimicorbials  :    Anti-infectives (From admission, onward)   Start     Dose/Rate Route Frequency Ordered Stop   10/30/20 2130  ceFAZolin (ANCEF) IVPB 2g/100 mL premix        2 g 200  mL/hr over 30 Minutes Intravenous Every 8 hours 10/30/20 1641 10/31/20 0719      Inpatient Medications  Scheduled Meds: . acetaminophen  1,000 mg Oral Q8H  . apixaban  2.5 mg Oral BID  . bisacodyl  10 mg Oral Daily  . divalproex  500 mg Oral Daily  . divalproex  750 mg Oral QHS  . feeding supplement  237 mL Oral BID BM  . lacosamide  100 mg Oral BID  . polyethylene glycol  17 g Oral Daily  . senna-docusate  1 tablet Oral QHS  . zonisamide  300 mg Oral QHS   Continuous Infusions:  PRN Meds:.HYDROmorphone (DILAUDID) injection, [DISCONTINUED] ondansetron **OR** ondansetron (ZOFRAN) IV, oxyCODONE   Time Spent in minutes  15  See all Orders from today for further details   Susa Raring M.D on 11/13/2020 at 10:33 AM  To page go to [email or web address] - use universal password  [facility] -  Office  [phone]    Objective:   Vitals:   11/13/20 0004 11/13/20 0329 11/13/20 0548 11/13/20 0729  BP: 117/86  101/66 104/69  Pulse: 84  78 79  Resp: Temp: 99.9 F (37.7 C)  98.4 F (36.9 C) 98.4 F (36.9 C)  TempSrc: Oral  Oral Oral  SpO2: 100%  100% 100%  Weight:  67.4 kg      Wt Readings from Last 3 Encounters:  11/13/20 67.4 kg  06/19/20 74.8 kg  05/30/20 75 kg     Intake/Output Summary (Last 24 hours) at 11/13/2020 1033 Last data filed at 11/13/2020 0232 Gross per 24 hour  Intake --  Output 2190 ml  Net -2190 ml     Physical Exam  Awake Alert, No new F.N deficits, in no distress whatsoever watching TV and drinking coffee [place].AT,PERRAL Supple Neck,No JVD, No cervical lymphadenopathy appriciated.  Symmetrical Chest wall movement, Good air movement bilaterally, R. Sided Chest tube RRR,No Gallops, Rubs or new Murmurs, No Parasternal Heave +ve B.Sounds, Abd Soft, No tenderness, No organomegaly appriciated,  No rebound - guarding or rigidity. No Cyanosis, Clubbing or edema, No new Rash or bruise     Data Review:    Recent Labs  Lab  11/08/20 0109 11/09/20 0114 11/10/20 0155 11/11/20 0048 11/12/20 0336  WBC 9.4 7.7 7.4 6.3 7.1  HGB 12.4* 11.3* 11.3* 11.2* 11.3*  HCT 37.8* 34.3* 34.1* 34.0* 34.9*  PLT 368 341 287 312 305  MCV 91.3 90.7 92.4 91.4 92.3  MCH 30.0 29.9 30.6 30.1 29.9  MCHC 32.8 32.9 33.1 32.9 32.4  RDW 12.0 12.0 12.1 12.2 12.2  LYMPHSABS 1.9 2.1 1.9 2.1 1.9  MONOABS 1.0 1.1* 1.0 0.8 0.8  EOSABS 0.2 0.2 0.2 0.2 0.2  BASOSABS 0.0 0.0 0.1 0.1 0.1    Recent Labs  Lab 11/07/20 1234 11/08/20 0109 11/09/20 0114 11/10/20 0155 11/11/20 0048 11/12/20 0336  NA  --  135 135 136 136 136  K  --  3.7 3.6 3.9 3.7 3.6  CL  --  106 103 103 105 107  CO2  --  23 25 23 26 24   GLUCOSE  --  123* 115* 104* 108* 137*  BUN  --  17 18 20 17 20   CREATININE  --  0.84 0.87 0.89 0.88 0.98  CALCIUM  --  9.3 8.9 8.9 8.8* 8.8*  AST  --  9* 9* 10* 8* 10*  ALT  --  8 8 8 8 7   ALKPHOS  --  75 75 75 74 84  BILITOT  --  0.1* 0.3 0.4 0.4 0.5  ALBUMIN  --  3.1* 3.0* 3.1* 3.0* 3.1*  MG  --  2.0 1.9 1.9 2.0 2.0  PROCALCITON  --  <0.10 <0.10 <0.10 <0.10 <0.10  TSH 1.717  --   --   --   --   --   BNP  --  5.2 3.8 5.2 4.2 4.4       Radiology Reports CT CHEST WO CONTRAST  Result Date: 10/28/2020 CLINICAL DATA:  Pneumothorax, COVID-19 positive, chest tube EXAM: CT CHEST WITHOUT CONTRAST TECHNIQUE: Multidetector CT imaging of the chest was performed following the standard protocol without IV contrast. COMPARISON:  10/28/2020 FINDINGS: Cardiovascular: Unenhanced imaging of the heart and great vessels demonstrates no pericardial effusion. Normal caliber of the thoracic aorta. Mediastinum/Nodes: No enlarged mediastinal or axillary lymph nodes. Thyroid gland, trachea, and esophagus demonstrate no significant findings. Lungs/Pleura: There is an indwelling right-sided chest tube via a right anterolateral approach. Trace residual right basilar pneumothorax is identified, volume estimated far less than 5%. Emphysematous changes are seen,  upper lobe predominant, with numerous subpleural bullous seen within the right lower and right upper lobes. Dependent consolidation within the right lower lobe consistent with atelectasis. Trace right pleural effusion. Central airways are patent. Upper Abdomen: No acute abnormality. Musculoskeletal: No acute or destructive bony lesions. Reconstructed images demonstrate no additional findings. IMPRESSION: 1. Right-sided chest tube as above, with trace residual right basilar pneumothorax volume estimated less than 5%. 2. Emphysema, with prominent subpleural bulla within the right upper and right lower lobes. 3. Minimal dependent lower lobe atelectasis, with trace right pleural effusion. Electronically Signed   By: Sharlet SalinaMichael  Brown M.D.   On: 10/28/2020 16:08   DG Chest Port 1 View  Result Date: 11/13/2020 CLINICAL DATA:  Chest tube EXAM: PORTABLE CHEST 1 VIEW COMPARISON:  11/12/2020 FINDINGS: The heart size and mediastinal contours are within normal limits. Right-sided chest tube remains in position, tip about the right pulmonary apex. No appreciable residual pneumothorax. Both lungs are  clear. The visualized skeletal structures are unremarkable. IMPRESSION: Right-sided chest tube remains in position. No appreciable residual pneumothorax. No acute abnormality of the lungs. Electronically Signed   By: Lauralyn Primes M.D.   On: 11/13/2020 08:37   DG Chest Port 1 View  Result Date: 11/12/2020 CLINICAL DATA:  Follow-up RIGHT pneumothorax with chest tube in place. EXAM: PORTABLE CHEST 1 VIEW COMPARISON:  11/11/2020 and earlier, including CT chest 10/28/2020. FINDINGS: RIGHT chest tube in place with no evidence of a residual RIGHT pneumothorax. Bullous emphysematous changes in both lungs as noted previously. Lungs clear. Bronchovascular markings normal. No pleural effusions. Cardiomediastinal silhouette unremarkable and unchanged. IMPRESSION: 1. No evidence of residual RIGHT pneumothorax. 2. No acute cardiopulmonary  disease. 3.  Emphysema. ([id number]) Electronically Signed   By: Hulan Saas M.D.   On: 11/12/2020 09:23   DG Chest Port 1 View  Result Date: 11/11/2020 CLINICAL DATA:  Right chest tube placement EXAM: PORTABLE CHEST 1 VIEW COMPARISON:  11/10/2020 FINDINGS: Stable position of right chest tube. The small right-sided pneumothorax has decreased in volume compared with the previous exam and is no longer perceptible. No pleural effusion or edema. No airspace densities. IMPRESSION: Decrease in volume of right-sided pneumothorax. The previously noted pneumothorax is no longer visualized. Electronically Signed   By: Signa Kell M.D.   On: 11/11/2020 08:51   DG Chest Port 1 View  Result Date: 11/10/2020 CLINICAL DATA:  Chest tube.  Air leak. EXAM: PORTABLE CHEST 1 VIEW COMPARISON:  Chest x-Gust dated 11/09/2020. FINDINGS: RIGHT-sided chest tube is stable in position with tip directed towards the RIGHT lung apex. Stable appearance of the RIGHT-sided pneumothorax, small to moderate in size. LEFT lung is clear. No pleural effusion is seen. Heart size and mediastinal contours are within normal limits. IMPRESSION: 1. Stable appearance of the RIGHT-sided pneumothorax, small to moderate in size. Stable position of the RIGHT-sided chest tube. 2. LEFT lung is clear. Electronically Signed   By: Bary Richard M.D.   On: 11/10/2020 08:01   DG Chest Port 1 View  Result Date: 11/09/2020 CLINICAL DATA:  Pneumothorax Chest tube EXAM: PORTABLE CHEST 1 VIEW COMPARISON:  11/08/2020 FINDINGS: Heart size within normal limits. Small right pneumothorax similar in size to prior examination. Right apical chest tube unchanged position. ACDF changes partially visualized in the lower cervical spine. IMPRESSION: Unchanged small right apical pneumothorax. Electronically Signed   By: Acquanetta Belling M.D.   On: 11/09/2020 07:47   DG CHEST PORT 1 VIEW  Result Date: 11/08/2020 CLINICAL DATA:  Pneumothorax, chest tube EXAM: PORTABLE CHEST  1 VIEW COMPARISON:  11/07/2020 FINDINGS: Right chest tube remains in place with small right pneumothorax, stable. No confluent opacities. Left lung clear. Heart is normal size. IMPRESSION: Stable small right pneumothorax and right chest tube. Electronically Signed   By: Charlett Nose M.D.   On: 11/08/2020 08:45   DG CHEST PORT 1 VIEW  Result Date: 11/07/2020 CLINICAL DATA:  Pneumothorax, chest tube, COVID EXAM: PORTABLE CHEST 1 VIEW COMPARISON:  Chest x-Shykeem 11/07/2018 FINDINGS: The heart size and mediastinal contours are within normal limits. No focal consolidation. No pulmonary edema. No pleural effusion. Interval increase in a at least small volume right pneumothorax. Right chest tube in stable position. No left pneumothorax. No acute osseous abnormality. IMPRESSION: Interval increase in size of an at least small volume right pneumothorax with right chest tube in stable position. Electronically Signed   By: Tish Frederickson M.D.   On: 11/07/2020 06:41   DG CHEST PORT  1 VIEW  Result Date: 11/06/2020 CLINICAL DATA:  Pneumothorax, chest tube EXAM: PORTABLE CHEST 1 VIEW COMPARISON:  Portable exam 0627 hours compared to 11/05/2020 FINDINGS: RIGHT thoracostomy tube unchanged. Normal heart size, mediastinal contours, and pulmonary vascularity. Lungs hyperinflated but clear. No pleural effusion seen. Persistent small RIGHT pneumothorax. IMPRESSION: Persistent small RIGHT pneumothorax despite thoracostomy tube. Electronically Signed   By: Ulyses Southward M.D.   On: 11/06/2020 08:15   DG CHEST PORT 1 VIEW  Result Date: 11/05/2020 CLINICAL DATA:  Chest pain. Positive COVID-19. Right-sided chest tube/pneumothorax. Follow-up exam. EXAM: PORTABLE CHEST 1 VIEW COMPARISON:  11/04/2020 and older studies. FINDINGS: Significant decrease in the size of the right pneumothorax, now with only a minimal right lateral pneumothorax persisting. Stable right chest tube. Lungs are hyperexpanded, but clear. IMPRESSION: 1. Significant  decrease in the right pneumothorax, with a minimal pneumothorax persisting. 2. Stable right chest tube.  Lungs remain clear. Electronically Signed   By: Amie Portland M.D.   On: 11/05/2020 09:11   DG CHEST PORT 1 VIEW  Result Date: 11/04/2020 CLINICAL DATA:  Right-sided pneumothorax with chest tube in place EXAM: PORTABLE CHEST 1 VIEW COMPARISON:  Prior chest x-Vinal yesterday 11/03/2020 FINDINGS: Stable and well positioned right-sided thoracostomy tube. Persistent and slightly increased right-sided pneumothorax. Associated atelectasis in the right lower lobe. The left lung remains clear. Cardiac and mediastinal contours remain clear. No acute osseous abnormality. IMPRESSION: Persistent and perhaps slightly enlarged right-sided pneumothorax with well-positioned chest tube in place. Electronically Signed   By: Malachy Moan M.D.   On: 11/04/2020 09:17   DG Chest Port 1 View  Result Date: 11/03/2020 CLINICAL DATA:  Pneumothorax with chest tube in place EXAM: PORTABLE CHEST 1 VIEW COMPARISON:  Nov 02, 2020 FINDINGS: Chest tube present on the right. Pneumothorax is noted on the right in the lateral and apical regions, considerably smaller compared to 1 day prior. No tension component. Postoperative change noted on the right. There are areas of scattered mild scarring in the right base. There is no edema or airspace opacity. Heart size and pulmonary vascularity are normal. No adenopathy. No bone lesions. Postoperative change noted in lower cervical region. IMPRESSION: Chest tube remains on the right. Right pneumothorax considerably smaller compared to 1 day prior, although pneumothorax is evident in the apical and lateral aspects on the right. No tension component. Mild scarring right base. No edema or airspace opacity. Cardiac silhouette normal. Electronically Signed   By: Bretta Bang III M.D.   On: 11/03/2020 09:15   DG Chest Port 1 View  Result Date: 11/02/2020 CLINICAL DATA:  Shortness of breath, post  clamping of chest tube EXAM: PORTABLE CHEST 1 VIEW COMPARISON:  Portable exam 1610 hours compared to 0629 hours FINDINGS: RIGHT thoracostomy tube unchanged. Subtotal collapse of RIGHT lung. No definite mediastinal shift. Heart size normal. LEFT lung clear. IMPRESSION: Subtotal collapse of RIGHT lung despite presence of RIGHT thoracostomy tube. No definite mediastinal shift. LEFT lung clear. Critical Value/emergent results were called by telephone at the time of interpretation on 11/02/2020 at 7:04 pm to provider Brynda Greathouse MD, who verbally acknowledged these results. Electronically Signed   By: Ulyses Southward M.D.   On: 11/02/2020 19:04   DG CHEST PORT 1 VIEW  Result Date: 11/02/2020 CLINICAL DATA:  Shortness of breath. EXAM: PORTABLE CHEST 1 VIEW COMPARISON:  11/01/2020. FINDINGS: Right chest tube in stable position. Interim increased right pneumothorax. Right pneumothorax remains small. Postsurgical changes right lung. No pleural effusion. Heart size normal. IMPRESSION: Right  chest tube in stable position. Interim increase in right-sided pneumothorax. Right mid thorax remains small. Postsurgical changes right lung. Electronically Signed   By: Maisie Fus  Register   On: 11/02/2020 07:49   DG CHEST PORT 1 VIEW  Result Date: 11/01/2020 CLINICAL DATA:  Pneumothorax, chest tube, COVID-19 positive EXAM: PORTABLE CHEST 1 VIEW COMPARISON:  Portable exam 0906 hours compared to 10/31/2020 FINDINGS: RIGHT thoracostomy tube again seen. Tiny RIGHT pneumothorax, slightly decreased. Normal heart size, mediastinal contours, and pulmonary vascularity. Lungs grossly clear. Staple line at RIGHT apex. No pleural effusion or acute osseous findings. IMPRESSION: Slight decrease in tiny RIGHT pneumothorax. Electronically Signed   By: Ulyses Southward M.D.   On: 11/01/2020 10:58   DG CHEST PORT 1 VIEW  Result Date: 10/31/2020 CLINICAL DATA:  Pneumothorax, chest tube EXAM: PORTABLE CHEST 1 VIEW COMPARISON:  Portable exam 0610 hours  compared to 10/30/2020 FINDINGS: RIGHT thoracostomy tube stable. Normal heart size, mediastinal contours, and pulmonary vascularity. Persistent small RIGHT pneumothorax with adjacent RIGHT apex staple line. Remaining lungs clear. No pleural effusion or acute osseous findings. IMPRESSION: Persistent small RIGHT apex pneumothorax. Electronically Signed   By: Ulyses Southward M.D.   On: 10/31/2020 07:57   DG Chest Port 1 View  Result Date: 10/30/2020 CLINICAL DATA:  Postop lung surgery. EXAM: PORTABLE CHEST 1 VIEW COMPARISON:  10/28/2020 FINDINGS: There is a right-sided chest tube in place. No significant right-sided pneumothorax. The heart size is stable. There is no acute osseous abnormality. IMPRESSION: Chest tube in place without evidence for significant right-sided pneumothorax. Electronically Signed   By: Katherine Mantle M.D.   On: 10/30/2020 18:54   DG Chest Port 1 View  Result Date: 10/28/2020 CLINICAL DATA:  Shortness of breath with history of spontaneous pneumothorax. EXAM: PORTABLE CHEST 1 VIEW COMPARISON:  June 19, 2020 FINDINGS: A small to moderate size pneumothorax is seen along the lateral aspect of the mid and lower right hemithorax. Mild atelectasis is seen within the medial aspect of the right lung base. There is no evidence of a pleural effusion. The heart size and mediastinal contours are within normal limits. Radiopaque surgical clips are seen overlying the right upper quadrant. The visualized skeletal structures are unremarkable. IMPRESSION: Small to moderate sized right-sided pneumothorax. Electronically Signed   By: Aram Candela M.D.   On: 10/28/2020 02:49   DG Chest Port 1V same Day  Result Date: 10/28/2020 CLINICAL DATA:  Chest tube insertion, COVID-19 positive EXAM: PORTABLE CHEST 1 VIEW COMPARISON:  Radiograph 10/28/2020 FINDINGS: Patient is post placement of a right chest tube which terminates in the mid right lung towards midline. Small residual lateral right pneumothorax  and some questionable trace right effusion. No left pneumothorax or pleural fluid. Some additional coarsened interstitial opacities are present which could reflect atelectasis or atypical infection in the setting of COVID 19. Stable cardiomediastinal contours. Telemetry leads overlie the chest. IMPRESSION: Interval placement of a right chest tube with decrease in size of the previously seen right pneumothorax Small residual right pneumothorax and trace right pleural fluid remains. Some coarsened interstitial opacities, possibly atelectasis versus atypical infection in the setting of COVID-19. Electronically Signed   By: Kreg Shropshire M.D.   On: 10/28/2020 06:50

## 2020-11-13 NOTE — Progress Notes (Addendum)
      [address]       [place] [address]             [phone]      14 Days Post-Op Procedure(s) (LRB): XI ROBOTIC ASSISTED THORASCOPY-WEDGE RESECTION,UPPER LOBE AND LOWER LOBE (Right) INTERCOSTAL NERVE BLOCK (Right) PLEURECTOMY (Right) Subjective: Resting in bed. No new concerns.  Says he is breathing OK.  Objective: Vital signs in last 24 hours: Temp:  [98.3 F (36.8 C)-99.9 F (37.7 C)] 98.4 F (36.9 C) (05/16 0729) Pulse Rate:  [75-84] 79 (05/16 0729) Cardiac Rhythm: Normal sinus rhythm (05/16 0705) Resp:  [12-18] 14 (05/16 0729) BP: (91-117)/(57-86) 104/69 (05/16 0729) SpO2:  [100 %] 100 % (05/16 0729) Weight:  [67.4 kg] 67.4 kg (05/16 0329)     Intake/Output from previous day: 05/15 0701 - 05/16 0700 In: -  Out: 2190 [Urine:2190] Intake/Output this shift: No intake/output data recorded.  General appearance: alert, cooperative and no distress Heart: regular rate and rhythm Lungs: breath sounds clear Wound: clean and dry. The CT connections are secure. I do not see any evidence of air leaking around the chest tube.   Lab Results: Recent Labs    11/11/20 0048 11/12/20 0336  WBC 6.3 7.1  HGB 11.2* 11.3*  HCT 34.0* 34.9*  PLT 312 305   BMET:  Recent Labs    11/11/20 0048 11/12/20 0336  NA 136 136  K 3.7 3.6  CL 105 107  CO2 26 24  GLUCOSE 108* 137*  BUN 17 20  CREATININE 0.88 0.98  CALCIUM 8.8* 8.8*    PT/INR: No results for input(s): LABPROT, INR in the last 72 hours. ABG No results found for: PHART, HCO3, TCO2, ACIDBASEDEF, O2SAT CBG (last 3)  No results for input(s): GLUCAP in the last 72 hours.  Assessment/Plan: S/P Procedure(s) (LRB): XI ROBOTIC ASSISTED THORASCOPY-WEDGE RESECTION,UPPER LOBE AND LOWER LOBE (Right) INTERCOSTAL NERVE BLOCK (Right) PLEURECTOMY (Right)  -Postop day-14 right robotic assisted thoracoscopy with wedge resections of the upper and lower lobes for recurrent spontaneous pneumothorax.   Chest x-rays show the right lung remains fully expanded since 11/11/2020.  Since he has had recurrent pneumothorax with attempts at water seal previously this admission, will leave chest tube to suction 1 more day before progressing to  waterseal again.    LOS: 16 days    Leary Roca, [place] [phone] 11/13/2020   Agree with above. We will keep to suction 1 more day.  Mariyam Remington Keane Scrape

## 2020-11-14 ENCOUNTER — Inpatient Hospital Stay (HOSPITAL_COMMUNITY): Payer: Self-pay

## 2020-11-14 NOTE — Progress Notes (Signed)
Physical Therapy Treatment Patient Details Name: Ronald Butler MRN: [id number] DOB: 20-Oct-1975 Today's Date: 11/14/2020    History of Present Illness 45 yo male adm 0/76 from police custody with SOB and chest pain. 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  Found incidental Covid. On 5/2 pt had wedge resection of rt upper and lower lobes, apical pleurectomy, and mechanical pleurodesis. Chest tube in place.  PMHx: pancreatitis, recurrent pneumothorax, seizures, cholecystectomy, smoker, THC    PT Comments    Patient received in bed, pleasant but very anxious and continues to complain of pain. Asks me multiple times "if the doctors are trying to make me as uncomfortable as possible" and why his tube can't come out, I encouraged him to discuss with MDs. Perseverated on chest tube entire session. Sat EOB and allowed me to get supine and sitting BP, however refused to try standing or walking today due to chest tube related pain and nausea. Discussed with RN. Left in bed with all needs met, police officer outside door. Will continue efforts.     Follow Up Recommendations  Supervision - Intermittent     Equipment Recommendations  Rolling walker with 5" wheels    Recommendations for Other Services       Precautions / Restrictions Precautions Precautions: Fall;Other (comment) Precaution Comments: chest tube (has been going back and forth between water seal and suction) Restrictions Weight Bearing Restrictions: No    Mobility  Bed Mobility Overal bed mobility: Needs Assistance Bed Mobility: Supine to Sit;Sit to Supine     Supine to sit: Independent;HOB elevated Sit to supine: Independent;HOB elevated   General bed mobility comments: HOB only mildly elevated, assist for line management    Transfers                 General transfer comment: declined- nausea and pain  Ambulation/Gait             General Gait Details: declined- nausea and  pain   Stairs             Wheelchair Mobility    Modified Rankin (Stroke Patients Only)       Balance Overall balance assessment: Mild deficits observed, not formally tested Sitting-balance support: Feet supported Sitting balance-Leahy Scale: Normal         Standing balance comment: refused today                            Cognition Arousal/Alertness: Awake/alert Behavior During Therapy: Anxious Overall Cognitive Status: Within Functional Limits for tasks assessed                                 General Comments: very anxious overall, perseverating on chest tube      Exercises      General Comments General comments (skin integrity, edema, etc.): BP soft but stable- 90s/60s in supine, 104/70s sitting EOB      Pertinent Vitals/Pain Pain Assessment: Faces Faces Pain Scale: Hurts whole lot Pain Location: chest tube site and incision Pain Descriptors / Indicators: Operative site guarding;Grimacing Pain Intervention(s): Limited activity within patient's tolerance;Monitored during session    Home Living                      Prior Function            PT Goals (current goals can now be found  in the care plan section) Acute Rehab PT Goals Patient Stated Goal: to get pain managed PT Goal Formulation: With patient Time For Goal Achievement: 11/23/20 Potential to Achieve Goals: Good Progress towards PT goals: Not progressing toward goals - comment (limited by pain)    Frequency    Min 3X/week      PT Plan Current plan remains appropriate    Co-evaluation              AM-PAC PT "6 Clicks" Mobility   Outcome Measure  Help needed turning from your back to your side while in a flat bed without using bedrails?: None Help needed moving from lying on your back to sitting on the side of a flat bed without using bedrails?: None Help needed moving to and from a bed to a chair (including a wheelchair)?: A Little Help  needed standing up from a chair using your arms (e.g., wheelchair or bedside chair)?: A Little Help needed to walk in hospital room?: A Little Help needed climbing 3-5 steps with a railing? : A Little 6 Click Score: 20    End of Session   Activity Tolerance: Patient limited by pain;Other (comment) (and nausea) Patient left: in bed;with call bell/phone within reach;Other (comment) (Engineer, structural outside room) Nurse Communication: Mobility status;Other (comment) (pain/nausea) PT Visit Diagnosis: Difficulty in walking, not elsewhere classified (R26.2);Pain Pain - Right/Left: Right Pain - part of body:  (chest wall)     Time: 2552-5894 PT Time Calculation (min) (ACUTE ONLY): 13 min  Charges:  $Therapeutic Activity: 8-22 mins                     Windell Norfolk, DPT, PN1   Supplemental Physical Therapist [place]    Pager [phone] Acute Rehab Office [phone]

## 2020-11-14 NOTE — Progress Notes (Addendum)
      [address]       [place] [address]             [phone]       15 Days Post-Op Procedure(s) (LRB): XI ROBOTIC ASSISTED THORASCOPY-WEDGE RESECTION,UPPER LOBE AND LOWER LOBE (Right) INTERCOSTAL NERVE BLOCK (Right) PLEURECTOMY (Right)  Subjective: Patient without specific complaint this am  Objective: Vital signs in last 24 hours: Temp:  [98.4 F (36.9 C)-98.6 F (37 C)] 98.4 F (36.9 C) (05/17 0333) Pulse Rate:  [77-98] 77 (05/17 0333) Cardiac Rhythm: Normal sinus rhythm (05/16 1900) Resp:  [15-18] 15 (05/17 0333) BP: (83-105)/(60-83) 99/83 (05/17 0333) SpO2:  [92 %-100 %] 96 % (05/17 0333)     Intake/Output from previous day: 05/16 0701 - 05/17 0700 In: 420 [P.O.:420] Out: 1300 [Urine:1300]   Physical Exam:  Cardiovascular: RRR Pulmonary: Clear to auscultation bilaterally Abdomen: Soft, non tender, bowel sounds present. Wounds: Clean and dry.  No erythema or signs of infection. Chest Tube: to suction, +1 air leak  Lab Results: XFG:HWEXHB Labs    11/12/20 0336  WBC 7.1  HGB 11.3*  HCT 34.9*  PLT 305   BMET:  Recent Labs    11/12/20 0336  NA 136  K 3.6  CL 107  CO2 24  GLUCOSE 137*  BUN 20  CREATININE 0.98  CALCIUM 8.8*    PT/INR: No results for input(s): LABPROT, INR in the last 72 hours. ABG:  INR: Will add last result for INR, ABG once components are confirmed Will add last 4 CBG results once components are confirmed  Assessment/Plan:  1. CV - SR 2.  Pulmonary - Chest tube is to suction and there is a +1 air leak. CXR this am is stable (no pneumothorax). As discussed with Dr. Cliffton Asters, will place chest tube to water seal. Check CXR in am. Encourage incentive spirometer.   Lelon Huh ZimmermanPA-C 11/14/2020,7:38 AM [phone]  CT to waterseal today Corliss Skains

## 2020-11-14 NOTE — Progress Notes (Signed)
PROGRESS NOTE                                                                                                                                                                                                             Patient Demographics:    Ronald Butler, is a 45 y.o. male, DOB - January 23, 1976, WUJ:[id number]  Outpatient Primary MD for the patient is Patient, No Pcp Per (Inactive)   Admit date - 10/28/2020   LOS - 17  Chief Complaint  Patient presents with  . Shortness of Breath    States that it hurts to breathe et that he has a collapsed upper right lung       Brief Narrative: Patient is a 45 y.o. male with PMHx of recurrent spontaneous pneumothorax-previously refused VATS-polysubstance abuse, seizure disorder-apprehended by law enforcement and incarcerated-subsequently developed chest pain and shortness of breath-was found to have right-sided pneumothorax-chest tube placed-patient was then transferred to [facility] for cardiothoracic surgery evaluation.  He was also found to have an incidental COVID-19 infection.   COVID-19 vaccinated status:   Significant Events: 4/30>> transfer to [facility] from [facility]-for CTVS evaluation of recurrent spontaneous pneumothorax 5/5>> chest tube clamped-pneumothorax reoccurred with lung collapse  Significant studies: 4/30>> CT chest: Right-sided chest tube, trace pneumothorax-emphysema with prominent subpleural bulla within the right upper/lower lobes.  COVID-19 medications: None  Antibiotics: None  Microbiology data: 4/30>> COVID PCR: Positive  Procedures: 4/30 >> right-sided chest tube placement in the emergency room at [facility] 5/2>> right thoracoscopy-wedge resection  Consults: Cardiothoracic surgery  DVT prophylaxis: apixaban (ELIQUIS) tablet 2.5 mg Start: 11/03/20 1330 Place and maintain sequential compression device Start: 11/03/20 0707 SCD's Start: 10/30/20 1642     Subjective:   Patient admitted sleeping,, no distress, no headache or shortness of breath, +ve right-sided chest tube site discomfort   Assessment  & Plan :   Right-sided pneumothorax-s/p VATS with wedge resection on 10/30/20: Cardiothoracic surgery following-defer care to cardiothoracic surgery. Appears comfortable, will start titrating Narcotics down gradually, definite Narcotic seeking behavior.  Case discussed with Dr. Cliffton Asters on 11/13/2020.  Pneumothorax on 11/14/2020 seems to have resolved, chest has been placed on waterseal if remains stable may come out on 11/16/2018  COVID-19 infection: Incidental-unclear how long he has this infection-he has no symptoms-apart from observation-doubt any treatment required at this time.  Note-patient refused SQ  Lovenox-hence switched him to prophylactic Eliquis. Off Isolation.  Seizure disorder: Continue Depakote, Vimpat, Zonegran.  Polysubstance abuse:  UDS checked-positive both narcotics and cocaine. Counseled.  Tobacco abuse: Refused transdermal nicotine  Mild hypotension.  Hydrate PRN with IV fluids and monitor, stable baseline TSH and random cortisol.    Condition - Stable  Family Communication  : None at bedside.  Code Status :  Full Code  Diet :  Diet Order            Diet regular Room service appropriate? Yes; Fluid consistency: Thin  Diet effective now                  Disposition Plan  :    Status is: Inpatient  Remains inpatient appropriate because:Inpatient level of care appropriate due to severity of illness   Dispo: [facility]              Anticipated d/c is to: Prison              Patient currently is not medically stable to d/c.   Difficult to place patient No  Barriers to discharge: Chest tube in place  Antimicorbials  :    Anti-infectives (From admission, onward)   Start     Dose/Rate Route Frequency Ordered Stop   10/30/20 2130  ceFAZolin (ANCEF) IVPB 2g/100 mL premix        2 g 200  mL/hr over 30 Minutes Intravenous Every 8 hours 10/30/20 1641 10/31/20 0719      Inpatient Medications  Scheduled Meds: . acetaminophen  1,000 mg Oral Q8H  . apixaban  2.5 mg Oral BID  . bisacodyl  10 mg Oral Daily  . divalproex  500 mg Oral Daily  . divalproex  750 mg Oral QHS  . feeding supplement  237 mL Oral BID BM  . lacosamide  100 mg Oral BID  . polyethylene glycol  17 g Oral Daily  . senna-docusate  1 tablet Oral QHS  . zonisamide  300 mg Oral QHS   Continuous Infusions:  PRN Meds:.HYDROmorphone (DILAUDID) injection, [DISCONTINUED] ondansetron **OR** ondansetron (ZOFRAN) IV, oxyCODONE   Time Spent in minutes  15  See all Orders from today for further details   Susa RaringPrashant Brazos Sandoval M.D on 11/14/2020 at 9:01 AM  To page go to [email or web address] - use universal password  [facility] -  Office  [phone]    Objective:   Vitals:   11/13/20 1943 11/13/20 2332 11/14/20 0333 11/14/20 0754  BP: 105/74 103/70 99/83 92/65   Pulse: 80 81 77 72  Resp: 17 15 15 17   Temp: 98.5 F (36.9 C) 98.6 F (37 C) 98.4 F (36.9 C) 98.4 F (36.9 C)  TempSrc: Oral Oral Oral Oral  SpO2: 92% 98% 96% 97%  Weight:        Wt Readings from Last 3 Encounters:  11/13/20 67.4 kg  06/19/20 74.8 kg  05/30/20 75 kg     Intake/Output Summary (Last 24 hours) at 11/14/2020 0901 Last data filed at 11/13/2020 2010 Gross per 24 hour  Intake 300 ml  Output 1300 ml  Net -1000 ml     Physical Exam  Patient in bed sleeping, woken up in no distress,  Iron Post.AT,PERRAL Supple Neck,No JVD, No cervical lymphadenopathy appriciated.  Symmetrical Chest wall movement, Good air movement bilaterally, right-sided chest tube RRR,No Gallops, Rubs or new Murmurs, No Parasternal Heave +ve B.Sounds, Abd Soft, No tenderness, No organomegaly appriciated, No rebound - guarding or rigidity.  No Cyanosis, Clubbing or edema, No new Rash or bruise      Data Review:    Recent Labs  Lab 11/08/20 0109  11/09/20 0114 11/10/20 0155 11/11/20 0048 11/12/20 0336  WBC 9.4 7.7 7.4 6.3 7.1  HGB 12.4* 11.3* 11.3* 11.2* 11.3*  HCT 37.8* 34.3* 34.1* 34.0* 34.9*  PLT 368 341 287 312 305  MCV 91.3 90.7 92.4 91.4 92.3  MCH 30.0 29.9 30.6 30.1 29.9  MCHC 32.8 32.9 33.1 32.9 32.4  RDW 12.0 12.0 12.1 12.2 12.2  LYMPHSABS 1.9 2.1 1.9 2.1 1.9  MONOABS 1.0 1.1* 1.0 0.8 0.8  EOSABS 0.2 0.2 0.2 0.2 0.2  BASOSABS 0.0 0.0 0.1 0.1 0.1    Recent Labs  Lab 11/07/20 1234 11/08/20 0109 11/09/20 0114 11/10/20 0155 11/11/20 0048 11/12/20 0336  NA  --  135 135 136 136 136  K  --  3.7 3.6 3.9 3.7 3.6  CL  --  106 103 103 105 107  CO2  --  23 25 23 26 24   GLUCOSE  --  123* 115* 104* 108* 137*  BUN  --  17 18 20 17 20   CREATININE  --  0.84 0.87 0.89 0.88 0.98  CALCIUM  --  9.3 8.9 8.9 8.8* 8.8*  AST  --  9* 9* 10* 8* 10*  ALT  --  8 8 8 8 7   ALKPHOS  --  75 75 75 74 84  BILITOT  --  0.1* 0.3 0.4 0.4 0.5  ALBUMIN  --  3.1* 3.0* 3.1* 3.0* 3.1*  MG  --  2.0 1.9 1.9 2.0 2.0  PROCALCITON  --  <0.10 <0.10 <0.10 <0.10 <0.10  TSH 1.717  --   --   --   --   --   BNP  --  5.2 3.8 5.2 4.2 4.4       Radiology Reports CT CHEST WO CONTRAST  Result Date: 10/28/2020 CLINICAL DATA:  Pneumothorax, COVID-19 positive, chest tube EXAM: CT CHEST WITHOUT CONTRAST TECHNIQUE: Multidetector CT imaging of the chest was performed following the standard protocol without IV contrast. COMPARISON:  10/28/2020 FINDINGS: Cardiovascular: Unenhanced imaging of the heart and great vessels demonstrates no pericardial effusion. Normal caliber of the thoracic aorta. Mediastinum/Nodes: No enlarged mediastinal or axillary lymph nodes. Thyroid gland, trachea, and esophagus demonstrate no significant findings. Lungs/Pleura: There is an indwelling right-sided chest tube via a right anterolateral approach. Trace residual right basilar pneumothorax is identified, volume estimated far less than 5%. Emphysematous changes are seen, upper lobe  predominant, with numerous subpleural bullous seen within the right lower and right upper lobes. Dependent consolidation within the right lower lobe consistent with atelectasis. Trace right pleural effusion. Central airways are patent. Upper Abdomen: No acute abnormality. Musculoskeletal: No acute or destructive bony lesions. Reconstructed images demonstrate no additional findings. IMPRESSION: 1. Right-sided chest tube as above, with trace residual right basilar pneumothorax volume estimated less than 5%. 2. Emphysema, with prominent subpleural bulla within the right upper and right lower lobes. 3. Minimal dependent lower lobe atelectasis, with trace right pleural effusion. Electronically Signed   By: M.D.   On: 10/28/2020 16:08   DG CHEST PORT 1 VIEW  Result Date: 11/14/2020 CLINICAL DATA:  Shortness of breath.  Chest tube present. EXAM: PORTABLE CHEST 1 VIEW COMPARISON:  Nov 13, 2020 FINDINGS: Chest tube position unchanged on the right. No evident pneumothorax. No edema or airspace opacity. Heart size and pulmonary vascularity are normal. No adenopathy. No bone lesions.  IMPRESSION: Chest tube position on the right unchanged. No pneumothorax evident. No edema or airspace opacity. Cardiac silhouette within normal limits. Electronically Signed   By: Bretta Bang III M.D.   On: 11/14/2020 07:51   DG Chest Port 1 View  Result Date: 11/13/2020 CLINICAL DATA:  Chest tube EXAM: PORTABLE CHEST 1 VIEW COMPARISON:  11/12/2020 FINDINGS: The heart size and mediastinal contours are within normal limits. Right-sided chest tube remains in position, tip about the right pulmonary apex. No appreciable residual pneumothorax. Both lungs are clear. The visualized skeletal structures are unremarkable. IMPRESSION: Right-sided chest tube remains in position. No appreciable residual pneumothorax. No acute abnormality of the lungs. Electronically Signed   By: Lauralyn Primes M.D.   On: 11/13/2020 08:37   DG Chest  Port 1 View  Result Date: 11/12/2020 CLINICAL DATA:  Follow-up RIGHT pneumothorax with chest tube in place. EXAM: PORTABLE CHEST 1 VIEW COMPARISON:  11/11/2020 and earlier, including CT chest 10/28/2020. FINDINGS: RIGHT chest tube in place with no evidence of a residual RIGHT pneumothorax. Bullous emphysematous changes in both lungs as noted previously. Lungs clear. Bronchovascular markings normal. No pleural effusions. Cardiomediastinal silhouette unremarkable and unchanged. IMPRESSION: 1. No evidence of residual RIGHT pneumothorax. 2. No acute cardiopulmonary disease. 3.  Emphysema. ([id number]) Electronically Signed   By: Hulan Saas M.D.   On: 11/12/2020 09:23   DG Chest Port 1 View  Result Date: 11/11/2020 CLINICAL DATA:  Right chest tube placement EXAM: PORTABLE CHEST 1 VIEW COMPARISON:  11/10/2020 FINDINGS: Stable position of right chest tube. The small right-sided pneumothorax has decreased in volume compared with the previous exam and is no longer perceptible. No pleural effusion or edema. No airspace densities. IMPRESSION: Decrease in volume of right-sided pneumothorax. The previously noted pneumothorax is no longer visualized. Electronically Signed   By: Signa Kell M.D.   On: 11/11/2020 08:51   DG Chest Port 1 View  Result Date: 11/10/2020 CLINICAL DATA:  Chest tube.  Air leak. EXAM: PORTABLE CHEST 1 VIEW COMPARISON:  Chest x-Percell dated 11/09/2020. FINDINGS: RIGHT-sided chest tube is stable in position with tip directed towards the RIGHT lung apex. Stable appearance of the RIGHT-sided pneumothorax, small to moderate in size. LEFT lung is clear. No pleural effusion is seen. Heart size and mediastinal contours are within normal limits. IMPRESSION: 1. Stable appearance of the RIGHT-sided pneumothorax, small to moderate in size. Stable position of the RIGHT-sided chest tube. 2. LEFT lung is clear. Electronically Signed   By: Bary Richard M.D.   On: 11/10/2020 08:01   DG Chest Port 1  View  Result Date: 11/09/2020 CLINICAL DATA:  Pneumothorax Chest tube EXAM: PORTABLE CHEST 1 VIEW COMPARISON:  11/08/2020 FINDINGS: Heart size within normal limits. Small right pneumothorax similar in size to prior examination. Right apical chest tube unchanged position. ACDF changes partially visualized in the lower cervical spine. IMPRESSION: Unchanged small right apical pneumothorax. Electronically Signed   By: Acquanetta Belling M.D.   On: 11/09/2020 07:47   DG CHEST PORT 1 VIEW  Result Date: 11/08/2020 CLINICAL DATA:  Pneumothorax, chest tube EXAM: PORTABLE CHEST 1 VIEW COMPARISON:  11/07/2020 FINDINGS: Right chest tube remains in place with small right pneumothorax, stable. No confluent opacities. Left lung clear. Heart is normal size. IMPRESSION: Stable small right pneumothorax and right chest tube. Electronically Signed   By: Charlett Nose M.D.   On: 11/08/2020 08:45   DG CHEST PORT 1 VIEW  Result Date: 11/07/2020 CLINICAL DATA:  Pneumothorax, chest tube, COVID EXAM: PORTABLE  CHEST 1 VIEW COMPARISON:  Chest x-Veer 11/07/2018 FINDINGS: The heart size and mediastinal contours are within normal limits. No focal consolidation. No pulmonary edema. No pleural effusion. Interval increase in a at least small volume right pneumothorax. Right chest tube in stable position. No left pneumothorax. No acute osseous abnormality. IMPRESSION: Interval increase in size of an at least small volume right pneumothorax with right chest tube in stable position. Electronically Signed   By: Tish Frederickson M.D.   On: 11/07/2020 06:41   DG CHEST PORT 1 VIEW  Result Date: 11/06/2020 CLINICAL DATA:  Pneumothorax, chest tube EXAM: PORTABLE CHEST 1 VIEW COMPARISON:  Portable exam 0627 hours compared to 11/05/2020 FINDINGS: RIGHT thoracostomy tube unchanged. Normal heart size, mediastinal contours, and pulmonary vascularity. Lungs hyperinflated but clear. No pleural effusion seen. Persistent small RIGHT pneumothorax. IMPRESSION:  Persistent small RIGHT pneumothorax despite thoracostomy tube. Electronically Signed   By: Ulyses Southward M.D.   On: 11/06/2020 08:15   DG CHEST PORT 1 VIEW  Result Date: 11/05/2020 CLINICAL DATA:  Chest pain. Positive COVID-19. Right-sided chest tube/pneumothorax. Follow-up exam. EXAM: PORTABLE CHEST 1 VIEW COMPARISON:  11/04/2020 and older studies. FINDINGS: Significant decrease in the size of the right pneumothorax, now with only a minimal right lateral pneumothorax persisting. Stable right chest tube. Lungs are hyperexpanded, but clear. IMPRESSION: 1. Significant decrease in the right pneumothorax, with a minimal pneumothorax persisting. 2. Stable right chest tube.  Lungs remain clear. Electronically Signed   By: Amie Portland M.D.   On: 11/05/2020 09:11   DG CHEST PORT 1 VIEW  Result Date: 11/04/2020 CLINICAL DATA:  Right-sided pneumothorax with chest tube in place EXAM: PORTABLE CHEST 1 VIEW COMPARISON:  Prior chest x-Zakery yesterday 11/03/2020 FINDINGS: Stable and well positioned right-sided thoracostomy tube. Persistent and slightly increased right-sided pneumothorax. Associated atelectasis in the right lower lobe. The left lung remains clear. Cardiac and mediastinal contours remain clear. No acute osseous abnormality. IMPRESSION: Persistent and perhaps slightly enlarged right-sided pneumothorax with well-positioned chest tube in place. Electronically Signed   By: Malachy Moan M.D.   On: 11/04/2020 09:17   DG Chest Port 1 View  Result Date: 11/03/2020 CLINICAL DATA:  Pneumothorax with chest tube in place EXAM: PORTABLE CHEST 1 VIEW COMPARISON:  Nov 02, 2020 FINDINGS: Chest tube present on the right. Pneumothorax is noted on the right in the lateral and apical regions, considerably smaller compared to 1 day prior. No tension component. Postoperative change noted on the right. There are areas of scattered mild scarring in the right base. There is no edema or airspace opacity. Heart size and pulmonary  vascularity are normal. No adenopathy. No bone lesions. Postoperative change noted in lower cervical region. IMPRESSION: Chest tube remains on the right. Right pneumothorax considerably smaller compared to 1 day prior, although pneumothorax is evident in the apical and lateral aspects on the right. No tension component. Mild scarring right base. No edema or airspace opacity. Cardiac silhouette normal. Electronically Signed   By: Bretta Bang III M.D.   On: 11/03/2020 09:15   DG Chest Port 1 View  Result Date: 11/02/2020 CLINICAL DATA:  Shortness of breath, post clamping of chest tube EXAM: PORTABLE CHEST 1 VIEW COMPARISON:  Portable exam 1610 hours compared to 0629 hours FINDINGS: RIGHT thoracostomy tube unchanged. Subtotal collapse of RIGHT lung. No definite mediastinal shift. Heart size normal. LEFT lung clear. IMPRESSION: Subtotal collapse of RIGHT lung despite presence of RIGHT thoracostomy tube. No definite mediastinal shift. LEFT lung clear. Critical Value/emergent results  were called by telephone at the time of interpretation on 11/02/2020 at 7:04 pm to provider Brynda Greathouse MD, who verbally acknowledged these results. Electronically Signed   By: Ulyses Southward M.D.   On: 11/02/2020 19:04   DG CHEST PORT 1 VIEW  Result Date: 11/02/2020 CLINICAL DATA:  Shortness of breath. EXAM: PORTABLE CHEST 1 VIEW COMPARISON:  11/01/2020. FINDINGS: Right chest tube in stable position. Interim increased right pneumothorax. Right pneumothorax remains small. Postsurgical changes right lung. No pleural effusion. Heart size normal. IMPRESSION: Right chest tube in stable position. Interim increase in right-sided pneumothorax. Right mid thorax remains small. Postsurgical changes right lung. Electronically Signed   By: Maisie Fus  Register   On: 11/02/2020 07:49   DG CHEST PORT 1 VIEW  Result Date: 11/01/2020 CLINICAL DATA:  Pneumothorax, chest tube, COVID-19 positive EXAM: PORTABLE CHEST 1 VIEW COMPARISON:  Portable exam  0906 hours compared to 10/31/2020 FINDINGS: RIGHT thoracostomy tube again seen. Tiny RIGHT pneumothorax, slightly decreased. Normal heart size, mediastinal contours, and pulmonary vascularity. Lungs grossly clear. Staple line at RIGHT apex. No pleural effusion or acute osseous findings. IMPRESSION: Slight decrease in tiny RIGHT pneumothorax. Electronically Signed   By: Ulyses Southward M.D.   On: 11/01/2020 10:58   DG CHEST PORT 1 VIEW  Result Date: 10/31/2020 CLINICAL DATA:  Pneumothorax, chest tube EXAM: PORTABLE CHEST 1 VIEW COMPARISON:  Portable exam 0610 hours compared to 10/30/2020 FINDINGS: RIGHT thoracostomy tube stable. Normal heart size, mediastinal contours, and pulmonary vascularity. Persistent small RIGHT pneumothorax with adjacent RIGHT apex staple line. Remaining lungs clear. No pleural effusion or acute osseous findings. IMPRESSION: Persistent small RIGHT apex pneumothorax. Electronically Signed   By: Ulyses Southward M.D.   On: 10/31/2020 07:57   DG Chest Port 1 View  Result Date: 10/30/2020 CLINICAL DATA:  Postop lung surgery. EXAM: PORTABLE CHEST 1 VIEW COMPARISON:  10/28/2020 FINDINGS: There is a right-sided chest tube in place. No significant right-sided pneumothorax. The heart size is stable. There is no acute osseous abnormality. IMPRESSION: Chest tube in place without evidence for significant right-sided pneumothorax. Electronically Signed   By: Katherine Mantle M.D.   On: 10/30/2020 18:54   DG Chest Port 1 View  Result Date: 10/28/2020 CLINICAL DATA:  Shortness of breath with history of spontaneous pneumothorax. EXAM: PORTABLE CHEST 1 VIEW COMPARISON:  June 19, 2020 FINDINGS: A small to moderate size pneumothorax is seen along the lateral aspect of the mid and lower right hemithorax. Mild atelectasis is seen within the medial aspect of the right lung base. There is no evidence of a pleural effusion. The heart size and mediastinal contours are within normal limits. Radiopaque surgical  clips are seen overlying the right upper quadrant. The visualized skeletal structures are unremarkable. IMPRESSION: Small to moderate sized right-sided pneumothorax. Electronically Signed   By: Aram Candela M.D.   On: 10/28/2020 02:49   DG Chest Port 1V same Day  Result Date: 10/28/2020 CLINICAL DATA:  Chest tube insertion, COVID-19 positive EXAM: PORTABLE CHEST 1 VIEW COMPARISON:  Radiograph 10/28/2020 FINDINGS: Patient is post placement of a right chest tube which terminates in the mid right lung towards midline. Small residual lateral right pneumothorax and some questionable trace right effusion. No left pneumothorax or pleural fluid. Some additional coarsened interstitial opacities are present which could reflect atelectasis or atypical infection in the setting of COVID 19. Stable cardiomediastinal contours. Telemetry leads overlie the chest. IMPRESSION: Interval placement of a right chest tube with decrease in size of the previously seen right  pneumothorax Small residual right pneumothorax and trace right pleural fluid remains. Some coarsened interstitial opacities, possibly atelectasis versus atypical infection in the setting of COVID-19. Electronically Signed   By: Kreg Shropshire M.D.   On: 10/28/2020 06:50

## 2020-11-15 ENCOUNTER — Inpatient Hospital Stay (HOSPITAL_COMMUNITY): Payer: Self-pay

## 2020-11-15 DIAGNOSIS — U071 COVID-19: Secondary | ICD-10-CM

## 2020-11-15 LAB — CBC WITH DIFFERENTIAL/PLATELET
Abs Immature Granulocytes: 0.03 10*3/uL (ref 0.00–0.07)
Basophils Absolute: 0.1 10*3/uL (ref 0.0–0.1)
Basophils Relative: 1 %
Eosinophils Absolute: 0.2 10*3/uL (ref 0.0–0.5)
Eosinophils Relative: 2 %
HCT: 35.5 % — ABNORMAL LOW (ref 39.0–52.0)
Hemoglobin: 11.9 g/dL — ABNORMAL LOW (ref 13.0–17.0)
Immature Granulocytes: 0 %
Lymphocytes Relative: 24 %
Lymphs Abs: 2.2 10*3/uL (ref 0.7–4.0)
MCH: 30.5 pg (ref 26.0–34.0)
MCHC: 33.5 g/dL (ref 30.0–36.0)
MCV: 91 fL (ref 80.0–100.0)
Monocytes Absolute: 0.8 10*3/uL (ref 0.1–1.0)
Monocytes Relative: 9 %
Neutro Abs: 5.8 10*3/uL (ref 1.7–7.7)
Neutrophils Relative %: 64 %
Platelets: 291 10*3/uL (ref 150–400)
RBC: 3.9 MIL/uL — ABNORMAL LOW (ref 4.22–5.81)
RDW: 12.5 % (ref 11.5–15.5)
WBC: 9 10*3/uL (ref 4.0–10.5)
nRBC: 0 % (ref 0.0–0.2)

## 2020-11-15 LAB — VITAMIN B12: Vitamin B-12: 356 pg/mL (ref 180–914)

## 2020-11-15 LAB — FERRITIN: Ferritin: 133 ng/mL (ref 24–336)

## 2020-11-15 LAB — COMPREHENSIVE METABOLIC PANEL
ALT: 13 U/L (ref 0–44)
AST: 11 U/L — ABNORMAL LOW (ref 15–41)
Albumin: 3.2 g/dL — ABNORMAL LOW (ref 3.5–5.0)
Alkaline Phosphatase: 69 U/L (ref 38–126)
Anion gap: 6 (ref 5–15)
BUN: 18 mg/dL (ref 6–20)
CO2: 24 mmol/L (ref 22–32)
Calcium: 9 mg/dL (ref 8.9–10.3)
Chloride: 107 mmol/L (ref 98–111)
Creatinine, Ser: 0.93 mg/dL (ref 0.61–1.24)
GFR, Estimated: >60 mL/min (ref >60)
Glucose, Bld: 98 mg/dL (ref 70–99)
Potassium: 4.1 mmol/L (ref 3.5–5.1)
Sodium: 137 mmol/L (ref 135–145)
Total Bilirubin: 0.2 mg/dL — ABNORMAL LOW (ref 0.3–1.2)
Total Protein: 6.4 g/dL — ABNORMAL LOW (ref 6.5–8.1)

## 2020-11-15 LAB — IRON AND TIBC
Iron: 66 ug/dL (ref 45–182)
Saturation Ratios: 14 % — ABNORMAL LOW (ref 17.9–39.5)
TIBC: 469 ug/dL — ABNORMAL HIGH (ref 250–450)
UIBC: 403 ug/dL

## 2020-11-15 LAB — RETICULOCYTES
Immature Retic Fract: 8.5 % (ref 2.3–15.9)
RBC.: 3.87 MIL/uL — ABNORMAL LOW (ref 4.22–5.81)
Retic Count, Absolute: 73.1 10*3/uL (ref 19.0–186.0)
Retic Ct Pct: 1.9 % (ref 0.4–3.1)

## 2020-11-15 LAB — PHOSPHORUS: Phosphorus: 4 mg/dL (ref 2.5–4.6)

## 2020-11-15 LAB — MAGNESIUM: Magnesium: 2 mg/dL (ref 1.7–2.4)

## 2020-11-15 LAB — FOLATE: Folate: 4.5 ng/mL — ABNORMAL LOW (ref >5.9)

## 2020-11-15 MED ORDER — MAGNESIUM SULFATE 2 GM/50ML IV SOLN: 2.0000 g | Freq: Once | INTRAVENOUS | Status: DC

## 2020-11-15 NOTE — Progress Notes (Signed)
PROGRESS NOTE    Christan Defranco  WLN:[id number] DOB: [birth year] DOA: 10/28/2020 PCP: Patient, No Pcp Per (Inactive)   Brief Narrative: The patient is a 45 year old Caucasian male with a past medical history significant for but not limited to recurrent spontaneous pneumothorax previously refused VATS, polysubstance abuse, seizure disorder as well as other comorbidities who was apprehended by law enforcement and incarcerated and subsequently developed chest pain and shortness of breath and was found to have a right sided pneumothorax.  A chest tube was placed and he was transferred to [facility]acic surgery evaluation from [facility].  He subsequently underwent VATS with wedge resection on 10/30/2020. He was also found to be positive for COVID-19 incidentally.  On 11/02/2020 his chest tube was clamped and pneumothorax reoccurred with lung collapse.  Continues to have his chest tube to waterseal and an air leak so cardiothoracic surgery recommending continuing chest tube today.  Assessment & Plan:   Active Problems:   Pneumothorax  Right Sided Pneumothorax s/p Wedge Resection on 10/30/20 -CT Surgery following and appreciate care and further management from them -The chest tube is to waterseal and there is a 1+ to 2 air leak with cough and the chest x-Cleophus this a.m. shows trace right apical pneumothorax -Pnuemothorax improved but CT Surgery states to keep today given his airleak with cough and trace right apical pneumothorax -Cardiothoracic surgery recommending checking a CT of the chest and a chest x-Kaiven in a.m. and recommending continue incentive spirometer -PT/OT recommending Supervision and Rolling Walker with 5" Wheels  -He appears comfortable on examination and will Continue with pain control and patient has oxycodone IR 10 mg every 6 as needed for moderate pain and hydromorphone 1 mg IV every 4 as needed severe pain as well as p.o. acetaminophen 1000 mg every 8 hours  scheduled -He has been suspected to have Narcotic Seeking behavior -Continue with bowel regimen with bisacodyl 10 mg p.o. daily and with MiraLAX 17 g p.o. daily in addition to senna docusate 1 tab p.o. nightly  COVID-19 -Incidental finding and Unclear how long he has had this -Observing for now -Off of Isolation -Tested Positive 10/28/20 -Last 3 PCT's have been Negative at <0.10  -D-Dimer trended up and went from 0.85 -> 3.15 -ESR went from 3.0 -> 1.6 -> 4.6 No results for input(s): DDIMER, FERRITIN, LDH, CRP in the last 72 hours.  Lab Results  Component Value Date   SARSCOV2NAA POSITIVE (A) 10/28/2020   [place] NEGATIVE 04/26/2020   [place] NEGATIVE 04/18/2020  -SpO2: 99 % O2 Flow Rate (L/min): 2 L/min -Wean O2  -Refused sq Lovenox so was switched to Apixaban 2.5 mg po BID   Seizure Disorder -C/w Antiepileptics with divalproex thousand once p.o. daily as well as 750 mg p.o. nightly and with lacosamide 100 g p.o. twice daily and with zonisamide 300 mg p.o. nightly  Tobacco Abuse -Smoking cessation counseling given  -Reefusing a nicotine patch  Polystubstance Use -UDS was Positive for Cocaine and Opiates on Admission  -Counseling was given   Hypotension -BP remain softer -Last BP was 107/85 -Continue to Monitor and Trend -Repeat BP per Protocol   Normocytic Anemia  -Stable. Patient's Last Hgb/Hct was 11.3/34.9 and today it is 11.9/35.5 and stable -Checked Anemia Panel as he is anticoagulated with apixaban 2.5 mils p.o. twice daily and it showed an iron level of 66, U IBC of 403, TIBC 469, saturation ratios of 14%, ferritin of 133 and folate of 4.5 as well as vitamin  B12 level 356 -Continue to Monitor for S/Sx of Bleeding; No overt bleeding noted -Repeat CBC intermittently   Hyperglycemia -Mild and Reactively likely -Blood Sugars have been ranging from 98-137 on Daily CMP's -Continue to Monitor and Trend intermittently -Last HbA1c was 5.5 in October 2021  DVT  prophylaxis: Anticoagulated with Eliquis 2.5 mg p.o. twice daily Code Status: FULL CODE  Family Communication: No family present at bedside  Disposition Plan: Pending further Clinical Improvement and Clearance by CT Surgery and removal of Chest Tube  Status is: Inpatient  Remains inpatient appropriate because:Unsafe d/c plan, IV treatments appropriate due to intensity of illness or inability to take PO and Inpatient level of care appropriate due to severity of illness   Dispo: The patient is from: [place]              Anticipated d/c is to: [place]              Patient currently is not medically stable to d/c.   Difficult to place patient No  Consultants:   Cardiothoracic Surgery    Procedures: VATS with Wedge Resection  Antimicrobials:  Anti-infectives (From admission, onward)   Start     Dose/Rate Route Frequency Ordered Stop   10/30/20 2130  ceFAZolin (ANCEF) IVPB 2g/100 mL premix        2 g 200 mL/hr over 30 Minutes Intravenous Every 8 hours 10/30/20 1641 10/31/20 0719        Subjective: Seen and examined at bedside and was still having some pain on the right side.  No nausea or vomiting.  Denies any chest pain or shortness breath.  No lightheadedness or dizziness.  No other concerns or complaints at this time.  Objective: Vitals:   11/14/20 1900 11/14/20 2346 11/15/20 0359 11/15/20 0737  BP: 103/74 105/76 100/70 96/69  Pulse:  80 75 70  Resp: _0 Temp: 98.9 F (37.2 C) 98.4 F (36.9 C) 98.2 F (36.8 C) 98.9 F (37.2 C)  TempSrc: Oral Oral Oral Oral  SpO2: 100% 98% 99% 99%  Weight:        Intake/Output Summary (Last 24 hours) at 11/15/2020 0749 Last data filed at 11/15/2020 4034 Gross per 24 hour  Intake --  Output 665 ml  Net -665 ml   Filed Weights   10/28/20 0511 11/06/20 0404 11/13/20 0329  Weight: 67.4 kg 67 kg 67.4 kg   Examination: Physical Exam:  Constitutional: WN/WD Caucasian male currently in no acute distress appears calm and  comfortable Eyes: Lids and conjunctivae normal, sclerae anicteric  ENMT: External Ears, Nose appear normal. Grossly normal hearing.  Neck: Appears normal, supple, no cervical masses, normal ROM, no appreciable thyromegaly; no JVD Respiratory: Diminished to auscultation bilaterally with coarse breath sounds, no wheezing, rales, rhonchi or crackles. Normal respiratory effort and patient is not tachypenic. No accessory muscle use and has a Right sided Chest Tube   Cardiovascular: RRR, no murmurs / rubs / gallops. S1 and S2 auscultated. No extremity edema. 2+ pedal pulses. No carotid bruits.  Abdomen: Soft, non-tender, non-distended. Bowel sounds positive.  GU: Deferred. Musculoskeletal: No clubbing / cyanosis of digits/nails. No joint deformity upper and lower extremities.  Skin: No rashes, lesions, ulcers on a limited skin evaluation. No induration; Warm and dry.  Neurologic: CN 2-12 grossly intact with no focal deficits. Romberg sign and cerebellar reflexes not assessed.  Psychiatric: Normal judgment and insight. Alert and oriented x 3. Normal mood and appropriate affect.   Data Reviewed: I  have personally reviewed following labs and imaging studies  CBC: Recent Labs  Lab 11/09/20 0114 11/10/20 0155 11/11/20 0048 11/12/20 0336  WBC 7.7 7.4 6.3 7.1  NEUTROABS 4.3 4.2 3.1 4.2  HGB 11.3* 11.3* 11.2* 11.3*  HCT 34.3* 34.1* 34.0* 34.9*  MCV 90.7 92.4 91.4 92.3  PLT 341 287 312 254   Basic Metabolic Panel: Recent Labs  Lab 11/09/20 0114 11/10/20 0155 11/11/20 0048 11/12/20 0336  NA 135 136 136 136  K 3.6 3.9 3.7 3.6  CL 103 103 105 107  CO2 _0 GLUCOSE 115* 104* 108* 137*  BUN _1 CREATININE 0.87 0.89 0.88 0.98  CALCIUM 8.9 8.9 8.8* 8.8*  MG 1.9 1.9 2.0 2.0   GFR: Estimated Creatinine Clearance: 92.7 mL/min (by C-G formula based on SCr of 0.98 mg/dL). Liver Function Tests: Recent Labs  Lab 11/09/20 0114 11/10/20 0155 11/11/20 0048 11/12/20 0336  AST  9* 10* 8* 10*  ALT _2 ALKPHOS 75 75 74 84  BILITOT 0.3 0.4 0.4 0.5  PROT 6.4* 6.1* 6.2* 6.2*  ALBUMIN 3.0* 3.1* 3.0* 3.1*   Recent Labs  Lab 11/09/20 0114 11/10/20 0155 11/11/20 0048 11/12/20 0336  LIPASE _3 No results for input(s): AMMONIA in the last 168 hours. Coagulation Profile: No results for input(s): INR, PROTIME in the last 168 hours. Cardiac Enzymes: No results for input(s): CKTOTAL, CKMB, CKMBINDEX, TROPONINI in the last 168 hours. BNP (last 3 results) No results for input(s): PROBNP in the last 8760 hours. HbA1C: No results for input(s): HGBA1C in the last 72 hours. CBG: No results for input(s): GLUCAP in the last 168 hours. Lipid Profile: No results for input(s): CHOL, HDL, LDLCALC, TRIG, CHOLHDL, LDLDIRECT in the last 72 hours. Thyroid Function Tests: No results for input(s): TSH, T4TOTAL, FREET4, T3FREE, THYROIDAB in the last 72 hours. Anemia Panel: No results for input(s): VITAMINB12, FOLATE, FERRITIN, TIBC, IRON, RETICCTPCT in the last 72 hours. Sepsis Labs: Recent Labs  Lab 11/09/20 0114 11/10/20 0155 11/11/20 0048 11/12/20 0336  PROCALCITON <0.10 <0.10 <0.10 <0.10    No results found for this or any previous visit (from the past 240 hour(s)).   RN Pressure Injury Documentation:     Estimated body mass index is 20.72 kg/m as calculated from the following:   Height as of 06/19/20: _4  (1.803 m).   Weight as of this encounter: 67.4 kg.  Malnutrition Type:   Malnutrition Characteristics:    Nutrition Interventions:   Radiology Studies: DG CHEST PORT 1 VIEW  Result Date: 11/14/2020 CLINICAL DATA:  Shortness of breath.  Chest tube present. EXAM: PORTABLE CHEST 1 VIEW COMPARISON:  Nov 13, 2020 FINDINGS: Chest tube position unchanged on the right. No evident pneumothorax. No edema or airspace opacity. Heart size and pulmonary vascularity are normal. No adenopathy. No bone lesions. IMPRESSION: Chest tube position on the right  unchanged. No pneumothorax evident. No edema or airspace opacity. Cardiac silhouette within normal limits. Electronically Signed   By: Lowella Grip III M.D.   On: 11/14/2020 07:51   Scheduled Meds: . acetaminophen  1,000 mg Oral Q8H  . apixaban  2.5 mg Oral BID  . bisacodyl  10 mg Oral Daily  . divalproex  500 mg Oral Daily  . divalproex  750 mg Oral QHS  . feeding supplement  237 mL Oral BID BM  . lacosamide  100 mg Oral BID  . polyethylene glycol  17  g Oral Daily  . senna-docusate  1 tablet Oral QHS  . zonisamide  300 mg Oral QHS   Continuous Infusions:   LOS: 18 days   Kerney Elbe, DO [facility] PAGER is on AMION  If 7PM-7AM, please contact night-coverage [email or web address]

## 2020-11-15 NOTE — Progress Notes (Addendum)
      [address]       [place] [address]             [phone]       16 Days Post-Op Procedure(s) (LRB): XI ROBOTIC ASSISTED THORASCOPY-WEDGE RESECTION,UPPER LOBE AND LOWER LOBE (Right) INTERCOSTAL NERVE BLOCK (Right) PLEURECTOMY (Right)  Subjective: Patient requesting pain medicaiton this am  Objective: Vital signs in last 24 hours: Temp:  [98.2 F (36.8 C)-99.6 F (37.6 C)] 98.2 F (36.8 C) (05/18 0359) Pulse Rate:  [72-94] 75 (05/18 0359) Cardiac Rhythm: Normal sinus rhythm (05/17 2100) Resp:  [11-17] 14 (05/18 0359) BP: (92-105)/(65-76) 100/70 (05/18 0359) SpO2:  [96 %-100 %] 99 % (05/18 0359)     Intake/Output from previous day: 05/17 0701 - 05/18 0700 In: -  Out: 665 [Urine:650; Chest Tube:15]   Physical Exam:  Cardiovascular: RRR Pulmonary: Clear to auscultation bilaterally Abdomen: Soft, non tender, bowel sounds present. Wounds: Clean and dry.  No erythema or signs of infection. Chest Tube: to water seal, +1-2 air leak with cough  Lab Results: CBC:No results for input(s): WBC, HGB, HCT, PLT in the last 72 hours. BMET:  No results for input(s): NA, K, CL, CO2, GLUCOSE, BUN, CREATININE, CALCIUM in the last 72 hours.  PT/INR: No results for input(s): LABPROT, INR in the last 72 hours. ABG:  INR: Will add last result for INR, ABG once components are confirmed Will add last 4 CBG results once components are confirmed  Assessment/Plan:  1. CV - SR 2.  Pulmonary - Chest tube with scant output last 24 hours. Chest tube is to water seal and there is a +1-2 air leak with cough. CXR this am shows trace right apical pneumothorax. Leave chest tube to water seal for today. As discussed with Dr. Cliffton Asters, will check CT chest and check CXR in am.. Encourage incentive spirometer.   Donielle M ZimmermanPA-C 11/15/2020,7:20 AM [phone]  Agree with above. Will obtain CT chest to check for any further bullous disease.  Ulysees Robarts Keane Scrape

## 2020-11-16 ENCOUNTER — Inpatient Hospital Stay (HOSPITAL_COMMUNITY): Payer: Self-pay

## 2020-11-16 NOTE — Progress Notes (Signed)
PT Cancellation Note  Patient Details Name: Ronald Butler MRN: [id number] DOB: [birth year]   Cancelled Treatment:    Reason Eval/Treat Not Completed: Patient declined, no reason specified politely declines PT for various reasons including already having had to move from bed to bed for CT scan, being tired, and having pain. Educated on importance of mobility and preventing mobility dysfunction while hospitalized. Will attempt to return later if time/schedule allow.    Madelaine Etienne, DPT, PN1   Supplemental Physical Therapist [facility]    Pager [phone] Acute Rehab Office [phone]

## 2020-11-16 NOTE — Progress Notes (Signed)
Patient leaving floor for CT at this time.

## 2020-11-16 NOTE — Progress Notes (Addendum)
      [address]       [place] [address]             [phone]       17 Days Post-Op Procedure(s) (LRB): XI ROBOTIC ASSISTED THORASCOPY-WEDGE RESECTION,UPPER LOBE AND LOWER LOBE (Right) INTERCOSTAL NERVE BLOCK (Right) PLEURECTOMY (Right)  Subjective:  Just returned from having the chest CT this morning. No changes in his breathing, pain is about the same and is mainly associated with the tube.   Objective: Vital signs in last 24 hours: Temp:  [98 F (36.7 C)-98.7 F (37.1 C)] 98.4 F (36.9 C) (05/19 0755) Pulse Rate:  [65-94] 65 (05/19 0755) Cardiac Rhythm: Normal sinus rhythm (05/19 0402) Resp:  [12-17] 15 (05/19 0755) BP: (98-108)/(65-85) 108/69 (05/19 0755) SpO2:  [96 %-99 %] 98 % (05/19 0755)     Intake/Output from previous day: 05/18 0701 - 05/19 0700 In: -  Out: 1260 [Urine:1250; Chest Tube:10]   Physical Exam:  Cardiovascular: RRR Pulmonary: Clear to auscultation bilaterally Wounds: Clean and dry.  No erythema or signs of infection. Chest Tube: to water seal, +1 air leak only with cough.  Connections are secure and no leaks detected at insertion site.   Lab Results: CBC: Recent Labs    11/15/20 0808  WBC 9.0  HGB 11.9*  HCT 35.5*  PLT 291   BMET:  Recent Labs    11/15/20 0808  NA 137  K 4.1  CL 107  CO2 24  GLUCOSE 98  BUN 18  CREATININE 0.93  CALCIUM 9.0    PORTABLE CHEST 1 VIEW  COMPARISON:  11/15/2020.  FINDINGS: Right chest tube in stable position. Persistent right base pneumothorax. Postsurgical changes right lung. No focal infiltrate. No pleural effusion. No acute bony abnormality. Prior cervical spine fusion. Surgical clips upper abdomen.  IMPRESSION: Right chest tube in stable position. Persistent right base pneumothorax.   Electronically Signed   By: Maisie Fus  Register   On: 11/16/2020 05:55  Assessment/Plan:  1. CV - SR 2.  Pulmonary - Chest tube with minimal output. Chest tube  remains to water seal and the small 1+ air leak was only present with cough. CXR this am shows resolution of the right apical pneumothorax but there is a small basilar PTX. CT chest result is pending but images were reviewed. There is no obvious residual bullous disease. There is a small inferior / medial PTX.   Leave chest tube to water seal for today.  Encourage incentive spirometer.   Ronald Butler 11/16/2020,8:12 AM [phone]  Clamping trial tomorrow.  Hopefully can remove chest tube  Ronald Butler Ronald Butler

## 2020-11-16 NOTE — Progress Notes (Signed)
PROGRESS NOTE    Ronald Butler  EYC:[id number] DOB: Dec 14, 1975 DOA: 10/28/2020 PCP: Patient, No Pcp Per (Inactive)   Brief Narrative: The patient is a 45 year old Caucasian male with a past medical history significant for but not limited to recurrent spontaneous pneumothorax previously refused VATS, polysubstance abuse, seizure disorder as well as other comorbidities who was apprehended by law enforcement and incarcerated and subsequently developed chest pain and shortness of breath and was found to have a right sided pneumothorax.  A chest tube was placed and he was transferred to [facility] for cardiothoracic surgery evaluation from [facility].  He subsequently underwent VATS with wedge resection on 10/30/2020. He was also found to be positive for COVID-19 incidentally.  On 11/02/2020 his chest tube was clamped and pneumothorax reoccurred with lung collapse.  Continues to have his chest tube to waterseal and an air leak so cardiothoracic surgery recommending continuing chest tube today.  Repeat CXR on 11/16/20 shows "Right chest tube in stable position. Persistent right base pneumothorax." Patient going for a CT Chest to check for any further bullous disease today   Assessment & Plan:   Active Problems:   Pneumothorax  Right Sided Pneumothorax s/p Wedge Resection on 10/30/20 -CT Surgery following and appreciate care and further management from them -The chest tube is to waterseal and there is a 1+ to 2 air leak with cough and the CXR this AM showed Right Chest Tube is is in stable position with persistent Right Base Pneumothorax; CT Surgery again recommends leaving to Waterseal today  -Pnuemothorax improved but CT Surgery states to keep today given his airleak with cough and trace right apical pneumothorax -Cardiothoracic surgery recommending checking a CT of the chest today and it was done and showed "Small residual right basilar pneumothorax also with incomplete re-expansion of the  superior segment of the right lower lobe despite well-positioned thoracostomy tube. Evidence of prior surgical resection of pulmonary blebs at the right lung apex and superior segment of the right lower lobe. Three small blebs present along the anterior aspect of the right lung apex. Mild centrilobular pulmonary emphysema." -Will need to repeat chest x-[place] in a.m. and recommending continue incentive spirometer -PT/OT recommending Supervision and Rolling Walker with 5" Wheels  -He appears comfortable on examination and will Continue with pain control and patient has oxycodone IR 10 mg every 6 as needed for moderate pain and hydromorphone 1 mg IV every 4 as needed severe pain as well as p.o. acetaminophen 1000 mg every 8 hours scheduled -He has been suspected to have Narcotic Seeking behavior -Continue with bowel regimen with bisacodyl 10 mg p.o. daily and with MiraLAX 17 g p.o. daily in addition to senna docusate 1 tab p.o. nightly  COVID-19 -Incidental finding and Unclear how long he has had this -Observing for now -Off of Isolation -Tested Positive 10/28/20 -Last 3 PCT's have been Negative at <0.10  -D-Dimer trended up and went from 0.85 -> 3.15 -ESR went from 3.0 -> 1.6 -> 4.6 Recent Labs    11/15/20 0808  FERRITIN 133    Lab Results  Component Value Date   SARSCOV2NAA POSITIVE (A) 10/28/2020   [place] NEGATIVE 04/26/2020   [place] NEGATIVE 04/18/2020  -SpO2: 98 % O2 Flow Rate (L/min): 2 L/min -Wean O2  -Refused sq Lovenox so was switched to Apixaban 2.5 mg po BID   Seizure Disorder -C/w Antiepileptics with divalproex thousand once p.o. daily as well as 750 mg p.o. nightly and with lacosamide 100 g p.o. twice  daily and with zonisamide 300 mg p.o. nightly  Tobacco Abuse -Smoking cessation counseling given  -Reefusing a nicotine patch  Polystubstance Use -UDS was Positive for Cocaine and Opiates on Admission  -Counseling was given   Hypotension -BP remain  softer -Last BP was 108/69 -Continue to Monitor and Trend -Repeat BP per Protocol   Normocytic Anemia  -Stable. Patient's Last Hgb/Hct was 11.3/34.9 and yesterday it was11.9/35.5 and stable  -Checked Anemia Panel as he is anticoagulated with apixaban 2.5 mils p.o. twice daily and it showed an iron level of 66, U IBC of 403, TIBC 469, saturation ratios of 14%, ferritin of 133 and folate of 4.5 as well as vitamin B12 level 356 -Continue to Monitor for S/Sx of Bleeding; No overt bleeding noted -Repeat CBC intermittently   Hyperglycemia -Mild and Reactively likely -Blood Sugars have been ranging from 98-137 on Daily CMP's -Continue to Monitor and Trend intermittently -Last HbA1c was 5.5 in October 2021  DVT prophylaxis: Anticoagulated with Eliquis 2.5 mg p.o. twice daily Code Status: FULL CODE  Family Communication: No family present at bedside  Disposition Plan: Pending further Clinical Improvement and Clearance by CT Surgery and removal of Chest Tube  Status is: Inpatient  Remains inpatient appropriate because:Unsafe d/c plan, IV treatments appropriate due to intensity of illness or inability to take PO and Inpatient level of care appropriate due to severity of illness   Dispo: The patient is from: [place]              Anticipated d/c is to: [place]              Patient currently is not medically stable to d/c.   Difficult to place patient No  Consultants:   Cardiothoracic Surgery    Procedures: VATS with Wedge Resection  Antimicrobials:  Anti-infectives (From admission, onward)   Start     Dose/Rate Route Frequency Ordered Stop   10/30/20 2130  ceFAZolin (ANCEF) IVPB 2g/100 mL premix        2 g 200 mL/hr over 30 Minutes Intravenous Every 8 hours 10/30/20 1641 10/31/20 0719        Subjective: Seen and examined at bedside and he wanted to rest and states that he did not get any sleep last night.  No nausea or vomiting.  States that he just wants to rest today and  continues to have some pain and some mild nausea.  Denies any lightheadedness or dizziness.  No other concerns or complaints at this time.  Objective: Vitals:   11/15/20 1651 11/15/20 1946 11/16/20 0009 11/16/20 0336  BP: 101/65 98/66 107/70 98/76  Pulse: 89 94 75 77  Resp: _0 Temp: 98.7 F (37.1 C) 98.7 F (37.1 C) 98.6 F (37 C) 98.1 F (36.7 C)  TempSrc: Oral Oral Oral Oral  SpO2: 97% 97% 99% 98%  Weight:        Intake/Output Summary (Last 24 hours) at 11/16/2020 0755 Last data filed at 11/16/2020 0402 Gross per 24 hour  Intake --  Output 1260 ml  Net -1260 ml   Filed Weights   10/28/20 0511 11/06/20 0404 11/13/20 0329  Weight: 67.4 kg 67 kg 67.4 kg   Examination: Physical Exam:  Constitutional: WN/WD thin Caucasian male currently in no acute distress appears a little sleepy but appears calm and comfortable  Eyes: Lids and conjunctivae normal, sclerae anicteric  ENMT: External Ears, Nose appear normal. Grossly normal hearing. .  Neck: Appears normal, supple, no cervical masses, normal  ROM, no appreciable thyromegaly; no appreciable JVD Respiratory: Diminished to auscultation bilaterally with coarse breath sounds, no wheezing, rales, rhonchi or crackles. Normal respiratory effort and patient is not tachypenic. No accessory muscle use.  Has a right-sided chest tube connected to waterseal. Cardiovascular: RRR, no murmurs / rubs / gallops. S1 and S2 auscultated. No extremity edema Abdomen: Soft, non-tender, non-distended. Bowel sounds positive.  GU: Deferred. Musculoskeletal: No clubbing / cyanosis of digits/nails. No joint deformity upper and lower extremities. Skin: No rashes, lesions, ulcers on limited skin evaluation. No induration; Warm and dry.  Neurologic: CN 2-12 grossly intact with no focal deficits. Romberg sign and cerebellar reflexes not assessed.  Psychiatric: Normal judgment and insight. Alert and oriented x 3. Normal mood and appropriate affect.    Data Reviewed: I have personally reviewed following labs and imaging studies  CBC: Recent Labs  Lab 11/10/20 0155 11/11/20 0048 11/12/20 0336 11/15/20 0808  WBC 7.4 6.3 7.1 9.0  NEUTROABS 4.2 3.1 4.2 5.8  HGB 11.3* 11.2* 11.3* 11.9*  HCT 34.1* 34.0* 34.9* 35.5*  MCV 92.4 91.4 92.3 91.0  PLT 287 312 305 431   Basic Metabolic Panel: Recent Labs  Lab 11/10/20 0155 11/11/20 0048 11/12/20 0336 11/15/20 0808  NA 136 136 136 137  K 3.9 3.7 3.6 4.1  CL 103 105 107 107  CO2 _0 GLUCOSE 104* 108* 137* 98  BUN _1 CREATININE 0.89 0.88 0.98 0.93  CALCIUM 8.9 8.8* 8.8* 9.0  MG 1.9 2.0 2.0 2.0  PHOS  --   --   --  4.0   GFR: Estimated Creatinine Clearance: 97.6 mL/min (by C-G formula based on SCr of 0.93 mg/dL). Liver Function Tests: Recent Labs  Lab 11/10/20 0155 11/11/20 0048 11/12/20 0336 11/15/20 0808  AST 10* 8* 10* 11*  ALT _2 ALKPHOS 75 74 84 69  BILITOT 0.4 0.4 0.5 0.2*  PROT 6.1* 6.2* 6.2* 6.4*  ALBUMIN 3.1* 3.0* 3.1* 3.2*   Recent Labs  Lab 11/10/20 0155 11/11/20 0048 11/12/20 0336  LIPASE _3 No results for input(s): AMMONIA in the last 168 hours. Coagulation Profile: No results for input(s): INR, PROTIME in the last 168 hours. Cardiac Enzymes: No results for input(s): CKTOTAL, CKMB, CKMBINDEX, TROPONINI in the last 168 hours. BNP (last 3 results) No results for input(s): PROBNP in the last 8760 hours. HbA1C: No results for input(s): HGBA1C in the last 72 hours. CBG: No results for input(s): GLUCAP in the last 168 hours. Lipid Profile: No results for input(s): CHOL, HDL, LDLCALC, TRIG, CHOLHDL, LDLDIRECT in the last 72 hours. Thyroid Function Tests: No results for input(s): TSH, T4TOTAL, FREET4, T3FREE, THYROIDAB in the last 72 hours. Anemia Panel: Recent Labs    11/15/20 0808  VITAMINB12 356  FOLATE 4.5*  FERRITIN 133  TIBC 469*  IRON 66  RETICCTPCT 1.9   Sepsis Labs: Recent Labs  Lab 11/10/20 0155  11/11/20 0048 11/12/20 0336  PROCALCITON <0.10 <0.10 <0.10    No results found for this or any previous visit (from the past 240 hour(s)).   RN Pressure Injury Documentation:     Estimated body mass index is 20.72 kg/m as calculated from the following:   Height as of 06/19/20: _4  (1.803 m).   Weight as of this encounter: 67.4 kg.  Malnutrition Type:   Malnutrition Characteristics:    Nutrition Interventions:   Radiology Studies: DG CHEST PORT 1 VIEW  Result Date:  11/16/2020 CLINICAL DATA:  Right-sided chest tube. EXAM: PORTABLE CHEST 1 VIEW COMPARISON:  11/15/2020. FINDINGS: Right chest tube in stable position. Persistent right base pneumothorax. Postsurgical changes right lung. No focal infiltrate. No pleural effusion. No acute bony abnormality. Prior cervical spine fusion. Surgical clips upper abdomen. IMPRESSION: Right chest tube in stable position. Persistent right base pneumothorax. Electronically Signed   By: Marcello Moores  Register   On: 11/16/2020 05:55   DG CHEST PORT 1 VIEW  Result Date: 11/15/2020 CLINICAL DATA:  Chest tube on the right EXAM: PORTABLE CHEST 1 VIEW COMPARISON:  Nov 14, 2020 FINDINGS: Chest tube position on the right stable. Minimal right apical pneumothorax. Postoperative change noted on the right. No edema or airspace opacity. Heart size and pulmonary vascularity normal. No adenopathy. No bone lesions. IMPRESSION: Chest tube on the right with trace apical pneumothorax on the right. No edema or airspace opacity. Heart size normal. Electronically Signed   By: Lowella Grip III M.D.   On: 11/15/2020 08:28   Scheduled Meds: . acetaminophen  1,000 mg Oral Q8H  . apixaban  2.5 mg Oral BID  . bisacodyl  10 mg Oral Daily  . divalproex  500 mg Oral Daily  . divalproex  750 mg Oral QHS  . feeding supplement  237 mL Oral BID BM  . lacosamide  100 mg Oral BID  . polyethylene glycol  17 g Oral Daily  . senna-docusate  1 tablet Oral QHS  . zonisamide  300 mg  Oral QHS   Continuous Infusions:   LOS: 19 days   Kerney Elbe, DO [facility] PAGER is on AMION  If 7PM-7AM, please contact night-coverage [email or web address]

## 2020-11-17 ENCOUNTER — Inpatient Hospital Stay (HOSPITAL_COMMUNITY): Payer: Self-pay

## 2020-11-17 LAB — CBC WITH DIFFERENTIAL/PLATELET
Abs Immature Granulocytes: 0.03 10*3/uL (ref 0.00–0.07)
Basophils Absolute: 0.1 10*3/uL (ref 0.0–0.1)
Basophils Relative: 1 %
Eosinophils Absolute: 0.2 10*3/uL (ref 0.0–0.5)
Eosinophils Relative: 3 %
HCT: 33.4 % — ABNORMAL LOW (ref 39.0–52.0)
Hemoglobin: 11 g/dL — ABNORMAL LOW (ref 13.0–17.0)
Immature Granulocytes: 0 %
Lymphocytes Relative: 28 %
Lymphs Abs: 2.3 10*3/uL (ref 0.7–4.0)
MCH: 30.1 pg (ref 26.0–34.0)
MCHC: 32.9 g/dL (ref 30.0–36.0)
MCV: 91.3 fL (ref 80.0–100.0)
Monocytes Absolute: 0.7 10*3/uL (ref 0.1–1.0)
Monocytes Relative: 9 %
Neutro Abs: 4.6 10*3/uL (ref 1.7–7.7)
Neutrophils Relative %: 59 %
Platelets: 273 10*3/uL (ref 150–400)
RBC: 3.66 MIL/uL — ABNORMAL LOW (ref 4.22–5.81)
RDW: 12.7 % (ref 11.5–15.5)
WBC: 7.9 10*3/uL (ref 4.0–10.5)
nRBC: 0 % (ref 0.0–0.2)

## 2020-11-17 LAB — MAGNESIUM: Magnesium: 1.9 mg/dL (ref 1.7–2.4)

## 2020-11-17 LAB — COMPREHENSIVE METABOLIC PANEL
ALT: 9 U/L (ref 0–44)
AST: 9 U/L — ABNORMAL LOW (ref 15–41)
Albumin: 3.1 g/dL — ABNORMAL LOW (ref 3.5–5.0)
Alkaline Phosphatase: 74 U/L (ref 38–126)
Anion gap: 5 (ref 5–15)
BUN: 18 mg/dL (ref 6–20)
CO2: 26 mmol/L (ref 22–32)
Calcium: 8.8 mg/dL — ABNORMAL LOW (ref 8.9–10.3)
Chloride: 105 mmol/L (ref 98–111)
Creatinine, Ser: 0.94 mg/dL (ref 0.61–1.24)
GFR, Estimated: >60 mL/min (ref >60)
Glucose, Bld: 105 mg/dL — ABNORMAL HIGH (ref 70–99)
Potassium: 3.5 mmol/L (ref 3.5–5.1)
Sodium: 136 mmol/L (ref 135–145)
Total Bilirubin: 0.3 mg/dL (ref 0.3–1.2)
Total Protein: 6.1 g/dL — ABNORMAL LOW (ref 6.5–8.1)

## 2020-11-17 LAB — PHOSPHORUS: Phosphorus: 5.1 mg/dL — ABNORMAL HIGH (ref 2.5–4.6)

## 2020-11-17 NOTE — Progress Notes (Signed)
15:23pm The right basilar pneumothorax has expanded with clamp trial today.  Will place the chest tube back to water seal.   Gaynelle Arabian, PA-C [phone]

## 2020-11-17 NOTE — TOC Progression Note (Signed)
Transition of Care [facility]) - Progression Note    Patient Details  Name: Ronald Butler MRN: [id number] Date of Birth: Jun 23, 1976  [facility] CM/SW Contact  Lockie Pares, RN Phone Number: 11/17/2020, 7:59 AM  Clinical Narrative:     Patient on water seal with slight air leak. Continues to have a mild pneumothorax is refusing PT regularly due to different reasons. Will be incarcerated upon discharge.   Expected Discharge Plan: [facility]rections Facility Barriers to Discharge: Continued Medical Work up  Expected Discharge Plan and Services Expected Discharge Plan: [employer]                                               Social Determinants of Health (SDOH) Interventions    Readmission Risk Interventions No flowsheet data found.

## 2020-11-17 NOTE — Progress Notes (Signed)
Physical Therapy Treatment Patient Details Name: Ronald Butler MRN: [id number] DOB: Dec 31, 1975 Today's Date: 11/17/2020    History of Present Illness 45 yo male adm 5/00 from police custody with SOB and chest pain. 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  Found incidental Covid. On 5/2 pt had wedge resection of rt upper and lower lobes, apical pleurectomy, and mechanical pleurodesis. Chest tube in place.  PMHx: pancreatitis, recurrent pneumothorax, seizures, cholecystectomy, smoker, THC    PT Comments    Patient received in bed, pleasant but continues to be pain limited; RN OK with PT working with him during chest tube clamping trial. Still able to mobilize on an independent to min guard basis, but very antalgic and pain limited due to pain from chest tube site. Tolerated standing today with RW for about 30 seconds with trunk flexed due to pain, unable to attempt gait today due to increasing pain levels at chest tube site. Left in bed with all needs met, RN aware of patient status. Will continue to follow.     Follow Up Recommendations  Supervision - Intermittent     Equipment Recommendations  Rolling walker with 5" wheels    Recommendations for Other Services       Precautions / Restrictions Precautions Precautions: Fall;Other (comment) Precaution Comments: chest tube (has been going back and forth between water seal and suction) Restrictions Weight Bearing Restrictions: No    Mobility  Bed Mobility Overal bed mobility: Needs Assistance Bed Mobility: Supine to Sit;Sit to Supine     Supine to sit: Independent;HOB elevated Sit to supine: Independent;HOB elevated   General bed mobility comments: HOB only mildly elevated, assist for line management    Transfers Overall transfer level: Needs assistance Equipment used: Rolling walker (2 wheeled) Transfers: Sit to/from Stand Sit to Stand: Min guard         General transfer comment: increased time/effort  to boost to full upright standing position- very antalgic and pain limited  Ambulation/Gait             General Gait Details: unable, pain   Stairs             Wheelchair Mobility    Modified Rankin (Stroke Patients Only)       Balance Overall balance assessment: Mild deficits observed, not formally tested Sitting-balance support: Feet supported Sitting balance-Leahy Scale: Normal     Standing balance support: Bilateral upper extremity supported;During functional activity Standing balance-Leahy Scale: Fair Standing balance comment: reliant on BUE support                            Cognition Arousal/Alertness: Awake/alert Behavior During Therapy: Anxious Overall Cognitive Status: Within Functional Limits for tasks assessed                                 General Comments: very anxious overall, perseverating on chest tube and related pain, but cooperative      Exercises      General Comments        Pertinent Vitals/Pain Pain Assessment: Faces Faces Pain Scale: Hurts whole lot Pain Location: chest tube site and incision Pain Descriptors / Indicators: Operative site guarding;Grimacing Pain Intervention(s): Limited activity within patient's tolerance;Monitored during session    Home Living                      Prior  Function            PT Goals (current goals can now be found in the care plan section) Acute Rehab PT Goals Patient Stated Goal: to get pain managed PT Goal Formulation: With patient Time For Goal Achievement: 11/23/20 Potential to Achieve Goals: Good Progress towards PT goals: Not progressing toward goals - comment (pain limited)    Frequency    Min 3X/week      PT Plan Current plan remains appropriate    Co-evaluation              AM-PAC PT "6 Clicks" Mobility   Outcome Measure  Help needed turning from your back to your side while in a flat bed without using bedrails?:  None Help needed moving from lying on your back to sitting on the side of a flat bed without using bedrails?: None Help needed moving to and from a bed to a chair (including a wheelchair)?: A Little Help needed standing up from a chair using your arms (e.g., wheelchair or bedside chair)?: A Little Help needed to walk in hospital room?: A Little Help needed climbing 3-5 steps with a railing? : A Little 6 Click Score: 20    End of Session   Activity Tolerance: Patient limited by pain Patient left: in bed;with call bell/phone within reach;Other (comment) (Engineer, structural outside of room) Nurse Communication: Mobility status PT Visit Diagnosis: Difficulty in walking, not elsewhere classified (R26.2);Pain Pain - Right/Left: Right Pain - part of body:  (chest wall)     Time: 7900-9200 PT Time Calculation (min) (ACUTE ONLY): 13 min  Charges:  $Therapeutic Activity: 8-22 mins                     Windell Norfolk, DPT, PN1   Supplemental Physical Therapist [place]    Pager [phone] Acute Rehab Office [phone]

## 2020-11-17 NOTE — Progress Notes (Signed)
PROGRESS NOTE    Ronald Butler  VFI:[id number] DOB: 07-Mar-1976 DOA: 10/28/2020 PCP: Patient, No Pcp Per (Inactive)   Brief Narrative: The patient is a 45 year old Caucasian male with a past medical history significant for but not limited to recurrent spontaneous pneumothorax previously refused VATS, polysubstance abuse, seizure disorder as well as other comorbidities who was apprehended by law enforcement and incarcerated and subsequently developed chest pain and shortness of breath and was found to have a right sided pneumothorax.  A chest tube was placed and he was transferred to [facility] surgery evaluation from [facility].  He subsequently underwent VATS with wedge resection on 10/30/2020. He was also found to be positive for COVID-19 incidentally.  On 11/02/2020 his chest tube was clamped and pneumothorax reoccurred with lung collapse.  Continues to have his chest tube to waterseal and an air leak so cardiothoracic surgery recommending continuing chest tube today.  Repeat CXR on 11/16/20 shows "Right chest tube in stable position. Persistent right base pneumothorax." Patient underwent a CT Chest to check for any further bullous disease and it showed "Small residual right basilar pneumothorax also with incomplete re-expansion of the superior segment of the right lower lobe despite well-positioned thoracostomy tube. Evidence of prior surgical resection of pulmonary blebs at the right lung apex and superior segment of the right lower lobe. Three small blebs present along the anterior aspect of the right lung apex. Mild centrilobular pulmonary emphysema."  Assessment & Plan:   Active Problems:   Pneumothorax  Right Sided Pneumothorax s/p Wedge Resection on 10/30/20 -CT Surgery following and appreciate care and further management from them -The chest tube is to waterseal and there is a 1+ to 2 air leak with cough and the CXR this AM showed "Chest tube unchanged in position  on the right with persistent right basilar pneumothorax. No tension component. Scarring right apex better seen on CT. No edema or airspace opacity. Heart size normal"; CT Surgery attempting Clamp Trial today and it was clamped at 09:15 and they are going to repeat CXR in 4 hours -Pnuemothorax improved but CT Surgery states to keep today given his airleak with cough and trace right apical pneumothorax -Cardiothoracic surgery recommending checking a CT of the chest today and it was done and showed "Small residual right basilar pneumothorax also with incomplete re-expansion of the superior segment of the right lower lobe despite well-positioned thoracostomy tube. Evidence of prior surgical resection of pulmonary blebs at the right lung apex and superior segment of the right lower lobe. Three small blebs present along the anterior aspect of the right lung apex. Mild centrilobular pulmonary emphysema." -Will need to repeat chest x-[place] in a.m. and recommending continue incentive spirometer -PT/OT recommending Supervision and Rolling Walker with 5" Wheels  -He appears comfortable on examination and will Continue with pain control and patient has oxycodone IR 10 mg every 6 as needed for moderate pain and hydromorphone 1 mg IV every 4 as needed severe pain as well as p.o. acetaminophen 1000 mg every 8 hours scheduled -He has been suspected to have Narcotic Seeking behavior -Continue with bowel regimen with bisacodyl 10 mg p.o. daily and with MiraLAX 17 g p.o. daily in addition to senna docusate 1 tab p.o. nightly -Further Care per TCTS  COVID-19 -Incidental finding and Unclear how long he has had this -Observing for now -Off of Isolation -Tested Positive 10/28/20 -Last 3 PCT's have been Negative at <0.10  -D-Dimer trended up and went from 0.85 ->  3.15 -ESR went from 3.0 -> 1.6 -> 4.6 Recent Labs    11/15/20 0808  FERRITIN 133    Lab Results  Component Value Date   SARSCOV2NAA POSITIVE (A) 10/28/2020    [place] NEGATIVE 04/26/2020   [place] NEGATIVE 04/18/2020  -SpO2: 99 % O2 Flow Rate (L/min): 2 L/min -Wean O2  -Refused sq Lovenox so was switched to Apixaban 2.5 mg po BID   Seizure Disorder -C/w Antiepileptics with divalproex thousand once p.o. daily as well as 750 mg p.o. nightly and with Lacosamide 100 g p.o. twice daily and with Zonisamide 300 mg p.o. nightly  Tobacco Abuse -Smoking cessation counseling given  -Reefusing a nicotine patch  Polystubstance Use -UDS was Positive for Cocaine and Opiates on Admission  -Counseling was given   Hypotension -BP remain softer -Last BP was 100/73 -Continue to Monitor and Trend -Repeat BP per Protocol   Normocytic Anemia  -Stable. Patient's Last Hgb/Hct was 11.9/35.5 and today it is 11.0/33.4 -Checked Anemia Panel as he is anticoagulated with apixaban 2.5 mils p.o. twice daily and it showed an iron level of 66, U IBC of 403, TIBC 469, saturation ratios of 14%, ferritin of 133 and folate of 4.5 as well as vitamin B12 level 356 -Continue to Monitor for S/Sx of Bleeding; No overt bleeding noted -Repeat CBC intermittently   Hyperglycemia -Mild and Reactively likely -Blood Sugars have been ranging from 98-137 on Daily CMP's -Continue to Monitor and Trend intermittently -Last HbA1c was 5.5 in October 2021  DVT prophylaxis: Anticoagulated with Eliquis 2.5 mg p.o. twice daily Code Status: FULL CODE  Family Communication: No family present at bedside  Disposition Plan: Pending further Clinical Improvement and Clearance by CT Surgery and removal of Chest Tube  Status is: Inpatient  Remains inpatient appropriate because:Unsafe d/c plan, IV treatments appropriate due to intensity of illness or inability to take PO and Inpatient level of care appropriate due to severity of illness   Dispo: The patient is from: [place]              Anticipated d/c is to: [place]              Patient currently is not medically stable to d/c.    Difficult to place patient No  Consultants:   Cardiothoracic Surgery    Procedures: VATS with Wedge Resection  Antimicrobials:  Anti-infectives (From admission, onward)   Start     Dose/Rate Route Frequency Ordered Stop   10/30/20 2130  ceFAZolin (ANCEF) IVPB 2g/100 mL premix        2 g 200 mL/hr over 30 Minutes Intravenous Every 8 hours 10/30/20 1641 10/31/20 0719        Subjective: Seen and examined at bedside and states that he was not feeling as well and was still little short of breath and having some pain.  Had some mild nausea.  Denies any lightheadedness or dizziness.  No other concerns or complaints at this time.  Tolerated standing yesterday with the rolling walker for about 30 seconds and then trunk flexed due to pain and unable to ambulate due to pain at the chest tube site.  Objective: Vitals:   11/17/20 0355 11/17/20 0747 11/17/20 1149 11/17/20 1210  BP: 96/70 91/77 (!) 87/63 100/73  Pulse: 72 77 79   Resp: 19 16 16    Temp: 98.3 F (36.8 C) 97.7 F (36.5 C) 98.6 F (37 C)   TempSrc: Oral Oral Oral   SpO2:  96% 99%   Weight:  Intake/Output Summary (Last 24 hours) at 11/17/2020 1242 Last data filed at 11/17/2020 7673 Gross per 24 hour  Intake 0 ml  Output 1300 ml  Net -1300 ml   Filed Weights   10/28/20 0511 11/06/20 0404 11/13/20 0329  Weight: 67.4 kg 67 kg 67.4 kg   Examination: Physical Exam:  Constitutional: WN/WD thin Caucasian male currently appears in mild distress and slightly uncomfortable due to pain Eyes: Lids and conjunctivae normal, sclerae anicteric  ENMT: External Ears, Nose appear normal. Grossly normal hearing. Neck: Appears normal, supple, no cervical masses, normal ROM, no appreciable thyromegaly; no JVD Respiratory: Diminished to auscultation bilaterally with coarse breath sounds, no wheezing, rales, rhonchi or crackles. Normal respiratory effort and patient is not tachypenic. No accessory muscle use. Right sided Chest tub in  place Cardiovascular: RRR, no murmurs / rubs / gallops. S1 and S2 auscultated. No extremity edema. Abdomen: Soft, non-tender, non-distended. Bowel sounds positive.  GU: Deferred. Musculoskeletal: No clubbing / cyanosis of digits/nails. No joint deformity upper and lower extremities. Skin: No rashes, lesions, ulcers on a limited skin evaluation. No induration; Warm and dry.  Neurologic: CN 2-12 grossly intact with no focal deficits. Romberg sign and cerebellar reflexes not assessed.  Psychiatric: Normal judgment and insight. Alert and oriented x 3. Normal mood and appropriate affect.   Data Reviewed: I have personally reviewed following labs and imaging studies  CBC: Recent Labs  Lab 11/11/20 0048 11/12/20 0336 11/15/20 0808 11/17/20 0127  WBC 6.3 7.1 9.0 7.9  NEUTROABS 3.1 4.2 5.8 4.6  HGB 11.2* 11.3* 11.9* 11.0*  HCT 34.0* 34.9* 35.5* 33.4*  MCV 91.4 92.3 91.0 91.3  PLT 312 305 291 419   Basic Metabolic Panel: Recent Labs  Lab 11/11/20 0048 11/12/20 0336 11/15/20 0808 11/17/20 0127  NA 136 136 137 136  K 3.7 3.6 4.1 3.5  CL 105 107 107 105  CO2 26 24 24 26   GLUCOSE 108* 137* 98 105*  BUN 17 20 18 18   CREATININE 0.88 0.98 0.93 0.94  CALCIUM 8.8* 8.8* 9.0 8.8*  MG 2.0 2.0 2.0 1.9  PHOS  --   --  4.0 5.1*   GFR: Estimated Creatinine Clearance: 96.6 mL/min (by C-G formula based on SCr of 0.94 mg/dL). Liver Function Tests: Recent Labs  Lab 11/11/20 0048 11/12/20 0336 11/15/20 0808 11/17/20 0127  AST 8* 10* 11* 9*  ALT 8 7 13 9   ALKPHOS 74 84 69 74  BILITOT 0.4 0.5 0.2* 0.3  PROT 6.2* 6.2* 6.4* 6.1*  ALBUMIN 3.0* 3.1* 3.2* 3.1*   Recent Labs  Lab 11/11/20 0048 11/12/20 0336  LIPASE 27 27   No results for input(s): AMMONIA in the last 168 hours. Coagulation Profile: No results for input(s): INR, PROTIME in the last 168 hours. Cardiac Enzymes: No results for input(s): CKTOTAL, CKMB, CKMBINDEX, TROPONINI in the last 168 hours. BNP (last 3 results) No  results for input(s): PROBNP in the last 8760 hours. HbA1C: No results for input(s): HGBA1C in the last 72 hours. CBG: No results for input(s): GLUCAP in the last 168 hours. Lipid Profile: No results for input(s): CHOL, HDL, LDLCALC, TRIG, CHOLHDL, LDLDIRECT in the last 72 hours. Thyroid Function Tests: No results for input(s): TSH, T4TOTAL, FREET4, T3FREE, THYROIDAB in the last 72 hours. Anemia Panel: Recent Labs    11/15/20 0808  VITAMINB12 356  FOLATE 4.5*  FERRITIN 133  TIBC 469*  IRON 66  RETICCTPCT 1.9   Sepsis Labs: Recent Labs  Lab 11/11/20 0048 11/12/20 0336  PROCALCITON <0.10 <0.10    No results found for this or any previous visit (from the past 240 hour(s)).   RN Pressure Injury Documentation:     Estimated body mass index is 20.72 kg/m as calculated from the following:   Height as of 06/19/20: 5' 11"  (1.803 m).   Weight as of this encounter: 67.4 kg.  Malnutrition Type:   Malnutrition Characteristics:    Nutrition Interventions:   Radiology Studies: CT chest without contrast  Result Date: 11/16/2020 CLINICAL DATA:  Pneumothorax with persistent air leak. Evaluate for blebs. EXAM: CT CHEST WITHOUT CONTRAST TECHNIQUE: Multidetector CT imaging of the chest was performed following the standard protocol without IV contrast. COMPARISON:  None. FINDINGS: Cardiovascular: Limited evaluation in the absence of intravenous contrast. No evidence of aneurysm. Heart is normal in size. No pericardial effusion. Mediastinum/Nodes: Unremarkable CT appearance of the thyroid gland. No suspicious mediastinal or hilar adenopathy. No soft tissue mediastinal mass. The thoracic esophagus is unremarkable. Lungs/Pleura: Surgical changes of prior blebectomy at the right lung apex and superior segment of the right lower lobe. There are a few small blebs along the anterior margin of the right lung apex. Centrilobular pulmonary emphysema is also present. Small right basilar pneumothorax.  Incomplete re-expansion of the superior segment of the right lower lobe. Thoracostomy tube in place entering via the right 9-10 rib interspace. Upper Abdomen: No acute abnormality. Incompletely imaged pneumobilia presumably related to prior sphincterotomy. Musculoskeletal: No acute fracture or aggressive appearing lytic or blastic osseous lesion. IMPRESSION: 1. Small residual right basilar pneumothorax also with incomplete re-expansion of the superior segment of the right lower lobe despite well-positioned thoracostomy tube. 2. Evidence of prior surgical resection of pulmonary blebs at the right lung apex and superior segment of the right lower lobe. 3. Three small blebs present along the anterior aspect of the right lung apex. 4. Mild centrilobular pulmonary emphysema. Emphysema (ICD10-J43.9). Electronically Signed   By: Jacqulynn Cadet M.D.   On: 11/16/2020 08:13   DG CHEST PORT 1 VIEW  Result Date: 11/17/2020 CLINICAL DATA:  Chest pain.  Chest tube in place EXAM: PORTABLE CHEST 1 VIEW COMPARISON:  Chest radiograph and chest CT Nov 16, 2020 FINDINGS: Chest tube remains on the right. Right basilar pneumothorax again noted, better seen by CT. The focal area of presumed scarring in the right apex is less well seen by radiography than CT. No edema or airspace opacity. Heart size and pulmonary vascularity normal. No adenopathy. Postoperative change lower cervical spine. IMPRESSION: Chest tube unchanged in position on the right with persistent right basilar pneumothorax. No tension component. Scarring right apex better seen on CT. No edema or airspace opacity. Heart size normal Electronically Signed   By: Lowella Grip III M.D.   On: 11/17/2020 08:20   DG CHEST PORT 1 VIEW  Result Date: 11/16/2020 CLINICAL DATA:  Right-sided chest tube. EXAM: PORTABLE CHEST 1 VIEW COMPARISON:  11/15/2020. FINDINGS: Right chest tube in stable position. Persistent right base pneumothorax. Postsurgical changes right lung. No  focal infiltrate. No pleural effusion. No acute bony abnormality. Prior cervical spine fusion. Surgical clips upper abdomen. IMPRESSION: Right chest tube in stable position. Persistent right base pneumothorax. Electronically Signed   By: Marcello Moores  Register   On: 11/16/2020 05:55   Scheduled Meds: . acetaminophen  1,000 mg Oral Q8H  . apixaban  2.5 mg Oral BID  . bisacodyl  10 mg Oral Daily  . divalproex  500 mg Oral Daily  . divalproex  750 mg Oral QHS  .  feeding supplement  237 mL Oral BID BM  . lacosamide  100 mg Oral BID  . polyethylene glycol  17 g Oral Daily  . senna-docusate  1 tablet Oral QHS  . zonisamide  300 mg Oral QHS   Continuous Infusions:   LOS: 20 days   Kerney Elbe, DO [facility] PAGER is on [place]  If 7PM-7AM, please contact night-coverage [email or web address]

## 2020-11-17 NOTE — Progress Notes (Signed)
      [address]       [place] [address]             [phone]       18 Days Post-Op Procedure(s) (LRB): XI ROBOTIC ASSISTED THORASCOPY-WEDGE RESECTION,UPPER LOBE AND LOWER LOBE (Right) INTERCOSTAL NERVE BLOCK (Right) PLEURECTOMY (Right)  Subjective:  No changes in his breathing, pain is about the same and continues to be associated with the tube.   Objective: Vital signs in last 24 hours: Temp:  [97.7 F (36.5 C)-99.1 F (37.3 C)] 97.7 F (36.5 C) (05/20 0747) Pulse Rate:  [69-86] 77 (05/20 0747) Cardiac Rhythm: Normal sinus rhythm (05/20 0752) Resp:  [13-19] 16 (05/20 0747) BP: (91-101)/(68-77) 91/77 (05/20 0747) SpO2:  [96 %-100 %] 96 % (05/20 0747)     Intake/Output from previous day: 05/19 0701 - 05/20 0700 In: 120 [P.O.:120] Out: 900 [Urine:900]   Physical Exam:  Cardiovascular: RRR Pulmonary: Clear to auscultation bilaterally Wounds: Clean and dry.  No erythema or signs of infection. Chest Tube: to water seal, +1 air leak only with cough.  Connections are secure and no leaks detected at insertion site.   Lab Results: CBC: Recent Labs    11/15/20 0808 11/17/20 0127  WBC 9.0 7.9  HGB 11.9* 11.0*  HCT 35.5* 33.4*  PLT 291 273   BMET:  Recent Labs    11/15/20 0808 11/17/20 0127  NA 137 136  K 4.1 3.5  CL 107 105  CO2 24 26  GLUCOSE 98 105*  BUN 18 18  CREATININE 0.93 0.94  CALCIUM 9.0 8.8*    EXAM: PORTABLE CHEST 1 VIEW  COMPARISON:  Chest radiograph and chest CT Nov 16, 2020  FINDINGS: Chest tube remains on the right. Right basilar pneumothorax again noted, better seen by CT. The focal area of presumed scarring in the right apex is less well seen by radiography than CT. No edema or airspace opacity. Heart size and pulmonary vascularity normal. No adenopathy. Postoperative change lower cervical spine.  IMPRESSION: Chest tube unchanged in position on the right with persistent right basilar pneumothorax. No  tension component. Scarring right apex better seen on CT. No edema or airspace opacity. Heart size normal   Electronically Signed   By: Bretta Bang III M.D.   On: 11/17/2020 08:20 Assessment/Plan:  1. CV - SR 2.  Pulmonary - Chest tube with minimal output. Chest tube remains to water seal and the small 1+ air leak was only present with cough. CXR this am again shows resolution of the right apical pneumothorax and a small, stable basilar PTX.  Will attempt clamp trial today. CT clamped at 09:15, will repeat CXR in about 4 hours.   Cecille Amsterdam RoddenberryPA-C 11/17/2020,9:31 AM [phone]

## 2020-11-18 ENCOUNTER — Inpatient Hospital Stay (HOSPITAL_COMMUNITY): Payer: Self-pay

## 2020-11-18 LAB — COMPREHENSIVE METABOLIC PANEL
ALT: 11 U/L (ref 0–44)
AST: 9 U/L — ABNORMAL LOW (ref 15–41)
Albumin: 3.2 g/dL — ABNORMAL LOW (ref 3.5–5.0)
Alkaline Phosphatase: 73 U/L (ref 38–126)
Anion gap: 8 (ref 5–15)
BUN: 15 mg/dL (ref 6–20)
CO2: 23 mmol/L (ref 22–32)
Calcium: 9 mg/dL (ref 8.9–10.3)
Chloride: 106 mmol/L (ref 98–111)
Creatinine, Ser: 0.93 mg/dL (ref 0.61–1.24)
GFR, Estimated: >60 mL/min (ref >60)
Glucose, Bld: 93 mg/dL (ref 70–99)
Potassium: 3.9 mmol/L (ref 3.5–5.1)
Sodium: 137 mmol/L (ref 135–145)
Total Bilirubin: 0.5 mg/dL (ref 0.3–1.2)
Total Protein: 6.2 g/dL — ABNORMAL LOW (ref 6.5–8.1)

## 2020-11-18 LAB — FIBRINOGEN: Fibrinogen: 384 mg/dL (ref 210–475)

## 2020-11-18 LAB — SEDIMENTATION RATE: Sed Rate: 37 mm/hr — ABNORMAL HIGH (ref 0–16)

## 2020-11-18 LAB — PHOSPHORUS: Phosphorus: 4.1 mg/dL (ref 2.5–4.6)

## 2020-11-18 LAB — CBC WITH DIFFERENTIAL/PLATELET
Abs Immature Granulocytes: 0.03 10*3/uL (ref 0.00–0.07)
Basophils Absolute: 0 10*3/uL (ref 0.0–0.1)
Basophils Relative: 1 %
Eosinophils Absolute: 0.2 10*3/uL (ref 0.0–0.5)
Eosinophils Relative: 2 %
HCT: 33.8 % — ABNORMAL LOW (ref 39.0–52.0)
Hemoglobin: 11.2 g/dL — ABNORMAL LOW (ref 13.0–17.0)
Immature Granulocytes: 0 %
Lymphocytes Relative: 27 %
Lymphs Abs: 1.9 10*3/uL (ref 0.7–4.0)
MCH: 30 pg (ref 26.0–34.0)
MCHC: 33.1 g/dL (ref 30.0–36.0)
MCV: 90.6 fL (ref 80.0–100.0)
Monocytes Absolute: 0.7 10*3/uL (ref 0.1–1.0)
Monocytes Relative: 10 %
Neutro Abs: 4.2 10*3/uL (ref 1.7–7.7)
Neutrophils Relative %: 60 %
Platelets: 248 10*3/uL (ref 150–400)
RBC: 3.73 MIL/uL — ABNORMAL LOW (ref 4.22–5.81)
RDW: 13 % (ref 11.5–15.5)
WBC: 6.9 10*3/uL (ref 4.0–10.5)
nRBC: 0 % (ref 0.0–0.2)

## 2020-11-18 LAB — MAGNESIUM: Magnesium: 2 mg/dL (ref 1.7–2.4)

## 2020-11-18 LAB — LACTATE DEHYDROGENASE: LDH: 99 U/L (ref 98–192)

## 2020-11-18 LAB — D-DIMER, QUANTITATIVE: D-Dimer, Quant: 0.55 ug/mL-FEU — ABNORMAL HIGH (ref 0.00–0.50)

## 2020-11-18 LAB — FERRITIN: Ferritin: 113 ng/mL (ref 24–336)

## 2020-11-18 LAB — C-REACTIVE PROTEIN: CRP: 0.5 mg/dL (ref <1.0)

## 2020-11-18 NOTE — Progress Notes (Signed)
      [address]       [place] [address]             [phone]       19 Days Post-Op Procedure(s) (LRB): XI ROBOTIC ASSISTED THORASCOPY-WEDGE RESECTION,UPPER LOBE AND LOWER LOBE (Right) INTERCOSTAL NERVE BLOCK (Right) PLEURECTOMY (Right)  Subjective:  Sleeping in bed.  No changes in his breathing, now having some pain up near his right clavicle.   Objective: Vital signs in last 24 hours: Temp:  [98.3 F (36.8 C)-98.6 F (37 C)] 98.6 F (37 C) (05/21 0348) Pulse Rate:  [67-99] 67 (05/21 0800) Cardiac Rhythm: Normal sinus rhythm;Bundle branch block (05/21 0700) Resp:  [13-21] 16 (05/21 0800) BP: (87-103)/(63-74) 96/74 (05/21 0800) SpO2:  [98 %-100 %] 98 % (05/21 0800)     Intake/Output from previous day: 05/20 0701 - 05/21 0700 In: -  Out: 400 [Urine:400]   Physical Exam:  Cardiovascular: RRR Pulmonary: Clear to auscultation bilaterally Wounds: Clean and dry.  No erythema or signs of infection. Chest Tube: to water seal, +1-2 air leak today.  Connections are secure and no leaks detected at insertion site.   Lab Results: CBC: Recent Labs    11/17/20 0127 11/18/20 0002  WBC 7.9 6.9  HGB 11.0* 11.2*  HCT 33.4* 33.8*  PLT 273 248   BMET:  Recent Labs    11/17/20 0127 11/18/20 0002  NA 136 137  K 3.5 3.9  CL 105 106  CO2 26 23  GLUCOSE 105* 93  BUN 18 15  CREATININE 0.94 0.93  CALCIUM 8.8* 9.0    EXAM: PORTABLE CHEST 1 VIEW  COMPARISON:  Nov 17, 2020  FINDINGS: The basilar right pneumothorax is improved. There is now a small right lateral component measuring 6 mm, not seen on the previous study. The right chest tube remains in place. No left-sided pneumothorax. The cardiomediastinal silhouette is stable. No other acute abnormalities.  IMPRESSION: The right chest tube remains in good position. Overall, the right-sided pneumothorax is smaller in the interval. Only a small lateral component measuring 6 mm is  identified.   Electronically Signed   By: Gerome Sam III M.D   On: 11/18/2020 09:17   Assessment/Plan:  -POD19 right robotic-assisted wedge resections of RUL and RLL for recurrent spontaneous pneumothorax.  Chest tube with minimal output.  Attempted clamp trial on 5/20 resulting in an expanding basilar PTX. The lung has re-expanded after being plalced back on water seal yesterday afternoon with trace residual PTX laterally.   Will leave on water seal through the weekend, repeat CXR on Monday.   Cecille Amsterdam RoddenberryPA-C [phone] 11/18/2020,10:59 AM

## 2020-11-18 NOTE — Progress Notes (Signed)
PROGRESS NOTE    Ronald Butler  ZOX:[id number] DOB: Feb 27, 1976 DOA: 10/28/2020 PCP: Patient, No Pcp Per (Inactive)   Brief Narrative: The patient is a 45 year old Caucasian male with a past medical history significant for but not limited to recurrent spontaneous pneumothorax previously refused VATS, polysubstance abuse, seizure disorder as well as other comorbidities who was apprehended by law enforcement and incarcerated and subsequently developed chest pain and shortness of breath and was found to have a right sided pneumothorax.  A chest tube was placed and [facility] was [facility] evaluation from [facility].  He subsequently underwent VATS with wedge resection on 10/30/2020. He was also found to be positive for COVID-19 incidentally.  On 11/02/2020 his chest tube was clamped and pneumothorax reoccurred with lung collapse.  Continues to have his chest tube to waterseal and an air leak so cardiothoracic surgery recommending continuing chest tube today.  Repeat CXR on 11/16/20 shows "Right chest tube in stable position. Persistent right base pneumothorax." Patient underwent a CT Chest to check for any further bullous disease and it showed "Small residual right basilar pneumothorax also with incomplete re-expansion of the superior segment of the right lower lobe despite well-positioned thoracostomy tube. Evidence of prior surgical resection of pulmonary blebs at the right lung apex and superior segment of the right lower lobe. Three small blebs present along the anterior aspect of the right lung apex. Mild centrilobular pulmonary emphysema."\  Clamping trial attempted 11/17/2020 resulted in an expanding basilar pneumothorax.  CT surgery now recommending leaving underwater seal through the weekend and repeating chest x-Jed Monday  Assessment & Plan:   Active Problems:   Pneumothorax  Right Sided Pneumothorax s/p Wedge Resection on 10/30/20 -CT Surgery  following and appreciate care and further management from them -The chest tube is to waterseal and there is a 1+ to 2 air leak with cough and the CXR this AM showed "Chest tube unchanged in position on the right with persistent right basilar pneumothorax. No tension component. Scarring right apex better seen on CT. No edema or airspace opacity. Heart size normal"; CT Surgery attempting Clamp Trial yesterday and it was clamped at 09:15 and chest x-[place] repeated and showed that the right basilar pneumothorax had expanded with a clamp trial so they placed the chest tube back to waterseal attempted clamp trial on 520 resulting in a expanded basilar pneumothorax he will remain on waterseal through the weekend with trace residual pneumothorax laterally per CT surgery recommendations and repeat chest x-Melchor Monday -Pnuemothorax improved but CT Surgery states to keep today given his airleak with cough and trace right apical pneumothorax -Cardiothoracic surgery recommending checking a CT of the chest today and it was done and showed "Small residual right basilar pneumothorax also with incomplete re-expansion of the superior segment of the right lower lobe despite well-positioned thoracostomy tube. Evidence of prior surgical resection of pulmonary blebs at the right lung apex and superior segment of the right lower lobe. Three small blebs present along the anterior aspect of the right lung apex. Mild centrilobular pulmonary emphysema." -Will need to repeat chest x-Jabree in a.m. and recommending continue incentive spirometer -PT/OT recommending Supervision and Rolling Walker with 5" Wheels  -He appears comfortable on examination and will Continue with pain control and patient has oxycodone IR 10 mg every 6 as needed for moderate pain and hydromorphone 1 mg IV every 4 as needed severe pain as well as p.o. acetaminophen 1000 mg every 8 hours scheduled -He  has been suspected to have Narcotic Seeking behavior -Continue with  bowel regimen with bisacodyl 10 mg p.o. daily and with MiraLAX 17 g p.o. daily in addition to senna docusate 1 tab p.o. nightly -Further Care per TCTS  COVID-19 -Incidental finding and Unclear how long he has had this -Observing for now -Off of Isolation -Tested Positive 10/28/20 -Last 3 PCT's have been Negative at <0.10  -D-Dimer trended up and went from 0.85 -> 3.15 today 0.55 -CRP went from 3.0 -> 1.6 -> 4.6 and is now 0.5 Recent Labs    11/18/20 0002  DDIMER 0.55*  FERRITIN 113  LDH 99  CRP 0.5    Lab Results  Component Value Date   SARSCOV2NAA POSITIVE (A) 10/28/2020   SARSCOV2NAA NEGATIVE 04/26/2020   SARSCOV2NAA NEGATIVE 04/18/2020  -SpO2: 98 % O2 Flow Rate (L/min): 2 L/min -Wean O2  -Refused sq Lovenox so was switched to Apixaban 2.5 mg po BID   Seizure Disorder -C/w Antiepileptics with divalproex thousand once p.o. daily as well as 750 mg p.o. nightly and with Lacosamide 100 g p.o. twice daily and with Zonisamide 300 mg p.o. nightly  Tobacco Abuse -Smoking cessation counseling given  -Reefusing a nicotine patch  Polystubstance Use -UDS was Positive for Cocaine and Opiates on Admission  -Counseling was given   Hypotension -BP remain softer -Last BP was 100/73 -Continue to Monitor and Trend -Repeat BP per Protocol   Normocytic Anemia  -Stable.  Patient's hemoglobin/hematocrit is now 11.2/33.8 -Checked Anemia Panel as he is anticoagulated with apixaban 2.5 mils p.o. twice daily and it showed an iron level of 66, U IBC of 403, TIBC 469, saturation ratios of 14%, ferritin of 133 and folate of 4.5 as well as vitamin B12 level 356 -Continue to Monitor for S/Sx of Bleeding; No overt bleeding noted -Repeat CBC intermittently   Hyperglycemia -Mild and Reactively likely -Blood Sugars have been ranging from 93-137 on Daily CMP's -Continue to Monitor and Trend intermittently -Last HbA1c was 5.5 in October 2021  DVT prophylaxis: Anticoagulated with Eliquis 2.5 mg  p.o. twice daily Code Status: FULL CODE  Family Communication: No family present at bedside  Disposition Plan: Pending further Clinical Improvement and Clearance by CT Surgery and removal of Chest Tube  Status is: Inpatient  Remains inpatient appropriate because:Unsafe d/c plan, IV treatments appropriate due to intensity of illness or inability to take PO and Inpatient level of care appropriate due to severity of illness   Dispo: The [facility] is from: [facility]              Anticipated d/c is to: Prison              Patient currently is not medically stable to d/c.   Difficult to place patient No  Consultants:   Cardiothoracic Surgery    Procedures: VATS with Wedge Resection  Antimicrobials:  Anti-infectives (From admission, onward)   Start     Dose/Rate Route Frequency Ordered Stop   10/30/20 2130  ceFAZolin (ANCEF) IVPB 2g/100 mL premix        2 g 200 mL/hr over 30 Minutes Intravenous Every 8 hours 10/30/20 1641 10/31/20 0719        Subjective: Seen and examined at bedside and states that he still has some pain near his clavicle and shortness of breath and wanted to rest and be left alone.  No nausea or vomiting.  No swelling in his legs.  Denies any lightheadedness or dizziness.  No other concerns or complaints at  this time  Objective: Vitals:   11/17/20 1936 11/17/20 2340 11/18/20 0348 11/18/20 0800  BP: 95/70 103/64 102/67 96/74  Pulse: 99 70 75 67  Resp: 15 (!) Temp: 98.5 F (36.9 C) 98.3 F (36.8 C) 98.6 F (37 C)   TempSrc: Oral Oral Oral   SpO2: 98% 98% 100% 98%  Weight:       No intake or output data in the 24 hours ending 11/18/20 1159 Filed Weights   10/28/20 0511 11/06/20 0404 11/13/20 0329  Weight: 67.4 kg 67 kg 67.4 kg   Examination: Physical Exam:  Constitutional: WN/WD thin Caucasian male currently in no acute distress appears calm and wanting to rest Eyes: Lids and conjunctivae normal, sclerae anicteric  ENMT: External Ears, Nose  appear normal. Grossly normal hearing Neck: Appears normal, supple, no cervical masses, normal ROM, no appreciable thyromegaly; no JVD Respiratory: Diminished to auscultation bilaterally, no wheezing, rales, rhonchi or crackles. Normal respiratory effort and patient is not tachypenic. No accessory muscle use.  Has a right-sided chest tube in place now back to waterseal Cardiovascular: RRR, no murmurs / rubs / gallops. S1 and S2 auscultated. No extremity edema.  Abdomen: Soft, non-tender, non-distended. Bowel sounds positive.  GU: Deferred. Musculoskeletal: No clubbing / cyanosis of digits/nails. No joint deformity upper and lower extremities.  Skin: No rashes, lesions, ulcers on limited skin evaluation. No induration; Warm and dry.  Neurologic: CN 2-12 grossly intact with no focal deficits. Romberg sign and cerebellar reflexes not assessed.  Psychiatric: Normal judgment and insight. Alert and oriented x 3. Normal mood and appropriate affect.   Data Reviewed: I have personally reviewed following labs and imaging studies  CBC: Recent Labs  Lab 11/12/20 0336 11/15/20 0808 11/17/20 0127 11/18/20 0002  WBC 7.1 9.0 7.9 6.9  NEUTROABS 4.2 5.8 4.6 4.2  HGB 11.3* 11.9* 11.0* 11.2*  HCT 34.9* 35.5* 33.4* 33.8*  MCV 92.3 91.0 91.3 90.6  PLT 305 291 273 248   Basic Metabolic Panel: Recent Labs  Lab 11/12/20 0336 11/15/20 0808 11/17/20 0127 11/18/20 0002  NA 136 137 136 137  K 3.6 4.1 3.5 3.9  CL 107 107 105 106  CO2 GLUCOSE 137* 98 105* 93  BUN CREATININE 0.98 0.93 0.94 0.93  CALCIUM 8.8* 9.0 8.8* 9.0  MG 2.0 2.0 1.9 2.0  PHOS  --  4.0 5.1* 4.1   GFR: Estimated Creatinine Clearance: 97.6 mL/min (by C-G formula based on SCr of 0.93 mg/dL). Liver Function Tests: Recent Labs  Lab 11/12/20 0336 11/15/20 0808 11/17/20 0127 11/18/20 0002  AST 10* 11* 9* 9*  ALT ALKPHOS 84 69 74 73  BILITOT 0.5 0.2* 0.3 0.5  PROT 6.2* 6.4* 6.1* 6.2*   ALBUMIN 3.1* 3.2* 3.1* 3.2*   Recent Labs  Lab 11/12/20 0336  LIPASE 27   No results for input(s): AMMONIA in the last 168 hours. Coagulation Profile: No results for input(s): INR, PROTIME in the last 168 hours. Cardiac Enzymes: No results for input(s): CKTOTAL, CKMB, CKMBINDEX, TROPONINI in the last 168 hours. BNP (last 3 results) No results for input(s): PROBNP in the last 8760 hours. HbA1C: No results for input(s): HGBA1C in the last 72 hours. CBG: No results for input(s): GLUCAP in the last 168 hours. Lipid Profile: No results for input(s): CHOL, HDL, LDLCALC, TRIG, CHOLHDL, LDLDIRECT in the last 72 hours. Thyroid Function Tests: No results for input(s): TSH, T4TOTAL,  FREET4, T3FREE, THYROIDAB in the last 72 hours. Anemia Panel: Recent Labs    11/18/20 0002  FERRITIN 113   Sepsis Labs: Recent Labs  Lab 11/12/20 0336  PROCALCITON <0.10    No results found for this or any previous visit (from the past 240 hour(s)).   RN Pressure Injury Documentation:     Estimated body mass index is 20.72 kg/m as calculated from the following:   Height as of 06/19/20: 5\' 11"  (1.803 m).   Weight as of this encounter: 67.4 kg.  Malnutrition Type:   Malnutrition Characteristics:    Nutrition Interventions:   Radiology Studies: DG CHEST PORT 1 VIEW  Result Date: 11/18/2020 CLINICAL DATA:  Evaluate pneumothorax EXAM: PORTABLE CHEST 1 VIEW COMPARISON:  Nov 17, 2020 FINDINGS: The basilar right pneumothorax is improved. There is now a small right lateral component measuring 6 mm, not seen on the previous study. The right chest tube remains in place. No left-sided pneumothorax. The cardiomediastinal silhouette is stable. No other acute abnormalities. IMPRESSION: The right chest tube remains in good position. Overall, the right-sided pneumothorax is smaller in the interval. Only a small lateral component measuring 6 mm is identified. Electronically Signed   By: Nov 19, 2020 III M.D    On: 11/18/2020 09:17   DG CHEST PORT 1 VIEW  Result Date: 11/17/2020 CLINICAL DATA:  Pneumothorax EXAM: PORTABLE CHEST 1 VIEW COMPARISON:  Nov 17, 2020 study obtained earlier in the day FINDINGS: Chest tube position unchanged. There has been interval enlargement of a right base right base pneumothorax without tension component. There is atelectatic change in the right base. Lungs elsewhere clear. Heart size and pulmonary vascularity are normal. No adenopathy. No bone lesions. Right base pneumothorax now present without tension component. IMPRESSION: Interval enlargement of right base pneumothorax without tension component. Chest tube position unchanged. Right base atelectasis. Lungs elsewhere clear. Stable cardiac silhouette. Critical Value/emergent results were called by telephone at the time of interpretation on 11/17/2020 at 4:19 pm to provider 11/19/2020, RN, who verbally acknowledged these results. She informed me that the provider was aware of the findings on this radiograph and had made appropriate adjustments to the chest tube. Electronically Signed   By: Newton Pigg III M.D.   On: 11/17/2020 16:19   DG CHEST PORT 1 VIEW  Result Date: 11/17/2020 CLINICAL DATA:  Chest pain.  Chest tube in place EXAM: PORTABLE CHEST 1 VIEW COMPARISON:  Chest radiograph and chest CT Nov 16, 2020 FINDINGS: Chest tube remains on the right. Right basilar pneumothorax again noted, better seen by CT. The focal area of presumed scarring in the right apex is less well seen by radiography than CT. No edema or airspace opacity. Heart size and pulmonary vascularity normal. No adenopathy. Postoperative change lower cervical spine. IMPRESSION: Chest tube unchanged in position on the right with persistent right basilar pneumothorax. No tension component. Scarring right apex better seen on CT. No edema or airspace opacity. Heart size normal Electronically Signed   By: Nov 18, 2020 III M.D.   On: 11/17/2020 08:20    Scheduled Meds: . acetaminophen  1,000 mg Oral Q8H  . apixaban  2.5 mg Oral BID  . bisacodyl  10 mg Oral Daily  . divalproex  500 mg Oral Daily  . divalproex  750 mg Oral QHS  . feeding supplement  237 mL Oral BID BM  . lacosamide  100 mg Oral BID  . polyethylene glycol  17 g Oral Daily  . senna-docusate  1 tablet  Oral QHS  . zonisamide  300 mg Oral QHS   Continuous Infusions:   LOS: 21 days   Merlene Laughter, DO [facility] PAGER is on AMION  If 7PM-7AM, please contact night-coverage [email or web address]

## 2020-11-19 NOTE — Progress Notes (Signed)
PROGRESS NOTE    Ronald Butler  FXT:[id number] DOB: 1976-04-01 DOA: 10/28/2020 PCP: Patient, No Pcp Per (Inactive)   Brief Narrative: The patient is a 45 year old Caucasian male with a past medical history significant for but not limited to recurrent spontaneous pneumothorax previously refused VATS, polysubstance abuse, seizure disorder as well as other comorbidities who was apprehended by law enforcement and incarcerated and subsequently developed chest pain and shortness of breath and was found to have a right sided pneumothorax.  A chest tube was placed and he was transferred to [name] for cardiothoracic surgery evaluation from [facility].  He subsequently underwent VATS with wedge resection on 10/30/2020. He was also found to be positive for COVID-19 incidentally.  On 11/02/2020 his chest tube was clamped and pneumothorax reoccurred with lung collapse.  Continues to have his chest tube to waterseal and an air leak so cardiothoracic surgery recommending continuing chest tube today.  Repeat CXR on 11/16/20 shows "Right chest tube in stable position. Persistent right base pneumothorax." Patient underwent a CT Chest to check for any further bullous disease and it showed "Small residual right basilar pneumothorax also with incomplete re-expansion of the superior segment of the right lower lobe despite well-positioned thoracostomy tube. Evidence of prior surgical resection of pulmonary blebs at the right lung apex and superior segment of the right lower lobe. Three small blebs present along the anterior aspect of the right lung apex. Mild centrilobular pulmonary emphysema."\  Clamping trial attempted 11/17/2020 but resulted in an expanding basilar pneumothorax.  CT surgery now recommending leaving Chest tube to waterseal through the weekend and repeating chest x-Leopoldo Monday. He continues to feel the same and wants to rest.   Assessment & Plan:   Active Problems:   Pneumothorax  Right Sided  Pneumothorax s/p Wedge Resection on 10/30/20 -CT Surgery following and appreciate care and further management from them -The chest tube is to waterseal and there is a 1+ to 2 air leak with cough and the CXR this AM showed "Chest tube unchanged in position on the right with persistent right basilar pneumothorax. No tension component. Scarring right apex better seen on CT. No edema or airspace opacity. Heart size normal"; CT Surgery attempting Clamp Trial yesterday and it was clamped at 09:15 and chest x-Ramadan repeated and showed that the right basilar pneumothorax had expanded with a clamp trial so they placed the chest tube back to waterseal attempted clamp trial on 520 resulting in a expanded basilar pneumothorax he will remain on waterseal through the weekend with trace residual pneumothorax laterally per CT surgery recommendations and repeat chest x-Jacky Monday -Pnuemothorax improved but CT Surgery states to keep today given his airleak with cough and trace right apical pneumothorax -Cardiothoracic surgery recommending checking a CT of the chest today and it was done and showed "Small residual right basilar pneumothorax also with incomplete re-expansion of the superior segment of the right lower lobe despite well-positioned thoracostomy tube. Evidence of prior surgical resection of pulmonary blebs at the right lung apex and superior segment of the right lower lobe. Three small blebs present along the anterior aspect of the right lung apex. Mild centrilobular pulmonary emphysema." -Will need to repeat chest x-Taylin in a.m. and recommending continue incentive spirometer -PT/OT recommending Supervision and Rolling Walker with 5" Wheels  -He appears comfortable on examination and will Continue with pain control and patient has oxycodone IR 10 mg every 6 as needed for moderate pain and hydromorphone 1 mg IV every 4 as needed  severe pain as well as p.o. acetaminophen 1000 mg every 8 hours scheduled -He has been  suspected to have Narcotic Seeking behavior -Continue with bowel regimen with bisacodyl 10 mg p.o. daily and with MiraLAX 17 g p.o. daily in addition to senna docusate 1 tab p.o. nightly -Further Care per TCTS  COVID-19 -Incidental finding and Unclear how long he has had this -Observing for now -Off of Isolation -Tested Positive 10/28/20 -Last 3 PCT's have been Negative at <0.10  -D-Dimer trended up and went from 0.85 -> 3.15 today 0.55 -CRP went from 3.0 -> 1.6 -> 4.6 and is now 0.5 Recent Labs    11/18/20 0002  DDIMER 0.55*  FERRITIN 113  LDH 99  CRP 0.5    Lab Results  Component Value Date   SARSCOV2NAA POSITIVE (A) 10/28/2020   SARSCOV2NAA NEGATIVE 04/26/2020   SARSCOV2NAA NEGATIVE 04/18/2020  -SpO2: 98 % O2 Flow Rate (L/min): 2 L/min -Wean O2  -Refused sq Lovenox so was switched to Apixaban 2.5 mg po BID   Seizure Disorder -C/w Antiepileptics with divalproex thousand once p.o. daily as well as 750 mg p.o. nightly and with Lacosamide 100 g p.o. twice daily and with Zonisamide 300 mg p.o. nightly  Tobacco Abuse -Smoking cessation counseling given  -Reefusing a nicotine patch  Polystubstance Use -UDS was Positive for Cocaine and Opiates on Admission  -Counseling was given   Hypotension -BP remain softer -Last BP was 97/71 -Continue to Monitor and Trend -Repeat BP per Protocol   Normocytic Anemia  -Stable.  Patient's hemoglobin/hematocrit is now 11.2/33.8 on last check (11/18/20) -Checked Anemia Panel as he is anticoagulated with apixaban 2.5 mils p.o. twice daily and it showed an iron level of 66, U IBC of 403, TIBC 469, saturation ratios of 14%, ferritin of 133 and folate of 4.5 as well as vitamin B12 level 356 -Continue to Monitor for S/Sx of Bleeding; No overt bleeding noted -Repeat CBC intermittently   Hyperglycemia -Mild and Reactively likely -Blood Sugars have been ranging from 93-137 on Daily CMP's -Continue to Monitor and Trend intermittently -Last  HbA1c was 5.5 in October 2021  DVT prophylaxis: Anticoagulated with Eliquis 2.5 mg p.o. twice daily Code Status: FULL CODE  Family Communication: No family present at bedside  Disposition Plan: Pending further Clinical Improvement and Clearance by CT Surgery and removal of Chest Tube  Status is: Inpatient  Remains inpatient appropriate because:Unsafe d/c plan, IV treatments appropriate due to intensity of illness or inability to take PO and Inpatient level of care appropriate due to severity of illness   Dispo: The patient is from: Prison              Anticipated d/c is to: Prison              Patient currently is not medically stable to d/c.   Difficult to place patient No  Consultants:   Cardiothoracic Surgery    Procedures: VATS with Wedge Resection  Antimicrobials:  Anti-infectives (From admission, onward)   Start     Dose/Rate Route Frequency Ordered Stop   10/30/20 2130  ceFAZolin (ANCEF) IVPB 2g/100 mL premix        2 g 200 mL/hr over 30 Minutes Intravenous Every 8 hours 10/30/20 1641 10/31/20 0719        Subjective: Seen and examined at bedside and he still feels about the same as yesterday and continues to have some mild shortness of breath and pain at the chest tube site.  Wants to be  left alone and sleep.  No lightheadedness or dizziness.  No other concerns or complaints at this time.  Objective: Vitals:   11/18/20 2342 11/19/20 0351 11/19/20 0743 11/19/20 1100  BP: 92/61 104/66 102/73 97/71  Pulse: 90 80 68 76  Resp: 17 16 16 18   Temp: 98.6 F (37 C)     TempSrc: Oral     SpO2: 98% 97%  98%  Weight:       No intake or output data in the 24 hours ending 11/19/20 1354 Filed Weights   10/28/20 0511 11/06/20 0404 11/13/20 0329  Weight: 67.4 kg 67 kg 67.4 kg   Examination: Physical Exam:  Constitutional: WN/WD thin Caucasian male currently in no acute distress appears calm and wanting to rest Eyes: Lids and conjunctivae normal, sclerae anicteric   ENMT: External Ears, Nose appear normal. Grossly normal hearing. Neck: Appears normal, supple, no cervical masses, normal ROM, no appreciable thyromegaly; no JVD Respiratory: Diminished to auscultation bilaterally, no wheezing, rales, rhonchi or crackles. Normal respiratory effort and patient is not tachypenic. No accessory muscle use.  Not wearing supplemental oxygen via nasal cannula but has a right-sided chest tube in place that is connected to waterseal Cardiovascular: RRR, no murmurs / rubs / gallops. S1 and S2 auscultated. No extremity edema.  Abdomen: Soft, non-tender, non-distended. Bowel sounds positive.  GU: Deferred. Musculoskeletal: No clubbing / cyanosis of digits/nails. No joint deformity upper and lower extremities.  Skin: No rashes, lesions, ulcers on limited skin evaluation. No induration; Warm and dry.  Neurologic: CN 2-12 grossly intact with no focal deficits. Romberg sign and cerebellar reflexes not assessed.  Psychiatric: Normal judgment and insight. Alert and oriented x 3.  Slightly withdrawn mood and appropriate affect.   Data Reviewed: I have personally reviewed following labs and imaging studies  CBC: Recent Labs  Lab 11/15/20 0808 11/17/20 0127 11/18/20 0002  WBC 9.0 7.9 6.9  NEUTROABS 5.8 4.6 4.2  HGB 11.9* 11.0* 11.2*  HCT 35.5* 33.4* 33.8*  MCV 91.0 91.3 90.6  PLT 291 273 248   Basic Metabolic Panel: Recent Labs  Lab 11/15/20 0808 11/17/20 0127 11/18/20 0002  NA 137 136 137  K 4.1 3.5 3.9  CL 107 105 106  CO2 24 26 23   GLUCOSE 98 105* 93  BUN 18 18 15   CREATININE 0.93 0.94 0.93  CALCIUM 9.0 8.8* 9.0  MG 2.0 1.9 2.0  PHOS 4.0 5.1* 4.1   GFR: Estimated Creatinine Clearance: 97.6 mL/min (by C-G formula based on SCr of 0.93 mg/dL). Liver Function Tests: Recent Labs  Lab 11/15/20 0808 11/17/20 0127 11/18/20 0002  AST 11* 9* 9*  ALT 13 9 11   ALKPHOS 69 74 73  BILITOT 0.2* 0.3 0.5  PROT 6.4* 6.1* 6.2*  ALBUMIN 3.2* 3.1* 3.2*   No  results for input(s): LIPASE, AMYLASE in the last 168 hours. No results for input(s): AMMONIA in the last 168 hours. Coagulation Profile: No results for input(s): INR, PROTIME in the last 168 hours. Cardiac Enzymes: No results for input(s): CKTOTAL, CKMB, CKMBINDEX, TROPONINI in the last 168 hours. BNP (last 3 results) No results for input(s): PROBNP in the last 8760 hours. HbA1C: No results for input(s): HGBA1C in the last 72 hours. CBG: No results for input(s): GLUCAP in the last 168 hours. Lipid Profile: No results for input(s): CHOL, HDL, LDLCALC, TRIG, CHOLHDL, LDLDIRECT in the last 72 hours. Thyroid Function Tests: No results for input(s): TSH, T4TOTAL, FREET4, T3FREE, THYROIDAB in the last 72 hours. Anemia Panel:  Recent Labs    11/18/20 0002  FERRITIN 113   Sepsis Labs: No results for input(s): PROCALCITON, LATICACIDVEN in the last 168 hours.  No results found for this or any previous visit (from the past 240 hour(s)).   RN Pressure Injury Documentation:     Estimated body mass index is 20.72 kg/m as calculated from the following:   Height as of 06/19/20: 5\' 11"  (1.803 m).   Weight as of this encounter: 67.4 kg.  Malnutrition Type:   Malnutrition Characteristics:    Nutrition Interventions:   Radiology Studies: DG CHEST PORT 1 VIEW  Result Date: 11/18/2020 CLINICAL DATA:  Evaluate pneumothorax EXAM: PORTABLE CHEST 1 VIEW COMPARISON:  Nov 17, 2020 FINDINGS: The basilar right pneumothorax is improved. There is now a small right lateral component measuring 6 mm, not seen on the previous study. The right chest tube remains in place. No left-sided pneumothorax. The cardiomediastinal silhouette is stable. No other acute abnormalities. IMPRESSION: The right chest tube remains in good position. Overall, the right-sided pneumothorax is smaller in the interval. Only a small lateral component measuring 6 mm is identified. Electronically Signed   By: Nov 19, 2020 III M.D    On: 11/18/2020 09:17   DG CHEST PORT 1 VIEW  Result Date: 11/17/2020 CLINICAL DATA:  Pneumothorax EXAM: PORTABLE CHEST 1 VIEW COMPARISON:  Nov 17, 2020 study obtained earlier in the day FINDINGS: Chest tube position unchanged. There has been interval enlargement of a right base right base pneumothorax without tension component. There is atelectatic change in the right base. Lungs elsewhere clear. Heart size and pulmonary vascularity are normal. No adenopathy. No bone lesions. Right base pneumothorax now present without tension component. IMPRESSION: Interval enlargement of right base pneumothorax without tension component. Chest tube position unchanged. Right base atelectasis. Lungs elsewhere clear. Stable cardiac silhouette. Critical Value/emergent results were called by telephone at the time of interpretation on 11/17/2020 at 4:19 pm to provider 11/19/2020, RN, who verbally acknowledged these results. She informed me that the provider was aware of the findings on this radiograph and had made appropriate adjustments to the chest tube. Electronically Signed   By: Newton Pigg III M.D.   On: 11/17/2020 16:19   Scheduled Meds: . acetaminophen  1,000 mg Oral Q8H  . apixaban  2.5 mg Oral BID  . bisacodyl  10 mg Oral Daily  . divalproex  500 mg Oral Daily  . divalproex  750 mg Oral QHS  . feeding supplement  237 mL Oral BID BM  . lacosamide  100 mg Oral BID  . polyethylene glycol  17 g Oral Daily  . senna-docusate  1 tablet Oral QHS  . zonisamide  300 mg Oral QHS   Continuous Infusions:   LOS: 22 days   11/19/2020, DO [facility] PAGER is on AMION  If 7PM-7AM, please contact night-coverage [email or web address]

## 2020-11-20 ENCOUNTER — Inpatient Hospital Stay (HOSPITAL_COMMUNITY): Payer: Self-pay

## 2020-11-20 NOTE — Progress Notes (Signed)
      [address]       [place] [address]             [phone]       21 Days Post-Op Procedure(s) (LRB): XI ROBOTIC ASSISTED THORASCOPY-WEDGE RESECTION,UPPER LOBE AND LOWER LOBE (Right) INTERCOSTAL NERVE BLOCK (Right) PLEURECTOMY (Right)  Subjective: Patient requesting pain medicaiton again this am  Objective: Vital signs in last 24 hours: Temp:  [97.6 F (36.4 C)-98.5 F (36.9 C)] 98 F (36.7 C) (05/23 0349) Pulse Rate:  [72-86] 76 (05/23 0727) Cardiac Rhythm: Normal sinus rhythm (05/22 1923) Resp:  [14-18] 14 (05/23 0727) BP: (97-122)/(68-88) 122/88 (05/23 0727) SpO2:  [98 %-100 %] 100 % (05/23 0727)     Intake/Output from previous day: No intake/output data recorded.   Physical Exam:  Cardiovascular: RRR Pulmonary: Clear to auscultation bilaterally Abdomen: Soft, non tender, bowel sounds present. Wounds: Clean and dry.  No erythema or signs of infection. Chest Tube: to water seal, +1 intermittent air leak with cough  Lab Results: CBC: Recent Labs    11/18/20 0002  WBC 6.9  HGB 11.2*  HCT 33.8*  PLT 248   BMET:  Recent Labs    11/18/20 0002  NA 137  K 3.9  CL 106  CO2 23  GLUCOSE 93  BUN 15  CREATININE 0.93  CALCIUM 9.0    PT/INR: No results for input(s): LABPROT, INR in the last 72 hours. ABG:  INR: Will add last result for INR, ABG once components are confirmed Will add last 4 CBG results once components are confirmed  Assessment/Plan:  1. CV - SR 2.  Pulmonary - Patient failed clamping chest tube over the weekend (increased pneumothorax). Chest tube with scant output last 24 hours. Chest tube is to water seal and there is a +1 intermittent air leak with cough. CXR this am shows trace right apical pneumothorax. Leave chest tube to water seal for today.  Will discuss with Dr. Cliffton Asters. Encourage incentive spirometer.   D.Zimmerman, PA-C  Agree with above WS today.  Another clamping trial tomorrow.  Danaya Geddis Keane Scrape

## 2020-11-20 NOTE — Progress Notes (Signed)
Physical Therapy Treatment Patient Details Name: Ronald Butler MRN: [id number] DOB: [birth year] Today's Date: 11/20/2020    History of Present Illness 45 yo male adm 5/53 from police custody with SOB and chest pain. 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  Found incidental Covid. On 5/2 pt had wedge resection of rt upper and lower lobes, apical pleurectomy, and mechanical pleurodesis. Chest tube in place.  PMHx: pancreatitis, recurrent pneumothorax, seizures, cholecystectomy, smoker, THC    PT Comments    Patient received in bed, still very pain limited but did tolerate getting to EOB and standing for about 30 seconds today. Refused side steps or ambulation with RW due to pain. Left in bed with all needs met, bed alarm active. Dropping frequency to 2x/week due to limited participation secondary to chest tube related pain, will plan to re-increase once chest tube is removed and he can hopefully tolerate more.     Follow Up Recommendations  Supervision - Intermittent     Equipment Recommendations  Rolling walker with 5" wheels    Recommendations for Other Services       Precautions / Restrictions Precautions Precautions: Fall;Other (comment) Precaution Comments: chest tube (has been going back and forth between water seal and suction) Restrictions Weight Bearing Restrictions: No    Mobility  Bed Mobility Overal bed mobility: Needs Assistance Bed Mobility: Supine to Sit;Sit to Supine     Supine to sit: Independent;HOB elevated Sit to supine: Independent;HOB elevated   General bed mobility comments: HOB only mildly elevated, assist for line management    Transfers Overall transfer level: Needs assistance Equipment used: Rolling walker (2 wheeled) Transfers: Sit to/from Stand Sit to Stand: Supervision         General transfer comment: S for safety/line management, able to boost to standing much better today. Refused taking side steps due to  pain  Ambulation/Gait             General Gait Details: unable, pain   Stairs             Wheelchair Mobility    Modified Rankin (Stroke Patients Only)       Balance Overall balance assessment: Mild deficits observed, not formally tested   Sitting balance-Leahy Scale: Normal     Standing balance support: Bilateral upper extremity supported;During functional activity Standing balance-Leahy Scale: Fair Standing balance comment: reliant on BUE support                            Cognition Arousal/Alertness: Awake/alert Behavior During Therapy: Flat affect Overall Cognitive Status: Within Functional Limits for tasks assessed                                 General Comments: more flat affect today but cooperative, limited by pain      Exercises      General Comments        Pertinent Vitals/Pain Pain Assessment: Faces Faces Pain Scale: Hurts even more Pain Location: chest tube site and incision Pain Descriptors / Indicators: Operative site guarding;Grimacing Pain Intervention(s): Limited activity within patient's tolerance;Monitored during session    Home Living                      Prior Function            PT Goals (current goals can now be found in the  care plan section) Acute Rehab PT Goals Patient Stated Goal: to get pain managed PT Goal Formulation: With patient Time For Goal Achievement: 11/23/20 Potential to Achieve Goals: Good Progress towards PT goals: Not progressing toward goals - comment (pain limited)    Frequency    Min 2X/week      PT Plan Frequency needs to be updated    Co-evaluation              AM-PAC PT "6 Clicks" Mobility   Outcome Measure  Help needed turning from your back to your side while in a flat bed without using bedrails?: None Help needed moving from lying on your back to sitting on the side of a flat bed without using bedrails?: None Help needed moving to and  from a bed to a chair (including a wheelchair)?: A Little Help needed standing up from a chair using your arms (e.g., wheelchair or bedside chair)?: A Little Help needed to walk in hospital room?: A Little Help needed climbing 3-5 steps with a railing? : A Little 6 Click Score: 20    End of Session   Activity Tolerance: Patient limited by pain Patient left: in bed;with call bell/phone within reach Nurse Communication: Mobility status PT Visit Diagnosis: Difficulty in walking, not elsewhere classified (R26.2);Pain Pain - Right/Left: Right Pain - part of body:  (chest wall)     Time: 8891-6945 PT Time Calculation (min) (ACUTE ONLY): 10 min  Charges:  $Therapeutic Activity: 8-22 mins                     Windell Norfolk, DPT, PN1   Supplemental Physical Therapist [place]    Pager [phone] Acute Rehab Office [phone]

## 2020-11-20 NOTE — Progress Notes (Signed)
      [address]       [place] [address]             [phone]       21 Days Post-Op Procedure(s) (LRB): XI ROBOTIC ASSISTED THORASCOPY-WEDGE RESECTION,UPPER LOBE AND LOWER LOBE (Right) INTERCOSTAL NERVE BLOCK (Right) PLEURECTOMY (Right)  Subjective: Patient requesting pain medicaiton again this am  Objective: Vital signs in last 24 hours: Temp:  [97.6 F (36.4 C)-98.5 F (36.9 C)] 98 F (36.7 C) (05/23 0349) Pulse Rate:  [68-86] 72 (05/23 0349) Cardiac Rhythm: Normal sinus rhythm (05/22 1923) Resp:  [14-18] 15 (05/23 0349) BP: (97-109)/(68-73) 109/73 (05/23 0349) SpO2:  [98 %-100 %] 98 % (05/23 0349)     Intake/Output from previous day: No intake/output data recorded.   Physical Exam:  Cardiovascular: RRR Pulmonary: Clear to auscultation bilaterally Abdomen: Soft, non tender, bowel sounds present. Wounds: Clean and dry.  No erythema or signs of infection. Chest Tube: to water seal, +1 intermittent air leak with cough  Lab Results: CBC: Recent Labs    11/18/20 0002  WBC 6.9  HGB 11.2*  HCT 33.8*  PLT 248   BMET:  Recent Labs    11/18/20 0002  NA 137  K 3.9  CL 106  CO2 23  GLUCOSE 93  BUN 15  CREATININE 0.93  CALCIUM 9.0    PT/INR: No results for input(s): LABPROT, INR in the last 72 hours. ABG:  INR: Will add last result for INR, ABG once components are confirmed Will add last 4 CBG results once components are confirmed  Assessment/Plan:  1. CV - SR 2.  Pulmonary - Patient failed clamping chest tube over the weekend (increased pneumothorax). Chest tube with scant output last 24 hours. Chest tube is to water seal and there is a +1 intermittent air leak with cough. CXR this am shows trace right apical pneumothorax. Leave chest tube to water seal for today.  Will discuss with Dr. Cliffton Asters. Encourage incentive spirometer.   Saharah Sherrow M ZimmermanPA-C 11/20/2020,7:16 AM [phone]

## 2020-11-20 NOTE — Progress Notes (Signed)
PROGRESS NOTE    Ronald Butler  UUV:[id number] DOB: 09-29-1975 DOA: 10/28/2020 PCP: Patient, No Pcp Per (Inactive)   Brief Narrative: The patient is a 45 year old Caucasian male with a past medical history significant for but not limited to recurrent spontaneous pneumothorax previously refused VATS, polysubstance abuse, seizure disorder as well as other comorbidities who was apprehended by law enforcement and incarcerated and subsequently developed chest pain and shortness of breath and was found to have a right sided pneumothorax.  A chest tube was placed and he was transferred to [name] for cardiothoracic surgery evaluation from [facility].  He subsequently underwent VATS with wedge resection on 10/30/2020. He was also found to be positive for COVID-19 incidentally.  On 11/02/2020 his chest tube was clamped and pneumothorax reoccurred with lung collapse.  Continues to have his chest tube to waterseal and an air leak so cardiothoracic surgery recommending continuing chest tube.  Repeat CXR on 11/16/20 shows "Right chest tube in stable position. Persistent right base pneumothorax." Patient underwent a CT Chest to check for any further bullous disease and it showed "Small residual right basilar pneumothorax also with incomplete re-expansion of the superior segment of the right lower lobe despite well-positioned thoracostomy tube. Evidence of prior surgical resection of pulmonary blebs at the right lung apex and superior segment of the right lower lobe. Three small blebs present along the anterior aspect of the right lung apex. Mild centrilobular pulmonary emphysema."\  Clamping trial attempted 11/17/2020 but resulted in an expanding basilar pneumothorax.  CT surgery now recommending leaving Chest tube to waterseal through the weekend and repeating chest x-Kamarian Monday which continues to show trace Right Apical Pneumothorax. CT Surgery recommending leaving to water seal again today and attempt  another clamping trial again tomorrow. They continue to encourage Incentive Spirometry.   Assessment & Plan:   Active Problems:   Pneumothorax  Right Sided Pneumothorax s/p Wedge Resection on 10/30/20 -CT Surgery following and appreciate care and further management from them -The chest tube is to waterseal and there is a 1+ to 2 air leak with cough and the CXR this AM showed "Stable small right pneumothorax. Stable position of the right chest tube. No focal lung disease." -CT Surgery attempted Clamping Trial 11/17/20 and chest x-Random repeated and showed that the right basilar pneumothorax had expanded with a clamp trial so they placed the chest tube back to waterseal attempted clamp trial on 520 resulting in a expanded basilar pneumothorax he will remain on waterseal through the weekend with trace residual pneumothorax laterally per CT surgery recommendations and after re-evaluation again today they recommending leaving to Waterseal again today and attempting another clamping trial 11/21/20 -Pnuemothorax improved but CT Surgery states to keep today given his airleak with cough and trace right apical pneumothorax -Cardiothoracic surgery recommending checking a CT of the chest on 11/16/20  and showed "Small residual right basilar pneumothorax also with incomplete re-expansion of the superior segment of the right lower lobe despite well-positioned thoracostomy tube. Evidence of prior surgical resection of pulmonary blebs at the right lung apex and superior segment of the right lower lobe. Three small blebs present along the anterior aspect of the right lung apex. Mild centrilobular pulmonary emphysema." -Will need to repeat chest x-Jilberto in a.m. and recommending continue incentive spirometer -PT/OT recommending Supervision and Rolling Walker with 5" Wheels  -He appears comfortable on examination and will Continue with pain control and patient has oxycodone IR 10 mg every 6 as needed for moderate pain and  hydromorphone 1 mg IV every 4 as needed severe pain as well as p.o. acetaminophen 1000 mg every 8 hours scheduled -He has been suspected to have Narcotic Seeking behavior -C/w Incentive Spirometry  -Continue with bowel regimen with bisacodyl 10 mg p.o. daily and with MiraLAX 17 g p.o. daily in addition to senna docusate 1 tab p.o. nightly -Further Care per TCTS  COVID-19 -Incidental finding and Unclear how long he has had this -Observing for now -Off of Isolation -Tested Positive 10/28/20 -Last 3 PCT's have been Negative at <0.10  -D-Dimer trended up and went from 0.85 -> 3.15 today 0.55 -CRP went from 3.0 -> 1.6 -> 4.6 and is now 0.5 Recent Labs    11/18/20 0002  DDIMER 0.55*  FERRITIN 113  LDH 99  CRP 0.5    Lab Results  Component Value Date   SARSCOV2NAA POSITIVE (A) 10/28/2020   SARSCOV2NAA NEGATIVE 04/26/2020   SARSCOV2NAA NEGATIVE 04/18/2020  -SpO2: 100 % O2 Flow Rate (L/min): 2 L/min -Wean O2  -Refused sq Lovenox so was switched to Apixaban 2.5 mg po BID   Seizure Disorder -C/w Antiepileptics with divalproex thousand once p.o. daily as well as 750 mg p.o. nightly and with Lacosamide 100 g p.o. twice daily and with Zonisamide 300 mg p.o. nightly  Tobacco Abuse -Smoking cessation counseling given  -Reefusing a nicotine patch  Polystubstance Use -UDS was Positive for Cocaine and Opiates on Admission  -Counseling was given   Hypotension -BP remain softer -Last BP was 97/71 -Continue to Monitor and Trend -Repeat BP per Protocol   Normocytic Anemia  -Stable.  Patient's hemoglobin/hematocrit is now 11.2/33.8 on last check (11/18/20) -Checked Anemia Panel as he is anticoagulated with apixaban 2.5 mils p.o. twice daily and it showed an iron level of 66, U IBC of 403, TIBC 469, saturation ratios of 14%, ferritin of 133 and folate of 4.5 as well as vitamin B12 level 356 -Continue to Monitor for S/Sx of Bleeding; No overt bleeding noted -Repeat CBC intermittently    Hyperglycemia -Mild and Reactively likely -Blood Sugars have been ranging from 93-137 on Daily CMP's -Continue to Monitor and Trend intermittently -Last HbA1c was 5.5 in October 2021  DVT prophylaxis: Anticoagulated with Eliquis 2.5 mg p.o. twice daily Code Status: FULL CODE  Family Communication: No family present at bedside  Disposition Plan: Pending further Clinical Improvement and Clearance by CT Surgery and removal of Chest Tube  Status is: Inpatient  Remains inpatient appropriate because:Unsafe d/c plan, IV treatments appropriate due to intensity of illness or inability to take PO and Inpatient level of care appropriate due to severity of illness   Dispo: [facility] from[facility]              Anticipated d/c is to: Prison              Patient currently is not medically stable to d/c.   Difficult to place patient No  Consultants:   Cardiothoracic Surgery    Procedures: VATS with Wedge Resection  Antimicrobials:  Anti-infectives (From admission, onward)   Start     Dose/Rate Route Frequency Ordered Stop   10/30/20 2130  ceFAZolin (ANCEF) IVPB 2g/100 mL premix        2 g 200 mL/hr over 30 Minutes Intravenous Every 8 hours 10/30/20 1641 10/31/20 0719        Subjective: Seen and examined at bedside and was frustrated and still having pain and nausea due to the pain. No lightheadedness or dizziness. Continues  to have some SOB. No other concerns or complaints at this time.   Objective: Vitals:   11/19/20 1929 11/19/20 2359 11/20/20 0349 11/20/20 0727  BP: 103/68 97/69 109/73 122/88  Pulse: 81 86 72 76  Resp: 15 14 15 14   Temp: 97.6 F (36.4 C) 98.5 F (36.9 C) 98 F (36.7 C)   TempSrc: Oral Oral Oral   SpO2: 98% 98% 98% 100%  Weight:        Intake/Output Summary (Last 24 hours) at 11/20/2020 11/22/2020 Last data filed at 11/20/2020 11/22/2020 Gross per 24 hour  Intake 0 ml  Output 350 ml  Net -350 ml   Filed Weights   10/28/20 0511 11/06/20 0404 11/13/20 0329   Weight: 67.4 kg 67 kg 67.4 kg   Examination: Physical Exam:  Constitutional: WN/WD thin Caucasian male in NAD and appears calm and comfortable Eyes: Lids and conjunctivae normal, sclerae anicteric  ENMT: External Ears, Nose appear normal. Grossly normal hearing.   Neck: Appears normal, supple, no cervical masses, normal ROM, no appreciable thyromegaly; no JVD Respiratory: Diminished to auscultation bilaterally with coarse breath sounds, no wheezing, rales, rhonchi or crackles. Normal respiratory effort and patient is not tachypenic. No accessory muscle use. Has a Right sided Chest Tube in place connected to waterseal Cardiovascular: RRR, no murmurs / rubs / gallops. S1 and S2 auscultated. No extremity edema. Abdomen: Soft, non-tender, non-distended. Bowel sounds positive.  GU: Deferred. Musculoskeletal: No clubbing / cyanosis of digits/nails. No joint deformity upper and lower extremities.  Skin: No rashes, lesions, ulcers on a limited skin evaluation. No induration; Warm and dry.  Neurologic: CN 2-12 grossly intact with no focal deficits. Romberg sign and cerebellar reflexes not assessed.  Psychiatric: Normal judgment and insight. Alert and oriented x 3. Normal mood and appropriate affect.   Data Reviewed: I have personally reviewed following labs and imaging studies  CBC: Recent Labs  Lab 11/15/20 0808 11/17/20 0127 11/18/20 0002  WBC 9.0 7.9 6.9  NEUTROABS 5.8 4.6 4.2  HGB 11.9* 11.0* 11.2*  HCT 35.5* 33.4* 33.8*  MCV 91.0 91.3 90.6  PLT 291 273 248   Basic Metabolic Panel: Recent Labs  Lab 11/15/20 0808 11/17/20 0127 11/18/20 0002  NA 137 136 137  K 4.1 3.5 3.9  CL 107 105 106  CO2 24 26 23   GLUCOSE 98 105* 93  BUN 18 18 15   CREATININE 0.93 0.94 0.93  CALCIUM 9.0 8.8* 9.0  MG 2.0 1.9 2.0  PHOS 4.0 5.1* 4.1   GFR: Estimated Creatinine Clearance: 97.6 mL/min (by C-G formula based on SCr of 0.93 mg/dL). Liver Function Tests: Recent Labs  Lab 11/15/20 0808  11/17/20 0127 11/18/20 0002  AST 11* 9* 9*  ALT 13 9 11   ALKPHOS 69 74 73  BILITOT 0.2* 0.3 0.5  PROT 6.4* 6.1* 6.2*  ALBUMIN 3.2* 3.1* 3.2*   No results for input(s): LIPASE, AMYLASE in the last 168 hours. No results for input(s): AMMONIA in the last 168 hours. Coagulation Profile: No results for input(s): INR, PROTIME in the last 168 hours. Cardiac Enzymes: No results for input(s): CKTOTAL, CKMB, CKMBINDEX, TROPONINI in the last 168 hours. BNP (last 3 results) No results for input(s): PROBNP in the last 8760 hours. HbA1C: No results for input(s): HGBA1C in the last 72 hours. CBG: No results for input(s): GLUCAP in the last 168 hours. Lipid Profile: No results for input(s): CHOL, HDL, LDLCALC, TRIG, CHOLHDL, LDLDIRECT in the last 72 hours. Thyroid Function Tests: No results for input(s):  TSH, T4TOTAL, FREET4, T3FREE, THYROIDAB in the last 72 hours. Anemia Panel: Recent Labs    11/18/20 0002  FERRITIN 113   Sepsis Labs: No results for input(s): PROCALCITON, LATICACIDVEN in the last 168 hours.  No results found for this or any previous visit (from the past 240 hour(s)).   RN Pressure Injury Documentation:     Estimated body mass index is 20.72 kg/m as calculated from the following:   Height as of 06/19/20: 5\' 11"  (1.803 m).   Weight as of this encounter: 67.4 kg.  Malnutrition Type:   Malnutrition Characteristics:    Nutrition Interventions:   Radiology Studies: No results found. Scheduled Meds: . acetaminophen  1,000 mg Oral Q8H  . apixaban  2.5 mg Oral BID  . bisacodyl  10 mg Oral Daily  . divalproex  500 mg Oral Daily  . divalproex  750 mg Oral QHS  . feeding supplement  237 mL Oral BID BM  . lacosamide  100 mg Oral BID  . polyethylene glycol  17 g Oral Daily  . senna-docusate  1 tablet Oral QHS  . zonisamide  300 mg Oral QHS   Continuous Infusions:   LOS: 23 days   , DO [facility] PAGER is on AMION  If 7PM-7AM,  please contact night-coverage [email or web address]

## 2020-11-21 ENCOUNTER — Inpatient Hospital Stay (HOSPITAL_COMMUNITY): Payer: Self-pay

## 2020-11-21 DIAGNOSIS — R109 Unspecified abdominal pain: Secondary | ICD-10-CM

## 2020-11-21 LAB — CBC WITH DIFFERENTIAL/PLATELET
Abs Immature Granulocytes: 0.02 10*3/uL (ref 0.00–0.07)
Basophils Absolute: 0.1 10*3/uL (ref 0.0–0.1)
Basophils Relative: 1 %
Eosinophils Absolute: 0.1 10*3/uL (ref 0.0–0.5)
Eosinophils Relative: 2 %
HCT: 39.2 % (ref 39.0–52.0)
Hemoglobin: 13 g/dL (ref 13.0–17.0)
Immature Granulocytes: 0 %
Lymphocytes Relative: 22 %
Lymphs Abs: 1.8 10*3/uL (ref 0.7–4.0)
MCH: 30.8 pg (ref 26.0–34.0)
MCHC: 33.2 g/dL (ref 30.0–36.0)
MCV: 92.9 fL (ref 80.0–100.0)
Monocytes Absolute: 0.6 10*3/uL (ref 0.1–1.0)
Monocytes Relative: 7 %
Neutro Abs: 5.5 10*3/uL (ref 1.7–7.7)
Neutrophils Relative %: 68 %
Platelets: 244 10*3/uL (ref 150–400)
RBC: 4.22 MIL/uL (ref 4.22–5.81)
RDW: 13.3 % (ref 11.5–15.5)
WBC: 8.1 10*3/uL (ref 4.0–10.5)
nRBC: 0 % (ref 0.0–0.2)

## 2020-11-21 LAB — COMPREHENSIVE METABOLIC PANEL
ALT: 11 U/L (ref 0–44)
AST: 10 U/L — ABNORMAL LOW (ref 15–41)
Albumin: 3.6 g/dL (ref 3.5–5.0)
Alkaline Phosphatase: 66 U/L (ref 38–126)
Anion gap: 10 (ref 5–15)
BUN: 13 mg/dL (ref 6–20)
CO2: 24 mmol/L (ref 22–32)
Calcium: 9.6 mg/dL (ref 8.9–10.3)
Chloride: 105 mmol/L (ref 98–111)
Creatinine, Ser: 0.95 mg/dL (ref 0.61–1.24)
GFR, Estimated: >60 mL/min (ref >60)
Glucose, Bld: 109 mg/dL — ABNORMAL HIGH (ref 70–99)
Potassium: 3.8 mmol/L (ref 3.5–5.1)
Sodium: 139 mmol/L (ref 135–145)
Total Bilirubin: 0.4 mg/dL (ref 0.3–1.2)
Total Protein: 6.9 g/dL (ref 6.5–8.1)

## 2020-11-21 LAB — LIPASE, BLOOD: Lipase: 25 U/L (ref 11–51)

## 2020-11-21 LAB — MAGNESIUM: Magnesium: 1.9 mg/dL (ref 1.7–2.4)

## 2020-11-21 LAB — PHOSPHORUS: Phosphorus: 4 mg/dL (ref 2.5–4.6)

## 2020-11-21 NOTE — Progress Notes (Addendum)
      [address]       [place] [id number]             [phone]       22 Days Post-Op Procedure(s) (LRB): XI ROBOTIC ASSISTED THORASCOPY-WEDGE RESECTION,UPPER LOBE AND LOWER LOBE (Right) INTERCOSTAL NERVE BLOCK (Right) PLEURECTOMY (Right)  Subjective: Patient requesting pain medicaiton again this am, as he does most mornings. He denies problems with breathing.  Objective: Vital signs in last 24 hours: Temp:  [98 F (36.7 C)-98.5 F (36.9 C)] 98.3 F (36.8 C) (05/24 0353) Pulse Rate:  [64-79] 64 (05/24 0353) Cardiac Rhythm: Normal sinus rhythm (05/23 1900) Resp:  [10-20] 18 (05/24 0353) BP: (99-122)/(66-88) 106/66 (05/24 0353) SpO2:  [97 %-100 %] 99 % (05/24 0353)     Intake/Output from previous day: 05/23 0701 - 05/24 0700 In: 720 [P.O.:720] Out: 1200 [Urine:1200]   Physical Exam:  Cardiovascular: RRR Pulmonary: Clear to auscultation bilaterally Abdomen: Soft, non tender, bowel sounds present. Wounds: Clean and dry.  No erythema or signs of infection. Chest Tube: to water seal, +1 very intermittent air leak with cough  Lab Results: CBC: No results for input(s): WBC, HGB, HCT, PLT in the last 72 hours. BMET:  No results for input(s): NA, K, CL, CO2, GLUCOSE, BUN, CREATININE, CALCIUM in the last 72 hours.  PT/INR: No results for input(s): LABPROT, INR in the last 72 hours. ABG:  INR: Will add last result for INR, ABG once components are confirmed Will add last 4 CBG results once components are confirmed  Assessment/Plan:  1. CV - SR 2.  Pulmonary - Patient failed clamping chest tube over the weekend (increased pneumothorax). Chest tube with scant output last 24 hours. Chest tube is to water seal and there is a +1 very intermittent air leak with cough. CXR this am shows trace right apical pneumothorax. As discussed with Dr. Cliffton Asters, will try another clamping trial today and re check CXR in about 4 hours. Encourage incentive  spirometer.   Donielle M ZimmermanPA-C 11/21/2020,7:13 AM [phone]  Another failed clamping trial Will plan for IBV placement  Elysse Polidore O Verlisa Vara

## 2020-11-21 NOTE — Progress Notes (Signed)
PROGRESS NOTE    Ronald Butler  XKG:[id number] DOB: January 24, 1976 DOA: 10/28/2020 PCP: Patient, No Pcp Per (Inactive)   Brief Narrative: The patient is a 45 year old Caucasian male with a past medical history significant for but not limited to recurrent spontaneous pneumothorax previously refused VATS, polysubstance abuse, seizure disorder as well as other comorbidities who was apprehended by law enforcement and incarcerated and subsequently developed chest pain and shortness of breath and was found to have a right sided pneumothorax.  A chest tube was placed and he was transferred to [name] for cardiothoracic surgery evaluation from [facility].  He subsequently underwent VATS with wedge resection on 10/30/2020. He was also found to be positive for COVID-19 incidentally.  On 11/02/2020 his chest tube was clamped and pneumothorax reoccurred with lung collapse.  Continues to have his chest tube to waterseal and an air leak so cardiothoracic surgery recommending continuing chest tube.  Repeat CXR on 11/16/20 shows "Right chest tube in stable position. Persistent right base pneumothorax." Patient underwent a CT Chest to check for any further bullous disease and it showed "Small residual right basilar pneumothorax also with incomplete re-expansion of the superior segment of the right lower lobe despite well-positioned thoracostomy tube. Evidence of prior surgical resection of pulmonary blebs at the right lung apex and superior segment of the right lower lobe. Three small blebs present along the anterior aspect of the right lung apex. Mild centrilobular pulmonary emphysema."\  Clamping trial attempted 11/17/2020 but resulted in an expanding basilar pneumothorax.  CT surgery now recommending leaving Chest tube to waterseal through the weekend and repeated chest x-Toney Monday which continues to show trace Right Apical Pneumothorax. CT Surgery recommending leaving to water another clamping trial again  today. They continue to encourage Incentive Spirometry.  He continues to have some nausea and abdominal pain still he was resumed on IV fluids and lab work was obtained.  We will need to continue to treat his pain and if his abdominal pain and nausea worsened we will obtain further imaging.  Assessment & Plan:   Active Problems:   Pneumothorax  Right Sided Pneumothorax s/p Wedge Resection on 10/30/20 -CT Surgery following and appreciate care and further management from them -The chest tube is to waterseal and there is a 1+ to 2 air leak with cough and the CXR this AM showed "Stable small right pneumothorax. Stable position of the right chest tube. No focal lung disease." -CT Surgery attempted Clamping Trial 11/17/20 and chest x-Latwan repeated and showed that the right basilar pneumothorax had expanded with a clamp trial so they placed the chest tube back to waterseal attempted clamp trial on 5/20 resulting in a expanded basilar pneumothorax he will remain on waterseal through the weekend with trace residual pneumothorax laterally per CT surgery recommendations and after re-evaluation again the day before yesterday they recommending leaving to [facility] again yesterday and attempting another clamping trial 11/21/20 and this is currently being done -Pnuemothorax improved but CT Surgery states to keep today given his airleak with cough and trace right apical pneumothorax -Cardiothoracic surgery recommending checking a CT of the chest on 11/16/20  and showed "Small residual right basilar pneumothorax also with incomplete re-expansion of the superior segment of the right lower lobe despite well-positioned thoracostomy tube. Evidence of prior surgical resection of pulmonary blebs at the right lung apex and superior segment of the right lower lobe. Three small blebs present along the anterior aspect of the right lung apex. Mild centrilobular pulmonary emphysema." -Will  need to repeat chest x-Beecher in a.m. and  recommending continue incentive spirometer -PT/OT recommending Supervision and Rolling Walker with 5" Wheels  -He appears comfortable on examination and will Continue with pain control and patient has oxycodone IR 10 mg every 6 as needed for moderate pain and hydromorphone 1 mg IV every 4 as needed severe pain as well as p.o. acetaminophen 1000 mg every 8 hours scheduled -He has been suspected to have Narcotic Seeking behavior -C/w Incentive Spirometry  -Continue with bowel regimen with bisacodyl 10 mg p.o. daily and with MiraLAX 17 g p.o. daily in addition to senna docusate 1 tab p.o. nightly -Further Care per TCTS  Nausea and Vomiting -Possibly in the setting of Pain -Start on IV fluid hydration with normal saline at 75 MLS per hour -Checked a lipase level which was normal -If he has continued abdominal pain and nausea vomiting will obtain a CT of the abdomen pelvis -Continue supportive care and with antiemetics  COVID-19 -Incidental finding and Unclear how long he has had this -Observing for now -Off of Isolation -Tested Positive 10/28/20 -Last 3 PCT's have been Negative at <0.10  -D-Dimer trended up and went from 0.85 -> 3.15 today 0.55 -CRP went from 3.0 -> 1.6 -> 4.6 and is now 0.5 No results for input(s): DDIMER, FERRITIN, LDH, CRP in the last 72 hours.  Lab Results  Component Value Date   SARSCOV2NAA POSITIVE (A) 10/28/2020   SARSCOV2NAA NEGATIVE 04/26/2020   SARSCOV2NAA NEGATIVE 04/18/2020  -SpO2: 97 % O2 Flow Rate (L/min): 2 L/min -Wean O2  -Refused sq Lovenox so was switched to Apixaban 2.5 mg po BID   Seizure Disorder -C/w Antiepileptics with divalproex thousand once p.o. daily as well as 750 mg p.o. nightly and with Lacosamide 100 g p.o. twice daily and with Zonisamide 300 mg p.o. nightly  Tobacco Abuse -Smoking cessation counseling given  -Reefusing a nicotine patch  Polystubstance Use -UDS was Positive for Cocaine and Opiates on Admission  -Counseling was  given   Hypotension -BP remain softer -Last BP was 97/71 -Continue to Monitor and Trend -Repeat BP per Protocol   Normocytic Anemia  -Stable.  Patient's hemoglobin/hematocrit is now 11.2/33.8 on last check (11/18/20) -Checked Anemia Panel as he is anticoagulated with apixaban 2.5 mils p.o. twice daily and it showed an iron level of 66, U IBC of 403, TIBC 469, saturation ratios of 14%, ferritin of 133 and folate of 4.5 as well as vitamin B12 level 356 -Continue to Monitor for S/Sx of Bleeding; No overt bleeding noted -Repeat CBC intermittently   Hyperglycemia -Mild and Reactively likely -Blood Sugars have been ranging from 93-137 on Daily CMP's -Continue to Monitor and Trend intermittently -Last HbA1c was 5.5 in October 2021  DVT prophylaxis: Anticoagulated with Eliquis 2.5 mg p.o. twice daily Code Status: FULL CODE  Family Communication: No family present at bedside  Disposition Plan: Pending further Clinical Improvement and Clearance by CT Surgery and removal of Chest Tube  Status is: Inpatient  Remains inpatient appropriate because:Unsafe d/c plan, IV treatments appropriate due to intensity of illness or inability to take PO and Inpatient level of care appropriate due to severity of illness   Dispo: The [facility] is from: [facility]              Anticipated d/c is to: Prison              Patient currently is not medically stable to d/c.   Difficult to place patient No  Consultants:  Cardiothoracic Surgery    Procedures: VATS with Wedge Resection  Antimicrobials:  Anti-infectives (From admission, onward)   Start     Dose/Rate Route Frequency Ordered Stop   10/30/20 2130  ceFAZolin (ANCEF) IVPB 2g/100 mL premix        2 g 200 mL/hr over 30 Minutes Intravenous Every 8 hours 10/30/20 1641 10/31/20 0719        Subjective: Seen and examined at bedside and he looked uncomfortable complaining of some nausea and had some abdominal pain.  No dizziness.  Continues to have some  shortness of breath.  No other concerns or complaints at this time  Objective: Vitals:   11/20/20 2333 11/21/20 0353 11/21/20 0729 11/21/20 1237  BP: 103/68 106/66 107/75 109/75  Pulse: 77 64 68 79  Resp: 16 18 15 11   Temp: 98 F (36.7 C) 98.3 F (36.8 C) 98.4 F (36.9 C) 98.5 F (36.9 C)  TempSrc: Oral Oral Oral Oral  SpO2: 97% 99% 96% 97%  Weight:        Intake/Output Summary (Last 24 hours) at 11/21/2020 1548 Last data filed at 11/21/2020 1300 Gross per 24 hour  Intake 840 ml  Output 1400 ml  Net -560 ml   Filed Weights   10/28/20 0511 11/06/20 0404 11/13/20 0329  Weight: 67.4 kg 67 kg 67.4 kg   Examination: Physical Exam:  Constitutional: WN/WD thin Caucasian male currently in mild distress appears uncomfortable complaining of some pain and nausea Eyes: Lids and conjunctivae normal, sclerae anicteric  ENMT: External Ears, Nose appear normal. Grossly normal hearing.  Neck: Appears normal, supple, no cervical masses, normal ROM, no appreciable thyromegaly; no JVD Respiratory: Diminished to auscultation bilaterally with coarse breath sounds, no wheezing, rales, rhonchi or crackles. Normal respiratory effort and patient is not tachypenic. No accessory muscle use.  Has a right-sided chest tube in place that has now been clamped Cardiovascular: RRR, no murmurs / rubs / gallops. S1 and S2 auscultated. No extremity edema. 2+ pedal pulses. No carotid bruits.  Abdomen: Soft, tender to palpate, non-distended. Bowel sounds positive. GU: Deferred. Musculoskeletal: No clubbing / cyanosis of digits/nails. No joint deformity upper and lower extremities.  Skin: No rashes, lesions, ulcers on limited skin evaluation. No induration; Warm and dry.  Neurologic: CN 2-12 grossly intact with no focal deficits. Romberg sign and cerebellar reflexes not assessed.  Psychiatric: Normal judgment and insight. Alert and oriented x 3. Normal mood and appropriate affect.   Data Reviewed: I have  personally reviewed following labs and imaging studies  CBC: Recent Labs  Lab 11/15/20 0808 11/17/20 0127 11/18/20 0002 11/21/20 0816  WBC 9.0 7.9 6.9 8.1  NEUTROABS 5.8 4.6 4.2 5.5  HGB 11.9* 11.0* 11.2* 13.0  HCT 35.5* 33.4* 33.8* 39.2  MCV 91.0 91.3 90.6 92.9  PLT 291 273 248 244   Basic Metabolic Panel: Recent Labs  Lab 11/15/20 0808 11/17/20 0127 11/18/20 0002 11/21/20 0816  NA 137 136 137 139  K 4.1 3.5 3.9 3.8  CL 107 105 106 105  CO2 24 26 23 24   GLUCOSE 98 105* 93 109*  BUN 18 18 15 13   CREATININE 0.93 0.94 0.93 0.95  CALCIUM 9.0 8.8* 9.0 9.6  MG 2.0 1.9 2.0 1.9  PHOS 4.0 5.1* 4.1 4.0   GFR: Estimated Creatinine Clearance: 95.6 mL/min (by C-G formula based on SCr of 0.95 mg/dL). Liver Function Tests: Recent Labs  Lab 11/15/20 0808 11/17/20 0127 11/18/20 0002 11/21/20 0816  AST 11* 9* 9* 10*  ALT  13 9 11 11   ALKPHOS 69 74 73 66  BILITOT 0.2* 0.3 0.5 0.4  PROT 6.4* 6.1* 6.2* 6.9  ALBUMIN 3.2* 3.1* 3.2* 3.6   Recent Labs  Lab 11/21/20 0816  LIPASE 25   No results for input(s): AMMONIA in the last 168 hours. Coagulation Profile: No results for input(s): INR, PROTIME in the last 168 hours. Cardiac Enzymes: No results for input(s): CKTOTAL, CKMB, CKMBINDEX, TROPONINI in the last 168 hours. BNP (last 3 results) No results for input(s): PROBNP in the last 8760 hours. HbA1C: No results for input(s): HGBA1C in the last 72 hours. CBG: No results for input(s): GLUCAP in the last 168 hours. Lipid Profile: No results for input(s): CHOL, HDL, LDLCALC, TRIG, CHOLHDL, LDLDIRECT in the last 72 hours. Thyroid Function Tests: No results for input(s): TSH, T4TOTAL, FREET4, T3FREE, THYROIDAB in the last 72 hours. Anemia Panel: No results for input(s): VITAMINB12, FOLATE, FERRITIN, TIBC, IRON, RETICCTPCT in the last 72 hours. Sepsis Labs: No results for input(s): PROCALCITON, LATICACIDVEN in the last 168 hours.  No results found for this or any previous  visit (from the past 240 hour(s)).   RN Pressure Injury Documentation:     Estimated body mass index is 20.72 kg/m as calculated from the following:   Height as of 06/19/20: 5\' 11"  (1.803 m).   Weight as of this encounter: 67.4 kg.  Malnutrition Type:   Malnutrition Characteristics:    Nutrition Interventions:   Radiology Studies: DG CHEST PORT 1 VIEW  Result Date: 11/21/2020 CLINICAL DATA:  Pneumothorax.  Chest tube. EXAM: PORTABLE CHEST 1 VIEW COMPARISON:  11/20/2020. FINDINGS: Right chest tube in stable position. Stable tiny right-sided pneumothorax again noted. Left costophrenic angle incompletely imaged. Lungs are clear. No pleural effusion identified. Heart size normal. No acute bony abnormality. Prior cervical spine fusion. IMPRESSION: Right chest tube in stable position. Stable tiny right-sided pneumothorax again noted. Chest is unchanged from prior exam. Electronically Signed   By: 11/23/2020  Register   On: 11/21/2020 07:05   DG CHEST PORT 1 VIEW  Result Date: 11/20/2020 CLINICAL DATA:  Pneumothorax and chest tube. EXAM: PORTABLE CHEST 1 VIEW COMPARISON:  11/18/2020. FINDINGS: Stable position of the right chest tube. Again noted is a very small lateral right pneumothorax. Left lung remains clear. Negative for left pneumothorax. Heart and mediastinum are stable. Trachea is midline. Partially visualized surgical hardware in the lower cervical spine. IMPRESSION: Stable small right pneumothorax. Stable position of the right chest tube. No focal lung disease. Electronically Signed   By: 11/22/2020 M.D.   On: 11/20/2020 08:39   DG Chest Port 1V same Day  Result Date: 11/21/2020 CLINICAL DATA:  Right pneumothorax.  Chest tube clamped. EXAM: PORTABLE CHEST 1 VIEW COMPARISON:  Chest x-Julian 11/21/2020. FINDINGS: Right chest tube in stable position. Progressive right base pneumothorax. Postsurgical changes right lung. Atelectatic changes right lung base. No pleural effusion. Heart size normal.  Thoracic spine scoliosis. Postsurgical changes cervical spine. IMPRESSION: Right chest tube in stable position. Progressive prominent right base pneumothorax. Consider unclamping right chest tube. Critical Value/emergent results were called by telephone at the time of interpretation on 11/21/2020 at 1:51 pm to nurse 11/23/2020, who verbally acknowledged these results. Electronically Signed   By: 11/23/2020  Register   On: 11/21/2020 13:53   Scheduled Meds: . acetaminophen  1,000 mg Oral Q8H  . apixaban  2.5 mg Oral BID  . bisacodyl  10 mg Oral Daily  . divalproex  500 mg Oral Daily  . divalproex  750 mg Oral QHS  . feeding supplement  237 mL Oral BID BM  . lacosamide  100 mg Oral BID  . polyethylene glycol  17 g Oral Daily  . senna-docusate  1 tablet Oral QHS  . zonisamide  300 mg Oral QHS   Continuous Infusions:   LOS: 24 days   Merlene Laughter, DO [facility] PAGER is on AMION  If 7PM-7AM, please contact night-coverage [email or web address]

## 2020-11-22 ENCOUNTER — Inpatient Hospital Stay (HOSPITAL_COMMUNITY): Payer: Self-pay

## 2020-11-22 ENCOUNTER — Encounter (HOSPITAL_COMMUNITY): Payer: Self-pay | Admitting: Anesthesiology

## 2020-11-22 DIAGNOSIS — R569 Unspecified convulsions: Secondary | ICD-10-CM

## 2020-11-22 MED ORDER — MORPHINE SULFATE (PF) 2 MG/ML IV SOLN: 2.0000 mg | Freq: Three times a day (TID) | INTRAVENOUS | Status: DC | PRN

## 2020-11-22 MED ORDER — HYDROMORPHONE HCL 2 MG PO TABS
4.0000 mg | ORAL_TABLET | ORAL | Status: DC | PRN
  Administered 2020-11-22 – 2020-11-29 (×36): 4 mg via ORAL
  Filled 2020-11-22 (×36): qty 2

## 2020-11-22 MED ORDER — MORPHINE SULFATE (PF) 2 MG/ML IV SOLN
2.0000 mg | Freq: Four times a day (QID) | INTRAVENOUS | Status: DC | PRN
  Administered 2020-11-22 – 2020-11-23 (×2): 2 mg via INTRAVENOUS
  Filled 2020-11-22 (×2): qty 1

## 2020-11-22 NOTE — Progress Notes (Addendum)
PROGRESS NOTE    Ronald Butler  BMW:[id number] DOB: 13-Apr-1976 DOA: 10/28/2020 PCP: Patient, No Pcp Per (Inactive)   Brief Narrative: The patient is a 45 year old Caucasian male with a past medical history significant for but not limited to recurrent spontaneous pneumothorax previously refused VATS, polysubstance abuse, seizure disorder as well as other comorbidities who was apprehended by law enforcement and incarcerated and subsequently developed chest pain and shortness of breath and was found to have a right sided pneumothorax.  A chest tube was placed and he was transferred to [name] for cardiothoracic surgery evaluation from [facility].  He subsequently underwent VATS with wedge resection on 10/30/2020. He was also found to be positive for COVID-19 incidentally.  On 11/02/2020 his chest tube was clamped and pneumothorax reoccurred with lung collapse.  Continues to have his chest tube to waterseal and an air leak so cardiothoracic surgery recommending continuing chest tube.  Repeat CXR on 11/16/20 shows "Right chest tube in stable position. Persistent right base pneumothorax." Patient underwent a CT Chest to check for any further bullous disease and it showed "Small residual right basilar pneumothorax also with incomplete re-expansion of the superior segment of the right lower lobe despite well-positioned thoracostomy tube. Evidence of prior surgical resection of pulmonary blebs at the right lung apex and superior segment of the right lower lobe. Three small blebs present along the anterior aspect of the right lung apex. Mild centrilobular pulmonary emphysema."\  Clamping trial attempted 11/17/2020 but resulted in an expanding basilar pneumothorax.  CT surgery now recommending leaving Chest tube to waterseal through the weekend and repeated chest x-[facility] Monday which continues to show trace Right Apical Pneumothorax. CT Surgery recommending leaving to water another clamping trial again  today. They continue to encourage Incentive Spirometry.  He continues to have some nausea and abdominal pain still he was resumed on IV fluids and lab work was obtained.  We will need to continue to treat his pain and if his abdominal pain and nausea worsened we will obtain further imaging.  Assessment & Plan:   Active Problems:   Pneumothorax  Right Sided Pneumothorax s/p multiple Wedge bleb Resections on 10/30/20 Persistent air leak -CT Surgery following and appreciate care and further management from them -The chest tube is to waterseal and there is a 1+ to 2 air leak with cough and the CXR this AM showed "Stable small right pneumothorax. Stable position of the right chest tube. No focal lung disease." -CT Surgery attempted Clamping Trial 11/17/20 and chest x-[facility] repeated and showed that the right basilar pneumothorax had expanded with a clamp trial so they placed the chest tube back to waterseal attempted clamp trial on 5/20 resulting in a expanded basilar pneumothorax he will remain on waterseal through the weekend with trace residual pneumothorax laterally per CT surgery recommendations and after re-evaluation again the day before yesterday they recommending leaving to [facility] again yesterday and attempting another clamping trial 11/21/20 and this is currently being done -Pnuemothorax improved but CT Surgery states to keep today given his airleak with cough and trace right apical pneumothorax -Cardiothoracic surgery recommending checking a CT of the chest on 11/16/20  and showed "Small residual right basilar pneumothorax also with incomplete re-expansion of the superior segment of the right lower lobe despite well-positioned thoracostomy tube. Evidence of prior surgical resection of pulmonary blebs at the right lung apex and superior segment of the right lower lobe. Three small blebs present along the anterior aspect of the right lung apex.  Mild centrilobular pulmonary emphysema." Plan: --CT surg  planning for bronch for placement of intrabronchial valve to control the air leak. --chest tube management per CT surg  Chest tube pain --has been receiving IV dilaudid q4h for the past 25 days. Plan: --d/c IV dilaudid.  Do not order IV dilaudid. --start oral dilaudid 4 mg q4h PRN --IV morphine 2 mg q6h PRN  Nausea and Vomiting -Possibly in the setting of Pain.  Currently tolerating regular diet. --d/c MIVF --zofran PRN  COVID-19 -Incidental finding and Unclear how long he has had this -Observing for now -Off of Isolation -Tested Positive 10/28/20  Seizure disorder --cont Depakote, Vimpat and zonisamide  History of tobacco abuse -Counseled on cessation -Nicotine patch refused  Normocytic Anemia  Folate def -Stable.  Patient's hemoglobin/hematocrit is now 11.2/33.8 on last check (11/18/20) -Checked Anemia Panel as he is anticoagulated with apixaban 2.5 mils p.o. twice daily and it showed an iron level of 66, U IBC of 403, TIBC 469, saturation ratios of 14%, ferritin of 133 and folate of 4.5 as well as vitamin B12 level 356 - No overt bleeding noted Plan: --start folic acid supplement   DVT prophylaxis: Anticoagulated with Eliquis 2.5 mg p.o. twice daily Code Status: FULL CODE  Family Communication:  Disposition Plan: undetermined  Status is: Inpatient   Dispo: The patient is from: Prison              Anticipated d/c is to: Prison              Patient currently is not medically stable to d/c.  Still has chest tube with air leak.    Difficult to place patient No  Consultants:   Cardiothoracic Surgery    Procedures: VATS with Wedge Resection  Antimicrobials:  Anti-infectives (From admission, onward)   Start     Dose/Rate Route Frequency Ordered Stop   10/30/20 2130  ceFAZolin (ANCEF) IVPB 2g/100 mL premix        2 g 200 mL/hr over 30 Minutes Intravenous Every 8 hours 10/30/20 1641 10/31/20 0719        Subjective: Pt complained of pain.  Eating ok.    Transitioned pt off of IV dilaudid to oral dilaudid.    Persistent air leak.  CT surg planning for bronch for placement of intrabronchial valve to control the air leak.   In the afternoon, pt complained to RN about "wanting to end it all", but later denied SI, but just depressed about his condition not improving.   Objective: Vitals:   11/21/20 2353 11/22/20 0424 11/22/20 0728 11/22/20 1158  BP: 101/70 107/87 119/86 100/80  Pulse: 65 84 95 71  Resp: 20 20 18 14   Temp: 97.7 F (36.5 C) 98 F (36.7 C)    TempSrc: Oral Oral    SpO2: 100% 97% 95% 98%  Weight:        Intake/Output Summary (Last 24 hours) at 11/22/2020 1514 Last data filed at 11/22/2020 0825 Gross per 24 hour  Intake 240 ml  Output --  Net 240 ml   Filed Weights   10/28/20 0511 11/06/20 0404 11/13/20 0329  Weight: 67.4 kg 67 kg 67.4 kg    Physical Exam: Constitutional: NAD, AAOx3 HEENT: conjunctivae and lids normal, EOMI CV: No cyanosis.   RESP: normal respiratory effort, on RA Extremities: No effusions, edema in BLE SKIN: warm, dry Neuro: II - XII grossly intact.   Psych: depressed mood and affect.     Data Reviewed: I have personally reviewed  following labs and imaging studies  CBC: Recent Labs  Lab 11/17/20 0127 11/18/20 0002 11/21/20 0816  WBC 7.9 6.9 8.1  NEUTROABS 4.6 4.2 5.5  HGB 11.0* 11.2* 13.0  HCT 33.4* 33.8* 39.2  MCV 91.3 90.6 92.9  PLT 273 248 244   Basic Metabolic Panel: Recent Labs  Lab 11/17/20 0127 11/18/20 0002 11/21/20 0816  NA 136 137 139  K 3.5 3.9 3.8  CL 105 106 105  CO2 26 23 24   GLUCOSE 105* 93 109*  BUN 18 15 13   CREATININE 0.94 0.93 0.95  CALCIUM 8.8* 9.0 9.6  MG 1.9 2.0 1.9  PHOS 5.1* 4.1 4.0   GFR: Estimated Creatinine Clearance: 95.6 mL/min (by C-G formula based on SCr of 0.95 mg/dL). Liver Function Tests: Recent Labs  Lab 11/17/20 0127 11/18/20 0002 11/21/20 0816  AST 9* 9* 10*  ALT 9 11 11   ALKPHOS 74 73 66  BILITOT 0.3 0.5 0.4  PROT  6.1* 6.2* 6.9  ALBUMIN 3.1* 3.2* 3.6   Recent Labs  Lab 11/21/20 0816  LIPASE 25   No results for input(s): AMMONIA in the last 168 hours. Coagulation Profile: No results for input(s): INR, PROTIME in the last 168 hours. Cardiac Enzymes: No results for input(s): CKTOTAL, CKMB, CKMBINDEX, TROPONINI in the last 168 hours. BNP (last 3 results) No results for input(s): PROBNP in the last 8760 hours. HbA1C: No results for input(s): HGBA1C in the last 72 hours. CBG: No results for input(s): GLUCAP in the last 168 hours. Lipid Profile: No results for input(s): CHOL, HDL, LDLCALC, TRIG, CHOLHDL, LDLDIRECT in the last 72 hours. Thyroid Function Tests: No results for input(s): TSH, T4TOTAL, FREET4, T3FREE, THYROIDAB in the last 72 hours. Anemia Panel: No results for input(s): VITAMINB12, FOLATE, FERRITIN, TIBC, IRON, RETICCTPCT in the last 72 hours. Sepsis Labs: No results for input(s): PROCALCITON, LATICACIDVEN in the last 168 hours.  No results found for this or any previous visit (from the past 240 hour(s)).   RN Pressure Injury Documentation:     Estimated body mass index is 20.72 kg/m as calculated from the following:   Height as of 06/19/20: 5\' 11"  (1.803 m).   Weight as of this encounter: 67.4 kg.  Malnutrition Type:   Malnutrition Characteristics:    Nutrition Interventions:   Radiology Studies: DG CHEST PORT 1 VIEW  Result Date: 11/22/2020 CLINICAL DATA:  Pneumothorax.  Chest tube. EXAM: PORTABLE CHEST 1 VIEW COMPARISON:  CT chest 11/16/2020, chest x-[facility] 11/21/2020 FINDINGS: Right chest tube in similar position. The heart size and mediastinal contours are within normal limits. No focal consolidation. No pulmonary edema. No pleural effusion. Interval decrease in now trace right pneumothorax. Trace right apical pleural fluid again noted. No left pneumothorax. No acute osseous abnormality. IMPRESSION: Interval increase in now trace right pneumothorax with a right chest  tube in similar position. Electronically Signed   By: Tish FredericksonMorgane  Naveau M.D.   On: 11/22/2020 06:46   DG CHEST PORT 1 VIEW  Result Date: 11/21/2020 CLINICAL DATA:  Pneumothorax.  Chest tube. EXAM: PORTABLE CHEST 1 VIEW COMPARISON:  11/20/2020. FINDINGS: Right chest tube in stable position. Stable tiny right-sided pneumothorax again noted. Left costophrenic angle incompletely imaged. Lungs are clear. No pleural effusion identified. Heart size normal. No acute bony abnormality. Prior cervical spine fusion. IMPRESSION: Right chest tube in stable position. Stable tiny right-sided pneumothorax again noted. Chest is unchanged from prior exam. Electronically Signed   By: Maisie Fushomas  Register   On: 11/21/2020 07:05   DG  Chest Port 1V same Day  Result Date: 11/21/2020 CLINICAL DATA:  Right pneumothorax.  Chest tube clamped. EXAM: PORTABLE CHEST 1 VIEW COMPARISON:  Chest x-[facility] 11/21/2020. FINDINGS: Right chest tube in stable position. Progressive right base pneumothorax. Postsurgical changes right lung. Atelectatic changes right lung base. No pleural effusion. Heart size normal. Thoracic spine scoliosis. Postsurgical changes cervical spine. IMPRESSION: Right chest tube in stable position. Progressive prominent right base pneumothorax. Consider unclamping right chest tube. Critical Value/emergent results were called by telephone at the time of interpretation on 11/21/2020 at 1:51 pm to nurse Lurena Joiner, who verbally acknowledged these results. Electronically Signed   By: Maisie Fus  Register   On: 11/21/2020 13:53   Scheduled Meds: . acetaminophen  1,000 mg Oral Q8H  . apixaban  2.5 mg Oral BID  . bisacodyl  10 mg Oral Daily  . divalproex  500 mg Oral Daily  . divalproex  750 mg Oral QHS  . feeding supplement  237 mL Oral BID BM  . lacosamide  100 mg Oral BID  . polyethylene glycol  17 g Oral Daily  . senna-docusate  1 tablet Oral QHS  . zonisamide  300 mg Oral QHS   Continuous Infusions:   LOS: 25 days   Darlin Priestly,  md [facility] PAGER is on AMION  If 7PM-7AM, please contact night-coverage [email or web address]

## 2020-11-22 NOTE — Progress Notes (Addendum)
      [address]       [place] [address]             [phone]       23 Days Post-Op Procedure(s) (LRB): XI ROBOTIC ASSISTED THORASCOPY-WEDGE RESECTION,UPPER LOBE AND LOWER LOBE (Right) INTERCOSTAL NERVE BLOCK (Right) PLEURECTOMY (Right)  Subjective: Still having a lot of soreness at the chest tube insertion site and is reluctant to move much. No change in his breathing.   Objective: Vital signs in last 24 hours: Temp:  [97.7 F (36.5 C)-98.6 F (37 C)] 98 F (36.7 C) (05/25 0424) Pulse Rate:  [65-95] 95 (05/25 0728) Cardiac Rhythm: Normal sinus rhythm (05/24 0907) Resp:  [10-20] 18 (05/25 0728) BP: (101-119)/(69-87) 119/86 (05/25 0728) SpO2:  [95 %-100 %] 95 % (05/25 0728)   Intake/Output from previous day: 05/24 0701 - 05/25 0700 In: 720 [P.O.:720] Out: 550 [Urine:550]  Physical Exam:  Cardiovascular: RRR Pulmonary: breath sounds are clear Wounds: Clean and dry.  No erythema or signs of infection. Chest Tube: to water seal, 1-2+ air leak noted while talking.    Lab Results: CBC: Recent Labs    11/21/20 0816  WBC 8.1  HGB 13.0  HCT 39.2  PLT 244   BMET:  Recent Labs    11/21/20 0816  NA 139  K 3.8  CL 105  CO2 24  GLUCOSE 109*  BUN 13  CREATININE 0.95  CALCIUM 9.6    PT/INR: No results for input(s): LABPROT, INR in the last 72 hours. ABG:  INR: Will add last result for INR, ABG once components are confirmed Will add last 4 CBG results once components are confirmed  Assessment/Plan:  -Persistent air leak POD23 right robotic-assisted thoracoscopy with multiple wedge bleb resections, mechanical pleurodesis and apical pleurectomy. Developed a basilar PTX again yesterday with a clamp trial. Dr. Cliffton Asters planning for bronch for placement of intrabronchial valve to control the air leak. Procedure briefly discussed with Mr. Dunstan this morning.  Scheduling details will follow.     Dale [place] 11/22/2020,7:56  AM [phone]  Pt refused EBV placement I spoke with police department, and [facility]. Paperwork will need to be submitted Hopefully the leak seals before then.  If not, will convert to [employer] for discharge.  Erlene Devita Keane Scrape

## 2020-11-22 NOTE — Plan of Care (Signed)
  Problem: Education: Goal: Knowledge of General Education information will improve Description: Including pain rating scale, medication(s)/side effects and non-pharmacologic comfort measures Outcome: Progressing   Problem: Clinical Measurements: Goal: Ability to maintain clinical measurements within normal limits will improve Outcome: Progressing Goal: Will remain free from infection Outcome: Progressing Goal: Diagnostic test results will improve Outcome: Progressing   Problem: Nutrition: Goal: Adequate nutrition will be maintained Outcome: Progressing   Problem: Coping: Goal: Level of anxiety will decrease Outcome: Progressing   Problem: Pain Managment: Goal: General experience of comfort will improve Outcome: Progressing   Problem: Skin Integrity: Goal: Risk for impaired skin integrity will decrease Outcome: Progressing

## 2020-11-23 ENCOUNTER — Encounter (HOSPITAL_COMMUNITY)
Admission: EM | Disposition: A | Discharge status: Home / Self Care | Payer: Self-pay | Source: Home / Self Care | Attending: Internal Medicine

## 2020-11-23 SURGERY — BRONCHOSCOPY, FLEXIBLE, WITH INTRABRONCHIAL VALVE INSERTION
Anesthesia: General

## 2020-11-23 NOTE — Progress Notes (Signed)
PROGRESS NOTE    Ronald Butler  BMW:[id number] DOB: 13-Apr-1976 DOA: 10/28/2020 PCP: Patient, No Pcp Per (Inactive)   Brief Narrative: The patient is a 45 year old Caucasian male with a past medical history significant for but not limited to recurrent spontaneous pneumothorax previously refused VATS, polysubstance abuse, seizure disorder as well as other comorbidities who was apprehended by law enforcement and incarcerated and subsequently developed chest pain and shortness of breath and was found to have a right sided pneumothorax.  A chest tube was placed and he was transferred to [name] for cardiothoracic surgery evaluation from [facility].  He subsequently underwent VATS with wedge resection on 10/30/2020. He was also found to be positive for COVID-19 incidentally.  On 11/02/2020 his chest tube was clamped and pneumothorax reoccurred with lung collapse.  Continues to have his chest tube to waterseal and an air leak so cardiothoracic surgery recommending continuing chest tube.  Repeat CXR on 11/16/20 shows "Right chest tube in stable position. Persistent right base pneumothorax." Patient underwent a CT Chest to check for any further bullous disease and it showed "Small residual right basilar pneumothorax also with incomplete re-expansion of the superior segment of the right lower lobe despite well-positioned thoracostomy tube. Evidence of prior surgical resection of pulmonary blebs at the right lung apex and superior segment of the right lower lobe. Three small blebs present along the anterior aspect of the right lung apex. Mild centrilobular pulmonary emphysema."\  Clamping trial attempted 11/17/2020 but resulted in an expanding basilar pneumothorax.  CT surgery now recommending leaving Chest tube to waterseal through the weekend and repeated chest x-[facility] Monday which continues to show trace Right Apical Pneumothorax. CT Surgery recommending leaving to water another clamping trial again  today. They continue to encourage Incentive Spirometry.  He continues to have some nausea and abdominal pain still he was resumed on IV fluids and lab work was obtained.  We will need to continue to treat his pain and if his abdominal pain and nausea worsened we will obtain further imaging.  Assessment & Plan:   Active Problems:   Pneumothorax  Right Sided Pneumothorax s/p multiple Wedge bleb Resections on 10/30/20 Persistent air leak -CT Surgery following and appreciate care and further management from them -The chest tube is to waterseal and there is a 1+ to 2 air leak with cough and the CXR this AM showed "Stable small right pneumothorax. Stable position of the right chest tube. No focal lung disease." -CT Surgery attempted Clamping Trial 11/17/20 and chest x-[place] repeated and showed that the right basilar pneumothorax had expanded with a clamp trial so they placed the chest tube back to waterseal attempted clamp trial on 5/20 resulting in a expanded basilar pneumothorax he will remain on waterseal through the weekend with trace residual pneumothorax laterally per CT surgery recommendations and after re-evaluation again the day before yesterday they recommending leaving to [facility] again yesterday and attempting another clamping trial 11/21/20 and this is currently being done -Pnuemothorax improved but CT Surgery states to keep today given his airleak with cough and trace right apical pneumothorax -Cardiothoracic surgery recommending checking a CT of the chest on 11/16/20  and showed "Small residual right basilar pneumothorax also with incomplete re-expansion of the superior segment of the right lower lobe despite well-positioned thoracostomy tube. Evidence of prior surgical resection of pulmonary blebs at the right lung apex and superior segment of the right lower lobe. Three small blebs present along the anterior aspect of the right lung apex.  Mild centrilobular pulmonary emphysema." --Pt refused  procedure for intrabronchial valve placement today. Plan: --CT surg to reschedule intrabronchial valves procedure as able --chest tube management per CT surg  Chest tube pain --has been receiving IV dilaudid q4h for 25 days since presentation, stopped on 5/25. Plan: --Do not order IV dilaudid. --continue oral dilaudid 4 mg q4h PRN --IV morphine 2 mg q6h PRN  Nausea and Vomiting, resolved --Currently tolerating regular diet. --No need for MIVF --zofran PRN  COVID-19 -Incidental finding and Unclear how long he has had this -Observing for now -Off of Isolation -Tested Positive 10/28/20  Seizure disorder --on Depakote, Vimpat and zonisamide --cont current regimen  Current smoker --encouraged cessation today, pt agreeable  Normocytic Anemia  Folate def -Stable.  Patient's hemoglobin/hematocrit is now 11.2/33.8 on last check (11/18/20) -Checked Anemia Panel as he is anticoagulated with apixaban 2.5 mils p.o. twice daily and it showed an iron level of 66, U IBC of 403, TIBC 469, saturation ratios of 14%, ferritin of 133 and folate of 4.5 as well as vitamin B12 level 356 - No overt bleeding noted Plan: --cont folic acid supplement (new)   DVT prophylaxis: Anticoagulated with Eliquis 2.5 mg p.o. twice daily Code Status: FULL CODE  Family Communication:  Disposition Plan: undetermined  Status is: Inpatient   Dispo: The patient is from: Prison              Anticipated d/c is to: Prison              Patient currently is not medically stable to d/c.  Still has chest tube with air leak.    Difficult to place patient No  Consultants:   Cardiothoracic Surgery    Procedures: VATS with Wedge Resection  Antimicrobials:  Anti-infectives (From admission, onward)   Start     Dose/Rate Route Frequency Ordered Stop   10/30/20 2130  ceFAZolin (ANCEF) IVPB 2g/100 mL premix        2 g 200 mL/hr over 30 Minutes Intravenous Every 8 hours 10/30/20 1641 10/31/20 0719         Subjective: Pt refused scheduled procedure for intra bronchial valves today.  CT surg PA discussed with pt and explained the procedure in detail, and pt appeared to be willing to proceed when procedure is re-scheduled.  Eating well.  Pain controlled on current regimen.   Objective: Vitals:   11/23/20 0455 11/23/20 0745 11/23/20 1234 11/23/20 1614  BP: 123/89 112/68 93/68 107/72  Pulse: (!) 107 80 82 93  Resp: 16 16  17   Temp: 98.4 F (36.9 C) 98.2 F (36.8 C)    TempSrc: Oral Oral    SpO2: 99% 98% 98% 98%  Weight:        Intake/Output Summary (Last 24 hours) at 11/23/2020 1749 Last data filed at 11/22/2020 2100 Gross per 24 hour  Intake 240 ml  Output 400 ml  Net -160 ml   Filed Weights   10/28/20 0511 11/06/20 0404 11/13/20 0329  Weight: 67.4 kg 67 kg 67.4 kg    Physical Exam: Constitutional: NAD, AAOx3 HEENT: conjunctivae and lids normal, EOMI CV: No cyanosis.   RESP: normal respiratory effort, on RA, chest tube present Neuro: II - XII grossly intact.   Psych: anxious mood and affect.     Data Reviewed: I have personally reviewed following labs and imaging studies  CBC: Recent Labs  Lab 11/17/20 0127 11/18/20 0002 11/21/20 0816  WBC 7.9 6.9 8.1  NEUTROABS 4.6 4.2 5.5  HGB 11.0*  11.2* 13.0  HCT 33.4* 33.8* 39.2  MCV 91.3 90.6 92.9  PLT 273 248 244   Basic Metabolic Panel: Recent Labs  Lab 11/17/20 0127 11/18/20 0002 11/21/20 0816  NA 136 137 139  K 3.5 3.9 3.8  CL 105 106 105  CO2 26 23 24   GLUCOSE 105* 93 109*  BUN 18 15 13   CREATININE 0.94 0.93 0.95  CALCIUM 8.8* 9.0 9.6  MG 1.9 2.0 1.9  PHOS 5.1* 4.1 4.0   GFR: Estimated Creatinine Clearance: 95.6 mL/min (by C-G formula based on SCr of 0.95 mg/dL). Liver Function Tests: Recent Labs  Lab 11/17/20 0127 11/18/20 0002 11/21/20 0816  AST 9* 9* 10*  ALT 9 11 11   ALKPHOS 74 73 66  BILITOT 0.3 0.5 0.4  PROT 6.1* 6.2* 6.9  ALBUMIN 3.1* 3.2* 3.6   Recent Labs  Lab 11/21/20 0816   LIPASE 25   No results for input(s): AMMONIA in the last 168 hours. Coagulation Profile: No results for input(s): INR, PROTIME in the last 168 hours. Cardiac Enzymes: No results for input(s): CKTOTAL, CKMB, CKMBINDEX, TROPONINI in the last 168 hours. BNP (last 3 results) No results for input(s): PROBNP in the last 8760 hours. HbA1C: No results for input(s): HGBA1C in the last 72 hours. CBG: No results for input(s): GLUCAP in the last 168 hours. Lipid Profile: No results for input(s): CHOL, HDL, LDLCALC, TRIG, CHOLHDL, LDLDIRECT in the last 72 hours. Thyroid Function Tests: No results for input(s): TSH, T4TOTAL, FREET4, T3FREE, THYROIDAB in the last 72 hours. Anemia Panel: No results for input(s): VITAMINB12, FOLATE, FERRITIN, TIBC, IRON, RETICCTPCT in the last 72 hours. Sepsis Labs: No results for input(s): PROCALCITON, LATICACIDVEN in the last 168 hours.  No results found for this or any previous visit (from the past 240 hour(s)).   RN Pressure Injury Documentation:     Estimated body mass index is 20.72 kg/m as calculated from the following:   Height as of 06/19/20: 5\' 11"  (1.803 m).   Weight as of this encounter: 67.4 kg.  Malnutrition Type:   Malnutrition Characteristics:    Nutrition Interventions:   Radiology Studies: DG CHEST PORT 1 VIEW  Result Date: 11/22/2020 CLINICAL DATA:  Pneumothorax.  Chest tube. EXAM: PORTABLE CHEST 1 VIEW COMPARISON:  CT chest 11/16/2020, chest x-[place] 11/21/2020 FINDINGS: Right chest tube in similar position. The heart size and mediastinal contours are within normal limits. No focal consolidation. No pulmonary edema. No pleural effusion. Interval decrease in now trace right pneumothorax. Trace right apical pleural fluid again noted. No left pneumothorax. No acute osseous abnormality. IMPRESSION: Interval increase in now trace right pneumothorax with a right chest tube in similar position. Electronically Signed   By: M.D.    On: 11/22/2020 06:46   Scheduled Meds: . acetaminophen  1,000 mg Oral Q8H  . apixaban  2.5 mg Oral BID  . bisacodyl  10 mg Oral Daily  . divalproex  500 mg Oral Daily  . divalproex  750 mg Oral QHS  . feeding supplement  237 mL Oral BID BM  . lacosamide  100 mg Oral BID  . polyethylene glycol  17 g Oral Daily  . senna-docusate  1 tablet Oral QHS  . zonisamide  300 mg Oral QHS   Continuous Infusions:   LOS: 26 days   11/18/2020, md [facility] PAGER is on AMION  If 7PM-7AM, please contact night-coverage [email or web address]

## 2020-11-23 NOTE — Progress Notes (Signed)
PT Cancellation Note  Patient Details Name: Ronald Butler MRN: [id number] DOB: 06/02/1976   Cancelled Treatment:    Reason Eval/Treat Not Completed: Patient declined, no reason specified attempted to work with patient- he politely refuses therapy today due to ongoing pain from chest tube. Confirms that he is walking to the bathroom with nursing a couple of times a day. Discussed that it has been hard for PT to progress him due to pain levels, and he requests that we hold off until chest tube is out and he feels up to doing more. Due to multiple cancels/per patient request, dropping to 1x/week. Will plan to re-increase frequency once chest tube is removed and he can hopefully tolerate more and progress with PT.    Lerry Liner PT, DPT, PN1   Supplemental Physical Therapist [facility]    Pager [phone] Acute Rehab Office [phone]

## 2020-11-23 NOTE — Progress Notes (Deleted)
As discussed with Dr. Cliffton Asters, patient does not want further surgery (intra bronchial valves). Chest tube is to be placed to a mini express. Once this is done, ok from a surgical perspective to discharge with chest tube to mini express. Appointment to be arranged in office in about 2 weeks

## 2020-11-24 MED ORDER — HYDROMORPHONE HCL 1 MG/ML IJ SOLN: 1.0000 mg | INTRAMUSCULAR | Status: DC | PRN

## 2020-11-24 NOTE — Progress Notes (Addendum)
      [address]       [place] [address]             [phone]       25 Days Post-Op Procedure(s) (LRB): XI ROBOTIC ASSISTED THORASCOPY-WEDGE RESECTION,UPPER LOBE AND LOWER LOBE (Right) INTERCOSTAL NERVE BLOCK (Right) PLEURECTOMY (Right)  Subjective: Patient requesting pain medicaiton again this am;he states for whatever reason, Morphine and Fentanyl do not work but Diluadid does help.  Objective: Vital signs in last 24 hours: Temp:  [98.2 F (36.8 C)-99.2 F (37.3 C)] 98.3 F (36.8 C) (05/27 0459) Pulse Rate:  [80-100] 85 (05/27 0459) Cardiac Rhythm: Sinus tachycardia (05/26 1907) Resp:  [16-19] 19 (05/27 0459) BP: (93-116)/(68-87) 116/87 (05/27 0459) SpO2:  [97 %-98 %] 97 % (05/27 0459)     Intake/Output from previous day: 05/26 0701 - 05/27 0700 In: 600 [P.O.:600] Out: 0    Physical Exam:  Cardiovascular: RRR Pulmonary: Clear to auscultation bilaterally Abdomen: Soft, non tender, bowel sounds present. Wounds: Clean and dry.  No erythema or signs of infection. Chest Tube: to water seal, ++ intermittent air leak with cough  Lab Results: CBC: Recent Labs    11/21/20 0816  WBC 8.1  HGB 13.0  HCT 39.2  PLT 244   BMET:  Recent Labs    11/21/20 0816  NA 139  K 3.8  CL 105  CO2 24  GLUCOSE 109*  BUN 13  CREATININE 0.95  CALCIUM 9.6    PT/INR: No results for input(s): LABPROT, INR in the last 72 hours. ABG:  INR: Will add last result for INR, ABG once components are confirmed Will add last 4 CBG results once components are confirmed  Assessment/Plan:  1. CV - SR 2.  Pulmonary - Patient failed clamping chest tube over the weekend (increased pneumothorax). Chest tube with scant output last 24 hours. Chest tube is to water seal and there is a ++ intermittent air leak with cough. No CXR so will order for am. Will check in am.Encourage incentive spirometer. 3. Will stop Morphine PRN and give Dilaudid PRN  Sondra Blixt M  ZimmermanPA-C 11/24/2020,7:10 AM [phone]

## 2020-11-24 NOTE — Progress Notes (Signed)
PROGRESS NOTE    Ronald Butler  ZOX:[id number] DOB: 11-Jan-1976 DOA: 10/28/2020 PCP: Patient, No Pcp Per (Inactive)   Brief Narrative: The patient is a 45 year old Caucasian male with a past medical history significant for but not limited to recurrent spontaneous pneumothorax previously refused VATS, polysubstance abuse, seizure disorder as well as other comorbidities who was apprehended by law enforcement and incarcerated and subsequently developed chest pain and shortness of breath and was found to have a right sided pneumothorax.  A chest tube was placed and he was transferred to [name] for cardi[facility]acic surgery evaluation from [facility].  He subsequently underwent VATS with wedge resection on 10/30/2020. He was also found to be positive for COVID-19 incidentally.  On 11/02/2020 his chest tube was clamped and pneumothorax reoccurred with lung collapse.  Continues to have his chest tube to waterseal and an air leak so cardiothoracic surgery recommending continuing chest tube.  Repeat CXR on 11/16/20 shows "Right chest tube in stable position. Persistent right base pneumothorax." Patient underwent a CT Chest to check for any further bullous disease and it showed "Small residual right basilar pneumothorax also with incomplete re-expansion of the superior segment of the right lower lobe despite well-positioned thoracostomy tube. Evidence of prior surgical resection of pulmonary blebs at the right lung apex and superior segment of the right lower lobe. Three small blebs present along the anterior aspect of the right lung apex. Mild centrilobular pulmonary emphysema."\  Clamping trial attempted 11/17/2020 but resulted in an expanding basilar pneumothorax.  CT surgery now recommending leaving Chest tube to waterseal through the weekend and repeated chest x-[place] Monday which continues to show trace Right Apical Pneumothorax. CT Surgery recommending leaving to water another clamping trial again  today. They continue to encourage Incentive Spirometry.  He continues to have some nausea and abdominal pain still he was resumed on IV fluids and lab work was obtained.  We will need to continue to treat his pain and if his abdominal pain and nausea worsened we will obtain further imaging.  Assessment & Plan:   Active Problems:   Pneumothorax  Right Sided Pneumothorax s/p multiple Wedge bleb Resections on 10/30/20 Persistent air leak -CT Surgery following and appreciate care and further management from them -The chest tube is to waterseal and there is a 1+ to 2 air leak with cough and the CXR this AM showed "Stable small right pneumothorax. Stable position of the right chest tube. No focal lung disease." -CT Surgery attempted Clamping Trial 11/17/20 and chest x-[facility] repeated and showed that the right basilar pneumothorax had expanded with a clamp trial so they placed the chest tube back to waterseal attempted clamp trial on 5/20 resulting in a expanded basilar pneumothorax he will remain on waterseal through the weekend with trace residual pneumothorax laterally per CT surgery recommendations and after re-evaluation again the day before yesterday they recommending leaving to [facility] again yesterday and attempting another clamping trial 11/21/20 and this is currently being done -Cardiothoracic surgery recommending checking a CT of the chest on 11/16/20  and showed "Small residual right basilar pneumothorax also with incomplete re-expansion of the superior segment of the right lower lobe despite well-positioned thoracostomy tube. Evidence of prior surgical resection of pulmonary blebs at the right lung apex and superior segment of the right lower lobe. Three small blebs present along the anterior aspect of the right lung apex. Mild centrilobular pulmonary emphysema." --Pt refused procedure for intrabronchial valve placement scheduled for 5/26. Plan: --CT surg to reschedule  intrabronchial valves procedure  as able --chest tube management per CT surg  Chest tube pain --has been receiving IV dilaudid q4h for 25 days since presentation, stopped on 5/25. Plan: --Do not order IV dilaudid. --continue oral dilaudid 4 mg q4h PRN  Nausea and Vomiting, resolved --Currently tolerating regular diet. --No need for MIVF --zofran PRN  COVID-19 -Incidental finding and Unclear how long he has had this -Observing for now -Off of Isolation -Tested Positive 10/28/20  Seizure disorder --on Depakote, Vimpat and zonisamide --cont current regimen  Current smoker --encouraged cessation on 5/26, pt agreeable  Normocytic Anemia  Folate def -Stable.  Patient's hemoglobin/hematocrit is now 11.2/33.8 on last check (11/18/20) -Checked Anemia Panel as he is anticoagulated with apixaban 2.5 mils p.o. twice daily and it showed an iron level of 66, U IBC of 403, TIBC 469, saturation ratios of 14%, ferritin of 133 and folate of 4.5 as well as vitamin B12 level 356 - No overt bleeding noted Plan: --cont folic acid supplement (new)   DVT prophylaxis: Anticoagulated with Eliquis 2.5 mg p.o. twice daily Code Status: FULL CODE  Family Communication:   Status is: Inpatient   Dispo: The patient is from: Prison              Anticipated d/c is to: Prison              Patient currently is not medically stable to d/c.  Still has chest tube with air leak.    Difficult to place patient No  Consultants:   Cardiothoracic Surgery    Procedures: VATS with Wedge Resection  Antimicrobials:  Anti-infectives (From admission, onward)   Start     Dose/Rate Route Frequency Ordered Stop   10/30/20 2130  ceFAZolin (ANCEF) IVPB 2g/100 mL premix        2 g 200 mL/hr over 30 Minutes Intravenous Every 8 hours 10/30/20 1641 10/31/20 0719        Subjective: Pt continued to complain of pain with chest tube.  Has been receiving oral dilaudid.  Pt requested IV dilaudid from consultant. Normal oral  intake.   Objective: Vitals:   11/24/20 0459 11/24/20 0800 11/24/20 1141 11/24/20 1603  BP: 116/87 107/84 106/72 113/77  Pulse: 85 80 93 93  Resp: 19 18 17 18   Temp: 98.3 F (36.8 C) (!) 97.5 F (36.4 C) 98.9 F (37.2 C) 98.3 F (36.8 C)  TempSrc: Oral Oral Oral Oral  SpO2: 97% 98% 97% 100%  Weight:        Intake/Output Summary (Last 24 hours) at 11/24/2020 1752 Last data filed at 11/24/2020 0600 Gross per 24 hour  Intake 600 ml  Output 0 ml  Net 600 ml   Filed Weights   10/28/20 0511 11/06/20 0404 11/13/20 0329  Weight: 67.4 kg 67 kg 67.4 kg    Physical Exam: Constitutional: NAD, AAOx3 HEENT: conjunctivae and lids normal, EOMI CV: No cyanosis.   RESP: reduced effort, on RA, chest tube present on the right lower Extremities: No effusions, edema in BLE SKIN: warm, dry Neuro: II - XII grossly intact.   Psych: depressed mood and affect.     Data Reviewed: I have personally reviewed following labs and imaging studies  CBC: Recent Labs  Lab 11/18/20 0002 11/21/20 0816  WBC 6.9 8.1  NEUTROABS 4.2 5.5  HGB 11.2* 13.0  HCT 33.8* 39.2  MCV 90.6 92.9  PLT 248 244   Basic Metabolic Panel: Recent Labs  Lab 11/18/20 0002 11/21/20 0816  NA 137  139  K 3.9 3.8  CL 106 105  CO2 23 24  GLUCOSE 93 109*  BUN 15 13  CREATININE 0.93 0.95  CALCIUM 9.0 9.6  MG 2.0 1.9  PHOS 4.1 4.0   GFR: Estimated Creatinine Clearance: 95.6 mL/min (by C-G formula based on SCr of 0.95 mg/dL). Liver Function Tests: Recent Labs  Lab 11/18/20 0002 11/21/20 0816  AST 9* 10*  ALT 11 11  ALKPHOS 73 66  BILITOT 0.5 0.4  PROT 6.2* 6.9  ALBUMIN 3.2* 3.6   Recent Labs  Lab 11/21/20 0816  LIPASE 25   No results for input(s): AMMONIA in the last 168 hours. Coagulation Profile: No results for input(s): INR, PROTIME in the last 168 hours. Cardiac Enzymes: No results for input(s): CKTOTAL, CKMB, CKMBINDEX, TROPONINI in the last 168 hours. BNP (last 3 results) No results for  input(s): PROBNP in the last 8760 hours. HbA1C: No results for input(s): HGBA1C in the last 72 hours. CBG: No results for input(s): GLUCAP in the last 168 hours. Lipid Profile: No results for input(s): CHOL, HDL, LDLCALC, TRIG, CHOLHDL, LDLDIRECT in the last 72 hours. Thyroid Function Tests: No results for input(s): TSH, T4TOTAL, FREET4, T3FREE, THYROIDAB in the last 72 hours. Anemia Panel: No results for input(s): VITAMINB12, FOLATE, FERRITIN, TIBC, IRON, RETICCTPCT in the last 72 hours. Sepsis Labs: No results for input(s): PROCALCITON, LATICACIDVEN in the last 168 hours.  No results found for this or any previous visit (from the past 240 hour(s)).   RN Pressure Injury Documentation:     Estimated body mass index is 20.72 kg/m as calculated from the following:   Height as of 06/19/20: 5\' 11"  (1.803 m).   Weight as of this encounter: 67.4 kg.  Malnutrition Type:   Malnutrition Characteristics:    Nutrition Interventions:   Radiology Studies: No results found. Scheduled Meds: . acetaminophen  1,000 mg Oral Q8H  . apixaban  2.5 mg Oral BID  . bisacodyl  10 mg Oral Daily  . divalproex  500 mg Oral Daily  . divalproex  750 mg Oral QHS  . feeding supplement  237 mL Oral BID BM  . lacosamide  100 mg Oral BID  . polyethylene glycol  17 g Oral Daily  . senna-docusate  1 tablet Oral QHS  . zonisamide  300 mg Oral QHS   Continuous Infusions:   LOS: 27 days   , md [facility] PAGER is on AMION  If 7PM-7AM, please contact night-coverage [email or web address]

## 2020-11-24 NOTE — Progress Notes (Signed)
Patient at this time has no IV meds ordered, taking everything by mouth. Spoke with RN Bonnee Quin and she stated patient will need an IV Monday for surgery but right now does not emergently need an IV.

## 2020-11-25 ENCOUNTER — Inpatient Hospital Stay (HOSPITAL_COMMUNITY): Payer: Self-pay

## 2020-11-25 LAB — BASIC METABOLIC PANEL
Anion gap: 5 (ref 5–15)
BUN: 12 mg/dL (ref 6–20)
CO2: 26 mmol/L (ref 22–32)
Calcium: 8.9 mg/dL (ref 8.9–10.3)
Chloride: 108 mmol/L (ref 98–111)
Creatinine, Ser: 0.92 mg/dL (ref 0.61–1.24)
GFR, Estimated: >60 mL/min (ref >60)
Glucose, Bld: 120 mg/dL — ABNORMAL HIGH (ref 70–99)
Potassium: 3.9 mmol/L (ref 3.5–5.1)
Sodium: 139 mmol/L (ref 135–145)

## 2020-11-25 LAB — CBC
HCT: 33 % — ABNORMAL LOW (ref 39.0–52.0)
Hemoglobin: 11.1 g/dL — ABNORMAL LOW (ref 13.0–17.0)
MCH: 30.9 pg (ref 26.0–34.0)
MCHC: 33.6 g/dL (ref 30.0–36.0)
MCV: 91.9 fL (ref 80.0–100.0)
Platelets: 226 10*3/uL (ref 150–400)
RBC: 3.59 MIL/uL — ABNORMAL LOW (ref 4.22–5.81)
RDW: 13.7 % (ref 11.5–15.5)
WBC: 5.6 10*3/uL (ref 4.0–10.5)
nRBC: 0 % (ref 0.0–0.2)

## 2020-11-25 LAB — MAGNESIUM: Magnesium: 2 mg/dL (ref 1.7–2.4)

## 2020-11-25 NOTE — Progress Notes (Signed)
PROGRESS NOTE    Ronald Butler  ZOX:[id number] DOB: 11-Jan-1976 DOA: 10/28/2020 PCP: Patient, No Pcp Per (Inactive)   Brief Narrative: The patient is a 45 year old Caucasian male with a past medical history significant for but not limited to recurrent spontaneous pneumothorax previously refused VATS, polysubstance abuse, seizure disorder as well as other comorbidities who was apprehended by law enforcement and incarcerated and subsequently developed chest pain and shortness of breath and was found to have a right sided pneumothorax.  A chest tube was placed and he was transferred to [name] for cardiothoracic surgery evaluation from [facility].  He subsequently underwent VATS with wedge resection on 10/30/2020. He was also found to be positive for COVID-19 incidentally.  On 11/02/2020 his chest tube was clamped and pneumothorax reoccurred with lung collapse.  Continues to have his chest tube to waterseal and an air leak so cardiothoracic surgery recommending continuing chest tube.  Repeat CXR on 11/16/20 shows "Right chest tube in stable position. Persistent right base pneumothorax." Patient underwent a CT Chest to check for any further bullous disease and it showed "Small residual right basilar pneumothorax also with incomplete re-expansion of the superior segment of the right lower lobe despite well-positioned thoracostomy tube. Evidence of prior surgical resection of pulmonary blebs at the right lung apex and superior segment of the right lower lobe. Three small blebs present along the anterior aspect of the right lung apex. Mild centrilobular pulmonary emphysema."\  Clamping trial attempted 11/17/2020 but resulted in an expanding basilar pneumothorax.  CT surgery now recommending leaving Chest tube to waterseal through the weekend and repeated chest x-[facility] Monday which continues to show trace Right Apical Pneumothorax. CT Surgery recommending leaving to water another clamping trial again  today. They continue to encourage Incentive Spirometry.  He continues to have some nausea and abdominal pain still he was resumed on IV fluids and lab work was obtained.  We will need to continue to treat his pain and if his abdominal pain and nausea worsened we will obtain further imaging.  Assessment & Plan:   Active Problems:   Pneumothorax  Right Sided Pneumothorax s/p multiple Wedge bleb Resections on 10/30/20 Persistent air leak -CT Surgery following and appreciate care and further management from them -The chest tube is to waterseal and there is a 1+ to 2 air leak with cough and the CXR this AM showed "Stable small right pneumothorax. Stable position of the right chest tube. No focal lung disease." -CT Surgery attempted Clamping Trial 11/17/20 and chest x-[facility] repeated and showed that the right basilar pneumothorax had expanded with a clamp trial so they placed the chest tube back to waterseal attempted clamp trial on 5/20 resulting in a expanded basilar pneumothorax he will remain on waterseal through the weekend with trace residual pneumothorax laterally per CT surgery recommendations and after re-evaluation again the day before yesterday they recommending leaving to [facility] again yesterday and attempting another clamping trial 11/21/20 and this is currently being done -Cardiothoracic surgery recommending checking a CT of the chest on 11/16/20  and showed "Small residual right basilar pneumothorax also with incomplete re-expansion of the superior segment of the right lower lobe despite well-positioned thoracostomy tube. Evidence of prior surgical resection of pulmonary blebs at the right lung apex and superior segment of the right lower lobe. Three small blebs present along the anterior aspect of the right lung apex. Mild centrilobular pulmonary emphysema." --Pt refused procedure for intrabronchial valve placement scheduled for 5/26. Plan: --CT surg to reschedule  intrabronchial valves procedure  as able --chest tube management per CT surg  Chest tube pain --has been receiving IV dilaudid q4h for 25 days since presentation, stopped on 5/25. Plan: --Do not order IV dilaudid. --continue oral dilaudid 4 mg q4h PRN  Nausea and Vomiting, resolved --Currently tolerating regular diet. --No need for MIVF --zofran PRN  COVID-19 -Incidental finding and Unclear how long he has had this -Observing for now -Off of Isolation -Tested Positive 10/28/20  Seizure disorder --on Depakote, Vimpat and zonisamide --cont current regimen  Current smoker --encouraged cessation on 5/26, pt agreeable  Normocytic Anemia  Folate def -Stable.  Patient's hemoglobin/hematocrit is now 11.2/33.8 on last check (11/18/20) -Checked Anemia Panel as he is anticoagulated with apixaban 2.5 mils p.o. twice daily and it showed an iron level of 66, U IBC of 403, TIBC 469, saturation ratios of 14%, ferritin of 133 and folate of 4.5 as well as vitamin B12 level 356 - No overt bleeding noted Plan: --cont folic acid supplement (new)   DVT prophylaxis: Anticoagulated with Eliquis 2.5 mg p.o. twice daily Code Status: FULL CODE  Family Communication:   Status is: Inpatient   Dispo: [facility]              Anticipated d/c is to: [facility]              Patient currently is not medically stable to d/c.  Still has chest tube with air leak, pending intrabronchial valve placement next week.    Difficult to place patient No  Consultants:   Cardiothoracic Surgery    Procedures: VATS with Wedge Resection  Antimicrobials:  Anti-infectives (From admission, onward)   Start     Dose/Rate Route Frequency Ordered Stop   10/30/20 2130  ceFAZolin (ANCEF) IVPB 2g/100 mL premix        2 g 200 mL/hr over 30 Minutes Intravenous Every 8 hours 10/30/20 1641 10/31/20 0719        Subjective: Pt continued to ask for IV dilaudid from consultants.  Normal oral intake and having BM's.   Objective: Vitals:    11/25/20 0524 11/25/20 0720 11/25/20 1219 11/25/20 1601  BP: 116/86 102/75 96/70 95/73   Pulse: 82 78 78 85  Resp: 16 16 20 17   Temp: 98.5 F (36.9 C) 97.8 F (36.6 C) 98 F (36.7 C) 98.3 F (36.8 C)  TempSrc: Oral Oral Oral Oral  SpO2: 97% 98% 98% 96%  Weight:        Intake/Output Summary (Last 24 hours) at 11/25/2020 1607 Last data filed at 11/25/2020 1605 Gross per 24 hour  Intake 960 ml  Output 1310 ml  Net -350 ml   Filed Weights   10/28/20 0511 11/06/20 0404 11/13/20 0329  Weight: 67.4 kg 67 kg 67.4 kg    Physical Exam: Constitutional: NAD, AAOx3 HEENT: conjunctivae and lids normal, EOMI CV: No cyanosis.   RESP: normal respiratory effort, on RA, chest tube present over posterior right base Extremities: No effusions, edema in BLE SKIN: warm, dry Neuro: II - XII grossly intact.   Psych: depressed mood and affect.      Data Reviewed: I have personally reviewed following labs and imaging studies  CBC: Recent Labs  Lab 11/21/20 0816 11/25/20 0030  WBC 8.1 5.6  NEUTROABS 5.5  --   HGB 13.0 11.1*  HCT 39.2 33.0*  MCV 92.9 91.9  PLT 244 226   Basic Metabolic Panel: Recent Labs  Lab 11/21/20 0816 11/25/20 0030  NA 139  139  K 3.8 3.9  CL 105 108  CO2 24 26  GLUCOSE 109* 120*  BUN 13 12  CREATININE 0.95 0.92  CALCIUM 9.6 8.9  MG 1.9 2.0  PHOS 4.0  --    GFR: Estimated Creatinine Clearance: 98.7 mL/min (by C-G formula based on SCr of 0.92 mg/dL). Liver Function Tests: Recent Labs  Lab 11/21/20 0816  AST 10*  ALT 11  ALKPHOS 66  BILITOT 0.4  PROT 6.9  ALBUMIN 3.6   Recent Labs  Lab 11/21/20 0816  LIPASE 25   No results for input(s): AMMONIA in the last 168 hours. Coagulation Profile: No results for input(s): INR, PROTIME in the last 168 hours. Cardiac Enzymes: No results for input(s): CKTOTAL, CKMB, CKMBINDEX, TROPONINI in the last 168 hours. BNP (last 3 results) No results for input(s): PROBNP in the last 8760 hours. HbA1C: No  results for input(s): HGBA1C in the last 72 hours. CBG: No results for input(s): GLUCAP in the last 168 hours. Lipid Profile: No results for input(s): CHOL, HDL, LDLCALC, TRIG, CHOLHDL, LDLDIRECT in the last 72 hours. Thyroid Function Tests: No results for input(s): TSH, T4TOTAL, FREET4, T3FREE, THYROIDAB in the last 72 hours. Anemia Panel: No results for input(s): VITAMINB12, FOLATE, FERRITIN, TIBC, IRON, RETICCTPCT in the last 72 hours. Sepsis Labs: No results for input(s): PROCALCITON, LATICACIDVEN in the last 168 hours.  No results found for this or any previous visit (from the past 240 hour(s)).   RN Pressure Injury Documentation:     Estimated body mass index is 20.72 kg/m as calculated from the following:   Height as of 06/19/20: 5\' 11"  (1.803 m).   Weight as of this encounter: 67.4 kg.  Malnutrition Type:   Malnutrition Characteristics:    Nutrition Interventions:   Radiology Studies: DG CHEST PORT 1 VIEW  Result Date: 11/25/2020 CLINICAL DATA:  Pneumothorax EXAM: PORTABLE CHEST 1 VIEW COMPARISON:  11/22/2020 FINDINGS: Tiny right apical pneumothorax, grossly unchanged, with indwelling right chest tube. Left lung is clear. No pleural effusion. The heart is normal in size. IMPRESSION: Tiny right apical pneumothorax, grossly unchanged, with indwelling right chest tube. Electronically Signed   By: 11/24/2020 M.D.   On: 11/25/2020 08:08   Scheduled Meds: . acetaminophen  1,000 mg Oral Q8H  . apixaban  2.5 mg Oral BID  . bisacodyl  10 mg Oral Daily  . divalproex  500 mg Oral Daily  . divalproex  750 mg Oral QHS  . feeding supplement  237 mL Oral BID BM  . lacosamide  100 mg Oral BID  . polyethylene glycol  17 g Oral Daily  . senna-docusate  1 tablet Oral QHS  . zonisamide  300 mg Oral QHS   Continuous Infusions:   LOS: 28 days   11/27/2020, md [facility] PAGER is on AMION  If 7PM-7AM, please contact night-coverage [email or web address]

## 2020-11-25 NOTE — Progress Notes (Signed)
26 Days Post-Op Procedure(s) (LRB): XI ROBOTIC ASSISTED THORASCOPY-WEDGE RESECTION,UPPER LOBE AND LOWER LOBE (Right) INTERCOSTAL NERVE BLOCK (Right) PLEURECTOMY (Right) Subjective: C/o pain, says PO dilaudid doesn't work, only IV  Objective: Vital signs in last 24 hours: Temp:  [97.8 F (36.6 C)-98.9 F (37.2 C)] 97.8 F (36.6 C) (05/28 0720) Pulse Rate:  [78-99] 78 (05/28 0720) Cardiac Rhythm: Normal sinus rhythm (05/28 0730) Resp:  [16-20] 16 (05/28 0720) BP: (102-117)/(72-86) 102/75 (05/28 0720) SpO2:  [97 %-100 %] 98 % (05/28 0720)  Hemodynamic parameters for last 24 hours:    Intake/Output from previous day: 05/27 0701 - 05/28 0700 In: 600 [P.O.:600] Out: 510 [Urine:450; Chest Tube:60] Intake/Output this shift: Total I/O In: 240 [P.O.:240] Out: -   General appearance: alert and no distress Neurologic: intact Lungs: clear to auscultation bilaterally + air leak  Lab Results: Recent Labs    11/25/20 0030  WBC 5.6  HGB 11.1*  HCT 33.0*  PLT 226   BMET:  Recent Labs    11/25/20 0030  NA 139  K 3.9  CL 108  CO2 26  GLUCOSE 120*  BUN 12  CREATININE 0.92  CALCIUM 8.9    PT/INR: No results for input(s): LABPROT, INR in the last 72 hours. ABG No results found for: PHART, HCO3, TCO2, ACIDBASEDEF, O2SAT CBG (last 3)  No results for input(s): GLUCAP in the last 72 hours.  Assessment/Plan: S/P Procedure(s) (LRB): XI ROBOTIC ASSISTED THORASCOPY-WEDGE RESECTION,UPPER LOBE AND LOWER LOBE (Right) INTERCOSTAL NERVE BLOCK (Right) PLEURECTOMY (Right) - Persistent small air leak On a relatively high dose of PO dilaudid, explained that mechanism of action is same for Po and IV Repeat CXR in AM Possible IBV placement next week   LOS: 28 days    Loreli Slot 11/25/2020

## 2020-11-26 ENCOUNTER — Inpatient Hospital Stay (HOSPITAL_COMMUNITY): Payer: Self-pay

## 2020-11-26 NOTE — Progress Notes (Signed)
PROGRESS NOTE    Sergey Ishler  ZOX:[id number] DOB: 11-Jan-1976 DOA: 10/28/2020 PCP: Patient, No Pcp Per (Inactive)   Brief Narrative: The patient is a 45 year old Caucasian male with a past medical history significant for but not limited to recurrent spontaneous pneumothorax previously refused VATS, polysubstance abuse, seizure disorder as well as other comorbidities who was apprehended by law enforcement and incarcerated and subsequently developed chest pain and shortness of breath and was found to have a right sided pneumothorax.  A chest tube was placed and he was transferred to [name] for cardiothoracic surgery evaluation from [facility].  He subsequently underwent VATS with wedge resection on 10/30/2020. He was also found to be positive for COVID-19 incidentally.  On 11/02/2020 his chest tube was clamped and pneumothorax reoccurred with lung collapse.  Continues to have his chest tube to waterseal and an air leak so cardiothoracic surgery recommending continuing chest tube.  Repeat CXR on 11/16/20 shows "Right chest tube in stable position. Persistent right base pneumothorax." Patient underwent a CT Chest to check for any further bullous disease and it showed "Small residual right basilar pneumothorax also with incomplete re-expansion of the superior segment of the right lower lobe despite well-positioned thoracostomy tube. Evidence of prior surgical resection of pulmonary blebs at the right lung apex and superior segment of the right lower lobe. Three small blebs present along the anterior aspect of the right lung apex. Mild centrilobular pulmonary emphysema."\  Clamping trial attempted 11/17/2020 but resulted in an expanding basilar pneumothorax.  CT surgery now recommending leaving Chest tube to waterseal through the weekend and repeated chest x-[facility] Monday which continues to show trace Right Apical Pneumothorax. CT Surgery recommending leaving to water another clamping trial again  today. They continue to encourage Incentive Spirometry.  He continues to have some nausea and abdominal pain still he was resumed on IV fluids and lab work was obtained.  We will need to continue to treat his pain and if his abdominal pain and nausea worsened we will obtain further imaging.  Assessment & Plan:   Active Problems:   Pneumothorax  Right Sided Pneumothorax s/p multiple Wedge bleb Resections on 10/30/20 Persistent air leak -CT Surgery following and appreciate care and further management from them -The chest tube is to waterseal and there is a 1+ to 2 air leak with cough and the CXR this AM showed "Stable small right pneumothorax. Stable position of the right chest tube. No focal lung disease." -CT Surgery attempted Clamping Trial 11/17/20 and chest x-[facility] repeated and showed that the right basilar pneumothorax had expanded with a clamp trial so they placed the chest tube back to waterseal attempted clamp trial on 5/20 resulting in a expanded basilar pneumothorax he will remain on waterseal through the weekend with trace residual pneumothorax laterally per CT surgery recommendations and after re-evaluation again the day before yesterday they recommending leaving to [facility] again yesterday and attempting another clamping trial 11/21/20 and this is currently being done -Cardiothoracic surgery recommending checking a CT of the chest on 11/16/20  and showed "Small residual right basilar pneumothorax also with incomplete re-expansion of the superior segment of the right lower lobe despite well-positioned thoracostomy tube. Evidence of prior surgical resection of pulmonary blebs at the right lung apex and superior segment of the right lower lobe. Three small blebs present along the anterior aspect of the right lung apex. Mild centrilobular pulmonary emphysema." --Pt refused procedure for intrabronchial valve placement scheduled for 5/26. Plan: --CT surg to reschedule  intrabronchial valves procedure  as able --chest tube management per CT surg  Chest tube pain --has been receiving IV dilaudid q4h for 25 days since presentation, stopped on 5/25. Plan: --Do not order IV dilaudid. --continue oral dilaudid 4 mg q4h PRN  Nausea and Vomiting, resolved --Currently tolerating regular diet. --No need for MIVF --zofran PRN  COVID-19 -Incidental finding and Unclear how long he has had this -Observing for now -Off of Isolation -Tested Positive 10/28/20  Seizure disorder --on Depakote, Vimpat and zonisamide --cont current regimen  Current smoker --encouraged cessation on 5/26, pt agreeable  Normocytic Anemia  Folate def -Stable.  Patient's hemoglobin/hematocrit is now 11.2/33.8 on last check (11/18/20) -Checked Anemia Panel as he is anticoagulated with apixaban 2.5 mils p.o. twice daily and it showed an iron level of 66, U IBC of 403, TIBC 469, saturation ratios of 14%, ferritin of 133 and folate of 4.5 as well as vitamin B12 level 356 - No overt bleeding noted Plan: --cont folic acid supplement (new)   DVT prophylaxis: Anticoagulated with Eliquis 2.5 mg p.o. twice daily Code Status: FULL CODE  Family Communication:   Status is: Inpatient   Dispo: The patient [facility] from: [facility]              Anticipated d/c is to: [facility]              Patient currently is not medically stable to d/c.  Still has chest tube with air leak, pending intrabronchial valve placement next week.    Difficult to place patient No  Consultants:   Cardiothoracic Surgery    Procedures: VATS with Wedge Resection  Antimicrobials:  Anti-infectives (From admission, onward)   Start     Dose/Rate Route Frequency Ordered Stop   10/30/20 2130  ceFAZolin (ANCEF) IVPB 2g/100 mL premix        2 g 200 mL/hr over 30 Minutes Intravenous Every 8 hours 10/30/20 1641 10/31/20 0719        Subjective: No change today.   Objective: Vitals:   11/25/20 2045 11/26/20 0020 11/26/20 0700 11/26/20 1129  BP: 116/73  107/85 94/75 113/76  Pulse: 79 89 84 83  Resp: 18 18 20 20   Temp: 99 F (37.2 C) 97.9 F (36.6 C) 98 F (36.7 C) 98 F (36.7 C)  TempSrc: Oral Oral Oral Oral  SpO2: 99% 100% 100% 98%  Weight:        Intake/Output Summary (Last 24 hours) at 11/26/2020 1553 Last data filed at 11/26/2020 1300 Gross per 24 hour  Intake 120 ml  Output 500 ml  Net -380 ml   Filed Weights   10/28/20 0511 11/06/20 0404 11/13/20 0329  Weight: 67.4 kg 67 kg 67.4 kg    Physical Exam: Constitutional: NAD CV: No cyanosis.   RESP: normal respiratory effort, on RA, chest tube present   Data Reviewed: I have personally reviewed following labs and imaging studies  CBC: Recent Labs  Lab 11/21/20 0816 11/25/20 0030  WBC 8.1 5.6  NEUTROABS 5.5  --   HGB 13.0 11.1*  HCT 39.2 33.0*  MCV 92.9 91.9  PLT 244 226   Basic Metabolic Panel: Recent Labs  Lab 11/21/20 0816 11/25/20 0030  NA 139 139  K 3.8 3.9  CL 105 108  CO2 24 26  GLUCOSE 109* 120*  BUN 13 12  CREATININE 0.95 0.92  CALCIUM 9.6 8.9  MG 1.9 2.0  PHOS 4.0  --    GFR: Estimated Creatinine Clearance: 98.7 mL/min (by C-G  formula based on SCr of 0.92 mg/dL). Liver Function Tests: Recent Labs  Lab 11/21/20 0816  AST 10*  ALT 11  ALKPHOS 66  BILITOT 0.4  PROT 6.9  ALBUMIN 3.6   Recent Labs  Lab 11/21/20 0816  LIPASE 25   No results for input(s): AMMONIA in the last 168 hours. Coagulation Profile: No results for input(s): INR, PROTIME in the last 168 hours. Cardiac Enzymes: No results for input(s): CKTOTAL, CKMB, CKMBINDEX, TROPONINI in the last 168 hours. BNP (last 3 results) No results for input(s): PROBNP in the last 8760 hours. HbA1C: No results for input(s): HGBA1C in the last 72 hours. CBG: No results for input(s): GLUCAP in the last 168 hours. Lipid Profile: No results for input(s): CHOL, HDL, LDLCALC, TRIG, CHOLHDL, LDLDIRECT in the last 72 hours. Thyroid Function Tests: No results for input(s): TSH,  T4TOTAL, FREET4, T3FREE, THYROIDAB in the last 72 hours. Anemia Panel: No results for input(s): VITAMINB12, FOLATE, FERRITIN, TIBC, IRON, RETICCTPCT in the last 72 hours. Sepsis Labs: No results for input(s): PROCALCITON, LATICACIDVEN in the last 168 hours.  No results found for this or any previous visit (from the past 240 hour(s)).   RN Pressure Injury Documentation:     Estimated body mass index is 20.72 kg/m as calculated from the following:   Height as of 06/19/20: 5\' 11"  (1.803 m).   Weight as of this encounter: 67.4 kg.  Malnutrition Type:   Malnutrition Characteristics:    Nutrition Interventions:   Radiology Studies: DG Chest Port 1 View  Result Date: 11/26/2020 CLINICAL DATA:  Evaluate right-sided pneumothorax EXAM: PORTABLE CHEST 1 VIEW COMPARISON:  11/25/2020; 11/22/2020; 11/20/2020; 11/17/2020 FINDINGS: Grossly unchanged cardiac silhouette and mediastinal contours. Stable postoperative change of the right hilum and right lung apex. Stable positioning of right-sided chest tube without definitive evidence of pneumothorax. The left hemithorax remains well aerated. No new focal airspace opacities. No pleural effusion. No evidence of edema. No acute osseous abnormalities. IMPRESSION: 1. Stable positioning of right-sided chest tube without definitive evidence pneumothorax. 2. Stable postoperative change of the right lung apex and hilum. Electronically Signed   By: 11/19/2020 M.D.   On: 11/26/2020 08:16   DG CHEST PORT 1 VIEW  Result Date: 11/25/2020 CLINICAL DATA:  Pneumothorax EXAM: PORTABLE CHEST 1 VIEW COMPARISON:  11/22/2020 FINDINGS: Tiny right apical pneumothorax, grossly unchanged, with indwelling right chest tube. Left lung is clear. No pleural effusion. The heart is normal in size. IMPRESSION: Tiny right apical pneumothorax, grossly unchanged, with indwelling right chest tube. Electronically Signed   By: 11/24/2020 M.D.   On: 11/25/2020 08:08   Scheduled  Meds: . acetaminophen  1,000 mg Oral Q8H  . apixaban  2.5 mg Oral BID  . bisacodyl  10 mg Oral Daily  . divalproex  500 mg Oral Daily  . divalproex  750 mg Oral QHS  . feeding supplement  237 mL Oral BID BM  . lacosamide  100 mg Oral BID  . polyethylene glycol  17 g Oral Daily  . senna-docusate  1 tablet Oral QHS  . zonisamide  300 mg Oral QHS   Continuous Infusions:   LOS: 29 days   11/27/2020, md [facility] PAGER is on AMION  If 7PM-7AM, please contact night-coverage [email or web address]

## 2020-11-26 NOTE — Progress Notes (Signed)
      [address]       [place] [address]             [phone]       27 Days Post-Op Procedure(s) (LRB): XI ROBOTIC ASSISTED THORASCOPY-WEDGE RESECTION,UPPER LOBE AND LOWER LOBE (Right) INTERCOSTAL NERVE BLOCK (Right) PLEURECTOMY (Right)  Subjective: Patient resting. He awakened to say alarm on monitor is driving him crazy  Objective: Vital signs in last 24 hours: Temp:  [97.9 F (36.6 C)-99 F (37.2 C)] 98 F (36.7 C) (05/29 0700) Pulse Rate:  [78-89] 84 (05/29 0700) Cardiac Rhythm: Normal sinus rhythm (05/28 1900) Resp:  [17-20] 20 (05/29 0700) BP: (94-116)/(70-85) 94/75 (05/29 0700) SpO2:  [96 %-100 %] 100 % (05/29 0700)     Intake/Output from previous day: 05/28 0701 - 05/29 0700 In: 360 [P.O.:360] Out: 800 [Urine:800]   Physical Exam:  Cardiovascular: RRR Pulmonary: Clear to auscultation bilaterally Abdomen: Soft, non tender, bowel sounds present. Wounds: Clean and dry.  No erythema or signs of infection. Chest Tube: to water seal, ++ intermittent air leak with cough  Lab Results: CBC: Recent Labs    11/25/20 0030  WBC 5.6  HGB 11.1*  HCT 33.0*  PLT 226   BMET:  Recent Labs    11/25/20 0030  NA 139  K 3.9  CL 108  CO2 26  GLUCOSE 120*  BUN 12  CREATININE 0.92  CALCIUM 8.9    PT/INR: No results for input(s): LABPROT, INR in the last 72 hours. ABG:  INR: Will add last result for INR, ABG once components are confirmed Will add last 4 CBG results once components are confirmed  Assessment/Plan:  1. CV - SR 2.  Pulmonary - On room air. Patient has failed clamping chest tube twice (increased pneumothorax). Chest tube with scant output last 24 hours. Chest tube is to water seal and there is a ++ intermittent air leak with cough. CXR appears to show no pneumothorax. Likely will need IBVs early next week. Encourage incentive spirometer.   Annalucia Laino M ZimmermanPA-C 11/26/2020,8:38 AM [phone]

## 2020-11-27 ENCOUNTER — Inpatient Hospital Stay (HOSPITAL_COMMUNITY): Payer: Self-pay

## 2020-11-27 NOTE — Progress Notes (Signed)
28 Days Post-Op Procedure(s) (LRB): XI ROBOTIC ASSISTED THORASCOPY-WEDGE RESECTION,UPPER LOBE AND LOWER LOBE (Right) INTERCOSTAL NERVE BLOCK (Right) PLEURECTOMY (Right) Subjective: C/o pain from tube, frustrated with LOS  Objective: Vital signs in last 24 hours: Temp:  [97.8 F (36.6 C)-99 F (37.2 C)] 98.3 F (36.8 C) (05/30 0712) Pulse Rate:  [78-99] 99 (05/30 0712) Resp:  [18-20] 20 (05/30 0712) BP: (102-121)/(72-83) 102/75 (05/30 0712) SpO2:  [98 %-100 %] 100 % (05/30 0712)  Hemodynamic parameters for last 24 hours:    Intake/Output from previous day: 05/29 0701 - 05/30 0700 In: 180 [P.O.:180] Out: 1575 [Urine:1575] Intake/Output this shift: No intake/output data recorded.  General appearance: alert, cooperative and no distress Lungs: clear to auscultation bilaterally + air leak  Lab Results: Recent Labs    11/25/20 0030  WBC 5.6  HGB 11.1*  HCT 33.0*  PLT 226   BMET:  Recent Labs    11/25/20 0030  NA 139  K 3.9  CL 108  CO2 26  GLUCOSE 120*  BUN 12  CREATININE 0.92  CALCIUM 8.9    PT/INR: No results for input(s): LABPROT, INR in the last 72 hours. ABG No results found for: PHART, HCO3, TCO2, ACIDBASEDEF, O2SAT CBG (last 3)  No results for input(s): GLUCAP in the last 72 hours.  Assessment/Plan: S/P Procedure(s) (LRB): XI ROBOTIC ASSISTED THORASCOPY-WEDGE RESECTION,UPPER LOBE AND LOWER LOBE (Right) INTERCOSTAL NERVE BLOCK (Right) PLEURECTOMY (Right) -Still has a small air leak, CXR this AM shows a basilar pneumothorax Leave tube in place on water seal Will likely need IBV   LOS: 30 days    Loreli Slot 11/27/2020

## 2020-11-27 NOTE — Progress Notes (Signed)
PROGRESS NOTE    Ronald Butler  ZOX:[id number] DOB: 11-Jan-1976 DOA: 10/28/2020 PCP: Patient, No Pcp Per (Inactive)   Brief Narrative: The patient is a 45 year old Caucasian male with a past medical history significant for but not limited to recurrent spontaneous pneumothorax previously refused VATS, polysubstance abuse, seizure disorder as well as other comorbidities who was apprehended by law enforcement and incarcerated and subsequently developed chest pain and shortness of breath and was found to have a right sided pneumothorax.  A chest tube was placed and he was transferred to [name] for cardiothoracic surgery evaluation from [facility].  He subsequently underwent VATS with wedge resection on 10/30/2020. He was also found to be positive for COVID-19 incidentally.  On 11/02/2020 his chest tube was clamped and pneumothorax reoccurred with lung collapse.  Continues to have his chest tube to waterseal and an air leak so cardiothoracic surgery recommending continuing chest tube.  Repeat CXR on 11/16/20 shows "Right chest tube in stable position. Persistent right base pneumothorax." Patient underwent a CT Chest to check for any further bullous disease and it showed "Small residual right basilar pneumothorax also with incomplete re-expansion of the superior segment of the right lower lobe despite well-positioned thoracostomy tube. Evidence of prior surgical resection of pulmonary blebs at the right lung apex and superior segment of the right lower lobe. Three small blebs present along the anterior aspect of the right lung apex. Mild centrilobular pulmonary emphysema."\  Clamping trial attempted 11/17/2020 but resulted in an expanding basilar pneumothorax.  CT surgery now recommending leaving Chest tube to waterseal through the weekend and repeated chest x-[place] Monday which continues to show trace Right Apical Pneumothorax. CT Surgery recommending leaving to water another clamping trial again  today. They continue to encourage Incentive Spirometry.  He continues to have some nausea and abdominal pain still he was resumed on IV fluids and lab work was obtained.  We will need to continue to treat his pain and if his abdominal pain and nausea worsened we will obtain further imaging.  Assessment & Plan:   Active Problems:   Pneumothorax  Right Sided Pneumothorax s/p multiple Wedge bleb Resections on 10/30/20 Persistent air leak -CT Surgery following and appreciate care and further management from them -The chest tube is to waterseal and there is a 1+ to 2 air leak with cough and the CXR this AM showed "Stable small right pneumothorax. Stable position of the right chest tube. No focal lung disease." -CT Surgery attempted Clamping Trial 11/17/20 and chest x-[place] repeated and showed that the right basilar pneumothorax had expanded with a clamp trial so they placed the chest tube back to waterseal attempted clamp trial on 5/20 resulting in a expanded basilar pneumothorax he will remain on waterseal through the weekend with trace residual pneumothorax laterally per CT surgery recommendations and after re-evaluation again the day before yesterday they recommending leaving to [facility] again yesterday and attempting another clamping trial 11/21/20 and this is currently being done -Cardiothoracic surgery recommending checking a CT of the chest on 11/16/20  and showed "Small residual right basilar pneumothorax also with incomplete re-expansion of the superior segment of the right lower lobe despite well-positioned thoracostomy tube. Evidence of prior surgical resection of pulmonary blebs at the right lung apex and superior segment of the right lower lobe. Three small blebs present along the anterior aspect of the right lung apex. Mild centrilobular pulmonary emphysema." --Pt refused procedure for intrabronchial valve placement scheduled for 5/26. Plan: --CT surg to reschedule  intrabronchial valves procedure  as able --chest tube management per CT surg  Chest tube pain --has been receiving IV dilaudid q4h for 25 days since presentation, stopped on 5/25. Plan: --Do not order IV dilaudid. --continue oral dilaudid 4 mg q4h PRN  Nausea and Vomiting, resolved --Currently tolerating regular diet. --No need for MIVF --zofran PRN  COVID-19 -Incidental finding and Unclear how long he has had this -Observing for now -Off of Isolation -Tested Positive 10/28/20  Seizure disorder --on Depakote, Vimpat and zonisamide --cont current regimen  Current smoker --encouraged cessation on 5/26, pt agreeable  Normocytic Anemia  Folate def -Stable.  Patient's hemoglobin/hematocrit is now 11.2/33.8 on last check (11/18/20) -Checked Anemia Panel as he is anticoagulated with apixaban 2.5 mils p.o. twice daily and it showed an iron level of 66, U IBC of 403, TIBC 469, saturation ratios of 14%, ferritin of 133 and folate of 4.5 as well as vitamin B12 level 356 - No overt bleeding noted Plan: --cont folic acid supplement (new)   DVT prophylaxis: Anticoagulated with Eliquis 2.5 mg p.o. twice daily Code Status: FULL CODE  Family Communication:   Status is: Inpatient   Dispo: The [facility]              Anticipated d/c is to: Prison              Patient currently is not medically stable to d/c.  Still has chest tube with air leak, pending intrabronchial valve placement next week.    Difficult to place patient No  Consultants:   Cardiothoracic Surgery    Procedures: VATS with Wedge Resection  Antimicrobials:  Anti-infectives (From admission, onward)   Start     Dose/Rate Route Frequency Ordered Stop   10/30/20 2130  ceFAZolin (ANCEF) IVPB 2g/100 mL premix        2 g 200 mL/hr over 30 Minutes Intravenous Every 8 hours 10/30/20 1641 10/31/20 0719        Subjective: Pt was in handcuff today due to concern of pt eloping.   Still has a small air leak, CXR this AM shows a basilar  pneumothorax   Objective: Vitals:   11/26/20 2356 11/27/20 0451 11/27/20 0712 11/27/20 1220  BP: 108/73 121/83 102/75 100/63  Pulse: 94 79 99   Resp: 18 19 20 18   Temp: 99 F (37.2 C) 98.2 F (36.8 C) 98.3 F (36.8 C)   TempSrc: Oral Oral Oral   SpO2: 99% 100% 100%   Weight:        Intake/Output Summary (Last 24 hours) at 11/27/2020 1728 Last data filed at 11/27/2020 0400 Gross per 24 hour  Intake 0 ml  Output 1575 ml  Net -1575 ml   Filed Weights   10/28/20 0511 11/06/20 0404 11/13/20 0329  Weight: 67.4 kg 67 kg 67.4 kg    Physical Exam: Constitutional: NAD, AAOx3 HEENT: conjunctivae and lids normal, EOMI CV: No cyanosis.   RESP: normal respiratory effort, on RA, chest tube present Neuro: II - XII grossly intact.   Psych: depressed mood and affect.     Data Reviewed: I have personally reviewed following labs and imaging studies  CBC: Recent Labs  Lab 11/21/20 0816 11/25/20 0030  WBC 8.1 5.6  NEUTROABS 5.5  --   HGB 13.0 11.1*  HCT 39.2 33.0*  MCV 92.9 91.9  PLT 244 226   Basic Metabolic Panel: Recent Labs  Lab 11/21/20 0816 11/25/20 0030  NA 139 139  K 3.8 3.9  CL  105 108  CO2 24 26  GLUCOSE 109* 120*  BUN 13 12  CREATININE 0.95 0.92  CALCIUM 9.6 8.9  MG 1.9 2.0  PHOS 4.0  --    GFR: Estimated Creatinine Clearance: 98.7 mL/min (by C-G formula based on SCr of 0.92 mg/dL). Liver Function Tests: Recent Labs  Lab 11/21/20 0816  AST 10*  ALT 11  ALKPHOS 66  BILITOT 0.4  PROT 6.9  ALBUMIN 3.6   Recent Labs  Lab 11/21/20 0816  LIPASE 25   No results for input(s): AMMONIA in the last 168 hours. Coagulation Profile: No results for input(s): INR, PROTIME in the last 168 hours. Cardiac Enzymes: No results for input(s): CKTOTAL, CKMB, CKMBINDEX, TROPONINI in the last 168 hours. BNP (last 3 results) No results for input(s): PROBNP in the last 8760 hours. HbA1C: No results for input(s): HGBA1C in the last 72 hours. CBG: No results for  input(s): GLUCAP in the last 168 hours. Lipid Profile: No results for input(s): CHOL, HDL, LDLCALC, TRIG, CHOLHDL, LDLDIRECT in the last 72 hours. Thyroid Function Tests: No results for input(s): TSH, T4TOTAL, FREET4, T3FREE, THYROIDAB in the last 72 hours. Anemia Panel: No results for input(s): VITAMINB12, FOLATE, FERRITIN, TIBC, IRON, RETICCTPCT in the last 72 hours. Sepsis Labs: No results for input(s): PROCALCITON, LATICACIDVEN in the last 168 hours.  No results found for this or any previous visit (from the past 240 hour(s)).   RN Pressure Injury Documentation:     Estimated body mass index is 20.72 kg/m as calculated from the following:   Height as of 06/19/20: 5\' 11"  (1.803 m).   Weight as of this encounter: 67.4 kg.  Malnutrition Type:   Malnutrition Characteristics:    Nutrition Interventions:   Radiology Studies: DG CHEST PORT 1 VIEW  Result Date: 11/27/2020 CLINICAL DATA:  45 year old male with history of pneumothorax. EXAM: PORTABLE CHEST 1 VIEW COMPARISON:  Chest x-[place] 11/26/2020. FINDINGS: Right-sided chest tube remains in position with tip in the apex of the right hemithorax. There continues to be a right sided pneumothorax which is very small in the apex of the right hemithorax, but there is a much larger basilar component on today's examination. No left pneumothorax. Lungs are clear bilaterally. No pleural effusions. No evidence of pulmonary edema. Heart size is normal. Mediastinal contours are within normal limits. IMPRESSION: 1. Enlarging right-sided pneumothorax, predominantly in the base of the right hemithorax, as above. Right chest tube is stable in position. Electronically Signed   By: 11/28/2020 M.D.   On: 11/27/2020 08:14   DG Chest Port 1 View  Result Date: 11/26/2020 CLINICAL DATA:  Evaluate right-sided pneumothorax EXAM: PORTABLE CHEST 1 VIEW COMPARISON:  11/25/2020; 11/22/2020; 11/20/2020; 11/17/2020 FINDINGS: Grossly unchanged cardiac silhouette  and mediastinal contours. Stable postoperative change of the right hilum and right lung apex. Stable positioning of right-sided chest tube without definitive evidence of pneumothorax. The left hemithorax remains well aerated. No new focal airspace opacities. No pleural effusion. No evidence of edema. No acute osseous abnormalities. IMPRESSION: 1. Stable positioning of right-sided chest tube without definitive evidence pneumothorax. 2. Stable postoperative change of the right lung apex and hilum. Electronically Signed   By: 11/19/2020 M.D.   On: 11/26/2020 08:16   Scheduled Meds: . acetaminophen  1,000 mg Oral Q8H  . apixaban  2.5 mg Oral BID  . bisacodyl  10 mg Oral Daily  . divalproex  500 mg Oral Daily  . divalproex  750 mg Oral QHS  . feeding supplement  237 mL Oral BID BM  . lacosamide  100 mg Oral BID  . polyethylene glycol  17 g Oral Daily  . senna-docusate  1 tablet Oral QHS  . zonisamide  300 mg Oral QHS   Continuous Infusions:   LOS: 30 days   Darlin Priestly, md [facility] PAGER is on AMION  If 7PM-7AM, please contact night-coverage [email or web address]

## 2020-11-27 NOTE — Plan of Care (Signed)
  Problem: Activity: Goal: Risk for activity intolerance will decrease Outcome: Progressing   Problem: Nutrition: Goal: Adequate nutrition will be maintained Outcome: Progressing   Problem: Coping: Goal: Level of anxiety will decrease Outcome: Progressing   

## 2020-11-28 ENCOUNTER — Encounter (HOSPITAL_COMMUNITY): Payer: Self-pay | Admitting: Family Medicine

## 2020-11-28 ENCOUNTER — Inpatient Hospital Stay (HOSPITAL_COMMUNITY): Payer: Self-pay | Admitting: Anesthesiology

## 2020-11-28 ENCOUNTER — Inpatient Hospital Stay (HOSPITAL_COMMUNITY): Payer: Self-pay

## 2020-11-28 ENCOUNTER — Encounter (HOSPITAL_COMMUNITY)
Admission: EM | Disposition: A | Discharge status: Home / Self Care | Payer: Self-pay | Source: Home / Self Care | Attending: Internal Medicine

## 2020-11-28 DIAGNOSIS — J9382 Other air leak: Secondary | ICD-10-CM

## 2020-11-28 HISTORY — PX: VIDEO BRONCHOSCOPY WITH INSERTION OF INTERBRONCHIAL VALVE (IBV): SHX6178

## 2020-11-28 SURGERY — BRONCHOSCOPY, FLEXIBLE, WITH INTRABRONCHIAL VALVE INSERTION
Anesthesia: General

## 2020-11-28 MED ORDER — LIDOCAINE 2% (20 MG/ML) 5 ML SYRINGE
INTRAMUSCULAR | Status: DC | PRN
  Administered 2020-11-28: 60 mg via INTRAVENOUS

## 2020-11-28 MED ORDER — ONDANSETRON HCL 4 MG/2ML IJ SOLN: 4.0000 mg | Freq: Once | INTRAMUSCULAR | Status: DC | PRN

## 2020-11-28 MED ORDER — MIDAZOLAM HCL 2 MG/2ML IJ SOLN
INTRAMUSCULAR | Status: AC
  Filled 2020-11-28: qty 2

## 2020-11-28 MED ORDER — ALBUMIN HUMAN 5 % IV SOLN: INTRAVENOUS | Status: DC | PRN

## 2020-11-28 MED ORDER — PHENYLEPHRINE 40 MCG/ML (10ML) SYRINGE FOR IV PUSH (FOR BLOOD PRESSURE SUPPORT)
PREFILLED_SYRINGE | INTRAVENOUS | Status: DC | PRN
  Administered 2020-11-28: 80 ug via INTRAVENOUS

## 2020-11-28 MED ORDER — CHLORHEXIDINE GLUCONATE 0.12 % MT SOLN: 15.0000 mL | Freq: Once | OROMUCOSAL | Status: AC

## 2020-11-28 MED ORDER — DEXMEDETOMIDINE (PRECEDEX) IN NS 20 MCG/5ML (4 MCG/ML) IV SYRINGE
PREFILLED_SYRINGE | INTRAVENOUS | Status: DC | PRN
  Administered 2020-11-28: 8 ug via INTRAVENOUS
  Administered 2020-11-28: 20 ug via INTRAVENOUS
  Administered 2020-11-28: 12 ug via INTRAVENOUS

## 2020-11-28 MED ORDER — PROPOFOL 10 MG/ML IV BOLUS
INTRAVENOUS | Status: DC | PRN
  Administered 2020-11-28: 200 mg via INTRAVENOUS

## 2020-11-28 MED ORDER — LACTATED RINGERS IV SOLN: INTRAVENOUS | Status: DC

## 2020-11-28 MED ORDER — KETOROLAC TROMETHAMINE 30 MG/ML IJ SOLN
30.0000 mg | Freq: Once | INTRAMUSCULAR | Status: AC | PRN
  Administered 2020-11-28: 30 mg via INTRAVENOUS

## 2020-11-28 MED ORDER — PROPOFOL 10 MG/ML IV BOLUS
INTRAVENOUS | Status: AC
  Filled 2020-11-28: qty 20

## 2020-11-28 MED ORDER — FENTANYL CITRATE (PF) 100 MCG/2ML IJ SOLN
INTRAMUSCULAR | Status: AC
  Filled 2020-11-28: qty 2

## 2020-11-28 MED ORDER — PROPOFOL 10 MG/ML IV BOLUS: INTRAVENOUS | Status: DC | PRN

## 2020-11-28 MED ORDER — EPINEPHRINE PF 1 MG/ML IJ SOLN
INTRAMUSCULAR | Status: AC
  Filled 2020-11-28: qty 1

## 2020-11-28 MED ORDER — FENTANYL CITRATE (PF) 100 MCG/2ML IJ SOLN
25.0000 ug | INTRAMUSCULAR | Status: DC | PRN
  Administered 2020-11-28 (×3): 50 ug via INTRAVENOUS

## 2020-11-28 MED ORDER — ONDANSETRON HCL 4 MG/2ML IJ SOLN
INTRAMUSCULAR | Status: DC | PRN
  Administered 2020-11-28: 4 mg via INTRAVENOUS

## 2020-11-28 MED ORDER — HYDROMORPHONE HCL 1 MG/ML IJ SOLN: 0.2500 mg | INTRAMUSCULAR | Status: DC | PRN

## 2020-11-28 MED ORDER — CHLORHEXIDINE GLUCONATE 0.12 % MT SOLN
OROMUCOSAL | Status: AC
  Administered 2020-11-28: 15 mL via OROMUCOSAL
  Filled 2020-11-28: qty 15

## 2020-11-28 MED ORDER — HYDROMORPHONE HCL 1 MG/ML IJ SOLN
INTRAMUSCULAR | Status: AC
  Filled 2020-11-28: qty 0.5

## 2020-11-28 MED ORDER — FENTANYL CITRATE (PF) 250 MCG/5ML IJ SOLN
INTRAMUSCULAR | Status: AC
  Filled 2020-11-28: qty 5

## 2020-11-28 MED ORDER — FENTANYL CITRATE (PF) 250 MCG/5ML IJ SOLN
INTRAMUSCULAR | Status: DC | PRN
  Administered 2020-11-28 (×3): 50 ug via INTRAVENOUS
  Administered 2020-11-28: 100 ug via INTRAVENOUS

## 2020-11-28 MED ORDER — KETOROLAC TROMETHAMINE 30 MG/ML IJ SOLN
INTRAMUSCULAR | Status: AC
  Filled 2020-11-28: qty 1

## 2020-11-28 MED ORDER — HYDROMORPHONE HCL 2 MG PO TABS
8.0000 mg | ORAL_TABLET | Freq: Once | ORAL | Status: AC
  Administered 2020-11-28: 8 mg via ORAL
  Filled 2020-11-28: qty 4

## 2020-11-28 MED ORDER — ORAL CARE MOUTH RINSE: 15.0000 mL | Freq: Once | OROMUCOSAL | Status: AC

## 2020-11-28 MED ORDER — MIDAZOLAM HCL 2 MG/2ML IJ SOLN
INTRAMUSCULAR | Status: DC | PRN
  Administered 2020-11-28: 2 mg via INTRAVENOUS

## 2020-11-28 MED ORDER — ROCURONIUM BROMIDE 10 MG/ML (PF) SYRINGE
PREFILLED_SYRINGE | INTRAVENOUS | Status: DC | PRN
  Administered 2020-11-28: 70 mg via INTRAVENOUS

## 2020-11-28 MED ORDER — HYDROMORPHONE HCL 1 MG/ML IJ SOLN
INTRAMUSCULAR | Status: AC
  Filled 2020-11-28: qty 2

## 2020-11-28 MED ORDER — HYDROMORPHONE HCL 1 MG/ML IJ SOLN
INTRAMUSCULAR | Status: DC | PRN
  Administered 2020-11-28: .5 mg via INTRAVENOUS

## 2020-11-28 MED ORDER — SUGAMMADEX SODIUM 200 MG/2ML IV SOLN
INTRAVENOUS | Status: DC | PRN
  Administered 2020-11-28: 200 mg via INTRAVENOUS

## 2020-11-28 MED ORDER — DEXAMETHASONE SODIUM PHOSPHATE 10 MG/ML IJ SOLN
INTRAMUSCULAR | Status: DC | PRN
  Administered 2020-11-28: 5 mg via INTRAVENOUS

## 2020-11-28 SURGICAL SUPPLY — 43 items
ADAPTER VALVE BIOPSY EBUS (MISCELLANEOUS) IMPLANT
ADPTR VALVE BIOPSY EBUS (MISCELLANEOUS)
BLADE CLIPPER SURG (BLADE) ×2 IMPLANT
CANISTER SUCT 3000ML PPV (MISCELLANEOUS) ×2 IMPLANT
CATH BALLN 4FR (CATHETERS) ×2 IMPLANT
CATH LOADER DEPLOYMENT HUD (CATHETERS) ×2 IMPLANT
CNTNR URN SCR LID CUP LEK RST (MISCELLANEOUS) ×2 IMPLANT
CONT SPEC 4OZ STRL OR WHT (MISCELLANEOUS) ×4
COVER BACK TABLE 60X90IN (DRAPES) ×2 IMPLANT
DRAPE WARM FLUID 44X44 (DRAPES) ×2 IMPLANT
FILTER STRAW FLUID ASPIR (MISCELLANEOUS) ×2 IMPLANT
FORCEPS BIOP RJ4 1.8 (CUTTING FORCEPS) IMPLANT
FORCEPS RADIAL JAW LRG 4 PULM (INSTRUMENTS) ×1 IMPLANT
GAUZE SPONGE 4X4 12PLY STRL (GAUZE/BANDAGES/DRESSINGS) ×2 IMPLANT
GLOVE BIO SURGEON STRL SZ7 (GLOVE) ×2 IMPLANT
GLOVE BIO SURGEON STRL SZ7.5 (GLOVE) ×2 IMPLANT
GOWN STRL REUS W/ TWL XL LVL3 (GOWN DISPOSABLE) ×1 IMPLANT
GOWN STRL REUS W/TWL XL LVL3 (GOWN DISPOSABLE) ×2
KIT AIRWAY SIZING HUD (KITS) ×2 IMPLANT
KIT AIRWAY SIZING W/B5-23 BALL (VALVE) IMPLANT
KIT CLEAN ENDO COMPLIANCE (KITS) ×2 IMPLANT
KIT TURNOVER KIT B (KITS) ×2 IMPLANT
MARKER SKIN DUAL TIP RULER LAB (MISCELLANEOUS) ×2 IMPLANT
NS IRRIG 1000ML POUR BTL (IV SOLUTION) ×2 IMPLANT
OIL SILICONE PENTAX (PARTS (SERVICE/REPAIRS)) IMPLANT
PAD ARMBOARD 7.5X6 YLW CONV (MISCELLANEOUS) ×4 IMPLANT
RADIAL JAW LRG 4 PULMONARY (INSTRUMENTS) ×1
STOPCOCK MORSE 400PSI 3WAY (MISCELLANEOUS) ×2 IMPLANT
SYR 10ML LL (SYRINGE) ×4 IMPLANT
SYR 20ML ECCENTRIC (SYRINGE) ×2 IMPLANT
SYR 3ML LL SCALE MARK (SYRINGE) ×2 IMPLANT
TOWEL GREEN STERILE (TOWEL DISPOSABLE) ×2 IMPLANT
TOWEL GREEN STERILE FF (TOWEL DISPOSABLE) ×2 IMPLANT
TOWEL NATURAL 4PK STERILE (DISPOSABLE) ×2 IMPLANT
TRAP SPECIMEN MUCUS 40CC (MISCELLANEOUS) ×2 IMPLANT
TUBE CONNECTING 20X1/4 (TUBING) ×2 IMPLANT
UNDERPAD 30X36 HEAVY ABSORB (UNDERPADS AND DIAPERS) ×2 IMPLANT
VALVE BIOPSY  SINGLE USE (MISCELLANEOUS) ×1
VALVE BIOPSY SINGLE USE (MISCELLANEOUS) ×1 IMPLANT
VALVE IN CARTRIDGE 7MM HUD (Valve) ×6 IMPLANT
VALVE IN CARTRIDGE 9MM HUD (Valve) ×8 IMPLANT
VALVE SUCTION BRONCHIO DISP (MISCELLANEOUS) ×2 IMPLANT
WATER STERILE IRR 1000ML POUR (IV SOLUTION) ×2 IMPLANT

## 2020-11-28 NOTE — Plan of Care (Signed)

## 2020-11-28 NOTE — Plan of Care (Signed)
  Problem: Activity: Goal: Risk for activity intolerance will decrease Outcome: Progressing   Problem: Nutrition: Goal: Adequate nutrition will be maintained Outcome: Progressing   Problem: Coping: Goal: Level of anxiety will decrease Outcome: Not Progressing   

## 2020-11-28 NOTE — Anesthesia Postprocedure Evaluation (Signed)
Anesthesia Post Note  Patient: Ronald Butler  Procedure(s) Performed: VIDEO BRONCHOSCOPY WITH INSERTION OF INTERBRONCHIAL VALVE (IBV). (N/A )     Patient location during evaluation: PACU Anesthesia Type: General Level of consciousness: awake and alert Pain management: pain level controlled Vital Signs Assessment: post-procedure vital signs reviewed and stable Respiratory status: spontaneous breathing, nonlabored ventilation, respiratory function stable and patient connected to nasal cannula oxygen Cardiovascular status: blood pressure returned to baseline and stable Postop Assessment: no apparent nausea or vomiting Anesthetic complications: no   No complications documented.  Last Vitals:  Vitals:   11/28/20 1326 11/28/20 1655  BP: 99/70 113/72  Pulse: 68 92  Resp: 16 20  Temp: 36.6 C 36.9 C  SpO2: 99% 97%    Last Pain:  Vitals:   11/28/20 1725  TempSrc:   PainSc: 9                  Honor Fairbank S

## 2020-11-28 NOTE — Anesthesia Procedure Notes (Signed)
Procedure Name: Intubation Date/Time: 11/28/2020 4:01 PM Performed by: Sherlie Ban, RN Pre-anesthesia Checklist: Patient identified, Emergency Drugs available, Suction available and Patient being monitored Patient Re-evaluated:Patient Re-evaluated prior to induction Oxygen Delivery Method: Circle System Utilized Preoxygenation: Pre-oxygenation with 100% oxygen Induction Type: IV induction Ventilation: Mask ventilation without difficulty Laryngoscope Size: Mac and 4 Grade View: Grade I Tube type: Oral Tube size: 8.5 mm Number of attempts: 1 Airway Equipment and Method: Stylet Placement Confirmation: ETT inserted through vocal cords under direct vision,  positive ETCO2 and breath sounds checked- equal and bilateral Secured at: 23 cm Tube secured with: Tape Dental Injury: Teeth and Oropharynx as per pre-operative assessment

## 2020-11-28 NOTE — Op Note (Signed)
      [address]       Ronald Butler [address]             [phone]        11/28/2020  Patient:  Ronald Butler Pre-Op Dx:  Prolonged air leak   Hx of spontaneous pneumothorax   S/p right robotic assisted thoracoscopy Post-op Dx:  same Procedure: - Bronchosocpy - Endobronchial valve placement X 7.  3 49mm valves, 4 54mm valves in the right lung - Betadine pleurodesis   Surgeon and Role:      * Dashea Mcmullan, Eliezer Lofts, MD - Primary  Anesthesia  general EBL:  Minimal  Blood Administration: none   Indications: 45 year old male who originally presented with a right-sided spontaneous pneumothorax.  At the time of presentation he was also positive for COVID 19.  He underwent a right-sided robotic assisted thoracoscopy with wedge resection and lysis of adhesions, and subsequently had a prolonged air leak.  We have attempted multiple clamping trials without success.  We thus elected to place endobronchial valves. Findings: He had a very small air leak.  Upon intubation and with Valsalva it was difficult to determine the location of the air leak.  We initially started by occluding the basilar segments of the lower lobe but the air leak was persistent.  We then moved to the superior segment and middle lobe segments.  He still had an air leak that was unchanged, and finally we occluded the upper lobe segments.  Operative Technique: All invasive lines were placed in pre-op holding.  After the risks, benefits and alternatives were thoroughly discussed, the patient was brought to the operative theatre.  Anesthesia was induced, and the patient was prepped and draped in normal sterile fashion.  An appropriate surgical pause was performed.  Betadine was injected into the chest tube for the pleurodesis.  This was allowed to dwell during the course of the procedure.  The chest tube was then placed on suction.  There was a 1 column air leak present.  Flexible fiberoptic bronchoscopy  was performed via the endotracheal tube.  It revealed normal endobronchial anatomy with no endobronchial lesions to the level of the subsegmental bronchi.    We began by occluding the right lower lobe lower lobe.  There was no significant change in the air leak.  This was also very difficult to determine given the size of the airleak.  The middle lobe segments were then occluded without significant change, and this was also repeated in the upper lobe without any significant change.  I began by using the sizing balloon and placing endobronchial valves and the lower lobe segments.  There was still a persistent air leak with Valsalva.  I then moved to the middle lobe with the patient continued to have an air leak.  It did appear smaller but was still present.  Finally I occluded the 3 lobar segments of the upper lobe with resolution of the leak.  After deploying the valves and waiting several minutes, the air leak decreased.  Final inspection was made with the bronchoscope and all valves appeared well seated.  There was no significant bleeding.  The bronchoscope was removed.  The patient was extubated in the operating room and taken to the Postanesthetic Care Unit in good condition.    The patient tolerated the procedure without any immediate complications, and was transferred to the PACU in good condition.  Ronald Butler

## 2020-11-28 NOTE — Anesthesia Preprocedure Evaluation (Signed)
Anesthesia Evaluation  Patient identified by MRN, date of birth, ID band Patient awake    Reviewed: Allergy & Precautions, NPO status , Patient's Chart, lab work & pertinent test results  Airway Mallampati: II  TM Distance: >3 FB Neck ROM: Full    Dental no notable dental hx.    Pulmonary Current Smoker,  covid 09/2020   Pulmonary exam normal breath sounds clear to auscultation + decreased breath sounds      Cardiovascular negative cardio ROS Normal cardiovascular exam Rhythm:Regular Rate:Normal     Neuro/Psych negative neurological ROS  negative psych ROS   GI/Hepatic negative GI ROS, (+)     substance abuse  cocaine use,   Endo/Other  negative endocrine ROS  Renal/GU negative Renal ROS  negative genitourinary   Musculoskeletal negative musculoskeletal ROS (+)   Abdominal   Peds negative pediatric ROS (+)  Hematology negative hematology ROS (+)   Anesthesia Other Findings   Reproductive/Obstetrics negative OB ROS                             Anesthesia Physical Anesthesia Plan  ASA: II  Anesthesia Plan: General   Post-op Pain Management:    Induction: Intravenous  PONV Risk Score and Plan: 1 and Ondansetron, Dexamethasone and Treatment may vary due to age or medical condition  Airway Management Planned: Oral ETT  Additional Equipment:   Intra-op Plan:   Post-operative Plan: Extubation in OR  Informed Consent: I have reviewed the patients History and Physical, chart, labs and discussed the procedure including the risks, benefits and alternatives for the proposed anesthesia with the patient or authorized representative who has indicated his/her understanding and acceptance.     Dental advisory given  Plan Discussed with: CRNA and Surgeon  Anesthesia Plan Comments:         Anesthesia Quick Evaluation

## 2020-11-28 NOTE — Transfer of Care (Signed)
Immediate Anesthesia Transfer of Care Note  Patient: Ronald Butler  Procedure(s) Performed: VIDEO BRONCHOSCOPY WITH INSERTION OF INTERBRONCHIAL VALVE (IBV). (N/A )  Patient Location: PACU  Anesthesia Type:General  Level of Consciousness: drowsy, patient cooperative and responds to stimulation  Airway & Oxygen Therapy: Patient Spontanous Breathing  Post-op Assessment: Report given to RN and Post -op Vital signs reviewed and stable  Post vital signs: Reviewed and stable  Last Vitals:  Vitals Value Taken Time  BP    Temp    Pulse 89 11/28/20 1657  Resp 18 11/28/20 1657  SpO2 96 % 11/28/20 1657  Vitals shown include unvalidated device data.  Last Pain:  Vitals:   11/28/20 1326  TempSrc: Oral  PainSc:       Patients Stated Pain Goal: 5 (11/26/20 2058)  Complications: No complications documented.

## 2020-11-28 NOTE — Progress Notes (Signed)
PROGRESS NOTE    Ronald Butler  GQQ:[id number] DOB: 30-Oct-1975 DOA: 10/28/2020 PCP: Patient, No Pcp Per (Inactive)   Brief Narrative: The patient is a 45 year old Caucasian male with a past medical history significant for but not limited to recurrent spontaneous pneumothorax previously refused VATS, polysubstance abuse, seizure disorder as well as other comorbidities who was apprehended by law enforcement and incarcerated and subsequently developed chest pain and shortness of breath and was found to have a right sided pneumothorax.  A chest tube was placed and he was transferred to [name] for cardiothoracic surgery evaluation from [facility].  He subsequently underwent VATS with wedge resection on 10/30/2020. He was also found to be positive for COVID-19 incidentally.  On 11/02/2020 his chest tube was clamped and pneumothorax reoccurred with lung collapse.  Continues to have his chest tube to waterseal and an air leak so cardiothoracic surgery recommending continuing chest tube.  Repeat CXR on 11/16/20 shows "Right chest tube in stable position. Persistent right base pneumothorax." Patient underwent a CT Chest to check for any further bullous disease and it showed "Small residual right basilar pneumothorax also with incomplete re-expansion of the superior segment of the right lower lobe despite well-positioned thoracostomy tube. Evidence of prior surgical resection of pulmonary blebs at the right lung apex and superior segment of the right lower lobe. Three small blebs present along the anterior aspect of the right lung apex. Mild centrilobular pulmonary emphysema."\  Clamping trial attempted 11/17/2020 but resulted in an expanding basilar pneumothorax.  CT surgery now recommending leaving Chest tube to waterseal through the weekend and repeated chest x-Tyray Monday which continues to show trace Right Apical Pneumothorax. CT Surgery recommending leaving to water another clamping trial again  today. They continue to encourage Incentive Spirometry.  He continues to have some nausea and abdominal pain still he was resumed on IV fluids and lab work was obtained.  We will need to continue to treat his pain and if his abdominal pain and nausea worsened we will obtain further imaging.  Assessment & Plan:   Active Problems:   Pneumothorax  Right Sided Pneumothorax s/p multiple Wedge bleb Resections on 10/30/20 Persistent air leak --persistent right basilar pneumothorax.  Patient has failed clamping chest tube twice (increased pneumothorax).  --Pt refused procedure for intrabronchial valve placement scheduled for 5/26. Plan: --intra-bronchial valve placement today. --chest tube management per CT surg  Chest tube pain --had been receiving IV dilaudid q4h for 25 days since presentation, stopped on 5/25 my first day on service.  Since only dilaudid worked for his pain, pain med changed to oral dilaudid, and pt has been using max amount of PRN, and appeared comfortable, although continues to ask other providers for IV dilaudid.   Plan: --Do not order IV dilaudid. --continue oral dilaudid 4 mg q4h PRN --start tapering off opioids after chest tube is removed  Nausea and Vomiting, resolved --Currently tolerating regular diet. --No need for MIVF --zofran PRN  COVID-19 -Incidental finding and Unclear how long he has had this -Observing for now -Off of Isolation -Tested Positive 10/28/20  Seizure disorder --on Depakote, Vimpat and zonisamide --cont current regimen  Current smoker --encouraged cessation on 5/26, pt agreeable  Normocytic Anemia  Folate def -Stable.  Patient's hemoglobin/hematocrit is now 11.2/33.8 on last check (11/18/20) -Checked Anemia Panel as he is anticoagulated with apixaban 2.5 mils p.o. twice daily and it showed an iron level of 66, U IBC of 403, TIBC 469, saturation ratios of 14%, ferritin of  133 and folate of 4.5 as well as vitamin B12 level 356 - No overt  bleeding noted Plan: --cont folic acid supplement (new)   DVT prophylaxis: Anticoagulated with Eliquis 2.5 mg p.o. twice daily Code Status: FULL CODE  Family Communication:   Status is: Inpatient   Dispo: The patient is from: Prison              Anticipated d/c is to: Prison              Patient currently is not medically stable to d/c.  Just post procedure to patch the leak.  CT surgeon to clear for discharge.    Difficult to place patient No  Consultants:   Cardiothoracic Surgery    Procedures: VATS with Wedge Resection  Antimicrobials:  Anti-infectives (From admission, onward)   Start     Dose/Rate Route Frequency Ordered Stop   10/30/20 2130  ceFAZolin (ANCEF) IVPB 2g/100 mL premix        2 g 200 mL/hr over 30 Minutes Intravenous Every 8 hours 10/30/20 1641 10/31/20 0719        Subjective: Pt went for intra-bronchial valve placement today.  Post-procedure, pt complained of a lot of pain.  Pt received Fentanyl 50 x3 doses in PACU, followed by oral dilaudid 8mg  x1 upon arriving back on the floor.   Objective: Vitals:   11/28/20 1655 11/28/20 1725 11/28/20 1740 11/28/20 1802  BP: 113/72 106/81 98/69 101/76  Pulse: 92 89 83 89  Resp: 20 15 12 18   Temp: 98.5 F (36.9 C)   98 F (36.7 C)  TempSrc:    Axillary  SpO2: 97% 94% 96%   Weight:        Intake/Output Summary (Last 24 hours) at 11/28/2020 1918 Last data filed at 11/28/2020 1656 Gross per 24 hour  Intake 850 ml  Output 600 ml  Net 250 ml   Filed Weights   10/28/20 0511 11/06/20 0404 11/13/20 0329  Weight: 67.4 kg 67 kg 67.4 kg    Physical Exam: Constitutional: NAD, AAOx3 HEENT: conjunctivae and lids normal, EOMI CV: No cyanosis.   RESP: normal respiratory effort, on RA, chest tube present Neuro: II - XII grossly intact.   Psych: depressed mood and affect.     Data Reviewed: I have personally reviewed following labs and imaging studies  CBC: Recent Labs  Lab 11/25/20 0030  WBC 5.6   HGB 11.1*  HCT 33.0*  MCV 91.9  PLT 226   Basic Metabolic Panel: Recent Labs  Lab 11/25/20 0030  NA 139  K 3.9  CL 108  CO2 26  GLUCOSE 120*  BUN 12  CREATININE 0.92  CALCIUM 8.9  MG 2.0   GFR: Estimated Creatinine Clearance: 98.7 mL/min (by C-G formula based on SCr of 0.92 mg/dL). Liver Function Tests: No results for input(s): AST, ALT, ALKPHOS, BILITOT, PROT, ALBUMIN in the last 168 hours. No results for input(s): LIPASE, AMYLASE in the last 168 hours. No results for input(s): AMMONIA in the last 168 hours. Coagulation Profile: No results for input(s): INR, PROTIME in the last 168 hours. Cardiac Enzymes: No results for input(s): CKTOTAL, CKMB, CKMBINDEX, TROPONINI in the last 168 hours. BNP (last 3 results) No results for input(s): PROBNP in the last 8760 hours. HbA1C: No results for input(s): HGBA1C in the last 72 hours. CBG: No results for input(s): GLUCAP in the last 168 hours. Lipid Profile: No results for input(s): CHOL, HDL, LDLCALC, TRIG, CHOLHDL, LDLDIRECT in the last 72 hours. Thyroid  Function Tests: No results for input(s): TSH, T4TOTAL, FREET4, T3FREE, THYROIDAB in the last 72 hours. Anemia Panel: No results for input(s): VITAMINB12, FOLATE, FERRITIN, TIBC, IRON, RETICCTPCT in the last 72 hours. Sepsis Labs: No results for input(s): PROCALCITON, LATICACIDVEN in the last 168 hours.  No results found for this or any previous visit (from the past 240 hour(s)).   RN Pressure Injury Documentation:     Estimated body mass index is 20.72 kg/m as calculated from the following:   Height as of 06/19/20: 5\' 11"  (1.803 m).   Weight as of this encounter: 67.4 kg.  Malnutrition Type:   Malnutrition Characteristics:    Nutrition Interventions:   Radiology Studies: DG Chest Port 1 View  Result Date: 11/28/2020 CLINICAL DATA:  Right pneumothorax.  Chest tube. EXAM: PORTABLE CHEST 1 VIEW COMPARISON:  11/27/2020. FINDINGS: Right chest tube in stable  position. Right base pneumothorax has improved with minimal residual. Stable tiny right apical pneumothorax. Lungs are clear. Heart size normal. No displaced rib fracture noted. IMPRESSION: Right chest tube in stable position. Right pneumothorax has improved with minimal residual. Stable tiny right apical pneumothorax. Electronically Signed   By: 11/29/2020  Register   On: 11/28/2020 05:51   DG CHEST PORT 1 VIEW  Result Date: 11/27/2020 CLINICAL DATA:  45 year old male with history of pneumothorax. EXAM: PORTABLE CHEST 1 VIEW COMPARISON:  Chest x-Nishanth 11/26/2020. FINDINGS: Right-sided chest tube remains in position with tip in the apex of the right hemithorax. There continues to be a right sided pneumothorax which is very small in the apex of the right hemithorax, but there is a much larger basilar component on today's examination. No left pneumothorax. Lungs are clear bilaterally. No pleural effusions. No evidence of pulmonary edema. Heart size is normal. Mediastinal contours are within normal limits. IMPRESSION: 1. Enlarging right-sided pneumothorax, predominantly in the base of the right hemithorax, as above. Right chest tube is stable in position. Electronically Signed   By: 11/28/2020 M.D.   On: 11/27/2020 08:14   Scheduled Meds: . acetaminophen  1,000 mg Oral Q8H  . apixaban  2.5 mg Oral BID  . bisacodyl  10 mg Oral Daily  . divalproex  500 mg Oral Daily  . divalproex  750 mg Oral QHS  . feeding supplement  237 mL Oral BID BM  . fentaNYL      . fentaNYL      . ketorolac      . lacosamide  100 mg Oral BID  . polyethylene glycol  17 g Oral Daily  . senna-docusate  1 tablet Oral QHS  . zonisamide  300 mg Oral QHS   Continuous Infusions:   LOS: 31 days   11/29/2020, md [facility] PAGER is on AMION  If 7PM-7AM, please contact night-coverage [email or web address]

## 2020-11-28 NOTE — Progress Notes (Signed)
PT Cancellation Note  Patient Details Name: Ronald Butler MRN: [id number] DOB: September 28, 1975   Cancelled Treatment:    Reason Eval/Treat Not Completed: Other (comment) per RN, patient likely having surgery today. Will f/u after procedure as time/schedule allow.    Madelaine Etienne, DPT, PN1   Supplemental Physical Therapist [facility]    Pager [phone] Acute Rehab Office [phone]

## 2020-11-28 NOTE — Progress Notes (Addendum)
[address]       [place] [address]             [phone]      29 Days Post-Op Procedure(s) (LRB): XI ROBOTIC ASSISTED THORASCOPY-WEDGE RESECTION,UPPER LOBE AND LOWER LOBE (Right) INTERCOSTAL NERVE BLOCK (Right) PLEURECTOMY (Right) Subjective: No specific c/o  Objective: Vital signs in last 24 hours: Temp:  [98.1 F (36.7 C)-98.8 F (37.1 C)] 98.1 F (36.7 C) (05/31 0512) Pulse Rate:  [68-89] 68 (05/31 0512) Resp:  [17-18] 17 (05/31 0512) BP: (88-103)/(58-73) 103/73 (05/31 0512) SpO2:  [98 %-99 %] 99 % (05/31 0512)  Hemodynamic parameters for last 24 hours:    Intake/Output from previous day: 05/30 0701 - 05/31 0700 In: -  Out: 500 [Urine:500] Intake/Output this shift: No intake/output data recorded.  General appearance: alert, cooperative and no distress Heart: regular rate and rhythm Lungs: clear to auscultation bilaterally Abdomen: benign  Lab Results: No results for input(s): WBC, HGB, HCT, PLT in the last 72 hours. BMET: No results for input(s): NA, K, CL, CO2, GLUCOSE, BUN, CREATININE, CALCIUM in the last 72 hours.  PT/INR: No results for input(s): LABPROT, INR in the last 72 hours. ABG No results found for: PHART, HCO3, TCO2, ACIDBASEDEF, O2SAT CBG (last 3)  No results for input(s): GLUCAP in the last 72 hours.  Meds Scheduled Meds: . acetaminophen  1,000 mg Oral Q8H  . apixaban  2.5 mg Oral BID  . bisacodyl  10 mg Oral Daily  . divalproex  500 mg Oral Daily  . divalproex  750 mg Oral QHS  . feeding supplement  237 mL Oral BID BM  . lacosamide  100 mg Oral BID  . polyethylene glycol  17 g Oral Daily  . senna-docusate  1 tablet Oral QHS  . zonisamide  300 mg Oral QHS   Continuous Infusions: PRN Meds:.HYDROmorphone, [DISCONTINUED] ondansetron **OR** ondansetron (ZOFRAN) IV  Xrays DG Chest Port 1 View  Result Date: 11/28/2020 CLINICAL DATA:  Right pneumothorax.  Chest tube. EXAM: PORTABLE CHEST 1 VIEW COMPARISON:   11/27/2020. FINDINGS: Right chest tube in stable position. Right base pneumothorax has improved with minimal residual. Stable tiny right apical pneumothorax. Lungs are clear. Heart size normal. No displaced rib fracture noted. IMPRESSION: Right chest tube in stable position. Right pneumothorax has improved with minimal residual. Stable tiny right apical pneumothorax. Electronically Signed   By: Maisie Fus  Register   On: 11/28/2020 05:51   DG CHEST PORT 1 VIEW  Result Date: 11/27/2020 CLINICAL DATA:  45 year old male with history of pneumothorax. EXAM: PORTABLE CHEST 1 VIEW COMPARISON:  Chest x-Enio 11/26/2020. FINDINGS: Right-sided chest tube remains in position with tip in the apex of the right hemithorax. There continues to be a right sided pneumothorax which is very small in the apex of the right hemithorax, but there is a much larger basilar component on today's examination. No left pneumothorax. Lungs are clear bilaterally. No pleural effusions. No evidence of pulmonary edema. Heart size is normal. Mediastinal contours are within normal limits. IMPRESSION: 1. Enlarging right-sided pneumothorax, predominantly in the base of the right hemithorax, as above. Right chest tube is stable in position. Electronically Signed   By: Trudie Reed M.D.   On: 11/27/2020 08:14    Assessment/Plan: S/P Procedure(s) (LRB): XI ROBOTIC ASSISTED THORASCOPY-WEDGE RESECTION,UPPER LOBE AND LOWER LOBE (Right) INTERCOSTAL NERVE BLOCK (Right) PLEURECTOMY (Right)  1 afeb, sBP 88-103, HR 60's-80's 2 sats good on RA 3 CXR , improved pntx, minimal  residual (base). Minor apical pntx 4 no new labs 5 CT no drainage, mod air leak, cont H2O seal 6 poss IBV's soon   LOS: 31 days    Rowe Clack PA-C Pager [phone] 11/28/2020  Agree with above OR today for EBV  Overton Boggus O Mylin Hirano

## 2020-11-29 ENCOUNTER — Inpatient Hospital Stay (HOSPITAL_COMMUNITY): Payer: Self-pay

## 2020-11-29 LAB — CBC
HCT: 33.9 % — ABNORMAL LOW (ref 39.0–52.0)
Hemoglobin: 11.4 g/dL — ABNORMAL LOW (ref 13.0–17.0)
MCH: 30.6 pg (ref 26.0–34.0)
MCHC: 33.6 g/dL (ref 30.0–36.0)
MCV: 91.1 fL (ref 80.0–100.0)
Platelets: 239 10*3/uL (ref 150–400)
RBC: 3.72 MIL/uL — ABNORMAL LOW (ref 4.22–5.81)
RDW: 13.5 % (ref 11.5–15.5)
WBC: 5.3 10*3/uL (ref 4.0–10.5)
nRBC: 0 % (ref 0.0–0.2)

## 2020-11-29 LAB — BASIC METABOLIC PANEL
Anion gap: 10 (ref 5–15)
BUN: 30 mg/dL — ABNORMAL HIGH (ref 6–20)
CO2: 23 mmol/L (ref 22–32)
Calcium: 9.3 mg/dL (ref 8.9–10.3)
Chloride: 104 mmol/L (ref 98–111)
Creatinine, Ser: 1.18 mg/dL (ref 0.61–1.24)
GFR, Estimated: >60 mL/min (ref >60)
Glucose, Bld: 115 mg/dL — ABNORMAL HIGH (ref 70–99)
Potassium: 4.4 mmol/L (ref 3.5–5.1)
Sodium: 137 mmol/L (ref 135–145)

## 2020-11-29 LAB — MAGNESIUM: Magnesium: 2.1 mg/dL (ref 1.7–2.4)

## 2020-11-29 MED ORDER — PANTOPRAZOLE SODIUM 40 MG PO TBEC
40.0000 mg | DELAYED_RELEASE_TABLET | Freq: Every day | ORAL | Status: DC
  Administered 2020-11-29 – 2020-12-03 (×5): 40 mg via ORAL
  Filled 2020-11-29 (×5): qty 1

## 2020-11-29 MED ORDER — CYCLOBENZAPRINE HCL 5 MG PO TABS
5.0000 mg | ORAL_TABLET | Freq: Three times a day (TID) | ORAL | Status: DC | PRN
  Administered 2020-11-29 – 2020-11-30 (×2): 5 mg via ORAL
  Filled 2020-11-29 (×2): qty 1

## 2020-11-29 MED ORDER — ALBUTEROL SULFATE (2.5 MG/3ML) 0.083% IN NEBU: 2.5000 mg | INHALATION_SOLUTION | RESPIRATORY_TRACT | Status: DC | PRN

## 2020-11-29 MED ORDER — IBUPROFEN 400 MG PO TABS
400.0000 mg | ORAL_TABLET | Freq: Four times a day (QID) | ORAL | Status: DC
  Administered 2020-11-29 – 2020-12-03 (×14): 400 mg via ORAL
  Filled 2020-11-29 (×13): qty 1

## 2020-11-29 MED ORDER — LIDOCAINE 5 % EX PTCH
1.0000 | MEDICATED_PATCH | CUTANEOUS | Status: DC
  Administered 2020-11-30 – 2020-12-02 (×3): 1 via TRANSDERMAL
  Filled 2020-11-29 (×5): qty 1

## 2020-11-29 MED ORDER — HYDROMORPHONE HCL 2 MG PO TABS
4.0000 mg | ORAL_TABLET | Freq: Four times a day (QID) | ORAL | Status: DC | PRN
  Administered 2020-11-29 – 2020-11-30 (×3): 4 mg via ORAL
  Filled 2020-11-29 (×3): qty 2

## 2020-11-29 MED ORDER — HYDROMORPHONE HCL 1 MG/ML IJ SOLN
1.0000 mg | Freq: Once | INTRAMUSCULAR | Status: AC
  Administered 2020-11-29: 1 mg via INTRAVENOUS
  Filled 2020-11-29: qty 1

## 2020-11-29 NOTE — Progress Notes (Addendum)
      [address]       [name] [address]             [phone]      1 Day Post-Op Procedure(s) (LRB): VIDEO BRONCHOSCOPY WITH INSERTION OF INTERBRONCHIAL VALVE (IBV). (N/A) Subjective: In pain this morning, tearful.   Objective: Vital signs in last 24 hours: Temp:  [97.9 F (36.6 C)-98.8 F (37.1 C)] 98.5 F (36.9 C) (06/01 0415) Pulse Rate:  [68-92] 83 (06/01 0415) Cardiac Rhythm: Normal sinus rhythm (05/31 2000) Resp:  [12-20] 18 (06/01 0415) BP: (91-113)/(66-81) 105/72 (06/01 0415) SpO2:  [94 %-99 %] 96 % (06/01 0415)     Intake/Output from previous day: 05/31 0701 - 06/01 0700 In: 850 [I.V.:600; IV Piggyback:250] Out: 1050 [Urine:950; Chest Tube:100] Intake/Output this shift: No intake/output data recorded.  General appearance: alert, cooperative and no distress Heart: regular rate and rhythm, S1, S2 normal, no murmur, click, rub or gallop Lungs: clear to auscultation bilaterally Abdomen: soft, non-tender; bowel sounds normal; no masses,  no organomegaly Extremities: extremities normal, atraumatic, no cyanosis or edema Wound: clean and dry  Lab Results: Recent Labs    11/29/20 0142  WBC 5.3  HGB 11.4*  HCT 33.9*  PLT 239   BMET:  Recent Labs    11/29/20 0142  NA 137  K 4.4  CL 104  CO2 23  GLUCOSE 115*  BUN 30*  CREATININE 1.18  CALCIUM 9.3    PT/INR: No results for input(s): LABPROT, INR in the last 72 hours. ABG No results found for: PHART, HCO3, TCO2, ACIDBASEDEF, O2SAT CBG (last 3)  No results for input(s): GLUCAP in the last 72 hours.  Assessment/Plan: S/P Procedure(s) (LRB): VIDEO BRONCHOSCOPY WITH INSERTION OF INTERBRONCHIAL VALVE (IBV). (N/A)  1. Chest tube in place on suction. No air leak.  2. CXR-right chest tube in stable position, small right apical pneumo again noted. Bronchial valves on the right with mild bibasilar atelectasis. 3. Pain control-not well controlled. He has dilaudid PO 4mg  ordered every 4  hours and Toradol is available but per the patient it burns his veins  Plan: Possible removal of chest tube today, will discuss with Dr. . Otherwise, working on better pain control today. Encouraged to ambulate around his room.    LOS: 32 days    Cliffton Asters 11/29/2020   Agree with above. We will remove chest tube.  Leeanna Slaby 01/29/2021

## 2020-11-29 NOTE — Progress Notes (Addendum)
      [address]       [place] [address]             [phone]       Chest tube removed without issue this morning at 8:45am. The white suture broke when I went to tie it down, so the patient has a xeroform gauze occlusive dressing with 4 x 4 sterile gauze and tape. Patient was in pain before and after the procedure. 1mg  of IV Dilaudid was given right before removal. The patient was starting to feel a little better when I left the room.   Patient is informed to call nursing if he has increased pain or SOB.   , PA-C

## 2020-11-29 NOTE — Progress Notes (Signed)
PROGRESS NOTE    Ronald Butler  ZOX:[id number] DOB: 04-Aug-1975 DOA: 10/28/2020 PCP: Patient, No Pcp Per (Inactive)   Chief Complaint  Patient presents with  . Shortness of Breath    States that it hurts to breathe et that he has [name] collapsed upper right lung   Brief Narrative:  The patient is Ronald Butler 45 year old Caucasian male with Emmerie Battaglia past medical history significant for but not limited to recurrent spontaneous pneumothorax previously refused VATS, polysubstance abuse, seizure disorder as well as other comorbidities who was apprehended by law enforcement and incarcerated and subsequently developed chest pain and shortness of breath and was found to have [name] right sided pneumothorax.  [name] chest tube was placed and he was transferred to [name] for cardiothoracic surgery evaluation from [facility].  He subsequently underwent VATS with wedge resection on 10/30/2020. He was also found to be positive for COVID-19 incidentally.  On 11/02/2020 his chest tube was clamped and pneumothorax reoccurred with lung collapse.  Continues to have his chest tube to waterseal and an air leak so cardiothoracic surgery recommending continuing chest tube.  Repeat CXR on 11/16/20 shows "Right chest tube in stable position. Persistent right base pneumothorax." Patient underwent [name] CT Chest to check for any further bullous disease and it showed "Small residual right basilar pneumothorax also with incomplete re-expansion of the superior segment of the right lower lobe despite well-positioned thoracostomy tube. Evidence of prior surgical resection of pulmonary blebs at the right lung apex and superior segment of the right lower lobe. Three small blebs present along the anterior aspect of the right lung apex. Mild centrilobular pulmonary emphysema."\  Clamping trial attempted 11/17/2020 but resulted in an expanding basilar pneumothorax.    He's now s/p intrabronchial valve placement on 5/26 with chest tube now  removed.  Assessment & Plan:   Active Problems:   Pneumothorax  Right Sided Pneumothorax s/p multiple Wedge bleb Resections on 10/30/20 Persistent air leak --persistent right basilar pneumothorax.  Patienthasfailed clamping chest tubetwice(increased pneumothorax) -- s/p bronchoscopy with endobronchial valve placement and betadine pleurodesis on 5/31 -- CT surgery c/s, appreciate assistance -- s/p chest tube removal this AM, 6/1 -> CXR with small amount of pleural air at right apex and removal of chest tube -- pt c/o SOB, will discuss additional imaging with CT surgery  Chest tube pain --was receiving IV dilaudid q4h for 25 days since presentation, stopped on 5/25 my first day on service.  Since only dilaudid worked for his pain, pain med changed to oral dilaudid, and pt has been using max amount of PRN, and appeared comfortable, although continues to ask other providers for IV dilaudid.   Plan: --Do not order IV dilaudid. --continue oral dilaudid 4 mg q4h PRN --will need taper when able, continues to c/o SOB/discomfort today, will hold off for now  Nausea and Vomiting, resolved --Currently tolerating regular diet. --No need for MIVF --zofran PRN  COVID-19 -Incidental finding and Unclear how long he has had this -Observing for now -Off of Isolation -Tested Positive 10/28/20  Seizure disorder --on Depakote, Vimpat and zonisamide --cont current regimen  Current smoker --encouraged cessation on 5/26, pt agreeable  Normocytic Anemia  Folate def -Stable.   -anemia labs c/w folate deficiency --cont folic acid supplement (new)  On eliquis, unclear indication? Will discuss with pharmacy  DVT prophylaxis: SCD, eliquis  Code Status: full  Family Communication: none at bedside Disposition:   Status is: Inpatient  Remains inpatient appropriate because:Inpatient level of care appropriate due  to severity of illness   Dispo: The patient is from: Home               Anticipated d/c is to: Home              Patient currently is not medically stable to d/c.   Difficult to place patient No       Consultants:   CT surgery  Procedures:  5/31 Bronchoscopy, endobronchial valve placement, betadine pleurodesis  5/2 Robotic assisted right video thoracoscopy, extensive lysis of adhesion, whed resection of right upper and lower lobes, apical pleurectomy, mechanical pleurodesis, intercostal nerve block  4/30 Chest tube insertion in ED  Antimicrobials: Anti-infectives (From admission, onward)   Start     Dose/Rate Route Frequency Ordered Stop   10/30/20 2130  ceFAZolin (ANCEF) IVPB 2g/100 mL premix        2 g 200 mL/hr over 30 Minutes Intravenous Every 8 hours 10/30/20 1641 10/31/20 0719         Subjective: Feels short of breath   Objective: Vitals:   11/28/20 1802 11/28/20 2015 11/29/20 0415 11/29/20 1246  BP: 101/76 91/66 105/72 112/71  Pulse: 89 89 83 84  Resp: 18 18 18 18   Temp: 98 F (36.7 C) 98.8 F (37.1 C) 98.5 F (36.9 C) 97.7 F (36.5 C)  TempSrc: Axillary Oral Oral Oral  SpO2:  95% 96% 95%  Weight:        Intake/Output Summary (Last 24 hours) at 11/29/2020 1616 Last data filed at 11/29/2020 1300 Gross per 24 hour  Intake 1330 ml  Output 1050 ml  Net 280 ml   Filed Weights   10/28/20 0511 11/06/20 0404 11/13/20 0329  Weight: 67.4 kg 67 kg 67.4 kg    Examination:  General exam: Appears calm and comfortable  Respiratory system: diminished on R, dresing in place Cardiovascular system: S1 & S2 heard, RRR.  Gastrointestinal system: Abdomen is nondistended, soft and nontender Central nervous system: Alert and oriented. No focal neurological deficits. Extremities: no lee  Skin: No rashes, lesions or ulcers Psychiatry: Judgement and insight appear normal. Mood & affect appropriate.     Data Reviewed: I have personally reviewed following labs and imaging studies  CBC: Recent Labs  Lab 11/25/20 0030 11/29/20 0142   WBC 5.6 5.3  HGB 11.1* 11.4*  HCT 33.0* 33.9*  MCV 91.9 91.1  PLT 226 239    Basic Metabolic Panel: Recent Labs  Lab 11/25/20 0030 11/29/20 0142  NA 139 137  K 3.9 4.4  CL 108 104  CO2 26 23  GLUCOSE 120* 115*  BUN 12 30*  CREATININE 0.92 1.18  CALCIUM 8.9 9.3  MG 2.0 2.1    GFR: Estimated Creatinine Clearance: 77 mL/min (by C-G formula based on SCr of 1.18 mg/dL).  Liver Function Tests: No results for input(s): AST, ALT, ALKPHOS, BILITOT, PROT, ALBUMIN in the last 168 hours.  CBG: No results for input(s): GLUCAP in the last 168 hours.   No results found for this or any previous visit (from the past 240 hour(s)).       Radiology Studies: DG CHEST PORT 1 VIEW  Result Date: 11/29/2020 CLINICAL DATA:  Shortness of breath.  Right chest tube removal. EXAM: PORTABLE CHEST 1 VIEW COMPARISON:  Earlier same day FINDINGS: Right chest tube is been removed. Pleural chest tube track remains visible as is often seen. Tiny amount of pleural air at the right apex is not increased. The right lung remains well aerated. Multiple airway  valves redemonstrated. Left chest remains clear. IMPRESSION: No change other than removal of the right chest tube. Small amount of pleural air persists at the right apex. Electronically Signed   By: Paulina Fusi M.D.   On: 11/29/2020 11:06   DG Chest Port 1 View  Result Date: 11/29/2020 CLINICAL DATA:  Chest tube.  Bronchial valve placement. EXAM: PORTABLE CHEST 1 VIEW COMPARISON:  11/27/2020. FINDINGS: Right chest tube in stable position. Stable small right apical pneumothorax again noted. Bronchial valves noted on the right. No focal infiltrate. Mild bibasilar subsegmental atelectasis. Heart size normal. Prior cervical spine fusion. Surgical clips right upper quadrant. IMPRESSION: 1. Right chest tube in stable position. Small right apical pneumothorax again noted. 2. Bronchial valves noted the right. Mild bibasilar subsegmental atelectasis. Electronically  Signed   By: Maisie Fus  Register   On: 11/29/2020 07:05   DG Chest Port 1 View  Result Date: 11/28/2020 CLINICAL DATA:  Right pneumothorax.  Chest tube. EXAM: PORTABLE CHEST 1 VIEW COMPARISON:  11/27/2020. FINDINGS: Right chest tube in stable position. Right base pneumothorax has improved with minimal residual. Stable tiny right apical pneumothorax. Lungs are clear. Heart size normal. No displaced rib fracture noted. IMPRESSION: Right chest tube in stable position. Right pneumothorax has improved with minimal residual. Stable tiny right apical pneumothorax. Electronically Signed   By: Maisie Fus  Register   On: 11/28/2020 05:51        Scheduled Meds: . acetaminophen  1,000 mg Oral Q8H  . apixaban  2.5 mg Oral BID  . bisacodyl  10 mg Oral Daily  . divalproex  500 mg Oral Daily  . divalproex  750 mg Oral QHS  . feeding supplement  237 mL Oral BID BM  . lacosamide  100 mg Oral BID  . polyethylene glycol  17 g Oral Daily  . senna-docusate  1 tablet Oral QHS  . zonisamide  300 mg Oral QHS   Continuous Infusions:   LOS: 32 days    Time spent: over 30 min    Lacretia Nicks, MD [facility]   To contact the attending provider between 7A-7P or the covering provider during after hours 7P-7A, please log into the web site [email or web address] and access using universal [place] password for that web site. If you do not have the password, please call the hospital operator.  11/29/2020, 4:16 PM

## 2020-11-29 NOTE — Progress Notes (Signed)
Physical Therapy Treatment Patient Details Name: Ronald Butler MRN: [id number] DOB: 11-Feb-1976 Today's Date: 11/29/2020    History of Present Illness 45 yo male adm 4/09 from police custody with SOB and chest pain. 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  Found incidental Covid. On 5/2 pt had wedge resection of rt upper and lower lobes, apical pleurectomy, and mechanical pleurodesis. Chest tube in place. Received video bronchoscopy with IBV placement on 11/28/20, then chest tube was removed on 11/29/20.   PMHx: pancreatitis, recurrent pneumothorax, seizures, cholecystectomy, smoker, THC    PT Comments    Patient received in bed continuing to report high pain levels, but able to convince him to participate in PT with max encouragement. Needed increased levels of physical assist today due to pain, and only able to tolerate very short distance ambulation in room due to pain. Noted increased wheezing and breath sounds, however unable to get quality pleth until return to bed where SpO2 read at 99-100%. Left in bed with all needs met, RN attending and police officer outside of room. Will re-attempt increasing frequency for PT now that chest tube has been removed.     Follow Up Recommendations  Supervision - Intermittent     Equipment Recommendations  Rolling walker with 5" wheels    Recommendations for Other Services       Precautions / Restrictions Precautions Precautions: Fall;Other (comment) Precaution Comments: chest tube now removed but still has high pain levels Restrictions Weight Bearing Restrictions: No    Mobility  Bed Mobility Overal bed mobility: Needs Assistance Bed Mobility: Supine to Sit;Sit to Supine     Supine to sit: Min assist;HOB elevated Sit to supine: Mod assist;HOB elevated   General bed mobility comments: needed Min-ModA for BLE management and to bring trunk up to midline sitting due to pain today, increased time and effort     Transfers Overall transfer level: Needs assistance Equipment used: Rolling walker (2 wheeled) Transfers: Sit to/from Stand Sit to Stand: Min assist         General transfer comment: MinA to boost to upright standing, increased time and effort, very guarded and antalgic  Ambulation/Gait Ambulation/Gait assistance: Min assist Gait Distance (Feet): 6 Feet (75f forward, 337fbackward) Assistive device: Rolling walker (2 wheeled) Gait Pattern/deviations: Step-to pattern;Decreased step length - right;Decreased step length - left;Trunk flexed;Narrow base of support Gait velocity: significantly reduced   General Gait Details: extremely slow and guarded, tremulous due to pain- only able to tolerate short distance ambulation in room before needed to return to bed due to pain.   Stairs             Wheelchair Mobility    Modified Rankin (Stroke Patients Only)       Balance Overall balance assessment: Mild deficits observed, not formally tested Sitting-balance support: Feet supported Sitting balance-Leahy Scale: Normal     Standing balance support: Bilateral upper extremity supported;During functional activity Standing balance-Leahy Scale: Fair Standing balance comment: reliant on BUE support                            Cognition Arousal/Alertness: Awake/alert Behavior During Therapy: Flat affect Overall Cognitive Status: Within Functional Limits for tasks assessed                                 General Comments: cooperative but still pain limited  Exercises      General Comments General comments (skin integrity, edema, etc.): noted increased wheezing and general breath sounds with activity; did not have reliable pleth with activity, but eventually able to get a reading of 99-100%. Unsure if he desaturated with gait.      Pertinent Vitals/Pain Pain Assessment: Faces Faces Pain Scale: Hurts worst Pain Location: R chest wall Pain  Descriptors / Indicators: Grimacing;Guarding;Aching;Moaning Pain Intervention(s): Limited activity within patient's tolerance;Monitored during session    Home Living                      Prior Function            PT Goals (current goals can now be found in the care plan section) Acute Rehab PT Goals Patient Stated Goal: to get pain managed PT Goal Formulation: With patient Time For Goal Achievement: 12/13/20 Potential to Achieve Goals: Fair Progress towards PT goals: Not progressing toward goals - comment (pain limited)    Frequency    Min 2X/week      PT Plan Frequency needs to be updated    Co-evaluation              AM-PAC PT "6 Clicks" Mobility   Outcome Measure  Help needed turning from your back to your side while in a flat bed without using bedrails?: A Little Help needed moving from lying on your back to sitting on the side of a flat bed without using bedrails?: A Little Help needed moving to and from a bed to a chair (including a wheelchair)?: A Little Help needed standing up from a chair using your arms (e.g., wheelchair or bedside chair)?: A Little Help needed to walk in hospital room?: A Lot Help needed climbing 3-5 steps with a railing? : Total 6 Click Score: 15    End of Session   Activity Tolerance: Patient limited by pain Patient left: in bed;with call bell/phone within reach Nurse Communication: Mobility status PT Visit Diagnosis: Difficulty in walking, not elsewhere classified (R26.2);Pain Pain - Right/Left: Right Pain - part of body:  (chest wall)     Time: 5974-7185 PT Time Calculation (min) (ACUTE ONLY): 12 min  Charges:  $Therapeutic Activity: 8-22 mins                     Windell Norfolk, DPT, PN1   Supplemental Physical Therapist [place]    Pager [phone] Acute Rehab Office [phone]

## 2020-11-30 ENCOUNTER — Inpatient Hospital Stay (HOSPITAL_COMMUNITY): Payer: Self-pay

## 2020-11-30 ENCOUNTER — Encounter (HOSPITAL_COMMUNITY): Payer: Self-pay | Admitting: Thoracic Surgery (Cardiothoracic Vascular Surgery)

## 2020-11-30 LAB — CBC
HCT: 35.3 % — ABNORMAL LOW (ref 39.0–52.0)
Hemoglobin: 11.5 g/dL — ABNORMAL LOW (ref 13.0–17.0)
MCH: 30.3 pg (ref 26.0–34.0)
MCHC: 32.6 g/dL (ref 30.0–36.0)
MCV: 93.1 fL (ref 80.0–100.0)
Platelets: 214 10*3/uL (ref 150–400)
RBC: 3.79 MIL/uL — ABNORMAL LOW (ref 4.22–5.81)
RDW: 14.1 % (ref 11.5–15.5)
WBC: 7.3 10*3/uL (ref 4.0–10.5)
nRBC: 0 % (ref 0.0–0.2)

## 2020-11-30 LAB — BASIC METABOLIC PANEL
Anion gap: 6 (ref 5–15)
BUN: 26 mg/dL — ABNORMAL HIGH (ref 6–20)
CO2: 24 mmol/L (ref 22–32)
Calcium: 8.8 mg/dL — ABNORMAL LOW (ref 8.9–10.3)
Chloride: 108 mmol/L (ref 98–111)
Creatinine, Ser: 1 mg/dL (ref 0.61–1.24)
GFR, Estimated: >60 mL/min (ref >60)
Glucose, Bld: 89 mg/dL (ref 70–99)
Potassium: 5.3 mmol/L — ABNORMAL HIGH (ref 3.5–5.1)
Sodium: 138 mmol/L (ref 135–145)

## 2020-11-30 LAB — MAGNESIUM: Magnesium: 1.8 mg/dL (ref 1.7–2.4)

## 2020-11-30 MED ORDER — HYDROMORPHONE HCL 2 MG PO TABS
4.0000 mg | ORAL_TABLET | Freq: Three times a day (TID) | ORAL | Status: DC | PRN
  Administered 2020-11-30 – 2020-12-01 (×3): 4 mg via ORAL
  Filled 2020-11-30 (×3): qty 2

## 2020-11-30 NOTE — Progress Notes (Addendum)
PROGRESS NOTE    Ronald Butler  HLK:[id number] DOB: 01-Jun-1976 DOA: 10/28/2020 PCP: Patient, No Pcp Per (Inactive)   Chief Complaint  Patient presents with  . Shortness of Breath    States that it hurts to breathe et that he has Lathon Adan collapsed upper right lung   Brief Narrative:  The patient is Ronald Butler 45 year old Caucasian male with Ronald Butler past medical history significant for but not limited to recurrent spontaneous pneumothorax previously refused VATS, polysubstance abuse, seizure disorder as well as other comorbidities who was apprehended by law enforcement and incarcerated and subsequently developed chest pain and shortness of breath and was found to have Ronald Butler right sided pneumothorax.  Ronald Butler chest tube was placed and he was transferred to [name] for cardiothoracic surgery evaluation from [facility].  He subsequently underwent VATS with wedge resection on 10/30/2020. He was also found to be positive for COVID-19 incidentally.  On 11/02/2020 his chest tube was clamped and pneumothorax reoccurred with lung collapse.  Continues to have his chest tube to waterseal and an air leak so cardiothoracic surgery recommending continuing chest tube.  Repeat CXR on 11/16/20 shows "Right chest tube in stable position. Persistent right base pneumothorax." Patient underwent Ronald Butler CT Chest to check for any further bullous disease and it showed "Small residual right basilar pneumothorax also with incomplete re-expansion of the superior segment of the right lower lobe despite well-positioned thoracostomy tube. Evidence of prior surgical resection of pulmonary blebs at the right lung apex and superior segment of the right lower lobe. Three small blebs present along the anterior aspect of the right lung apex. Mild centrilobular pulmonary emphysema."\  Clamping trial attempted 11/17/2020 but resulted in an expanding basilar pneumothorax.    He's now s/p intrabronchial valve placement on 5/31 with chest tube now  removed.  Assessment & Plan:   Active Problems:   Pneumothorax  Right Sided Pneumothorax s/p multiple Wedge bleb Resections on 10/30/20 Persistent air leak -- s/p robatic assisted right video thoracoscopy, extensive lysis of adhesions, wedge resection of the right upper and lower lobes, apical pleurectomy, mechanical pleurodesis and intercostal nerve block on 5/2 --persistent right basilar pneumothorax.  Patienthasfailed clamping chest tubetwice(increased pneumothorax) -- s/p bronchoscopy with endobronchial valve placement and betadine pleurodesis on 5/31 -- CXR with unchanged small R apical pneumothorax today - per CT surgery, stable for discharge - has follow up appointment -- CT surgery c/s, appreciate assistance -- s/p chest tube removal 6/1  Chest tube pain  Chronic Opiate use --was receiving IV dilaudid q4h for 25 days since presentation, stopped on 5/25 and transitioned to PO dilaudid --weaning dilaudid --will continue to wean opiates over next couple of days (he's been on high dose opiates over past several weeks and won't get opiates in the prison, high risk of withdrawal), if does well, hopefully can wean down quickly and avoid withdrawal after discharge  Nausea and Vomiting, resolved --Currently tolerating regular diet. --No need for MIVF --zofran PRN  COVID-19 -Incidental finding and Unclear how long he has had this -Observing for now -Off of Isolation -Tested Positive 10/28/20  Seizure disorder --on Depakote, Vimpat and zonisamide --cont current regimen  Current smoker --encouraged cessation on 5/26, pt agreeable  Normocytic Anemia  Folate def -Stable.   -anemia labs c/w folate deficiency --cont folic acid supplement (new)  On eliquis for DVT ppx, was refusiing lovenox  DVT prophylaxis: SCD, eliquis  Code Status: full  Family Communication: none at bedside Disposition:   Status is: Inpatient  Remains inpatient  appropriate because:Inpatient  level of care appropriate due to severity of illness   Dispo: The patient is from: Home              Anticipated d/c is to: Home              Patient currently is not medically stable to d/c.   Difficult to place patient No       Consultants:   CT surgery  Procedures:  5/31 Bronchoscopy, endobronchial valve placement, betadine pleurodesis  5/2 Robotic assisted right video thoracoscopy, extensive lysis of adhesion, whed resection of right upper and lower lobes, apical pleurectomy, mechanical pleurodesis, intercostal nerve block  4/30 Chest tube insertion in ED  Antimicrobials: Anti-infectives (From admission, onward)   Start     Dose/Rate Route Frequency Ordered Stop   10/30/20 2130  ceFAZolin (ANCEF) IVPB 2g/100 mL premix        2 g 200 mL/hr over 30 Minutes Intravenous Every 8 hours 10/30/20 1641 10/31/20 0719         Subjective: C/o sob - he's frustrated about communication  Worried about his breathing (asked CT surgery to review expectations post op with him)  Objective: Vitals:   11/29/20 1246 11/29/20 1949 11/30/20 0402 11/30/20 1318  BP: 112/71 108/67 110/70 (!) 88/58  Pulse: 84 96 81 87  Resp: 18 18 17 18   Temp: 97.7 F (36.5 C) 98.7 F (37.1 C) 98.7 F (37.1 C) 98 F (36.7 C)  TempSrc: Oral Oral Oral Oral  SpO2: 95% 98% 100% 96%  Weight:        Intake/Output Summary (Last 24 hours) at 11/30/2020 1542 Last data filed at 11/30/2020 0930 Gross per 24 hour  Intake 480 ml  Output 600 ml  Net -120 ml   Filed Weights   10/28/20 0511 11/06/20 0404 11/13/20 0329  Weight: 67.4 kg 67 kg 67.4 kg    Examination:  General: No acute distress. Cardiovascular: Heart sounds show Ronald Butler regular rate, and rhythm. Lungs: diminished on R, dressing on R Abdomen: Soft, nontender, nondistended  Neurological: Alert and oriented 3. Moves all extremities 4. Cranial nerves II through XII grossly intact. Skin: Warm and dry. No rashes or lesions. Extremities: No clubbing  or cyanosis. No edema.   Data Reviewed: I have personally reviewed following labs and imaging studies  CBC: Recent Labs  Lab 11/25/20 0030 11/29/20 0142 11/30/20 0043  WBC 5.6 5.3 7.3  HGB 11.1* 11.4* 11.5*  HCT 33.0* 33.9* 35.3*  MCV 91.9 91.1 93.1  PLT 226 239 214    Basic Metabolic Panel: Recent Labs  Lab 11/25/20 0030 11/29/20 0142 11/30/20 0606  NA 139 137 138  K 3.9 4.4 5.3*  CL 108 104 108  CO2 26 23 24   GLUCOSE 120* 115* 89  BUN 12 30* 26*  CREATININE 0.92 1.18 1.00  CALCIUM 8.9 9.3 8.8*  MG 2.0 2.1 1.8    GFR: Estimated Creatinine Clearance: 90.8 mL/min (by C-G formula based on SCr of 1 mg/dL).  Liver Function Tests: No results for input(s): AST, ALT, ALKPHOS, BILITOT, PROT, ALBUMIN in the last 168 hours.  CBG: No results for input(s): GLUCAP in the last 168 hours.   No results found for this or any previous visit (from the past 240 hour(s)).       Radiology Studies: DG CHEST PORT 1 VIEW  Result Date: 11/30/2020 CLINICAL DATA:  Chest tube removal EXAM: PORTABLE CHEST 1 VIEW COMPARISON:  11/29/2020 FINDINGS: Right upper lobectomy. Multiple endobronchial valves on  the right. Small right apical pneumothorax approximately 10 mm in thickness. No significant interval change. No pleural effusion. Lungs are well aerated and clear. IMPRESSION: Small right apical pneumothorax unchanged. Electronically Signed   By: Marlan Palau M.D.   On: 11/30/2020 10:16   DG CHEST PORT 1 VIEW  Result Date: 11/29/2020 CLINICAL DATA:  Shortness of breath.  Right chest tube removal. EXAM: PORTABLE CHEST 1 VIEW COMPARISON:  Earlier same day FINDINGS: Right chest tube is been removed. Pleural chest tube track remains visible as is often seen. Tiny amount of pleural air at the right apex is not increased. The right lung remains well aerated. Multiple airway valves redemonstrated. Left chest remains clear. IMPRESSION: No change other than removal of the right chest tube. Small  amount of pleural air persists at the right apex. Electronically Signed   By: Paulina Fusi M.D.   On: 11/29/2020 11:06   DG Chest Port 1 View  Result Date: 11/29/2020 CLINICAL DATA:  Chest tube.  Bronchial valve placement. EXAM: PORTABLE CHEST 1 VIEW COMPARISON:  11/27/2020. FINDINGS: Right chest tube in stable position. Stable small right apical pneumothorax again noted. Bronchial valves noted on the right. No focal infiltrate. Mild bibasilar subsegmental atelectasis. Heart size normal. Prior cervical spine fusion. Surgical clips right upper quadrant. IMPRESSION: 1. Right chest tube in stable position. Small right apical pneumothorax again noted. 2. Bronchial valves noted the right. Mild bibasilar subsegmental atelectasis. Electronically Signed   By: Maisie Fus  Register   On: 11/29/2020 07:05        Scheduled Meds: . acetaminophen  1,000 mg Oral Q8H  . apixaban  2.5 mg Oral BID  . bisacodyl  10 mg Oral Daily  . divalproex  500 mg Oral Daily  . divalproex  750 mg Oral QHS  . feeding supplement  237 mL Oral BID BM  . ibuprofen  400 mg Oral QID  . lacosamide  100 mg Oral BID  . lidocaine  1 patch Transdermal Q24H  . pantoprazole  40 mg Oral Daily  . polyethylene glycol  17 g Oral Daily  . senna-docusate  1 tablet Oral QHS  . zonisamide  300 mg Oral QHS   Continuous Infusions:   LOS: 33 days    Time spent: over 30 min    Lacretia Nicks, MD [facility]   To contact the attending provider between 7A-7P or the covering provider during after hours 7P-7A, please log into the web site [email or web address] and access using universal [place] password for that web site. If you do not have the password, please call the hospital operator.  11/30/2020, 3:42 PM

## 2020-11-30 NOTE — Progress Notes (Signed)
      [address]       [name] [address]             [phone]     Reviewed instructions and post-op expectations with patient. He appears to have a good understanding of these.  Rowe Clack, PA-C

## 2020-11-30 NOTE — Progress Notes (Addendum)
[address]       [place] [id number]             [phone]      2 Days Post-Op Procedure(s) (LRB): VIDEO BRONCHOSCOPY WITH INSERTION OF INTERBRONCHIAL VALVE (IBV). (N/A) Subjective: Breathing comfortably  Objective: Vital signs in last 24 hours: Temp:  [97.7 F (36.5 C)-98.7 F (37.1 C)] 98.7 F (37.1 C) (06/02 0402) Pulse Rate:  [81-96] 81 (06/02 0402) Resp:  [17-18] 17 (06/02 0402) BP: (108-112)/(67-71) 110/70 (06/02 0402) SpO2:  [95 %-100 %] 100 % (06/02 0402)  Hemodynamic parameters for last 24 hours:    Intake/Output from previous day: 06/01 0701 - 06/02 0700 In: 720 [P.O.:720] Out: 600 [Urine:600] Intake/Output this shift: No intake/output data recorded.  General appearance: alert, cooperative and no distress Heart: regular rate and rhythm Lungs: clear to auscultation bilaterally and EWOB Abdomen: benign  Lab Results: Recent Labs    11/29/20 0142 11/30/20 0043  WBC 5.3 7.3  HGB 11.4* 11.5*  HCT 33.9* 35.3*  PLT 239 214   BMET:  Recent Labs    11/29/20 0142 11/30/20 0606  NA 137 138  K 4.4 5.3*  CL 104 108  CO2 23 24  GLUCOSE 115* 89  BUN 30* 26*  CREATININE 1.18 1.00  CALCIUM 9.3 8.8*    PT/INR: No results for input(s): LABPROT, INR in the last 72 hours. ABG No results found for: PHART, HCO3, TCO2, ACIDBASEDEF, O2SAT CBG (last 3)  No results for input(s): GLUCAP in the last 72 hours.  Meds Scheduled Meds: . acetaminophen  1,000 mg Oral Q8H  . apixaban  2.5 mg Oral BID  . bisacodyl  10 mg Oral Daily  . divalproex  500 mg Oral Daily  . divalproex  750 mg Oral QHS  . feeding supplement  237 mL Oral BID BM  . ibuprofen  400 mg Oral QID  . lacosamide  100 mg Oral BID  . lidocaine  1 patch Transdermal Q24H  . pantoprazole  40 mg Oral Daily  . polyethylene glycol  17 g Oral Daily  . senna-docusate  1 tablet Oral QHS  . zonisamide  300 mg Oral QHS   Continuous Infusions: PRN Meds:.albuterol, cyclobenzaprine,  HYDROmorphone, [DISCONTINUED] ondansetron **OR** ondansetron (ZOFRAN) IV  Xrays DG CHEST PORT 1 VIEW  Result Date: 11/30/2020 CLINICAL DATA:  Chest tube removal EXAM: PORTABLE CHEST 1 VIEW COMPARISON:  11/29/2020 FINDINGS: Right upper lobectomy. Multiple endobronchial valves on the right. Small right apical pneumothorax approximately 10 mm in thickness. No significant interval change. No pleural effusion. Lungs are well aerated and clear. IMPRESSION: Small right apical pneumothorax unchanged. Electronically Signed   By: Marlan Palau M.D.   On: 11/30/2020 10:16   DG CHEST PORT 1 VIEW  Result Date: 11/29/2020 CLINICAL DATA:  Shortness of breath.  Right chest tube removal. EXAM: PORTABLE CHEST 1 VIEW COMPARISON:  Earlier same day FINDINGS: Right chest tube is been removed. Pleural chest tube track remains visible as is often seen. Tiny amount of pleural air at the right apex is not increased. The right lung remains well aerated. Multiple airway valves redemonstrated. Left chest remains clear. IMPRESSION: No change other than removal of the right chest tube. Small amount of pleural air persists at the right apex. Electronically Signed   By: Paulina Fusi M.D.   On: 11/29/2020 11:06   DG Chest Port 1 View  Result Date: 11/29/2020 CLINICAL DATA:  Chest tube.  Bronchial valve placement.  EXAM: PORTABLE CHEST 1 VIEW COMPARISON:  11/27/2020. FINDINGS: Right chest tube in stable position. Stable small right apical pneumothorax again noted. Bronchial valves noted on the right. No focal infiltrate. Mild bibasilar subsegmental atelectasis. Heart size normal. Prior cervical spine fusion. Surgical clips right upper quadrant. IMPRESSION: 1. Right chest tube in stable position. Small right apical pneumothorax again noted. 2. Bronchial valves noted the right. Mild bibasilar subsegmental atelectasis. Electronically Signed   By: Maisie Fus  Register   On: 11/29/2020 07:05    Assessment/Plan: S/P Procedure(s) (LRB): VIDEO  BRONCHOSCOPY WITH INSERTION OF INTERBRONCHIAL VALVE (IBV). (N/A)   1 afeb, VSS 2 sats good on RA 3 CXR removed yesterday, will order a CXR, if no change can be d/c's from surgery perspective- has f/u appt , IBV's typically stay in place at least 30 days   Addendum: CXR very stable with tube out, tiny pntx apically  LOS: 33 days    Rowe Clack PA-C Pager [phone] 11/30/2020  Agree with above. Cleared for discharge from surgical standpoint.  Frazier Balfour Keane Scrape

## 2020-12-01 ENCOUNTER — Telehealth: Payer: Self-pay | Admitting: Thoracic Surgery (Cardiothoracic Vascular Surgery)

## 2020-12-01 MED ORDER — HYDROMORPHONE HCL 2 MG PO TABS
2.0000 mg | ORAL_TABLET | Freq: Three times a day (TID) | ORAL | Status: DC | PRN
  Administered 2020-12-01 – 2020-12-02 (×2): 2 mg via ORAL
  Filled 2020-12-01 (×2): qty 1

## 2020-12-01 NOTE — Progress Notes (Signed)
Physical Therapy Treatment Patient Details Name: Ronald Butler MRN: [id number] DOB: 04-26-1976 Today's Date: 12/01/2020    History of Present Illness 45 yo male adm 2/77 from police custody with SOB and chest pain. 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  Found incidental Covid. On 5/2 pt had wedge resection of rt upper and lower lobes, apical pleurectomy, and mechanical pleurodesis. Chest tube in place. Received video bronchoscopy with IBV placement on 11/28/20, then chest tube was removed on 11/29/20.   PMHx: pancreatitis, recurrent pneumothorax, seizures, cholecystectomy, smoker, THC    PT Comments    Patient received in bed, agreeable to PT session. Refused gait belt and also refused to try mobility without RW. Per MD and police officer, likely will not be able to take RW when he goes back to prison. Was able to progress gait distance today, but very effortful and needed to take multiple standing rest breaks. Reported pain levels not always consistent with behaviors/non-verbal communication. Left in bed with all needs met, bed alarm active and police officer outside of the door. Educated that PT will return tomorrow to try walking without RW.     Follow Up Recommendations  Supervision - Intermittent     Equipment Recommendations  Rolling walker with 5" wheels    Recommendations for Other Services       Precautions / Restrictions Precautions Precautions: Fall;Other (comment) Precaution Comments: chest tube now removed but still has high pain levels Restrictions Weight Bearing Restrictions: No    Mobility  Bed Mobility   Bed Mobility: Supine to Sit;Sit to Supine     Supine to sit: Modified independent (Device/Increase time);HOB elevated Sit to supine: Modified independent (Device/Increase time);HOB elevated   General bed mobility comments: increased time and effort    Transfers Overall transfer level: Needs assistance Equipment used: Rolling walker (2  wheeled) Transfers: Sit to/from Stand Sit to Stand: Min guard         General transfer comment: min guard, increased time and effort. Refused to attempt without RW  Ambulation/Gait Ambulation/Gait assistance: Min guard Gait Distance (Feet): 16 Feet Assistive device: Rolling walker (2 wheeled) Gait Pattern/deviations: Step-to pattern;Decreased step length - right;Decreased step length - left;Trunk flexed;Narrow base of support Gait velocity: significantly reduced   General Gait Details: slow and guarded, taking frequent standing rest breaks with RW. Only able to tolerate short distance ambulation. When cued to perform PLB or try gait without RW, stated "do you only have one lung?" or was limited by R chest wall pain   Stairs             Wheelchair Mobility    Modified Rankin (Stroke Patients Only)       Balance Overall balance assessment: Mild deficits observed, not formally tested Sitting-balance support: Feet supported Sitting balance-Leahy Scale: Normal     Standing balance support: Bilateral upper extremity supported;During functional activity Standing balance-Leahy Scale: Fair Standing balance comment: reliant on BUE support                            Cognition Arousal/Alertness: Awake/alert Behavior During Therapy: Flat affect Overall Cognitive Status: Within Functional Limits for tasks assessed                                 General Comments: cooperative, behaviors and non-verbal communication not always consistent with reported pain level  Exercises      General Comments General comments (skin integrity, edema, etc.): SOB with activity but per MD after valve procedure this is expected. vitals have always been stable on room air per nursing/based on previous sessions      Pertinent Vitals/Pain Pain Assessment: Faces Faces Pain Scale: Hurts little more Pain Location: R chest wall/abdomen Pain Descriptors / Indicators:  Aching;Grimacing;Guarding Pain Intervention(s): Limited activity within patient's tolerance;Monitored during session    Home Living                      Prior Function            PT Goals (current goals can now be found in the care plan section) Acute Rehab PT Goals Patient Stated Goal: to get pain managed PT Goal Formulation: With patient Time For Goal Achievement: 12/13/20 Potential to Achieve Goals: Fair Progress towards PT goals: Progressing toward goals    Frequency    Min 2X/week      PT Plan Current plan remains appropriate    Co-evaluation              AM-PAC PT "6 Clicks" Mobility   Outcome Measure  Help needed turning from your back to your side while in a flat bed without using bedrails?: None Help needed moving from lying on your back to sitting on the side of a flat bed without using bedrails?: A Little Help needed moving to and from a bed to a chair (including a wheelchair)?: A Little Help needed standing up from a chair using your arms (e.g., wheelchair or bedside chair)?: A Little Help needed to walk in hospital room?: A Little Help needed climbing 3-5 steps with a railing? : A Lot 6 Click Score: 18    End of Session   Activity Tolerance: Patient limited by pain Patient left: in bed;with call bell/phone within reach;with bed alarm set;Other (comment) (Engineer, structural outside door) Nurse Communication: Mobility status PT Visit Diagnosis: Difficulty in walking, not elsewhere classified (R26.2);Pain Pain - Right/Left: Right Pain - part of body:  (chest wall)     Time: 8850-2774 PT Time Calculation (min) (ACUTE ONLY): 12 min  Charges:  $Gait Training: 8-22 mins                     Windell Norfolk, DPT, PN1   Supplemental Physical Therapist [place]    Pager [phone] Acute Rehab Office [phone]

## 2020-12-01 NOTE — Progress Notes (Signed)
PROGRESS NOTE    [name]  OVF:[id number] DOB: [birth year] DOA: 10/28/2020 PCP: Patient, No Pcp Per (Inactive)   Chief Complaint  Patient presents with  . Shortness of Breath    States that it hurts to breathe et that he has Valecia Beske collapsed upper right lung   Brief Narrative:  The patient is Ronald Butler 45 year old Caucasian male with Kamera Dubas past medical history significant for but not limited to recurrent spontaneous pneumothorax previously refused VATS, polysubstance abuse, seizure disorder as well as other comorbidities who was apprehended by law enforcement and incarcerated and subsequently developed chest pain and shortness of breath and was found to have Maleta Pacha right sided pneumothorax.  Nela Bascom chest tube was placed and he was transferred to [name] for cardiothoracic surgery evaluation from [facility].  He subsequently underwent VATS with wedge resection on 10/30/2020. He was also found to be positive for COVID-19 incidentally.  On 11/02/2020 his chest tube was clamped and pneumothorax reoccurred with lung collapse.  Continues to have his chest tube to waterseal and an air leak so cardiothoracic surgery recommending continuing chest tube.  Repeat CXR on 11/16/20 shows "Right chest tube in stable position. Persistent right base pneumothorax." Patient underwent Kairyn Olmeda CT Chest to check for any further bullous disease and it showed "Small residual right basilar pneumothorax also with incomplete re-expansion of the superior segment of the right lower lobe despite well-positioned thoracostomy tube. Evidence of prior surgical resection of pulmonary blebs at the right lung apex and superior segment of the right lower lobe. Three small blebs present along the anterior aspect of the right lung apex. Mild centrilobular pulmonary emphysema."\  Clamping trial attempted 11/17/2020 but resulted in an expanding basilar pneumothorax.    He's now s/p intrabronchial valve placement on 5/31 with chest tube now  removed.  Assessment & Plan:   Active Problems:   Pneumothorax  Right Sided Pneumothorax s/p multiple Wedge bleb Resections on 10/30/20 Persistent air leak Shortness of breath -- s/p robatic assisted right video thoracoscopy, extensive lysis of adhesions, wedge resection of the right upper and lower lobes, apical pleurectomy, mechanical pleurodesis and intercostal nerve block on 5/2 --persistent right basilar pneumothorax.  Patienthasfailed clamping chest tubetwice(increased pneumothorax) -- s/p bronchoscopy with endobronchial valve placement and betadine pleurodesis on 5/31 -- CXR with unchanged small R apical pneumothorax today - per CT surgery, stable for discharge - has follow up appointment -- CT surgery c/s, appreciate assistance -- s/p chest tube removal 6/1 -- c/o continued shortness of breath - have discussed with CT surgery, they note with endobronchial valves on R, essentially using L lung only so some shortness of breath is expected.    Chest tube pain  Chronic Opiate use --was receiving IV dilaudid q4h for 25 days since presentation, stopped on 5/25 and transitioned to PO dilaudid --weaning dilaudid --will continue to wean opiates over next couple of days (he's been on high dose opiates over past several weeks and won't get opiates in the prison, high risk of withdrawal), if does well, hopefully can wean down quickly and avoid withdrawal after discharge - continue weaning today  Anxiety Patient has reasonable anxiety regarding his shortness of breath and being discharged to jail.  Tried to provide reassurance, that at this time, he's medically appropriate and surgery notes he's appropriate for discharge.  I requested surgery to discuss post op expectations 6/2, which they've done.  Reassurance provided.   Nausea and Vomiting, resolved --Currently tolerating regular diet. --No need for MIVF --zofran PRN  COVID-19 -Incidental finding and Unclear how long he has had  this -Observing for now -Off of Isolation -Tested Positive 10/28/20  Seizure disorder --on Depakote, Vimpat and zonisamide --cont current regimen  Current smoker --encouraged cessation on 5/26, pt agreeable  Normocytic Anemia  Folate def -Stable.   -anemia labs c/w folate deficiency --cont folic acid supplement (new)  On eliquis for DVT ppx, was refusiing lovenox  DVT prophylaxis: SCD, eliquis  Code Status: full  Family Communication: none at bedside Disposition:   Status is: Inpatient  Remains inpatient appropriate because:Inpatient level of care appropriate due to severity of illness   Dispo: The patient is from: Home              Anticipated d/c is to: Home              Patient currently is not medically stable to d/c.   Difficult to place patient No       Consultants:   CT surgery  Procedures:  5/31 Bronchoscopy, endobronchial valve placement, betadine pleurodesis  5/2 Robotic assisted right video thoracoscopy, extensive lysis of adhesion, whed resection of right upper and lower lobes, apical pleurectomy, mechanical pleurodesis, intercostal nerve block  4/30 Chest tube insertion in ED  Antimicrobials: Anti-infectives (From admission, onward)   Start     Dose/Rate Route Frequency Ordered Stop   10/30/20 2130  ceFAZolin (ANCEF) IVPB 2g/100 mL premix        2 g 200 mL/hr over 30 Minutes Intravenous Every 8 hours 10/30/20 1641 10/31/20 0719         Subjective: frustrated  Objective: Vitals:   11/30/20 1318 11/30/20 1958 12/01/20 0428 12/01/20 1246  BP: (!) 88/58 97/64 97/65  106/72  Pulse: 87 87 70 80  Resp: 18 18 18 18   Temp: 98 F (36.7 C) 98.5 F (36.9 C) 98.7 F (37.1 C) 98.2 F (36.8 C)  TempSrc: Oral Oral Oral Oral  SpO2: 96% 96% 98% 100%  Weight:        Intake/Output Summary (Last 24 hours) at 12/01/2020 1615 Last data filed at 12/01/2020 1251 Gross per 24 hour  Intake 240 ml  Output 1600 ml  Net -1360 ml   Filed Weights    10/28/20 0511 11/06/20 0404 11/13/20 0329  Weight: 67.4 kg 67 kg 67.4 kg    Examination:  General: No acute distress. Cardiovascular: Heart sounds show Chiquetta Langner regular rate, and rhythm Lungs: decreased breath sounds on R Abdomen: Soft, nontender, nondistended  Neurological: Alert and oriented 3. Moves all extremities 4. Cranial nerves II through XII grossly intact. Skin: Warm and dry. No rashes or lesions. Extremities: No clubbing or cyanosis. No edema.   Data Reviewed: I have personally reviewed following labs and imaging studies  CBC: Recent Labs  Lab 11/25/20 0030 11/29/20 0142 11/30/20 0043  WBC 5.6 5.3 7.3  HGB 11.1* 11.4* 11.5*  HCT 33.0* 33.9* 35.3*  MCV 91.9 91.1 93.1  PLT 226 239 214    Basic Metabolic Panel: Recent Labs  Lab 11/25/20 0030 11/29/20 0142 11/30/20 0606  NA 139 137 138  K 3.9 4.4 5.3*  CL 108 104 108  CO2 26 23 24   GLUCOSE 120* 115* 89  BUN 12 30* 26*  CREATININE 0.92 1.18 1.00  CALCIUM 8.9 9.3 8.8*  MG 2.0 2.1 1.8    GFR: Estimated Creatinine Clearance: 90.8 mL/min (by C-G formula based on SCr of 1 mg/dL).  Liver Function Tests: No results for input(s): AST, ALT, ALKPHOS, BILITOT, PROT, ALBUMIN in the  last 168 hours.  CBG: No results for input(s): GLUCAP in the last 168 hours.   No results found for this or any previous visit (from the past 240 hour(s)).       Radiology Studies: DG CHEST PORT 1 VIEW  Result Date: 11/30/2020 CLINICAL DATA:  Chest tube removal EXAM: PORTABLE CHEST 1 VIEW COMPARISON:  11/29/2020 FINDINGS: Right upper lobectomy. Multiple endobronchial valves on the right. Small right apical pneumothorax approximately 10 mm in thickness. No significant interval change. No pleural effusion. Lungs are well aerated and clear. IMPRESSION: Small right apical pneumothorax unchanged. Electronically Signed   By: Marlan Palau M.D.   On: 11/30/2020 10:16        Scheduled Meds: . acetaminophen  1,000 mg Oral Q8H  .  apixaban  2.5 mg Oral BID  . bisacodyl  10 mg Oral Daily  . divalproex  500 mg Oral Daily  . divalproex  750 mg Oral QHS  . feeding supplement  237 mL Oral BID BM  . ibuprofen  400 mg Oral QID  . lacosamide  100 mg Oral BID  . lidocaine  1 patch Transdermal Q24H  . pantoprazole  40 mg Oral Daily  . polyethylene glycol  17 g Oral Daily  . senna-docusate  1 tablet Oral QHS  . zonisamide  300 mg Oral QHS   Continuous Infusions:   LOS: 34 days    Time spent: over 30 min    Lacretia Nicks, MD [facility]   To contact the attending provider between 7A-7P or the covering provider during after hours 7P-7A, please log into the web site [email or web address] and access using universal [place] password for that web site. If you do not have the password, please call the hospital operator.  12/01/2020, 4:15 PM

## 2020-12-01 NOTE — Progress Notes (Signed)
CSW received request to speak with patient. Patient reported his medical journey here at the hospital and expressed frustration at not feeling better even though he has a huge hospital bill. He also stated he felt he was being rushed out of the hospital. CSW provided supportive listening. He requested advice on what he should do with his unanswered medical questions. CSW advised him to formulate his question succinctly and ask the physicians. His question appears to be based around why he cannot breathe out of one of his lungs (he stated he is not allowed to use it). CSW encouraged him to seek clarification with the medical team since they likely conducted the surgery for a reason, I.e., to improve his health in some way. He expressed appreciation for CSW listening.   Joaquin Courts, MSW, [facility]

## 2020-12-01 NOTE — Progress Notes (Signed)
Patient with c/o chest tightness and being short of breath. O2 sats 98% on room air with clear lung sounds noted bilaterally. BP 100/62, pulse 72, Resp 18. Patient appears calm with no outward s/s of distress. O2 placed on patient via [place] @ 2lpm for comfort. EKG obtained, showing NSR. MD paged.

## 2020-12-01 NOTE — Progress Notes (Signed)
Spoke with Elsie Lincoln MD. Will continue to monitor patient.

## 2020-12-02 LAB — COMPREHENSIVE METABOLIC PANEL
ALT: 12 U/L (ref 0–44)
AST: 13 U/L — ABNORMAL LOW (ref 15–41)
Albumin: 3.6 g/dL (ref 3.5–5.0)
Alkaline Phosphatase: 62 U/L (ref 38–126)
Anion gap: 8 (ref 5–15)
BUN: 16 mg/dL (ref 6–20)
CO2: 22 mmol/L (ref 22–32)
Calcium: 9.2 mg/dL (ref 8.9–10.3)
Chloride: 111 mmol/L (ref 98–111)
Creatinine, Ser: 0.83 mg/dL (ref 0.61–1.24)
GFR, Estimated: >60 mL/min (ref >60)
Glucose, Bld: 98 mg/dL (ref 70–99)
Potassium: 3.8 mmol/L (ref 3.5–5.1)
Sodium: 141 mmol/L (ref 135–145)
Total Bilirubin: 0.3 mg/dL (ref 0.3–1.2)
Total Protein: 6.6 g/dL (ref 6.5–8.1)

## 2020-12-02 LAB — CBC WITH DIFFERENTIAL/PLATELET
Abs Immature Granulocytes: 0.02 10*3/uL (ref 0.00–0.07)
Basophils Absolute: 0 10*3/uL (ref 0.0–0.1)
Basophils Relative: 1 %
Eosinophils Absolute: 0.1 10*3/uL (ref 0.0–0.5)
Eosinophils Relative: 2 %
HCT: 36.1 % — ABNORMAL LOW (ref 39.0–52.0)
Hemoglobin: 12.3 g/dL — ABNORMAL LOW (ref 13.0–17.0)
Immature Granulocytes: 0 %
Lymphocytes Relative: 25 %
Lymphs Abs: 1.4 10*3/uL (ref 0.7–4.0)
MCH: 30.9 pg (ref 26.0–34.0)
MCHC: 34.1 g/dL (ref 30.0–36.0)
MCV: 90.7 fL (ref 80.0–100.0)
Monocytes Absolute: 0.8 10*3/uL (ref 0.1–1.0)
Monocytes Relative: 14 %
Neutro Abs: 3.1 10*3/uL (ref 1.7–7.7)
Neutrophils Relative %: 58 %
Platelets: 200 10*3/uL (ref 150–400)
RBC: 3.98 MIL/uL — ABNORMAL LOW (ref 4.22–5.81)
RDW: 13.8 % (ref 11.5–15.5)
WBC: 5.4 10*3/uL (ref 4.0–10.5)
nRBC: 0 % (ref 0.0–0.2)

## 2020-12-02 LAB — MAGNESIUM: Magnesium: 1.9 mg/dL (ref 1.7–2.4)

## 2020-12-02 LAB — PHOSPHORUS: Phosphorus: 3.2 mg/dL (ref 2.5–4.6)

## 2020-12-02 MED ORDER — HYDROMORPHONE HCL 2 MG PO TABS
2.0000 mg | ORAL_TABLET | Freq: Two times a day (BID) | ORAL | Status: DC | PRN
  Administered 2020-12-02 – 2020-12-03 (×3): 2 mg via ORAL
  Filled 2020-12-02 (×3): qty 1

## 2020-12-02 NOTE — Progress Notes (Signed)
Physical Therapy Treatment Patient Details Name: Ronald Butler MRN: [id number] DOB: 03-Apr-1976 Today's Date: 12/02/2020    History of Present Illness 45 yo male adm 4/30 from police custody with SOB and chest pain. 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  Found incidental Covid. On 5/2 pt had wedge resection of rt upper and lower lobes, apical pleurectomy, and mechanical pleurodesis. Chest tube in place. Received video bronchoscopy with IBV placement on 11/28/20, then chest tube was removed on 11/29/20.   PMHx: pancreatitis, recurrent pneumothorax, seizures, cholecystectomy, smoker, THC    PT Comments    Pt continues to demonstrate reduced gait speed and fatigue during activity. Pt appears anxious and PT provides education on physiology of oxygen saturation with collapsed lung. Pt with one posterior LOB this session, many inconsistencies observed with gait performance including R foot drag when utilizing RW but good R foot clearance with minimal UE support of hand hold. Pt will continue to benefit from acute PT POC to improve activity tolerance and independence with mobility. PT continues to recommend a RW at this time due to loss of balance without use of walker.   Follow Up Recommendations  Supervision - Intermittent     Equipment Recommendations  Rolling walker with 5" wheels    Recommendations for Other Services       Precautions / Restrictions Precautions Precautions: Fall Restrictions Weight Bearing Restrictions: No    Mobility  Bed Mobility Overal bed mobility: Modified Independent Bed Mobility: Supine to Sit     Supine to sit: Modified independent (Device/Increase time)     General bed mobility comments: increased time    Transfers Overall transfer level: Needs assistance Equipment used: Rolling walker (2 wheeled);None Transfers: Sit to/from Stand Sit to Stand: Supervision;Min guard         General transfer comment: supervision with RW, minG  without device  Ambulation/Gait Ambulation/Gait assistance: Min guard;Mod assist Gait Distance (Feet): 15 Feet (15' with RW x2, 6' with hand hold modA due to one posterior LOB) Assistive device: Rolling walker (2 wheeled);1 person hand held assist Gait Pattern/deviations: Step-to pattern Gait velocity: reduced Gait velocity interpretation: <1.31 ft/sec, indicative of household ambulator General Gait Details: pt with slow step-to gait, intermittent periods of R foot drag with use of RW, no LOB with use of RW. Ambulating with PT hand hold p demonstrates short step-to gait, intermittent periods of no force applied through PT hand hold yet one instance of slow posterior loss of balance needing physical assistance to recover. PT defers further gait without device due to safety concerns.   Stairs             Wheelchair Mobility    Modified Rankin (Stroke Patients Only)       Balance Overall balance assessment: Needs assistance Sitting-balance support: No upper extremity supported;Feet supported Sitting balance-Leahy Scale: Good     Standing balance support: Single extremity supported Standing balance-Leahy Scale: Poor Standing balance comment: reliant on hand hold or UE support of RW                            Cognition Arousal/Alertness: Awake/alert Behavior During Therapy: Anxious Overall Cognitive Status: Within Functional Limits for tasks assessed                                        Exercises  General Comments General comments (skin integrity, edema, etc.): pt reports SOB, demonstrates increased work of breathing, however SpO2 99-100% with activity. HR stable in 90s. Pt also reports nausea and dizziness however BP stable 112/78.      Pertinent Vitals/Pain Pain Assessment: 0-10 Pain Score: 7  Pain Location: chest wall Pain Descriptors / Indicators: Aching Pain Intervention(s): Monitored during session    Home Living                       Prior Function            PT Goals (current goals can now be found in the care plan section) Acute Rehab PT Goals Patient Stated Goal: to go home Progress towards PT goals: Progressing toward goals (slowly)    Frequency    Min 2X/week      PT Plan Current plan remains appropriate    Co-evaluation              AM-PAC PT "6 Clicks" Mobility   Outcome Measure  Help needed turning from your back to your side while in a flat bed without using bedrails?: None Help needed moving from lying on your back to sitting on the side of a flat bed without using bedrails?: None Help needed moving to and from a bed to a chair (including a wheelchair)?: A Little Help needed standing up from a chair using your arms (e.g., wheelchair or bedside chair)?: A Little Help needed to walk in hospital room?: A Little Help needed climbing 3-5 steps with a railing? : A Lot 6 Click Score: 19    End of Session   Activity Tolerance: Other (comment) (limited by reports of fatigue, pt seems limited by anxiety as well) Patient left: in bed;with call bell/phone within reach;Other (comment) Teacher, early years/pre present) Nurse Communication: Mobility status PT Visit Diagnosis: Difficulty in walking, not elsewhere classified (R26.2);Pain Pain - Right/Left: Right Pain - part of body:  (chest/flank)     Time: 5364-6803 PT Time Calculation (min) (ACUTE ONLY): 32 min  Charges:  $Gait Training: 8-22 mins $Therapeutic Activity: 8-22 mins                     Arlyss Gandy, PT, DPT Acute Rehabilitation Pager: [phone]    Arlyss Gandy 12/02/2020, 6:02 PM

## 2020-12-02 NOTE — Progress Notes (Signed)
PROGRESS NOTE    Ronald Butler  ZOX:[id number] DOB: 1975/12/08 DOA: 10/28/2020 PCP: Patient, No Pcp Per (Inactive)   Chief Complaint  Patient presents with  . Shortness of Breath    States that it hurts to breathe et that he has Ronald Butler collapsed upper right lung   Brief Narrative:  The patient is Ronald Butler 45 year old Caucasian male with Ronald Butler past medical history significant for but not limited to recurrent spontaneous pneumothorax previously refused VATS, polysubstance abuse, seizure disorder as well as other comorbidities who was apprehended by law enforcement and incarcerated and subsequently developed chest pain and shortness of breath and was found to have Ronald Butler right sided pneumothorax.  Ronald Butler chest tube was placed and he was transferred to [name] for cardiothoracic surgery evaluation from [facility].  He subsequently underwent VATS with wedge resection on 10/30/2020. He was also found to be positive for COVID-19 incidentally.  On 11/02/2020 his chest tube was clamped and pneumothorax reoccurred with lung collapse.  Continues to have his chest tube to waterseal and an air leak so cardiothoracic surgery recommending continuing chest tube.  Repeat CXR on 11/16/20 shows "Right chest tube in stable position. Persistent right base pneumothorax." Patient underwent Ronald Butler CT Chest to check for any further bullous disease and it showed "Small residual right basilar pneumothorax also with incomplete re-expansion of the superior segment of the right lower lobe despite well-positioned thoracostomy tube. Evidence of prior surgical resection of pulmonary blebs at the right lung apex and superior segment of the right lower lobe. Three small blebs present along the anterior aspect of the right lung apex. Mild centrilobular pulmonary emphysema."\  Clamping trial attempted 11/17/2020 but resulted in an expanding basilar pneumothorax.    He's now s/p intrabronchial valve placement on 5/31 with chest tube now  removed.  Assessment & Plan:   Active Problems:   Pneumothorax  Right Sided Pneumothorax s/p multiple Wedge bleb Resections on 10/30/20 Persistent air leak Shortness of breath -- s/p robatic assisted right video thoracoscopy, extensive lysis of adhesions, wedge resection of the right upper and lower lobes, apical pleurectomy, mechanical pleurodesis and intercostal nerve block on 5/2 --persistent right basilar pneumothorax.  Patienthasfailed clamping chest tubetwice(increased pneumothorax) -- s/p bronchoscopy with endobronchial valve placement and betadine pleurodesis on 5/31 -- CXR with unchanged small R apical pneumothorax today - per CT surgery, stable for discharge - has follow up appointment -- CT surgery c/s, appreciate assistance -- s/p chest tube removal 6/1 -- c/o continued shortness of breath - have discussed with CT surgery, they note with endobronchial valves on R, essentially using L lung only so some shortness of breath is expected.  Have discussed this with patient.  He continues to express frustration, discussed with Dr. Cliffton Butler who will see him.    Chest tube pain  Chronic Opiate use --was receiving IV dilaudid q4h for 25 days since presentation, stopped on 5/25 and transitioned to PO dilaudid --weaning dilaudid --will continue to wean opiates over next couple of days (he's been on high dose opiates over past several weeks and won't get opiates in the prison, high risk of withdrawal), if does well, hopefully can wean down quickly and avoid withdrawal after discharge - continue weaning today -> maybe ready to d/c tomorrow if doing well  Anxiety Patient has reasonable anxiety regarding his shortness of breath and being discharged to jail.  Tried to provide reassurance, that at this time, he's medically appropriate and surgery notes he's appropriate for discharge.  I requested surgery  to discuss post op expectations 6/2, which they've done.  Reassurance provided.  Surgery  going to talk to him today.  Nausea and Vomiting, resolved --Currently tolerating regular diet. --No need for MIVF --zofran PRN  COVID-19 -Incidental finding and Unclear how long he has had this -Observing for now -Off of Isolation -Tested Positive 10/28/20  Seizure disorder --on Depakote, Vimpat and zonisamide --cont current regimen  Current smoker --encouraged cessation on 5/26, pt agreeable  Normocytic Anemia  Folate def -Stable.   -anemia labs c/w folate deficiency --cont folic acid supplement (new)  On eliquis for DVT ppx, was refusiing lovenox  DVT prophylaxis: SCD, eliquis  Code Status: full  Family Communication: none at bedside Disposition:   Status is: Inpatient  Remains inpatient appropriate because:Inpatient level of care appropriate due to severity of illness   Dispo: The patient is from: Home              Anticipated d/c is to: Home              Patient currently is not medically stable to d/c.   Difficult to place patient No       Consultants:   CT surgery  Procedures:  5/31 Bronchoscopy, endobronchial valve placement, betadine pleurodesis  5/2 Robotic assisted right video thoracoscopy, extensive lysis of adhesion, whed resection of right upper and lower lobes, apical pleurectomy, mechanical pleurodesis, intercostal nerve block  4/30 Chest tube insertion in ED  Antimicrobials: Anti-infectives (From admission, onward)   Start     Dose/Rate Route Frequency Ordered Stop   10/30/20 2130  ceFAZolin (ANCEF) IVPB 2g/100 mL premix        2 g 200 mL/hr over 30 Minutes Intravenous Every 8 hours 10/30/20 1641 10/31/20 0719         Subjective: Continues to be frustrated with continued SOB  Objective: Vitals:   12/01/20 0428 12/01/20 1246 12/01/20 2028 12/02/20 0426  BP: 97/65 106/72 107/71 116/77  Pulse: 70 80 93 68  Resp: 18 18 18 18   Temp: 98.7 F (37.1 C) 98.2 F (36.8 C) 98.7 F (37.1 C) 98.3 F (36.8 C)  TempSrc: Oral Oral  Oral Oral  SpO2: 98% 100% 98% 100%  Weight:        Intake/Output Summary (Last 24 hours) at 12/02/2020 1002 Last data filed at 12/01/2020 2029 Gross per 24 hour  Intake 240 ml  Output 1200 ml  Net -960 ml   Filed Weights   10/28/20 0511 11/06/20 0404 11/13/20 0329  Weight: 67.4 kg 67 kg 67.4 kg    Examination:  General: No acute distress. Cardiovascular: Heart sounds show Marge Vandermeulen regular rate, and rhythm.  Lungs: diminished on R, R sided dressing intact Abdomen: Soft, nontender, nondistended Neurological: Alert and oriented 3. Moves all extremities 4. Cranial nerves II through XII grossly intact. Skin: Warm and dry. No rashes or lesions. Extremities: No clubbing or cyanosis. No edema.    Data Reviewed: I have personally reviewed following labs and imaging studies  CBC: Recent Labs  Lab 11/29/20 0142 11/30/20 0043  WBC 5.3 7.3  HGB 11.4* 11.5*  HCT 33.9* 35.3*  MCV 91.1 93.1  PLT 239 214    Basic Metabolic Panel: Recent Labs  Lab 11/29/20 0142 11/30/20 0606  NA 137 138  K 4.4 5.3*  CL 104 108  CO2 23 24  GLUCOSE 115* 89  BUN 30* 26*  CREATININE 1.18 1.00  CALCIUM 9.3 8.8*  MG 2.1 1.8    GFR: Estimated Creatinine Clearance: 90.8  mL/min (by C-G formula based on SCr of 1 mg/dL).  Liver Function Tests: No results for input(s): AST, ALT, ALKPHOS, BILITOT, PROT, ALBUMIN in the last 168 hours.  CBG: No results for input(s): GLUCAP in the last 168 hours.   No results found for this or any previous visit (from the past 240 hour(s)).       Radiology Studies: No results found.      Scheduled Meds: . acetaminophen  1,000 mg Oral Q8H  . apixaban  2.5 mg Oral BID  . bisacodyl  10 mg Oral Daily  . divalproex  500 mg Oral Daily  . divalproex  750 mg Oral QHS  . feeding supplement  237 mL Oral BID BM  . ibuprofen  400 mg Oral QID  . lacosamide  100 mg Oral BID  . lidocaine  1 patch Transdermal Q24H  . pantoprazole  40 mg Oral Daily  . polyethylene  glycol  17 g Oral Daily  . senna-docusate  1 tablet Oral QHS  . zonisamide  300 mg Oral QHS   Continuous Infusions:   LOS: 35 days    Time spent: over 30 min    Lacretia Nicks, MD [facility]   To contact the attending provider between 7A-7P or the covering provider during after hours 7P-7A, please log into the web site [email or web address] and access using universal [place] password for that web site. If you do not have the password, please call the hospital operator.  12/02/2020, 10:02 AM

## 2020-12-03 ENCOUNTER — Inpatient Hospital Stay (HOSPITAL_COMMUNITY): Payer: Self-pay

## 2020-12-03 LAB — CBC WITH DIFFERENTIAL/PLATELET
Abs Immature Granulocytes: 0.01 10*3/uL (ref 0.00–0.07)
Basophils Absolute: 0.1 10*3/uL (ref 0.0–0.1)
Basophils Relative: 1 %
Eosinophils Absolute: 0.1 10*3/uL (ref 0.0–0.5)
Eosinophils Relative: 1 %
HCT: 35.1 % — ABNORMAL LOW (ref 39.0–52.0)
Hemoglobin: 11.9 g/dL — ABNORMAL LOW (ref 13.0–17.0)
Immature Granulocytes: 0 %
Lymphocytes Relative: 27 %
Lymphs Abs: 1.3 10*3/uL (ref 0.7–4.0)
MCH: 30.9 pg (ref 26.0–34.0)
MCHC: 33.9 g/dL (ref 30.0–36.0)
MCV: 91.2 fL (ref 80.0–100.0)
Monocytes Absolute: 0.8 10*3/uL (ref 0.1–1.0)
Monocytes Relative: 17 %
Neutro Abs: 2.6 10*3/uL (ref 1.7–7.7)
Neutrophils Relative %: 54 %
Platelets: 207 10*3/uL (ref 150–400)
RBC: 3.85 MIL/uL — ABNORMAL LOW (ref 4.22–5.81)
RDW: 14 % (ref 11.5–15.5)
WBC: 4.7 10*3/uL (ref 4.0–10.5)
nRBC: 0 % (ref 0.0–0.2)

## 2020-12-03 LAB — MAGNESIUM: Magnesium: 2 mg/dL (ref 1.7–2.4)

## 2020-12-03 LAB — PHOSPHORUS: Phosphorus: 3.7 mg/dL (ref 2.5–4.6)

## 2020-12-03 LAB — COMPREHENSIVE METABOLIC PANEL
ALT: 15 U/L (ref 0–44)
AST: 10 U/L — ABNORMAL LOW (ref 15–41)
Albumin: 3.4 g/dL — ABNORMAL LOW (ref 3.5–5.0)
Alkaline Phosphatase: 67 U/L (ref 38–126)
Anion gap: 5 (ref 5–15)
BUN: 19 mg/dL (ref 6–20)
CO2: 24 mmol/L (ref 22–32)
Calcium: 9 mg/dL (ref 8.9–10.3)
Chloride: 112 mmol/L — ABNORMAL HIGH (ref 98–111)
Creatinine, Ser: 1.07 mg/dL (ref 0.61–1.24)
GFR, Estimated: >60 mL/min (ref >60)
Glucose, Bld: 112 mg/dL — ABNORMAL HIGH (ref 70–99)
Potassium: 3.8 mmol/L (ref 3.5–5.1)
Sodium: 141 mmol/L (ref 135–145)
Total Bilirubin: 0.3 mg/dL (ref 0.3–1.2)
Total Protein: 6.3 g/dL — ABNORMAL LOW (ref 6.5–8.1)

## 2020-12-03 MED ORDER — ONDANSETRON 4 MG PO TBDP
4.0000 mg | ORAL_TABLET | Freq: Four times a day (QID) | ORAL | Status: DC | PRN
  Administered 2020-12-03: 4 mg via ORAL
  Filled 2020-12-03: qty 1

## 2020-12-03 MED ORDER — PANTOPRAZOLE SODIUM 40 MG PO TBEC: 40.0000 mg | DELAYED_RELEASE_TABLET | Freq: Every day | ORAL | 0 refills | Status: DC

## 2020-12-03 MED ORDER — PROCHLORPERAZINE EDISYLATE 10 MG/2ML IJ SOLN
10.0000 mg | Freq: Four times a day (QID) | INTRAMUSCULAR | Status: DC | PRN
  Administered 2020-12-03: 10 mg via INTRAVENOUS
  Filled 2020-12-03: qty 2

## 2020-12-03 MED ORDER — FOLIC ACID 1 MG PO TABS: 1.0000 mg | ORAL_TABLET | Freq: Every day | ORAL | 1 refills | Status: DC

## 2020-12-03 MED ORDER — IBUPROFEN 400 MG PO TABS: 400.0000 mg | ORAL_TABLET | Freq: Four times a day (QID) | ORAL | 0 refills | Status: DC | PRN

## 2020-12-03 MED ORDER — HYDROMORPHONE HCL 2 MG PO TABS
2.0000 mg | ORAL_TABLET | Freq: Once | ORAL | Status: AC | PRN
  Administered 2020-12-03: 2 mg via ORAL
  Filled 2020-12-03: qty 1

## 2020-12-03 MED ORDER — ALBUTEROL SULFATE HFA 108 (90 BASE) MCG/ACT IN AERS: 2.0000 | INHALATION_SPRAY | Freq: Four times a day (QID) | RESPIRATORY_TRACT | 2 refills | Status: DC | PRN

## 2020-12-03 NOTE — Progress Notes (Signed)
[facility]C'd [facility] per MD order.  Discussed with the patient and all questions fully answered.  VSS, Skin clean, dry and intact without evidence of skin break down, no evidence of skin tears noted. IV catheter discontinued intact. Site without signs and symptoms of complications. Dressing and pressure applied.  An After Visit Summary was printed and given to the police officer. Patient discharged with walker. Patient escorted via WC, and D/C via police.  Quincy Carnes 12/03/2020 1:47 PM

## 2020-12-03 NOTE — Progress Notes (Signed)
Entered pts room to administer morning medications and PRN pain med. Found pts bedsheet on left side of bed tied in 3 separate knots. When asked about what happened pt stated "I got bored and tied them in knots". Pt denied trying to escape or being suicidal. Officer outside of room notified and approached pt. Per officer, pt also denied any suicidal ideations. Notified charge RN Lowella Bandy and Bonnee Quin, Public relations account executive.

## 2020-12-03 NOTE — Discharge Summary (Signed)
Physician Discharge Summary  Ronald Butler ZOX:[id number] DOB: Jul 16, 1975 DOA: 10/28/2020  PCP: Patient, No Pcp Per (Inactive)  Admit date: 10/28/2020 Discharge date: 12/03/2020  Time spent: 40 minutes  Recommendations for Outpatient Follow-up:  1. Follow outpatient CBC/CMP 2. Follow CT surgery outpatient   3. Follow with pulmonology outpatient - consider w/u for COPD (a1at?) 4. Encourage smoking cessation  Discharge Diagnoses:  Active Problems:   Pneumothorax   Discharge Condition: stable  Diet recommendation: heart healthy  Filed Weights   10/28/20 0511 11/06/20 0404 11/13/20 0329  Weight: 67.4 kg 67 kg 67.4 kg    History of present illness:  The patient is Ronald Butler 45 year old Caucasian male with Ronald Butler past medical history significant for but not limited to recurrent spontaneous pneumothorax previously refused VATS, polysubstance abuse, seizure disorder as well as other comorbidities who was apprehended by law enforcement and incarcerated and subsequently developed chest pain and shortness of breath and was found to have Ronald Butler right sided pneumothorax. Ronald Butler chest tube was placed and he was transferred to [name] for cardiothoracic surgery evaluation from [facility]. He subsequently underwent VATS with wedge resection on 10/30/2020. He was also found to be positive for COVID-19 incidentally. He had recurrent pneumothorax after chest tube clamping.  He had  Ronald Butler prolonged course and due to persistent air leak, on 5/31 he had bronchoscopy with endobronchial valve placement and betadine pleurodesis.  CXR's after this were stable and CT surgery has now signed off, ok with outpatient follow up.  His opiates were tapered over the last several days.  He's currently appropriate for discharge with outpatient follow up with CT surgery.  See below for additional details  Hospital Course:  Right Sided Pneumothorax s/p multiple Wedge bleb Resections on 10/30/20 Persistent air leak Shortness of  breath -- s/p robatic assisted right video thoracoscopy, extensive lysis of adhesions, wedge resection of the right upper and lower lobes, apical pleurectomy, mechanical pleurodesis and intercostal nerve block on 5/2 -- due to persistent air leak, he had bronchoscopy with endobronchial valve placement and betadine pleurodesis on 5/31 -- chest tube was removed 6/1 -- per CT surgery, stable for discharge on 6/2 - has follow up appointment with CT surgery -- he continues to c/o continued shortness of breath - have discussed with CT surgery, they note with endobronchial valves on R, essentially using L lung only so some shortness of breath is expected.  Have discussed this with patient.  He continues to express frustration, discussed with Dr. Cliffton Asters who discussed with him as well on 6/4.  Continue to follow outpatient.  Workup additionally as needed.  CXR 6/5 with COPD changes with probable persistent R apex ptx. - would follow with pulm outpatient   Chest tube pain  Chronic Opiate use --was on high dose opiates for Ronald Butler majority of hospitalization -- has been rapidly weaned over past few days, doesn't seem to be withdrawing at this point -- d/c opiates at discharge -- chest tube has been removed  Anxiety Patient has reasonable anxiety regarding his shortness of breath and being discharged to jail.  Tried to provide reassurance, that at this time, he's medically appropriate and surgery notes he's appropriate for discharge.  Reassurance provided.  CT surgery has seen him again to discuss post op expectations as well and answer questions regarding surgery on 6/4.  Nausea and Vomiting, resolved --Currently tolerating regular diet. --No need for MIVF --zofran PRN  COVID-19 -Incidental finding and Unclear how long he has had this -Observing for  now -Off of Isolation -Tested Positive 10/28/20  Seizure disorder --on Depakote, Vimpat and zonisamide --cont current regimen  Current  smoker --encouraged cessation on 5/26, pt agreeable --continue to encourage  Normocytic Anemia  Folate def -Stable.  -anemia labs c/w folate deficiency --cont folic acid supplement (new)  Procedures: 5/31 Bronchoscopy, endobronchial valve placement, betadine pleurodesis  5/2 Robotic assisted right video thoracoscopy, extensive lysis of adhesion, whed resection of right upper and lower lobes, apical pleurectomy, mechanical pleurodesis, intercostal nerve block  4/30 Chest tube insertion in ED  Consultations:  CT surgery  Discharge Exam: Vitals:   12/02/20 2023 12/03/20 0426  BP: 120/77 109/89  Pulse: 83 79  Resp: 18 18  Temp: 98.2 F (36.8 C) 98.4 F (36.9 C)  SpO2: 96% 98%   C/o pain/discomfort  SOB is stable He notes Dr. Cliffton Asters came by yesterday and talked to him about expectations, surgery  General: No acute distress. Cardiovascular: Heart sounds show Ronald Butler regular rate, and rhythm. Lungs: diminished on R  Abdomen: Soft, nontender, nondistended Neurological: Alert and oriented 3. Moves all extremities 4 . Cranial nerves II through XII grossly intact. Skin: Warm and dry. No rashes or lesions. Extremities: No clubbing or cyanosis. No edema. Discharge Instructions   Discharge Instructions    Call MD for:  difficulty breathing, headache or visual disturbances   Complete by: As directed    Call MD for:  extreme fatigue   Complete by: As directed    Call MD for:  hives   Complete by: As directed    Call MD for:  persistant dizziness or light-headedness   Complete by: As directed    Call MD for:  persistant nausea and vomiting   Complete by: As directed    Call MD for:  redness, tenderness, or signs of infection (pain, swelling, redness, odor or green/yellow discharge around incision site)   Complete by: As directed    Call MD for:  severe uncontrolled pain   Complete by: As directed    Call MD for:  temperature >100.4   Complete by: As directed    Diet -  low sodium heart healthy   Complete by: As directed    Discharge instructions   Complete by: As directed    You were seen for Ronald Butler recurrent right sided pneumothorax.  You had Charlen Bakula complicated course with Quy Lotts persistent air leak, so you eventually had bronchial valves placed on 5/31.  On 5/2 you had Zlata Alcaide right video throacoscopy with wedge resection of the right upper and lower lobes as well as apical pleurectomy, mechanical pleurodesis, and lysis of adhesions.  You had Emilygrace Grothe bronchoscopy with endobronchial valve placement x7 on 5/31 as well as betadine pleurodesis.    You'll need to follow up with CT surgery in [place] couple weeks.  The bronchial valves typically stay in place for at least 30 days per surgery.  Your CXR today appears stable.  Return for new, recurrent, or worsening symptoms.  Please establish with Porsche Noguchi PCP.  Please ask your PCP to request records from this hospitalization so they know what was done and what the next steps will be.   Increase activity slowly   Complete by: As directed    No wound care   Complete by: As directed      Allergies as of 12/03/2020   No Known Allergies     Medication List    TAKE these medications   acetaminophen 325 MG tablet Commonly known as: TYLENOL Take 2 tablets (650  mg total) by mouth every 6 (six) hours as needed for mild pain (or Fever >/= 101).   albuterol 108 (90 Base) MCG/ACT inhaler Commonly known as: VENTOLIN HFA Inhale 2 puffs into the lungs every 6 (six) hours as needed for wheezing or shortness of breath.   divalproex 250 MG 24 hr tablet Commonly known as: Depakote ER Take 500 mg (2 tablets) in the morning, and 750 mg (3 tablets) in the evening   ibuprofen 400 MG tablet Commonly known as: ADVIL Take 1 tablet (400 mg total) by mouth every 6 (six) hours as needed for mild pain or moderate pain.   Lacosamide 100 MG Tabs Take 1 tablet (100 mg total) by mouth 2 (two) times daily.   pantoprazole 40 MG tablet Commonly known as:  PROTONIX Take 1 tablet (40 mg total) by mouth daily. Start taking on: December 04, 2020   zonisamide 100 MG capsule Commonly known as: ZONEGRAN Take 3 capsules (300 mg total) by mouth at bedtime.      No Known Allergies  Follow-up Information    [facility]. Call.   Contact information: [address] [place] [address] [phone]       Corliss Skains, MD. Go on 12/08/2020.   Specialty: Cardiothoracic Surgery Why: PA/LAT CXR to be taken (at [facility] which is in the same building as Dr. Lucilla Lame office on the ground floor) on 06/10 at 11:10 am;Appointment time is time is at 11:40 am Contact information: [address] [place] [address] [phone]                The results of significant diagnostics from this hospitalization (including imaging, microbiology, ancillary and laboratory) are listed below for reference.    Significant Diagnostic Studies: CT chest without contrast  Result Date: 11/16/2020 CLINICAL DATA:  Pneumothorax with persistent air leak. Evaluate for blebs. EXAM: CT CHEST WITHOUT CONTRAST TECHNIQUE: Multidetector CT imaging of the chest was performed following the standard protocol without IV contrast. COMPARISON:  None. FINDINGS: Cardiovascular: Limited evaluation in the absence of intravenous contrast. No evidence of aneurysm. Heart is normal in size. No pericardial effusion. Mediastinum/Nodes: Unremarkable CT appearance of the thyroid gland. No suspicious mediastinal or hilar adenopathy. No soft tissue mediastinal mass. The thoracic esophagus is unremarkable. Lungs/Pleura: Surgical changes of prior blebectomy at the right lung apex and superior segment of the right lower lobe. There are Anndee Connett few small blebs along the anterior margin of the right lung apex. Centrilobular pulmonary emphysema is also present. Small right basilar pneumothorax. Incomplete re-expansion of the  superior segment of the right lower lobe. Thoracostomy tube in place entering via the right 9-10 rib interspace. Upper Abdomen: No acute abnormality. Incompletely imaged pneumobilia presumably related to prior sphincterotomy. Musculoskeletal: No acute fracture or aggressive appearing lytic or blastic osseous lesion. IMPRESSION: 1. Small residual right basilar pneumothorax also with incomplete re-expansion of the superior segment of the right lower lobe despite well-positioned thoracostomy tube. 2. Evidence of prior surgical resection of pulmonary blebs at the right lung apex and superior segment of the right lower lobe. 3. Three small blebs present along the anterior aspect of the right lung apex. 4. Mild centrilobular pulmonary emphysema. Emphysema (ICD10-J43.9). Electronically Signed   By: Malachy Moan M.D.   On: 11/16/2020 08:13   DG CHEST PORT 1 VIEW  Result Date: 12/03/2020 CLINICAL DATA:  Shortness of breath RIGHT apical pneumothorax, follow-up EXAM: PORTABLE CHEST 1 VIEW COMPARISON:  Portable exam 1110 hours compared to 11/30/2020 FINDINGS: Endobronchial valves project over RIGHT perihilar region. Normal heart size, mediastinal contours, and pulmonary vascularity. Lungs appear emphysematous but clear. No pulmonary infiltrate or pleural effusion identified. Persistent small RIGHT apex pneumothorax is likely present, suboptimally seen due to superimposed osseous structures, with no pulmonary markings seen cranial to the RIGHT apex staple line. Bones unremarkable. 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 Electronically Signed   By: Ulyses SouthwardMark  Boles M.D.   On: 12/03/2020 10:49   DG CHEST PORT 1 VIEW  Result Date: 11/30/2020 CLINICAL DATA:  Chest tube removal EXAM: PORTABLE CHEST 1 VIEW COMPARISON:  11/29/2020 FINDINGS: Right upper lobectomy. Multiple endobronchial valves on the right. Small right apical pneumothorax approximately 10 mm in thickness. No significant interval  change. No pleural effusion. Lungs are well aerated and clear. IMPRESSION: Small right apical pneumothorax unchanged. Electronically Signed   By: Marlan Palauharles  Clark M.D.   On: 11/30/2020 10:16   DG CHEST PORT 1 VIEW  Result Date: 11/29/2020 CLINICAL DATA:  Shortness of breath.  Right chest tube removal. EXAM: PORTABLE CHEST 1 VIEW COMPARISON:  Earlier same day FINDINGS: Right chest tube is been removed. Pleural chest tube track remains visible as is often seen. Tiny amount of pleural air at the right apex is not increased. The right lung remains well aerated. Multiple airway valves redemonstrated. Left chest remains clear. IMPRESSION: No change other than removal of the right chest tube. Small amount of pleural air persists at the right apex. Electronically Signed   By: Paulina FusiMark  Shogry M.D.   On: 11/29/2020 11:06   DG Chest Port 1 View  Result Date: 11/29/2020 CLINICAL DATA:  Chest tube.  Bronchial valve placement. EXAM: PORTABLE CHEST 1 VIEW COMPARISON:  11/27/2020. FINDINGS: Right chest tube in stable position. Stable small right apical pneumothorax again noted. Bronchial valves noted on the right. No focal infiltrate. Mild bibasilar subsegmental atelectasis. Heart size normal. Prior cervical spine fusion. Surgical clips right upper quadrant. IMPRESSION: 1. Right chest tube in stable position. Small right apical pneumothorax again noted. 2. Bronchial valves noted the right. Mild bibasilar subsegmental atelectasis. Electronically Signed   By: Maisie Fushomas  Register   On: 11/29/2020 07:05   DG Chest Port 1 View  Result Date: 11/28/2020 CLINICAL DATA:  Right pneumothorax.  Chest tube. EXAM: PORTABLE CHEST 1 VIEW COMPARISON:  11/27/2020. FINDINGS: Right chest tube in stable position. Right base pneumothorax has improved with minimal residual. Stable tiny right apical pneumothorax. Lungs are clear. Heart size normal. No displaced rib fracture noted. IMPRESSION: Right chest tube in stable position. Right pneumothorax has  improved with minimal residual. Stable tiny right apical pneumothorax. Electronically Signed   By: Maisie Fushomas  Register   On: 11/28/2020 05:51   DG CHEST PORT 1 VIEW  Result Date: 11/27/2020 CLINICAL DATA:  45 year old male with history of pneumothorax. EXAM: PORTABLE CHEST 1 VIEW COMPARISON:  Chest x-[place] 11/26/2020. FINDINGS: Right-sided chest tube remains in position with tip in the apex of the right hemithorax. There continues to be Avia Merkley right sided pneumothorax which is very small in the apex of the right hemithorax, but there is Manpreet Kemmer much larger basilar component on today's examination. No left pneumothorax. Lungs are clear bilaterally. No pleural effusions. No evidence of pulmonary edema. Heart size is normal. Mediastinal contours are within normal limits. IMPRESSION: 1. Enlarging right-sided pneumothorax, predominantly in the base of the right hemithorax, as above. Right chest tube is stable in position. Electronically Signed   By: Reuel Boomaniel  Entrikin M.D.   On: 11/27/2020 08:14   DG Chest Port 1 View  Result Date: 11/26/2020 CLINICAL DATA:  Evaluate right-sided pneumothorax EXAM: PORTABLE CHEST 1 VIEW COMPARISON:  11/25/2020; 11/22/2020; 11/20/2020; 11/17/2020 FINDINGS: Grossly unchanged cardiac silhouette and mediastinal contours. Stable postoperative change of the right hilum and right lung apex. Stable positioning of right-sided chest tube without definitive evidence of pneumothorax. The left hemithorax remains well aerated. No new focal airspace opacities. No pleural effusion. No evidence of edema. No acute osseous abnormalities. IMPRESSION: 1. Stable positioning of right-sided chest tube without definitive evidence pneumothorax. 2. Stable postoperative change of the right lung apex and hilum. Electronically Signed   By: Simonne Come M.D.   On: 11/26/2020 08:16   DG CHEST PORT 1 VIEW  Result Date: 11/25/2020 CLINICAL DATA:  Pneumothorax EXAM: PORTABLE CHEST 1 VIEW COMPARISON:  11/22/2020 FINDINGS: Tiny  right apical pneumothorax, grossly unchanged, with indwelling right chest tube. Left lung is clear. No pleural effusion. The heart is normal in size. IMPRESSION: Tiny right apical pneumothorax, grossly unchanged, with indwelling right chest tube. Electronically Signed   By: Charline Bills M.D.   On: 11/25/2020 08:08   DG CHEST PORT 1 VIEW  Result Date: 11/22/2020 CLINICAL DATA:  Pneumothorax.  Chest tube. EXAM: PORTABLE CHEST 1 VIEW COMPARISON:  CT chest 11/16/2020, chest x-[place] 11/21/2020 FINDINGS: Right chest tube in similar position. The heart size and mediastinal contours are within normal limits. No focal consolidation. No pulmonary edema. No pleural effusion. Interval decrease in now trace right pneumothorax. Trace right apical pleural fluid again noted. No left pneumothorax. No acute osseous abnormality. IMPRESSION: Interval increase in now trace right pneumothorax with Wylee Ogden right chest tube in similar position. Electronically Signed   By: Tish Frederickson M.D.   On: 11/22/2020 06:46   DG CHEST PORT 1 VIEW  Result Date: 11/21/2020 CLINICAL DATA:  Pneumothorax.  Chest tube. EXAM: PORTABLE CHEST 1 VIEW COMPARISON:  11/20/2020. FINDINGS: Right chest tube in stable position. Stable tiny right-sided pneumothorax again noted. Left costophrenic angle incompletely imaged. Lungs are clear. No pleural effusion identified. Heart size normal. No acute bony abnormality. Prior cervical spine fusion. IMPRESSION: Right chest tube in stable position. Stable tiny right-sided pneumothorax again noted. Chest is unchanged from prior exam. Electronically Signed   By: Maisie Fus  Register   On: 11/21/2020 07:05   DG CHEST PORT 1 VIEW  Result Date: 11/20/2020 CLINICAL DATA:  Pneumothorax and chest tube. EXAM: PORTABLE CHEST 1 VIEW COMPARISON:  11/18/2020. FINDINGS: Stable position of the right chest tube. Again noted is Ebrima Ranta very small lateral right pneumothorax. Left lung remains clear. Negative for left pneumothorax. Heart and  mediastinum are stable. Trachea is midline. Partially visualized surgical hardware in the lower cervical spine. IMPRESSION: Stable small right pneumothorax. Stable position of the right chest tube. No focal lung disease. Electronically Signed   By: Richarda Overlie M.D.   On: 11/20/2020 08:39   DG CHEST PORT 1 VIEW  Result Date: 11/18/2020 CLINICAL DATA:  Evaluate pneumothorax EXAM: PORTABLE CHEST 1 VIEW COMPARISON:  Nov 17, 2020 FINDINGS: The basilar right pneumothorax is improved. There is now Yannet Rincon small right lateral component measuring 6 mm, not seen on the previous study. The right chest tube remains in place. No left-sided pneumothorax. The cardiomediastinal silhouette is stable. No other acute abnormalities. IMPRESSION: The right chest tube remains in good position. Overall, the right-sided pneumothorax is smaller in the interval. Only Aleaha Fickling small lateral component measuring 6 mm is identified. Electronically Signed  By: Gerome Sam III M.D   On: 11/18/2020 09:17   DG CHEST PORT 1 VIEW  Result Date: 11/17/2020 CLINICAL DATA:  Pneumothorax EXAM: PORTABLE CHEST 1 VIEW COMPARISON:  Nov 17, 2020 study obtained earlier in the day FINDINGS: Chest tube position unchanged. There has been interval enlargement of Jarryn Altland right base right base pneumothorax without tension component. There is atelectatic change in the right base. Lungs elsewhere clear. Heart size and pulmonary vascularity are normal. No adenopathy. No bone lesions. Right base pneumothorax now present without tension component. IMPRESSION: Interval enlargement of right base pneumothorax without tension component. Chest tube position unchanged. Right base atelectasis. Lungs elsewhere clear. Stable cardiac silhouette. Critical Value/emergent results were called by telephone at the time of interpretation on 11/17/2020 at 4:19 pm to provider Newton Pigg, RN, who verbally acknowledged these results. She informed me that the provider was aware of the findings on this  radiograph and had made appropriate adjustments to the chest tube. Electronically Signed   By: Bretta Bang III M.D.   On: 11/17/2020 16:19   DG CHEST PORT 1 VIEW  Result Date: 11/17/2020 CLINICAL DATA:  Chest pain.  Chest tube in place EXAM: PORTABLE CHEST 1 VIEW COMPARISON:  Chest radiograph and chest CT Nov 16, 2020 FINDINGS: Chest tube remains on the right. Right basilar pneumothorax again noted, better seen by CT. The focal area of presumed scarring in the right apex is less well seen by radiography than CT. No edema or airspace opacity. Heart size and pulmonary vascularity normal. No adenopathy. Postoperative change lower cervical spine. IMPRESSION: Chest tube unchanged in position on the right with persistent right basilar pneumothorax. No tension component. Scarring right apex better seen on CT. No edema or airspace opacity. Heart size normal Electronically Signed   By: Bretta Bang III M.D.   On: 11/17/2020 08:20   DG CHEST PORT 1 VIEW  Result Date: 11/16/2020 CLINICAL DATA:  Right-sided chest tube. EXAM: PORTABLE CHEST 1 VIEW COMPARISON:  11/15/2020. FINDINGS: Right chest tube in stable position. Persistent right base pneumothorax. Postsurgical changes right lung. No focal infiltrate. No pleural effusion. No acute bony abnormality. Prior cervical spine fusion. Surgical clips upper abdomen. IMPRESSION: Right chest tube in stable position. Persistent right base pneumothorax. Electronically Signed   By: Maisie Fus  Register   On: 11/16/2020 05:55   DG CHEST PORT 1 VIEW  Result Date: 11/15/2020 CLINICAL DATA:  Chest tube on the right EXAM: PORTABLE CHEST 1 VIEW COMPARISON:  Nov 14, 2020 FINDINGS: Chest tube position on the right stable. Minimal right apical pneumothorax. Postoperative change noted on the right. No edema or airspace opacity. Heart size and pulmonary vascularity normal. No adenopathy. No bone lesions. IMPRESSION: Chest tube on the right with trace apical pneumothorax on the  right. No edema or airspace opacity. Heart size normal. Electronically Signed   By: Bretta Bang III M.D.   On: 11/15/2020 08:28   DG CHEST PORT 1 VIEW  Result Date: 11/14/2020 CLINICAL DATA:  Shortness of breath.  Chest tube present. EXAM: PORTABLE CHEST 1 VIEW COMPARISON:  Nov 13, 2020 FINDINGS: Chest tube position unchanged on the right. No evident pneumothorax. No edema or airspace opacity. Heart size and pulmonary vascularity are normal. No adenopathy. No bone lesions. IMPRESSION: Chest tube position on the right unchanged. No pneumothorax evident. No edema or airspace opacity. Cardiac silhouette within normal limits. Electronically Signed   By: Bretta Bang III M.D.   On: 11/14/2020 07:51   DG Chest Port 1  View  Result Date: 11/13/2020 CLINICAL DATA:  Chest tube EXAM: PORTABLE CHEST 1 VIEW COMPARISON:  11/12/2020 FINDINGS: The heart size and mediastinal contours are within normal limits. Right-sided chest tube remains in position, tip about the right pulmonary apex. No appreciable residual pneumothorax. Both lungs are clear. The visualized skeletal structures are unremarkable. IMPRESSION: Right-sided chest tube remains in position. No appreciable residual pneumothorax. No acute abnormality of the lungs. Electronically Signed   By: Lauralyn Primes M.D.   On: 11/13/2020 08:37   DG Chest Port 1 View  Result Date: 11/12/2020 CLINICAL DATA:  Follow-up RIGHT pneumothorax with chest tube in place. EXAM: PORTABLE CHEST 1 VIEW COMPARISON:  11/11/2020 and earlier, including CT chest 10/28/2020. FINDINGS: RIGHT chest tube in place with no evidence of Alverto Shedd residual RIGHT pneumothorax. Bullous emphysematous changes in both lungs as noted previously. Lungs clear. Bronchovascular markings normal. No pleural effusions. Cardiomediastinal silhouette unremarkable and unchanged. IMPRESSION: 1. No evidence of residual RIGHT pneumothorax. 2. No acute cardiopulmonary disease. 3.  Emphysema. ([id number])  Electronically Signed   By: Hulan Saas M.D.   On: 11/12/2020 09:23   DG Chest Port 1 View  Result Date: 11/11/2020 CLINICAL DATA:  Right chest tube placement EXAM: PORTABLE CHEST 1 VIEW COMPARISON:  11/10/2020 FINDINGS: Stable position of right chest tube. The small right-sided pneumothorax has decreased in volume compared with the previous exam and is no longer perceptible. No pleural effusion or edema. No airspace densities. IMPRESSION: Decrease in volume of right-sided pneumothorax. The previously noted pneumothorax is no longer visualized. Electronically Signed   By: Signa Kell M.D.   On: 11/11/2020 08:51   DG Chest Port 1 View  Result Date: 11/10/2020 CLINICAL DATA:  Chest tube.  Air leak. EXAM: PORTABLE CHEST 1 VIEW COMPARISON:  Chest x-[place] dated 11/09/2020. FINDINGS: RIGHT-sided chest tube is stable in position with tip directed towards the RIGHT lung apex. Stable appearance of the RIGHT-sided pneumothorax, small to moderate in size. LEFT lung is clear. No pleural effusion is seen. Heart size and mediastinal contours are within normal limits. IMPRESSION: 1. Stable appearance of the RIGHT-sided pneumothorax, small to moderate in size. Stable position of the RIGHT-sided chest tube. 2. LEFT lung is clear. Electronically Signed   By: Bary Richard M.D.   On: 11/10/2020 08:01   DG Chest Port 1 View  Result Date: 11/09/2020 CLINICAL DATA:  Pneumothorax Chest tube EXAM: PORTABLE CHEST 1 VIEW COMPARISON:  11/08/2020 FINDINGS: Heart size within normal limits. Small right pneumothorax similar in size to prior examination. Right apical chest tube unchanged position. ACDF changes partially visualized in the lower cervical spine. IMPRESSION: Unchanged small right apical pneumothorax. Electronically Signed   By: Acquanetta Belling M.D.   On: 11/09/2020 07:47   DG CHEST PORT 1 VIEW  Result Date: 11/08/2020 CLINICAL DATA:  Pneumothorax, chest tube EXAM: PORTABLE CHEST 1 VIEW COMPARISON:  11/07/2020  FINDINGS: Right chest tube remains in place with small right pneumothorax, stable. No confluent opacities. Left lung clear. Heart is normal size. IMPRESSION: Stable small right pneumothorax and right chest tube. Electronically Signed   By: Charlett Nose M.D.   On: 11/08/2020 08:45   DG CHEST PORT 1 VIEW  Result Date: 11/07/2020 CLINICAL DATA:  Pneumothorax, chest tube, COVID EXAM: PORTABLE CHEST 1 VIEW COMPARISON:  Chest x-[place] 11/07/2018 FINDINGS: The heart size and mediastinal contours are within normal limits. No focal consolidation. No pulmonary edema. No pleural effusion. Interval increase in Lusine Corlett at least small volume right pneumothorax. Right chest tube in  stable position. No left pneumothorax. No acute osseous abnormality. IMPRESSION: Interval increase in size of an at least small volume right pneumothorax with right chest tube in stable position. Electronically Signed   By: Tish Frederickson M.D.   On: 11/07/2020 06:41   DG CHEST PORT 1 VIEW  Result Date: 11/06/2020 CLINICAL DATA:  Pneumothorax, chest tube EXAM: PORTABLE CHEST 1 VIEW COMPARISON:  Portable exam 0627 hours compared to 11/05/2020 FINDINGS: RIGHT thoracostomy tube unchanged. Normal heart size, mediastinal contours, and pulmonary vascularity. Lungs hyperinflated but clear. No pleural effusion seen. Persistent small RIGHT pneumothorax. IMPRESSION: Persistent small RIGHT pneumothorax despite thoracostomy tube. Electronically Signed   By: Ulyses Southward M.D.   On: 11/06/2020 08:15   DG CHEST PORT 1 VIEW  Result Date: 11/05/2020 CLINICAL DATA:  Chest pain. Positive COVID-19. Right-sided chest tube/pneumothorax. Follow-up exam. EXAM: PORTABLE CHEST 1 VIEW COMPARISON:  11/04/2020 and older studies. FINDINGS: Significant decrease in the size of the right pneumothorax, now with only Lovelle Deitrick minimal right lateral pneumothorax persisting. Stable right chest tube. Lungs are hyperexpanded, but clear. IMPRESSION: 1. Significant decrease in the right pneumothorax,  with Garvin Ellena minimal pneumothorax persisting. 2. Stable right chest tube.  Lungs remain clear. Electronically Signed   By: Amie Portland M.D.   On: 11/05/2020 09:11   DG CHEST PORT 1 VIEW  Result Date: 11/04/2020 CLINICAL DATA:  Right-sided pneumothorax with chest tube in place EXAM: PORTABLE CHEST 1 VIEW COMPARISON:  Prior chest x-[place]ha[place] yesterday 11/03/2020 FINDINGS: Stable and well positioned right-sided thoracostomy tube. Persistent and slightly increased right-sided pneumothorax. Associated atelectasis in the right lower lobe. The left lung remains clear. Cardiac and mediastinal contours remain clear. No acute osseous abnormality. IMPRESSION: Persistent and perhaps slightly enlarged right-sided pneumothorax with well-positioned chest tube in place. Electronically Signed   By: Malachy Moan M.D.   On: 11/04/2020 09:17   DG Chest Port 1V same Day  Result Date: 11/21/2020 CLINICAL DATA:  Right pneumothorax.  Chest tube clamped. EXAM: PORTABLE CHEST 1 VIEW COMPARISON:  Chest x-[place] 11/21/2020. FINDINGS: Right chest tube in stable position. Progressive right base pneumothorax. Postsurgical changes right lung. Atelectatic changes right lung base. No pleural effusion. Heart size normal. Thoracic spine scoliosis. Postsurgical changes cervical spine. IMPRESSION: Right chest tube in stable position. Progressive prominent right base pneumothorax. Consider unclamping right chest tube. Critical Value/emergent results were called by telephone at the time of interpretation on 11/21/2020 at 1:51 pm to nurse Lurena Joiner, who verbally acknowledged these results. Electronically Signed   By: Maisie Fus  Register   On: 11/21/2020 13:53    Microbiology: No results found for this or any previous visit (from the past 240 hour(s)).   Labs: Basic Metabolic Panel: Recent Labs  Lab 11/29/20 0142 11/30/20 0606 12/02/20 1022 12/03/20 0035  NA 137 138 141 141  K 4.4 5.3* 3.8 3.8  CL 104 108 111 112*  CO2 GLUCOSE 115*  89 98 112*  BUN 30* 26* 16 19  CREATININE 1.18 1.00 0.83 1.07  CALCIUM 9.3 8.8* 9.2 9.0  MG 2.1 1.8 1.9 2.0  PHOS  --   --  3.2 3.7   Liver Function Tests: Recent Labs  Lab 12/02/20 1022 12/03/20 0035  AST 13* 10*  ALT 12 15  ALKPHOS 62 67  BILITOT 0.3 0.3  PROT 6.6 6.3*  ALBUMIN 3.6 3.4*   No results for input(s): LIPASE, AMYLASE in the last 168 hours. No results for input(s): AMMONIA in the last 168 hours. CBC: Recent Labs  Lab 11/29/20 0142 11/30/20 0043 12/02/20 1022 12/03/20 0035  WBC 5.3 7.3 5.4 4.7  NEUTROABS  --   --  3.1 2.6  HGB 11.4* 11.5* 12.3* 11.9*  HCT 33.9* 35.3* 36.1* 35.1*  MCV 91.1 93.1 90.7 91.2  PLT 239 214 200 207   Cardiac Enzymes: No results for input(s): CKTOTAL, CKMB, CKMBINDEX, TROPONINI in the last 168 hours. BNP: BNP (last 3 results) Recent Labs    11/10/20 0155 11/11/20 0048 11/12/20 0336  BNP 5.2 4.2 4.4    ProBNP (last 3 results) No results for input(s): PROBNP in the last 8760 hours.  CBG: No results for input(s): GLUCAP in the last 168 hours.     Signed:  Lacretia Nicks MD.  [facility] 12/03/2020, 11:20 AM

## 2020-12-03 NOTE — TOC Transition Note (Signed)
Transition of Care [facility]) - CM/SW Discharge Note   Patient Details  Name: Ronald Butler MRN: [id number] Date of Birth: 03/20/1976  Transition of Care [facility] CM/SW Contact:  Lawerance Sabal, RN Phone Number: 12/03/2020, 11:55 AM   Clinical Narrative:    Confirmed with officer at bedside that patient will discharge to [facility]. Spoke with Sherrell at [facility] nurses [facility] the jail and faxed DC summary to [phone] per her request. Reviewed medications for Dc with her over the phone. Confirmed that it would be alright to send the patient with a walker and one was ordered and will be delivered to the room prior to him leaving.  Officer at bedside states that he will need to call in another officer to assist with transport and that officer is about 45 minutes away.     Final next level of care: [facility] Barriers to Discharge: No Barriers Identified   Patient Goals and CMS Choice Patient states their goals for this hospitalization and ongoing recovery are:: NA   Choice offered to / list presented to : NA  Discharge Placement                       Discharge Plan and Services                DME Arranged: Walker rolling DME Agency: [employer] Date DME Agency Contacted: 12/03/20 Time DME Agency Contacted: 1155 Representative spoke with at DME Agency: [facility] Agency: NA        Social Determinants of Health (SDOH) Interventions     Readmission Risk Interventions No flowsheet data found.

## 2020-12-05 ENCOUNTER — Emergency Department (HOSPITAL_COMMUNITY)
Admission: EM | Admit: 2020-12-05 | Discharge: 2020-12-05 | Disposition: A | Discharge status: Home / Self Care | Attending: Emergency Medicine | Admitting: Emergency Medicine

## 2020-12-05 ENCOUNTER — Other Ambulatory Visit: Payer: Self-pay | Admitting: Thoracic Surgery (Cardiothoracic Vascular Surgery)

## 2020-12-05 ENCOUNTER — Other Ambulatory Visit: Payer: Self-pay

## 2020-12-05 ENCOUNTER — Emergency Department (HOSPITAL_COMMUNITY)

## 2020-12-05 ENCOUNTER — Encounter (HOSPITAL_COMMUNITY): Payer: Self-pay | Admitting: Emergency Medicine

## 2020-12-05 DIAGNOSIS — J939 Pneumothorax, unspecified: Secondary | ICD-10-CM

## 2020-12-05 DIAGNOSIS — F17210 Nicotine dependence, cigarettes, uncomplicated: Secondary | ICD-10-CM | POA: Insufficient documentation

## 2020-12-05 DIAGNOSIS — R0789 Other chest pain: Secondary | ICD-10-CM | POA: Diagnosis not present

## 2020-12-05 DIAGNOSIS — R55 Syncope and collapse: Secondary | ICD-10-CM | POA: Diagnosis not present

## 2020-12-05 MED ORDER — HYDROCODONE-ACETAMINOPHEN 5-325 MG PO TABS
1.0000 | ORAL_TABLET | Freq: Once | ORAL | Status: AC
  Administered 2020-12-05: 1 via ORAL
  Filled 2020-12-05: qty 1

## 2020-12-05 MED ORDER — KETOROLAC TROMETHAMINE 30 MG/ML IJ SOLN
30.0000 mg | Freq: Once | INTRAMUSCULAR | Status: AC
  Administered 2020-12-05: 30 mg via INTRAMUSCULAR
  Filled 2020-12-05: qty 1

## 2020-12-05 NOTE — Discharge Instructions (Addendum)
There were no complications from from the recent surgery.  Rest, and use Tylenol or Motrin for pain.  Follow-up with your doctor as needed for problems.

## 2020-12-05 NOTE — ED Provider Notes (Signed)
[facility]-EMERGENCY DEPT Provider Note   CSN: [id number] Arrival date & time: 12/05/20  1846     History Chief Complaint  Patient presents with  . Loss of Consciousness    Ronald Butler is a 45 y.o. male.  HPI Patient presents after he was found unconscious in the jail cell.  It was thought that he might of had an overdose of narcotics that he was treated with Narcan.  There is no report of what happened after he was given the Narcan.  Currently, when I saw the patient, at 8:31 PM, he is complaining of right anterior chest pain.  He states he does not use illegal drugs.  He states he is not taking any medication for pain since hospital discharge, after treatment for recurrent pneumothorax.  Patient presents by EMS, he was entered into the emergency department at 6:51 PM this evening.  He has not had additional treatment in the interim.  He has not been obtunded since arrival.  Patient denies other recent problems.  Patient stated he did not want an IV or injection to treat his pain.  There are no other known active modifying factors.    Past Medical History:  Diagnosis Date  . Pancreatitis   . Pneumothorax   . Seizures [facility])     Patient Active Problem List   Diagnosis Date Noted  . Pneumothorax 10/28/2020  . Witnessed seizure-like activity (HCC) 04/26/2020  . 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 APPENDECTOMY    . CHOLECYSTECTOMY    . INTERCOSTAL NERVE BLOCK Right 10/30/2020   Procedure: INTERCOSTAL NERVE BLOCK;  Surgeon: Corliss Skains, MD;  Location: [facility] OR;  Service: Thoracic;  Laterality: Right;  Marland Kitchen VIDEO BRONCHOSCOPY WITH INSERTION OF INTERBRONCHIAL VALVE (IBV) N/A 11/28/2020   Procedure: VIDEO BRONCHOSCOPY WITH INSERTION OF INTERBRONCHIAL VALVE (IBV).;  Surgeon: Corliss Skains, MD;  Location: [facility] OR;  Service: Thoracic;  Laterality: N/A;  . XI ROBOTIC ASSISTED THORACOSCOPY  PLEURECTOMY Right 10/30/2020   Procedure: PLEURECTOMY;  Surgeon: Corliss Skains, MD;  Location: [facility] OR;  Service: Thoracic;  Laterality: Right;       Family History  Family history unknown: Yes    Social History   Tobacco Use  . Smoking status: Current Every Day Smoker    Packs/day: 2.00    Types: Cigarettes  . Smokeless tobacco: Never Used  Vaping Use  . Vaping Use: Never used  Substance Use Topics  . Alcohol use: Not Currently    Comment: occassional  . Drug use: Not Currently    Types: Marijuana    Home Medications Prior to Admission medications   Medication Sig Start Date End Date Taking? Authorizing Provider  acetaminophen (TYLENOL) 325 MG tablet Take 2 tablets (650 mg total) by mouth every 6 (six) hours as needed for mild pain (or Fever >/= 101). 04/27/20   Dorcas Carrow, MD  albuterol (VENTOLIN HFA) 108 (90 Base) MCG/ACT inhaler Inhale 2 puffs into the lungs every 6 (six) hours as needed for wheezing or shortness of breath. 12/03/20   Zigmund Daniel., MD  divalproex (DEPAKOTE ER) 250 MG 24 hr tablet Take 500 mg (2 tablets) in the morning, and 750 mg (3 tablets) in the evening Patient not taking: Reported on 10/29/2020 05/31/20   Dartha Lodge, PA-C  folic acid (FOLVITE) 1 MG tablet Take 1 tablet (1 mg total) by mouth daily. 12/03/20 12/03/21  Skeet Simmer  Montez Hageman., MD  ibuprofen (ADVIL) 400 MG tablet Take 1 tablet (400 mg total) by mouth every 6 (six) hours as needed for mild pain or moderate pain. 12/03/20   Zigmund Daniel., MD  Lacosamide 100 MG TABS Take 1 tablet (100 mg total) by mouth 2 (two) times daily. Patient not taking: Reported on 10/29/2020 05/06/20   Benjiman Core, MD  pantoprazole (PROTONIX) 40 MG tablet Take 1 tablet (40 mg total) by mouth daily. 12/04/20 01/03/21  Zigmund Daniel., MD  zonisamide (ZONEGRAN) 100 MG capsule Take 3 capsules (300 mg total) by mouth at bedtime. Patient not taking: Reported on 10/29/2020 04/30/20   Garlon Hatchet,  PA-C    Allergies    Patient has no known allergies.  Review of Systems   Review of Systems  All other systems reviewed and are negative.   Physical Exam Updated Vital Signs BP 114/79   Pulse 100   Temp 98.3 F (36.8 C) (Oral)   Resp (!) 27   Ht 5\' 11"  (1.803 m)   Wt 61.2 kg   SpO2 99%   BMI 18.83 kg/m   Physical Exam Vitals and nursing note reviewed.  Constitutional:      General: He is not in acute distress.    Appearance: He is well-developed. He is not ill-appearing, toxic-appearing or diaphoretic.  HENT:     Head: Normocephalic and atraumatic.     Right Ear: External ear normal.     Left Ear: External ear normal.  Eyes:     Conjunctiva/sclera: Conjunctivae normal.     Pupils: Pupils are equal, round, and reactive to light.  Neck:     Trachea: Phonation normal.  Cardiovascular:     Rate and Rhythm: Normal rate and regular rhythm.     Heart sounds: Normal heart sounds.  Pulmonary:     Effort: Pulmonary effort is normal.     Breath sounds: Normal breath sounds.  Chest:     Chest wall: Tenderness (Right lateral.  Bandage on wound right lateral.) present.  Abdominal:     Palpations: Abdomen is soft.     Tenderness: There is no abdominal tenderness.  Musculoskeletal:        General: Normal range of motion.     Cervical back: Normal range of motion and neck supple.  Skin:    General: Skin is warm and dry.  Neurological:     Mental Status: He is alert and oriented to person, place, and time.     Cranial Nerves: No cranial nerve deficit.     Sensory: No sensory deficit.     Motor: No abnormal muscle tone.     Coordination: Coordination normal.  Psychiatric:        Mood and Affect: Mood normal.        Behavior: Behavior normal.        Thought Content: Thought content normal.        Judgment: Judgment normal.     ED Results / Procedures / Treatments   Labs (all labs ordered are listed, but only abnormal results are displayed) Labs Reviewed - No data to  display  EKG None  Radiology No results found.  Procedures Procedures   Medications Ordered in ED Medications - No data to display  ED Course  I have reviewed the triage vital signs and the nursing notes.  Pertinent labs & imaging results that were available during my care of the patient were reviewed by me and considered in my  medical decision making (see chart for details).    MDM Rules/Calculators/A&P                           Patient Vitals for the past 24 hrs:  BP Temp Temp src Pulse Resp SpO2 Height Weight  12/05/20 1930 114/79 -- -- 100 (!) 27 99 % -- --  12/05/20 1915 117/74 -- -- (!) 104 17 100 % -- --  12/05/20 1903 -- -- -- -- -- -- 5\' 11"  (1.803 m) 61.2 kg  12/05/20 1900 116/75 98.3 F (36.8 C) Oral (!) 106 (!) 25 100 % -- --    At time of discharge- reevaluation with update and discussion. After initial assessment and treatment, an updated evaluation reveals he is comfortable and has no further complaints.02/04/21   Medical Decision Making:  This patient is presenting for evaluation of chest pain, which does require a range of treatment options, and is a complaint that involves a moderate risk of morbidity and mortality. The differential diagnoses include wound infection, pneumothorax, complication from recent surgery. I decided to review old records, and in summary patient currently incarcerated presenting with chest pain, following pulmonary procedure several weeks ago.  He had bronchial stenting for recurrent pneumothorax.  I did not require additional historical information from anyone.   Radiologic Tests Ordered, included chest x-[place].  I independently Visualized: Radiographic images, which show no acute abnormality    Critical Interventions-clinical evaluation, radiography, medication treatment, observation and reassessment  After These Interventions, the Patient was reevaluated and was found stable for discharge.  Patient's chest wound is  healing well without signs of complication.  Chest x-[place] is normal.  Patient's pain improved after treatment.  No indication for ongoing treatment in the ED, hospitalization or change in current management at his facility.  CRITICAL CARE-no Performed by: Mancel Bale  Nursing Notes Reviewed/ Care Coordinated Applicable Imaging Reviewed Interpretation of Laboratory Data incorporated into ED treatment  The patient appears reasonably screened and/or stabilized for discharge and I doubt any other medical condition or other [facility] requiring further screening, evaluation, or treatment in the ED at this time prior to discharge.  Plan: Home Medications-no change; Home Treatments-usual wound care; return here if the recommended treatment, does not improve the symptoms; Recommended follow up-PCP as needed     Final Clinical Impression(s) / ED Diagnoses Final diagnoses:  None    Rx / DC Orders ED Discharge Orders    None       [facility], MD 12/06/20 1151

## 2020-12-05 NOTE — ED Triage Notes (Addendum)
[facility] transferred pt from [facility] and reports the following:  Staff noticed pt was unconscious in his room. Staff gave 8 mg narcan IN prior to EMS arrival. Pt was discharged from [facility] on 12/03/20 after a month long stay which included a collapsed lung. Since that time he has had SOB and dizziness. Last night he reports coughing up bright red blood. Hx of seizures which are well controlled. Last seizure was months ago. No seizure like activity noticed today. Denies drug use. EMS gave 324 mg Asprin. BP 104/84 HR 110 O2 99 12 lead unremarkable

## 2020-12-08 ENCOUNTER — Inpatient Hospital Stay (HOSPITAL_COMMUNITY)
Admission: EM | Admit: 2020-12-08 | Discharge: 2020-12-11 | DRG: 101 | Attending: Family Medicine | Admitting: Family Medicine

## 2020-12-08 ENCOUNTER — Emergency Department (HOSPITAL_COMMUNITY)

## 2020-12-08 ENCOUNTER — Encounter (HOSPITAL_COMMUNITY): Payer: Self-pay | Admitting: Emergency Medicine

## 2020-12-08 ENCOUNTER — Other Ambulatory Visit: Payer: Self-pay

## 2020-12-08 ENCOUNTER — Telehealth: Payer: Self-pay | Admitting: Thoracic Surgery (Cardiothoracic Vascular Surgery)

## 2020-12-08 DIAGNOSIS — Z79899 Other long term (current) drug therapy: Secondary | ICD-10-CM

## 2020-12-08 DIAGNOSIS — R569 Unspecified convulsions: Secondary | ICD-10-CM | POA: Diagnosis not present

## 2020-12-08 DIAGNOSIS — Z20822 Contact with and (suspected) exposure to covid-19: Secondary | ICD-10-CM | POA: Diagnosis present

## 2020-12-08 DIAGNOSIS — E876 Hypokalemia: Secondary | ICD-10-CM | POA: Diagnosis present

## 2020-12-08 DIAGNOSIS — G40909 Epilepsy, unspecified, not intractable, without status epilepticus: Secondary | ICD-10-CM | POA: Diagnosis not present

## 2020-12-08 DIAGNOSIS — Z66 Do not resuscitate: Secondary | ICD-10-CM | POA: Diagnosis present

## 2020-12-08 DIAGNOSIS — T426X6A Underdosing of other antiepileptic and sedative-hypnotic drugs, initial encounter: Secondary | ICD-10-CM | POA: Diagnosis present

## 2020-12-08 DIAGNOSIS — Z87891 Personal history of nicotine dependence: Secondary | ICD-10-CM

## 2020-12-08 DIAGNOSIS — Z91138 Patient's unintentional underdosing of medication regimen for other reason: Secondary | ICD-10-CM

## 2020-12-08 LAB — CBC
HCT: 33 % — ABNORMAL LOW (ref 39.0–52.0)
Hemoglobin: 11.3 g/dL — ABNORMAL LOW (ref 13.0–17.0)
MCH: 31.7 pg (ref 26.0–34.0)
MCHC: 34.2 g/dL (ref 30.0–36.0)
MCV: 92.4 fL (ref 80.0–100.0)
Platelets: 248 10*3/uL (ref 150–400)
RBC: 3.57 MIL/uL — ABNORMAL LOW (ref 4.22–5.81)
RDW: 14.1 % (ref 11.5–15.5)
WBC: 5.7 10*3/uL (ref 4.0–10.5)
nRBC: 0 % (ref 0.0–0.2)

## 2020-12-08 LAB — BASIC METABOLIC PANEL
Anion gap: 7 (ref 5–15)
BUN: 20 mg/dL (ref 6–20)
CO2: 24 mmol/L (ref 22–32)
Calcium: 9.2 mg/dL (ref 8.9–10.3)
Chloride: 111 mmol/L (ref 98–111)
Creatinine, Ser: 0.79 mg/dL (ref 0.61–1.24)
GFR, Estimated: >60 mL/min (ref >60)
Glucose, Bld: 99 mg/dL (ref 70–99)
Potassium: 3.7 mmol/L (ref 3.5–5.1)
Sodium: 142 mmol/L (ref 135–145)

## 2020-12-08 LAB — CBG MONITORING, ED: Glucose-Capillary: 102 mg/dL — ABNORMAL HIGH (ref 70–99)

## 2020-12-08 LAB — VALPROIC ACID LEVEL: Valproic Acid Lvl: 38 ug/mL — ABNORMAL LOW (ref 50.0–100.0)

## 2020-12-08 MED ORDER — LORAZEPAM 2 MG/ML IJ SOLN
2.0000 mg | Freq: Once | INTRAMUSCULAR | Status: AC
  Administered 2020-12-08: 2 mg via INTRAVENOUS

## 2020-12-08 MED ORDER — LORAZEPAM 2 MG/ML IJ SOLN
INTRAMUSCULAR | Status: AC
  Filled 2020-12-08: qty 1

## 2020-12-08 MED ORDER — LORAZEPAM 2 MG/ML IJ SOLN: 2.0000 mg | INTRAMUSCULAR | Status: DC | PRN

## 2020-12-08 MED ORDER — ACETAMINOPHEN 325 MG PO TABS
650.0000 mg | ORAL_TABLET | Freq: Once | ORAL | Status: AC
  Administered 2020-12-08: 650 mg via ORAL
  Filled 2020-12-08: qty 2

## 2020-12-08 MED ORDER — LEVETIRACETAM IN NACL 1000 MG/100ML IV SOLN
1000.0000 mg | Freq: Once | INTRAVENOUS | Status: AC
  Administered 2020-12-08: 1000 mg via INTRAVENOUS
  Filled 2020-12-08: qty 100

## 2020-12-08 MED ORDER — DIVALPROEX SODIUM ER 250 MG PO TB24
250.0000 mg | ORAL_TABLET | Freq: Every day | ORAL | Status: DC
  Administered 2020-12-09: 250 mg via ORAL
  Filled 2020-12-08: qty 1

## 2020-12-08 NOTE — ED Notes (Signed)
Pt noted to be having seizure-like activity. Provider Britni, PA called to bedside.

## 2020-12-08 NOTE — ED Provider Notes (Signed)
[facility]-EMERGENCY DEPT Provider Note   CSN: [id number] Arrival date & time: 12/08/20  2104    History Chief Complaint  Patient presents with   Seizures    Ronald Butler is a 45 y.o. male with history significant for pseudoseizures, seizures, pancreatitis, recurrent pneumothorax who presents for evaluation of seizure.  Ar[facility] with EMS from jail.  Apparently witnessed seizure-like activity x 2.  He is reportedly compliant with his Depakote.  Was found on jail floor with emesis and his seizure-like activity.  He apparently bit his tongue and post ictal on ems arrival.  Was given Versed by EMS for additional seizure like activity.  On arrival he is postictal. Patient states he was supposed to be on Keppra on prior discharge however no Rx for this.  Level 5 caveat-altered mental status, postictal  HPI     Past Medical History:  Diagnosis Date   Pancreatitis    Pneumothorax    Seizures (HCC)     Patient Active Problem List   Diagnosis Date Noted   Pneumothorax 10/28/2020   Witnessed seizure-like activity (HCC) 04/26/2020   Recurrent spontaneous pneumothorax 04/24/2017    Past Surgical History:  Procedure Laterality Date   APPENDECTOMY     CHOLECYSTECTOMY     INTERCOSTAL NERVE BLOCK Right 10/30/2020   Procedure: INTERCOSTAL NERVE BLOCK;  Surgeon: Corliss Skains, MD;  Location: [facility] OR;  Service: Thoracic;  Laterality: Right;   VIDEO BRONCHOSCOPY WITH INSERTION OF INTERBRONCHIAL VALVE (IBV) N/A 11/28/2020   Procedure: VIDEO BRONCHOSCOPY WITH INSERTION OF INTERBRONCHIAL VALVE (IBV).;  Surgeon: Corliss Skains, MD;  Location: [facility] OR;  Service: Thoracic;  Laterality: N/A;   XI ROBOTIC ASSISTED THORACOSCOPY PLEURECTOMY Right 10/30/2020   Procedure: PLEURECTOMY;  Surgeon: Corliss Skains, MD;  Location: [facility] OR;  Service: Thoracic;  Laterality: Right;       Family History  Family history unknown: Yes    Social History   Tobacco Use    Smoking status: Every Day    Packs/day: 2.00    Pack years: 0.00    Types: Cigarettes   Smokeless tobacco: Never  Vaping Use   Vaping Use: Never used  Substance Use Topics   Alcohol use: Not Currently    Comment: occassional   Drug use: Not Currently    Types: Marijuana    Home Medications Prior to Admission medications   Medication Sig Start Date End Date Taking? Authorizing Provider  acetaminophen (TYLENOL) 325 MG tablet Take 2 tablets (650 mg total) by mouth every 6 (six) hours as needed for mild pain (or Fever >/= 101). 04/27/20   Dorcas Carrow, MD  albuterol (VENTOLIN HFA) 108 (90 Base) MCG/ACT inhaler Inhale 2 puffs into the lungs every 6 (six) hours as needed for wheezing or shortness of breath. 12/03/20   Zigmund Daniel., MD  divalproex (DEPAKOTE ER) 250 MG 24 hr tablet Take 500 mg (2 tablets) in the morning, and 750 mg (3 tablets) in the evening Patient not taking: Reported on 10/29/2020 05/31/20   Dartha Lodge, PA-C  folic acid (FOLVITE) 1 MG tablet Take 1 tablet (1 mg total) by mouth daily. 12/03/20 12/03/21  Zigmund Daniel., MD  ibuprofen (ADVIL) 400 MG tablet Take 1 tablet (400 mg total) by mouth every 6 (six) hours as needed for mild pain or moderate pain. 12/03/20   Zigmund Daniel., MD  Lacosamide 100 MG TABS Take 1 tablet (100 mg total) by mouth 2 (two) times daily.  Patient not taking: Reported on 10/29/2020 05/06/20   Benjiman Core, MD  pantoprazole (PROTONIX) 40 MG tablet Take 1 tablet (40 mg total) by mouth daily. 12/04/20 01/03/21  Zigmund Daniel., MD  zonisamide (ZONEGRAN) 100 MG capsule Take 3 capsules (300 mg total) by mouth at bedtime. Patient not taking: Reported on 10/29/2020 04/30/20   Garlon Hatchet, PA-C    Allergies    Patient has no known allergies.  Review of Systems   Review of Systems  Unable to perform ROS: Mental status change  Constitutional: Negative.   HENT: Negative.    Respiratory: Negative.    Cardiovascular: Negative.    Gastrointestinal: Negative.   Genitourinary: Negative.   Musculoskeletal: Negative.   Skin: Negative.   Neurological:  Positive for seizures.  All other systems reviewed and are negative.  Physical Exam Updated Vital Signs BP 108/80   Pulse (!) 101   Temp 99 F (37.2 C) (Oral)   Resp 16   Ht 5\' 11"  (1.803 m)   Wt 61.2 kg   SpO2 98%   BMI 18.82 kg/m   Physical Exam Vitals and nursing note reviewed.  Constitutional:      General: He is not in acute distress.    Appearance: He is well-developed. He is not ill-appearing, toxic-appearing or diaphoretic.  HENT:     Head: Normocephalic and atraumatic.     Jaw: There is normal jaw occlusion.     Comments: No obvious traumatic injuries    Mouth/Throat:     Lips: Pink.     Mouth: Mucous membranes are moist.     Pharynx: Oropharynx is clear. Uvula midline.     Comments: Mild erythema to distal tip of tongue Eyes:     Pupils: Pupils are equal, round, and reactive to light.  Cardiovascular:     Rate and Rhythm: Normal rate and regular rhythm.     Pulses: Normal pulses.     Heart sounds: Normal heart sounds.  Pulmonary:     Effort: Pulmonary effort is normal. No respiratory distress.     Breath sounds: Normal breath sounds.  Abdominal:     General: Bowel sounds are normal. There is no distension.     Palpations: Abdomen is soft.     Tenderness: There is no abdominal tenderness. There is no guarding or rebound.  Musculoskeletal:        General: No swelling or tenderness. Normal range of motion.     Cervical back: Normal range of motion and neck supple.  Skin:    General: Skin is warm and dry.     Capillary Refill: Capillary refill takes less than 2 seconds.  Neurological:     Mental Status: He is confused.     Comments: Postictal Moves all 4 extremities without difficulty    ED Results / Procedures / Treatments   Labs (all labs ordered are listed, but only abnormal results are displayed) Labs Reviewed  VALPROIC ACID  LEVEL - Abnormal; Notable for the following components:      Result Value   Valproic Acid Lvl 38 (*)    All other components within normal limits  CBC - Abnormal; Notable for the following components:   RBC 3.57 (*)    Hemoglobin 11.3 (*)    HCT 33.0 (*)    All other components within normal limits  CBG MONITORING, ED - Abnormal; Notable for the following components:   Glucose-Capillary 102 (*)    All other components within normal limits  BASIC METABOLIC PANEL    EKG EKG Interpretation  Date/Time:  Friday December 08 2020 21:16:51 EDT Ventricular Rate:  94 PR Interval:  129 QRS Duration: 91 QT Interval:  342 QTC Calculation: 428 R Axis:   84 Text Interpretation: Sinus rhythm No significant change since last tracing Confirmed by Melene Plan [phone]) on 12/08/2020 11:01:36 PM  Radiology CT Head Wo Contrast  Result Date: 12/08/2020 CLINICAL DATA:  Seizure, fall EXAM: CT HEAD WITHOUT CONTRAST TECHNIQUE: Contiguous axial images were obtained from the base of the skull through the vertex without intravenous contrast. COMPARISON:  05/30/2020 FINDINGS: Brain: No acute intracranial abnormality. Specifically, no hemorrhage, hydrocephalus, mass lesion, acute infarction, or significant intracranial injury. Vascular: No hyperdense vessel or unexpected calcification. Skull: No acute calvarial abnormality. Sinuses/Orbits: No acute findings Other: None IMPRESSION: Normal study. Electronically Signed   By: Charlett Nose M.D.   On: 12/08/2020 22:34   CT Cervical Spine Wo Contrast  Result Date: 12/08/2020 CLINICAL DATA:  Seizure EXAM: CT CERVICAL SPINE WITHOUT CONTRAST TECHNIQUE: Multidetector CT imaging of the cervical spine was performed without intravenous contrast. Multiplanar CT image reconstructions were also generated. COMPARISON:  None FINDINGS: Alignment: Normal Skull base and vertebrae: No acute fracture. No primary bone lesion or focal pathologic process. Soft tissues and spinal canal: No  prevertebral fluid or swelling. No visible canal hematoma. Disc levels: Prior ACDF at C5-6. Remainder of the disc spaces maintained. Upper chest: Postoperative changes in the right apex with scarring. Other: None IMPRESSION: No acute bony abnormality. Electronically Signed   By: Charlett Nose M.D.   On: 12/08/2020 22:36   DG Chest Portable 1 View  Result Date: 12/08/2020 CLINICAL DATA:  Cough and seizure activity EXAM: PORTABLE CHEST 1 VIEW COMPARISON:  12/05/2020 FINDINGS: Cardiac shadow is stable. Multiple endobronchial valves are seen on the right. Lungs are clear. No pneumothorax or focal infiltrate is seen. No acute bony abnormality is noted. IMPRESSION: Multiple endobronchial valves are noted on the right. No acute abnormality seen. Electronically Signed   By: Alcide Clever M.D.   On: 12/08/2020 21:43    Procedures Procedures   Medications Ordered in ED Medications  LORazepam (ATIVAN) 2 MG/ML injection (has no administration in time range)  divalproex (DEPAKOTE ER) 24 hr tablet 250 mg (has no administration in time range)  acetaminophen (TYLENOL) tablet 650 mg (has no administration in time range)  levETIRAcetam (KEPPRA) IVPB 1000 mg/100 mL premix (0 mg Intravenous Stopped 12/08/20 2155)  LORazepam (ATIVAN) injection 2 mg (2 mg Intravenous Given 12/08/20 2242)    ED Course  I have reviewed the triage vital signs and the nursing notes.  Pertinent labs & imaging results that were available during my care of the patient were reviewed by me and considered in my medical decision making (see chart for details).  45 year old currently incarcerated presents for evaluation of seizure-like activity.  He is afebrile, nonseptic, not ill-appearing.  Was given Versed by EMS, therefore drowsy and postictal on arrival.  He does move all 4 extremities spontaneously.  Pupils equal reactive to light.  Heart and lungs clear but abdomen soft, nontender.  Apparently he has been compliant with his Depakote per  Sheriff's office.  Stable vital signs. Started on Keppra loading dose. Plan on labs, imaging and reassess.  Patient reassessed.  States he has been taking his medications as prescribed.  States he felt "off" and had what he believes is a seizure.  The next remembers he is here in the ED.  He denies any  pain, shortness of breath, paresthesias or weakness.  No infectious symptoms.  Labs and imaging personally reviewed and interpreted:  DG chest without acute abnormality CT head without acute findings CT cervical without acute findings CBC without leukocytosis BMP without acute findings Depakote level 38  Patient reassessed. Additional seizure activity. Did not stop for >2 minutes so Ativan was given with cessation of seizure activity.  Plan to consult with neurology given for seizure-like episodes over the last 6 hours.  CONSULT with Dr. Derry LoryKhaliqdina with Neurology who recommends admission for observation. [facility]. Recommends Depakote home dose.  CONSULT with Dr. Dorthula PerfectZierleCamillo Flaming- Ghosh with [facility] who agrees to evaluate patient for admission.  The patient appears reasonably stabilized for admission considering the current resources, flow, and capabilities available in the ED at this time, and [facility] doubt [facility] and/or treatment in the ED prior to admission.     MDM Rules/Calculators/A&P                           Final Clinical Impression(s) / ED Diagnoses Final diagnoses:  Seizure [facility](HCC)    Rx / DC Orders ED Discharge Orders     None        Burle Kwan A, PA-C 12/08/20 2314    Melene PlanFloyd, Dan, DO 12/08/20 2316

## 2020-12-08 NOTE — ED Triage Notes (Signed)
Pt BIB EMS from jail in custody. Pt found seizing by jail staff and seized again upon EMS arrival. 5 mg versed IM given PTA. Hx of seizures. Mildly post-ictal. On Depakote. 18G LFA. CBG 120. VSS.

## 2020-12-08 NOTE — ED Notes (Signed)
Seizure-like activity resolved at this time. Pt resting.

## 2020-12-08 NOTE — ED Notes (Addendum)
ED Provider at bedside. Verbal order for 2 MG IV Ativan given by Britni, PA.

## 2020-12-09 DIAGNOSIS — T426X6A Underdosing of other antiepileptic and sedative-hypnotic drugs, initial encounter: Secondary | ICD-10-CM | POA: Diagnosis present

## 2020-12-09 DIAGNOSIS — R569 Unspecified convulsions: Secondary | ICD-10-CM

## 2020-12-09 DIAGNOSIS — E876 Hypokalemia: Secondary | ICD-10-CM | POA: Diagnosis present

## 2020-12-09 DIAGNOSIS — G40909 Epilepsy, unspecified, not intractable, without status epilepticus: Secondary | ICD-10-CM | POA: Diagnosis present

## 2020-12-09 DIAGNOSIS — Z20822 Contact with and (suspected) exposure to covid-19: Secondary | ICD-10-CM | POA: Diagnosis present

## 2020-12-09 DIAGNOSIS — Z91138 Patient's unintentional underdosing of medication regimen for other reason: Secondary | ICD-10-CM | POA: Diagnosis not present

## 2020-12-09 DIAGNOSIS — Z79899 Other long term (current) drug therapy: Secondary | ICD-10-CM | POA: Diagnosis not present

## 2020-12-09 DIAGNOSIS — Z87891 Personal history of nicotine dependence: Secondary | ICD-10-CM | POA: Diagnosis not present

## 2020-12-09 DIAGNOSIS — Z66 Do not resuscitate: Secondary | ICD-10-CM | POA: Diagnosis present

## 2020-12-09 LAB — COMPREHENSIVE METABOLIC PANEL
ALT: 11 U/L (ref 0–44)
AST: 13 U/L — ABNORMAL LOW (ref 15–41)
Albumin: 3.5 g/dL (ref 3.5–5.0)
Alkaline Phosphatase: 54 U/L (ref 38–126)
Anion gap: 9 (ref 5–15)
BUN: 18 mg/dL (ref 6–20)
CO2: 22 mmol/L (ref 22–32)
Calcium: 9.1 mg/dL (ref 8.9–10.3)
Chloride: 110 mmol/L (ref 98–111)
Creatinine, Ser: 0.86 mg/dL (ref 0.61–1.24)
GFR, Estimated: >60 mL/min (ref >60)
Glucose, Bld: 142 mg/dL — ABNORMAL HIGH (ref 70–99)
Potassium: 3.3 mmol/L — ABNORMAL LOW (ref 3.5–5.1)
Sodium: 141 mmol/L (ref 135–145)
Total Bilirubin: 0.4 mg/dL (ref 0.3–1.2)
Total Protein: 6.3 g/dL — ABNORMAL LOW (ref 6.5–8.1)

## 2020-12-09 LAB — CBC WITH DIFFERENTIAL/PLATELET
Abs Immature Granulocytes: 0.01 10*3/uL (ref 0.00–0.07)
Basophils Absolute: 0 10*3/uL (ref 0.0–0.1)
Basophils Relative: 1 %
Eosinophils Absolute: 0.1 10*3/uL (ref 0.0–0.5)
Eosinophils Relative: 1 %
HCT: 33.6 % — ABNORMAL LOW (ref 39.0–52.0)
Hemoglobin: 11.3 g/dL — ABNORMAL LOW (ref 13.0–17.0)
Immature Granulocytes: 0 %
Lymphocytes Relative: 33 %
Lymphs Abs: 2.1 10*3/uL (ref 0.7–4.0)
MCH: 31.1 pg (ref 26.0–34.0)
MCHC: 33.6 g/dL (ref 30.0–36.0)
MCV: 92.6 fL (ref 80.0–100.0)
Monocytes Absolute: 0.6 10*3/uL (ref 0.1–1.0)
Monocytes Relative: 9 %
Neutro Abs: 3.5 10*3/uL (ref 1.7–7.7)
Neutrophils Relative %: 56 %
Platelets: 238 10*3/uL (ref 150–400)
RBC: 3.63 MIL/uL — ABNORMAL LOW (ref 4.22–5.81)
RDW: 14.1 % (ref 11.5–15.5)
WBC: 6.2 10*3/uL (ref 4.0–10.5)
nRBC: 0 % (ref 0.0–0.2)

## 2020-12-09 LAB — RESP PANEL BY RT-PCR (FLU A&B, COVID) ARPGX2
Influenza A by PCR: NEGATIVE
Influenza B by PCR: NEGATIVE
SARS Coronavirus 2 by RT PCR: NEGATIVE

## 2020-12-09 LAB — MAGNESIUM: Magnesium: 1.9 mg/dL (ref 1.7–2.4)

## 2020-12-09 MED ORDER — ZONISAMIDE 100 MG PO CAPS
300.0000 mg | ORAL_CAPSULE | Freq: Every day | ORAL | Status: DC
  Administered 2020-12-09 – 2020-12-10 (×3): 300 mg via ORAL
  Filled 2020-12-09 (×4): qty 3

## 2020-12-09 MED ORDER — ONDANSETRON HCL 4 MG PO TABS: 4.0000 mg | ORAL_TABLET | Freq: Four times a day (QID) | ORAL | Status: DC | PRN

## 2020-12-09 MED ORDER — ALBUTEROL SULFATE (2.5 MG/3ML) 0.083% IN NEBU: 2.5000 mg | INHALATION_SOLUTION | Freq: Four times a day (QID) | RESPIRATORY_TRACT | Status: DC | PRN

## 2020-12-09 MED ORDER — ENOXAPARIN SODIUM 40 MG/0.4ML IJ SOSY
40.0000 mg | PREFILLED_SYRINGE | INTRAMUSCULAR | Status: DC
  Filled 2020-12-09: qty 0.4

## 2020-12-09 MED ORDER — DIVALPROEX SODIUM 500 MG PO DR TAB
750.0000 mg | DELAYED_RELEASE_TABLET | Freq: Every day | ORAL | Status: DC
  Administered 2020-12-09 – 2020-12-10 (×2): 750 mg via ORAL
  Filled 2020-12-09 (×3): qty 1

## 2020-12-09 MED ORDER — OXYCODONE HCL 5 MG PO TABS
5.0000 mg | ORAL_TABLET | ORAL | Status: DC | PRN
  Administered 2020-12-09 (×3): 5 mg via ORAL
  Filled 2020-12-09 (×3): qty 1

## 2020-12-09 MED ORDER — ACETAMINOPHEN 325 MG PO TABS
650.0000 mg | ORAL_TABLET | Freq: Four times a day (QID) | ORAL | Status: DC | PRN
  Administered 2020-12-09 – 2020-12-11 (×4): 650 mg via ORAL
  Filled 2020-12-09 (×4): qty 2

## 2020-12-09 MED ORDER — FOLIC ACID 1 MG PO TABS
1.0000 mg | ORAL_TABLET | Freq: Every day | ORAL | Status: DC
  Administered 2020-12-09 – 2020-12-11 (×3): 1 mg via ORAL
  Filled 2020-12-09 (×3): qty 1

## 2020-12-09 MED ORDER — HYDROMORPHONE HCL 1 MG/ML IJ SOLN
1.0000 mg | INTRAMUSCULAR | Status: DC | PRN
  Administered 2020-12-09 – 2020-12-11 (×11): 1 mg via INTRAVENOUS
  Filled 2020-12-09 (×12): qty 1

## 2020-12-09 MED ORDER — PANTOPRAZOLE SODIUM 40 MG PO TBEC
40.0000 mg | DELAYED_RELEASE_TABLET | Freq: Every day | ORAL | Status: DC
  Administered 2020-12-09 – 2020-12-11 (×3): 40 mg via ORAL
  Filled 2020-12-09 (×3): qty 1

## 2020-12-09 MED ORDER — METHOCARBAMOL 500 MG PO TABS
500.0000 mg | ORAL_TABLET | Freq: Four times a day (QID) | ORAL | Status: DC
  Administered 2020-12-09 – 2020-12-11 (×9): 500 mg via ORAL
  Filled 2020-12-09 (×9): qty 1

## 2020-12-09 MED ORDER — HEPARIN SODIUM (PORCINE) 5000 UNIT/ML IJ SOLN
5000.0000 [IU] | Freq: Three times a day (TID) | INTRAMUSCULAR | Status: DC
  Filled 2020-12-09: qty 1

## 2020-12-09 MED ORDER — HYDROMORPHONE HCL 1 MG/ML IJ SOLN
1.0000 mg | Freq: Once | INTRAMUSCULAR | Status: AC
  Administered 2020-12-09: 1 mg via INTRAVENOUS
  Filled 2020-12-09: qty 1

## 2020-12-09 MED ORDER — ACETAMINOPHEN 650 MG RE SUPP: 650.0000 mg | Freq: Four times a day (QID) | RECTAL | Status: DC | PRN

## 2020-12-09 MED ORDER — HEPARIN SODIUM (PORCINE) 5000 UNIT/ML IJ SOLN: 5000.0000 [IU] | Freq: Three times a day (TID) | INTRAMUSCULAR | Status: DC

## 2020-12-09 MED ORDER — ONDANSETRON HCL 4 MG/2ML IJ SOLN: 4.0000 mg | Freq: Four times a day (QID) | INTRAMUSCULAR | Status: DC | PRN

## 2020-12-09 MED ORDER — LEVETIRACETAM 500 MG PO TABS
500.0000 mg | ORAL_TABLET | Freq: Two times a day (BID) | ORAL | Status: DC
  Administered 2020-12-09 – 2020-12-10 (×4): 500 mg via ORAL
  Filled 2020-12-09 (×4): qty 1

## 2020-12-09 MED ORDER — DIVALPROEX SODIUM 500 MG PO DR TAB
500.0000 mg | DELAYED_RELEASE_TABLET | Freq: Every day | ORAL | Status: DC
  Administered 2020-12-10 – 2020-12-11 (×2): 500 mg via ORAL
  Filled 2020-12-09 (×2): qty 1

## 2020-12-09 MED ORDER — ALBUTEROL SULFATE HFA 108 (90 BASE) MCG/ACT IN AERS: 2.0000 | INHALATION_SPRAY | Freq: Four times a day (QID) | RESPIRATORY_TRACT | Status: DC | PRN

## 2020-12-09 MED ORDER — LACOSAMIDE 50 MG PO TABS
100.0000 mg | ORAL_TABLET | Freq: Two times a day (BID) | ORAL | Status: DC
  Administered 2020-12-09 – 2020-12-11 (×4): 100 mg via ORAL
  Filled 2020-12-09 (×4): qty 2
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
## 2020-12-09 NOTE — Plan of Care (Signed)
POC initiated and progressing. 

## 2020-12-09 NOTE — ED Notes (Signed)
[facility] called to transport pt to [facility]

## 2020-12-09 NOTE — ED Notes (Signed)
ED TO INPATIENT HANDOFF REPORT  Name/Age/Gender Ronald Butler 45 y.o. male  Code Status    Code Status Orders  (From admission, onward)         Start     Ordered   12/09/20 0130  Do not attempt resuscitation (DNR)  Continuous       Question Answer Comment  In the event of cardiac or respiratory ARREST Do not call a "code blue"   In the event of cardiac or respiratory ARREST Do not perform Intubation, CPR, defibrillation or ACLS   In the event of cardiac or respiratory ARREST Use medication by any route, position, wound care, and other measures to relive pain and suffering. May use oxygen, suction and manual treatment of airway obstruction as needed for comfort.      12/09/20 0130        Code Status History    Date Active Date Inactive Code Status Order ID Comments User Context   10/28/2020 0736 12/03/2020 1854 Full Code [id number]  Erick Blinks, DO ED   04/27/2020 0447 04/27/2020 1828 Full Code [id number]  Zierle-Ghosh, Asia B, DO ED   04/26/2020 2104 04/27/2020 0447 Full Code [id number]  Zierle-Ghosh, Asia B, DO ED   04/24/2017 1639 04/24/2017 2229 Full Code [id number]  Alleen Borne, MD Inpatient      Home/SNF/Other Home (jail)  Chief Complaint Seizure [name] [R56.9]  Level of Care/Admitting Diagnosis ED Disposition    ED Disposition  Admit   Condition  --   Comment  Hospital Area: [facility] [[id number]]  Level of Care: Telemetry Medical [104]  May place patient in observation at [facility] or [facility] if equivalent level of care is available:: No  Covid Evaluation: Asymptomatic Screening Protocol (No Symptoms)  Diagnosis: Seizure [facility]) [205090]  Admitting Physician: Lilyan Gilford [[id number]]  Attending Physician: Lilyan Gilford [id number]         Medical History Past Medical History:  Diagnosis Date  . Pancreatitis   . Pneumothorax   . Seizures (HCC)     Allergies No Known Allergies  IV  Location/Drains/Wounds Patient Lines/Drains/Airways Status    Active Line/Drains/Airways    Name Placement date Placement time Site Days   Peripheral IV 12/08/20 20 G Anterior;Left Forearm 12/08/20  --  Forearm  1   Incision (Closed) 10/30/20 Abdomen Right 10/30/20  1508  -- 40          Labs/Imaging Results for orders placed or performed during the hospital encounter of 12/08/20 (from the past 48 hour(s))  Valproic Acid (depakote) Level (if patient is taking this medication)     Status: Abnormal   Collection Time: 12/08/20  9:13 PM  Result Value Ref Range   Valproic Acid Lvl 38 (L) 50.0 - 100.0 ug/mL    Comment: Performed at [facility], [address], [place], [place] [address]  Basic metabolic panel - if new onset seizures     Status: None   Collection Time: 12/08/20  9:13 PM  Result Value Ref Range   Sodium 142 135 - 145 mmol/L   Potassium 3.7 3.5 - 5.1 mmol/L   Chloride 111 98 - 111 mmol/L   CO2 24 22 - 32 mmol/L   Glucose, Bld 99 70 - 99 mg/dL    Comment: Glucose reference range applies only to samples taken after fasting for at least 8 hours.   BUN 20 6 - 20 mg/dL   Creatinine, Ser 8.93 0.61 - 1.24 mg/dL  Calcium 9.2 8.9 - 10.3 mg/dL   GFR, Estimated >32 >20 mL/min    Comment: (NOTE) Calculated using the CKD-EPI Creatinine Equation (2021)    Anion gap 7 5 - 15    Comment: Performed at [facility], [address], [place], [place] [address]  CBC - if new onset seizures     Status: Abnormal   Collection Time: 12/08/20  9:13 PM  Result Value Ref Range   WBC 5.7 4.0 - 10.5 K/uL   RBC 3.57 (L) 4.22 - 5.81 MIL/uL   Hemoglobin 11.3 (L) 13.0 - 17.0 g/dL   HCT 06.2 (L) 37.6 - 28.3 %   MCV 92.4 80.0 - 100.0 fL   MCH 31.7 26.0 - 34.0 pg   MCHC 34.2 30.0 - 36.0 g/dL   RDW 15.1 76.1 - 60.7 %   Platelets 248 150 - 400 K/uL   nRBC 0.0 0.0 - 0.2 %    Comment: Performed at [facility], [address]  [place], [place] [address]  CBG monitoring, ED     Status: Abnormal   Collection Time: 12/08/20  9:34 PM  Result Value Ref Range   Glucose-Capillary 102 (H) 70 - 99 mg/dL    Comment: Glucose reference range applies only to samples taken after fasting for at least 8 hours.  Resp Panel by RT-PCR (Flu A&B, Covid) Nasopharyngeal Swab     Status: None   Collection Time: 12/08/20 11:28 PM   Specimen: Nasopharyngeal Swab; Nasopharyngeal(NP) swabs in vial transport medium  Result Value Ref Range   SARS Coronavirus 2 by RT PCR NEGATIVE NEGATIVE    Comment: (NOTE) SARS-CoV-2 target nucleic acids are NOT DETECTED.  The SARS-CoV-2 RNA is generally detectable in upper respiratory specimens during the acute phase of infection. The lowest concentration of SARS-CoV-2 viral copies this assay can detect is 138 copies/mL. A negative result does not preclude SARS-Cov-2 infection and should not be used as the sole basis for treatment or other patient management decisions. A negative result may occur with  improper specimen collection/handling, submission of specimen other than nasopharyngeal swab, presence of viral mutation(s) within the areas targeted by this assay, and inadequate number of viral copies(<138 copies/mL). A negative result must be combined with clinical observations, patient history, and epidemiological information. The expected result is Negative.  Fact Sheet for Patients:  BloggerCourse.com  Fact Sheet for Healthcare Providers:  SeriousBroker.it  This test is no t yet approved or cleared by the [place] FDA and  has been authorized for detection and/or diagnosis of SARS-CoV-2 by FDA under an Emergency Use Authorization (EUA). This EUA will remain  in effect (meaning this test can be used) for the duration of the COVID-19 declaration under Section 564(b)(1) of the Act, 21 U.S.C.section 360bbb-3(b)(1), unless the authorization is  terminated  or revoked sooner.       Influenza A by PCR NEGATIVE NEGATIVE   Influenza B by PCR NEGATIVE NEGATIVE    Comment: (NOTE) The Xpert Xpress SARS-CoV-2/FLU/RSV plus assay is intended as an aid in the diagnosis of influenza from Nasopharyngeal swab specimens and should not be used as a sole basis for treatment. Nasal washings and aspirates are unacceptable for Xpert Xpress SARS-CoV-2/FLU/RSV testing.  Fact Sheet for Patients: BloggerCourse.com  Fact Sheet for Healthcare Providers: SeriousBroker.it  This test is not yet approved or cleared by the [place] FDA and has been authorized for detection and/or diagnosis of SARS-CoV-2 by FDA under an Emergency Use Authorization (EUA). This  EUA will remain in effect (meaning this test can be used) for the duration of the COVID-19 declaration under Section 564(b)(1) of the Act, 21 U.S.C. section 360bbb-3(b)(1), unless the authorization is terminated or revoked.  Performed at [facility], [address] [place], [place] [id number]    CT Head Wo Contrast  Result Date: 12/08/2020 CLINICAL DATA:  Seizure, fall EXAM: CT HEAD WITHOUT CONTRAST TECHNIQUE: Contiguous axial images were obtained from the base of the skull through the vertex without intravenous contrast. COMPARISON:  05/30/2020 FINDINGS: Brain: No acute intracranial abnormality. Specifically, no hemorrhage, hydrocephalus, mass lesion, acute infarction, or significant intracranial injury. Vascular: No hyperdense vessel or unexpected calcification. Skull: No acute calvarial abnormality. Sinuses/Orbits: No acute findings Other: None IMPRESSION: Normal study. Electronically Signed   By: Charlett NoseKevin  Dover M.D.   On: 12/08/2020 22:34   CT Cervical Spine Wo Contrast  Result Date: 12/08/2020 CLINICAL DATA:  Seizure EXAM: CT CERVICAL SPINE WITHOUT CONTRAST TECHNIQUE: Multidetector CT imaging of the cervical spine was  performed without intravenous contrast. Multiplanar CT image reconstructions were also generated. COMPARISON:  None FINDINGS: Alignment: Normal Skull base and vertebrae: No acute fracture. No primary bone lesion or focal pathologic process. Soft tissues and spinal canal: No prevertebral fluid or swelling. No visible canal hematoma. Disc levels: Prior ACDF at C5-6. Remainder of the disc spaces maintained. Upper chest: Postoperative changes in the right apex with scarring. Other: None IMPRESSION: No acute bony abnormality. Electronically Signed   By: Charlett NoseKevin  Dover M.D.   On: 12/08/2020 22:36   DG Chest Portable 1 View  Result Date: 12/08/2020 CLINICAL DATA:  Cough and seizure activity EXAM: PORTABLE CHEST 1 VIEW COMPARISON:  12/05/2020 FINDINGS: Cardiac shadow is stable. Multiple endobronchial valves are seen on the right. Lungs are clear. No pneumothorax or focal infiltrate is seen. No acute bony abnormality is noted. IMPRESSION: Multiple endobronchial valves are noted on the right. No acute abnormality seen. Electronically Signed   By: Alcide CleverMark  Lukens M.D.   On: 12/08/2020 21:43    Pending Labs Unresulted Labs (From admission, onward)    Start     Ordered   12/09/20 0500  Comprehensive metabolic panel  Tomorrow morning,   R        12/09/20 0130   12/09/20 0500  Magnesium  Tomorrow morning,   R        12/09/20 0130   12/09/20 0500  CBC WITH DIFFERENTIAL  Tomorrow morning,   R        12/09/20 0130          Vitals/Pain Today's Vitals   12/09/20 0048 12/09/20 0115 12/09/20 0130 12/09/20 0140  BP:   122/78   Pulse:  98 85   Resp:  18 (!) 25   Temp:      TempSrc:      SpO2:  98% 98%   Weight:      Height:      PainSc: 5    7     Isolation Precautions No active isolations  Medications Medications  divalproex (DEPAKOTE ER) 24 hr tablet 250 mg (has no administration in time range)  LORazepam (ATIVAN) injection 2 mg (has no administration in time range)  levETIRAcetam (KEPPRA) tablet 500  mg (500 mg Oral Given 12/09/20 0142)  pantoprazole (PROTONIX) EC tablet 40 mg (has no administration in time range)  folic acid (FOLVITE) tablet 1 mg (has no administration in time range)  zonisamide (ZONEGRAN) capsule 300 mg (300 mg Oral Given 12/09/20 0142)  acetaminophen (TYLENOL) tablet 650 mg (has no administration in time range)    Or  acetaminophen (TYLENOL) suppository 650 mg (has no administration in time range)  oxyCODONE (Oxy IR/ROXICODONE) immediate release tablet 5 mg (5 mg Oral Given 12/09/20 0142)  ondansetron (ZOFRAN) tablet 4 mg (has no administration in time range)    Or  ondansetron (ZOFRAN) injection 4 mg (has no administration in time range)  heparin injection 5,000 Units (has no administration in time range)  albuterol (PROVENTIL) (2.5 MG/3ML) 0.083% nebulizer solution 2.5 mg (has no administration in time range)  levETIRAcetam (KEPPRA) IVPB 1000 mg/100 mL premix (0 mg Intravenous Stopped 12/08/20 2155)  LORazepam (ATIVAN) injection 2 mg (2 mg Intravenous Given 12/08/20 2242)  acetaminophen (TYLENOL) tablet 650 mg (650 mg Oral Given 12/08/20 2331)    Mobility walks
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
## 2020-12-09 NOTE — Progress Notes (Signed)
Patient arrived to the floor via care link and the Sheriff, CHG bath given, bed weight obtained. Pt oriented to room, staff and equipment. Cardiac monitoring initiated we'll continue to monitor.

## 2020-12-09 NOTE — ED Notes (Signed)
[facility] at bedside 

## 2020-12-09 NOTE — Plan of Care (Signed)
Neurology has called [facility] multiple times to get update on whether they can get vimpat approved. Awaiting return call from nursing direcfor. Continue current AEDs, will f/u tmrw.  Bing Neighbors, MD [facility] [phone]  If 7pm- 7am, please page neurology on call as listed in AMION.

## 2020-12-09 NOTE — H&P (Signed)
TRH H&P    Patient Demographics:    Ronald Butler, is a 45 y.o. male  MRN: [id number]  DOB - [birth year]  Admit Date - 12/08/2020  Referring MD/NP/PA: Imogene Burn  Outpatient Primary MD for the patient is Patient, No Pcp Per (Inactive)  Patient coming from: incarceration  Chief complaint- Seizure   HPI:    Ronald Butler  is a 45 y.o. male, with history of pancreatitis, pneumothorax, and seizures presents to the ED with a chief complaint of seizure.  Patient reports that he really does not know why he came in.  He reports that he was told that he had seizures.  ED provider reports that the facility he was at witnessed seizure-like activity, he had another seizure with EMS where he had urinary incontinence, no drowsy postictal state, and then he had a third seizure in the ED.  He was loaded with Keppra, given Ativan.  Patient reports that he is supposed to be on several seizure medications, and the only one that he has been getting recently as Depakote.  He reports that his last seizure before today was just 5 days ago.  Reports is not normal for him to have seizures that close together when he is adherent to his medication regimen.  Patient reports he is having pain in his right back where it looks like he had a chest tube.  He reports no drainage, no fevers.  Patient has no other complaints at this time.  Patient is a former smoker quitting a couple months ago, does not drink alcohol, and has had a history of using cocaine.  He reports last use was in March.  He is not vaccinated for COVID, but he did recently have COVID.  Patient reports he would like to be DNR.  In the ED Temp 99, heart rate 87-1 01, respiratory rate 16-26, blood pressure 108/81, satting at 98% on room air No leukocytosis, hemoglobin 11.3 Chemistry panel is grossly unremarkable CT C-spine showed no acute bony abnormality CT head shows normal study Chest  x-Osiris shows multiple endobronchial valves in no acute abnormality seen EKG shows a heart rate of 94, sinus rhythm, QTC 428 Patient's home dose of Depakote was restarted, patient was loaded with Keppra, he was given Ativan.    Review of systems:    In addition to the HPI above,  No Fever-chills, Admits to headache that he attributes to another patient being very vocal in the hallway, no changes with Vision or hearing, No problems swallowing food or Liquids, Back pain between ribs, no cough or Shortness of Breath, No Abdominal pain, No Nausea or Vomiting, bowel movements are regular, No Blood in stool or Urine, No dysuria, No new skin rashes or bruises, No new joints pains-aches,  No new weakness, tingling, numbness in any extremity, No recent weight gain or loss, No polyuria, polydypsia or polyphagia, Mental stressor-currently incarcerated  All other systems reviewed and are negative.    Past History of the following :    Past Medical History:  Diagnosis Date   Pancreatitis  Pneumothorax    Seizures (HCC)       Past Surgical History:  Procedure Laterality Date   APPENDECTOMY     CHOLECYSTECTOMY     INTERCOSTAL NERVE BLOCK Right 10/30/2020   Procedure: INTERCOSTAL NERVE BLOCK;  Surgeon: Corliss Skains, MD;  Location: [facility] OR;  Service: Thoracic;  Laterality: Right;   VIDEO BRONCHOSCOPY WITH INSERTION OF INTERBRONCHIAL VALVE (IBV) N/A 11/28/2020   Procedure: VIDEO BRONCHOSCOPY WITH INSERTION OF INTERBRONCHIAL VALVE (IBV).;  Surgeon: Corliss Skains, MD;  Location: [facility] OR;  Service: Thoracic;  Laterality: N/A;   XI ROBOTIC ASSISTED THORACOSCOPY PLEURECTOMY Right 10/30/2020   Procedure: PLEURECTOMY;  Surgeon: Corliss Skains, MD;  Location: [facility] OR;  Service: Thoracic;  Laterality: Right;      Social History:      Social History   Tobacco Use   Smoking status: Every Day    Packs/day: 2.00    Pack years: 0.00    Types: Cigarettes   Smokeless tobacco: Never   Substance Use Topics   Alcohol use: Not Currently    Comment: occassional       Family History :     Family History  Family history unknown: Yes      Home Medications:   Prior to Admission medications   Medication Sig Start Date End Date Taking? Authorizing Provider  acetaminophen (TYLENOL) 325 MG tablet Take 2 tablets (650 mg total) by mouth every 6 (six) hours as needed for mild pain (or Fever >/= 101). 04/27/20   Dorcas Carrow, MD  albuterol (VENTOLIN HFA) 108 (90 Base) MCG/ACT inhaler Inhale 2 puffs into the lungs every 6 (six) hours as needed for wheezing or shortness of breath. 12/03/20   Zigmund Daniel., MD  divalproex (DEPAKOTE ER) 250 MG 24 hr tablet Take 500 mg (2 tablets) in the morning, and 750 mg (3 tablets) in the evening Patient not taking: Reported on 10/29/2020 05/31/20   Dartha Lodge, PA-C  folic acid (FOLVITE) 1 MG tablet Take 1 tablet (1 mg total) by mouth daily. 12/03/20 12/03/21  Zigmund Daniel., MD  ibuprofen (ADVIL) 400 MG tablet Take 1 tablet (400 mg total) by mouth every 6 (six) hours as needed for mild pain or moderate pain. 12/03/20   Zigmund Daniel., MD  Lacosamide 100 MG TABS Take 1 tablet (100 mg total) by mouth 2 (two) times daily. Patient not taking: Reported on 10/29/2020 05/06/20   Benjiman Core, MD  pantoprazole (PROTONIX) 40 MG tablet Take 1 tablet (40 mg total) by mouth daily. 12/04/20 01/03/21  Zigmund Daniel., MD  zonisamide (ZONEGRAN) 100 MG capsule Take 3 capsules (300 mg total) by mouth at bedtime. Patient not taking: Reported on 10/29/2020 04/30/20   Garlon Hatchet, PA-C     Allergies:    No Known Allergies   Physical Exam:   Vitals  Blood pressure 108/80, pulse (!) 101, temperature 99 F (37.2 C), temperature source Oral, resp. rate 16, height  (1.803 m), weight 61.2 kg, SpO2 98 %.  1.  General: Patient lying supine in bed,  no acute distress, bilateral lower extremities in handcuffs   2.  Psychiatric: Alert and oriented x 3, mood and behavior normal for situation, pleasant and cooperative with exam   3. Neurologic: Speech and language are normal, face is symmetric, moves all 4 extremities voluntarily, reduced sensation in the right leg compared to the left with a known history of neuropathy, at baseline without acute deficits  on limited exam   4. HEENMT:  Head is atraumatic, normocephalic, pupils reactive to light, neck is supple, trachea is midline, mucous membranes are moist   5. Respiratory : Lungs are clear to auscultation bilaterally without wheezing, rhonchi, rales, no cyanosis, no increase in work of breathing or accessory muscle use   6. Cardiovascular : Heart rate normal, rhythm is regular, no murmurs, rubs or gallops, no peripheral edema, peripheral pulses palpated   7. Gastrointestinal:  Abdomen is soft, nondistended, nontender to palpation bowel sounds active, no masses or organomegaly palpated   8. Skin:  Skin is warm, dry and intact without rashes, acute lesions, or ulcers on limited exam   9.Musculoskeletal:  No acute deformities or trauma, no asymmetry in tone, no peripheral edema, peripheral pulses palpated, no tenderness to palpation in the extremities     Data Review:    CBC Recent Labs  Lab 12/02/20 1022 12/03/20 0035 12/08/20 2113  WBC 5.4 4.7 5.7  HGB 12.3* 11.9* 11.3*  HCT 36.1* 35.1* 33.0*  PLT 200 207 248  MCV 90.7 91.2 92.4  MCH 30.9 30.9 31.7  MCHC 34.1 33.9 34.2  RDW 13.8 14.0 14.1  LYMPHSABS 1.4 1.3  --   MONOABS 0.8 0.8  --   EOSABS 0.1 0.1  --   BASOSABS 0.0 0.1  --    ------------------------------------------------------------------------------------------------------------------  Results for orders placed or performed during the hospital encounter of 12/08/20 (from the past 48 hour(s))  Valproic Acid (depakote) Level (if patient is taking this medication)     Status: Abnormal   Collection Time: 12/08/20  9:13 PM   Result Value Ref Range   Valproic Acid Lvl 38 (L) 50.0 - 100.0 ug/mL    Comment: Performed at [facility], [address], [place], [place] [address]  Basic metabolic panel - if new onset seizures     Status: None   Collection Time: 12/08/20  9:13 PM  Result Value Ref Range   Sodium 142 135 - 145 mmol/L   Potassium 3.7 3.5 - 5.1 mmol/L   Chloride 111 98 - 111 mmol/L   CO2 24 22 - 32 mmol/L   Glucose, Bld 99 70 - 99 mg/dL    Comment: Glucose reference range applies only to samples taken after fasting for at least 8 hours.   BUN 20 6 - 20 mg/dL   Creatinine, Ser 9.14 0.61 - 1.24 mg/dL   Calcium 9.2 8.9 - 78.2 mg/dL   GFR, Estimated >95 >62 mL/min    Comment: (NOTE) Calculated using the CKD-EPI Creatinine Equation (2021)    Anion gap 7 5 - 15    Comment: Performed at [facility], [address] [place], [place] [address]  CBC - if new onset seizures     Status: Abnormal   Collection Time: 12/08/20  9:13 PM  Result Value Ref Range   WBC 5.7 4.0 - 10.5 K/uL   RBC 3.57 (L) 4.22 - 5.81 MIL/uL   Hemoglobin 11.3 (L) 13.0 - 17.0 g/dL   HCT 57.8 (L) 46.9 - 62.9 %   MCV 92.4 80.0 - 100.0 fL   MCH 31.7 26.0 - 34.0 pg   MCHC 34.2 30.0 - 36.0 g/dL   RDW 52.8 41.3 - 24.4 %   Platelets 248 150 - 400 K/uL   nRBC 0.0 0.0 - 0.2 %    Comment: Performed at [facility], [address], [place], [place] [address]  CBG monitoring, ED  Status: Abnormal   Collection Time: 12/08/20  9:34 PM  Result Value Ref Range   Glucose-Capillary 102 (H) 70 - 99 mg/dL    Comment: Glucose reference range applies only to samples taken after fasting for at least 8 hours.    Chemistries  Recent Labs  Lab 12/02/20 1022 12/03/20 0035 12/08/20 2113  NA 141 141 142  K 3.8 3.8 3.7  CL 111 112* 111  CO2 22 24 24   GLUCOSE 98 112* 99  BUN 16 19 20   CREATININE 0.83 1.07 0.79  CALCIUM 9.2 9.0 9.2  MG 1.9 2.0  --   AST 13* 10*  --   ALT 12 15  --    ALKPHOS 62 67  --   BILITOT 0.3 0.3  --    ------------------------------------------------------------------------------------------------------------------  ------------------------------------------------------------------------------------------------------------------ GFR: Estimated Creatinine Clearance: 103.1 mL/min (by C-G formula based on SCr of 0.79 mg/dL). Liver Function Tests: Recent Labs  Lab 12/02/20 1022 12/03/20 0035  AST 13* 10*  ALT 12 15  ALKPHOS 62 67  BILITOT 0.3 0.3  PROT 6.6 6.3*  ALBUMIN 3.6 3.4*   No results for input(s): LIPASE, AMYLASE in the last 168 hours. No results for input(s): AMMONIA in the last 168 hours. Coagulation Profile: No results for input(s): INR, PROTIME in the last 168 hours. Cardiac Enzymes: No results for input(s): CKTOTAL, CKMB, CKMBINDEX, TROPONINI in the last 168 hours. BNP (last 3 results) No results for input(s): PROBNP in the last 8760 hours. HbA1C: No results for input(s): HGBA1C in the last 72 hours. CBG: Recent Labs  Lab 12/08/20 2134  GLUCAP 102*   Lipid Profile: No results for input(s): CHOL, HDL, LDLCALC, TRIG, CHOLHDL, LDLDIRECT in the last 72 hours. Thyroid Function Tests: No results for input(s): TSH, T4TOTAL, FREET4, T3FREE, THYROIDAB in the last 72 hours. Anemia Panel: No results for input(s): VITAMINB12, FOLATE, FERRITIN, TIBC, IRON, RETICCTPCT in the last 72 hours.  --------------------------------------------------------------------------------------------------------------- Urine analysis:    Component Value Date/Time   COLORURINE YELLOW 05/31/2020 0051   APPEARANCEUR CLOUDY (A) 05/31/2020 0051   LABSPEC 1.021 05/31/2020 0051   PHURINE 6.0 05/31/2020 0051   GLUCOSEU NEGATIVE 05/31/2020 0051   HGBUR NEGATIVE 05/31/2020 0051   BILIRUBINUR NEGATIVE 05/31/2020 0051   KETONESUR NEGATIVE 05/31/2020 0051   PROTEINUR NEGATIVE 05/31/2020 0051   NITRITE NEGATIVE 05/31/2020 0051   LEUKOCYTESUR NEGATIVE  05/31/2020 0051      Imaging Results:    CT Head Wo Contrast  Result Date: 12/08/2020 CLINICAL DATA:  Seizure, fall EXAM: CT HEAD WITHOUT CONTRAST TECHNIQUE: Contiguous axial images were obtained from the base of the skull through the vertex without intravenous contrast. COMPARISON:  05/30/2020 FINDINGS: Brain: No acute intracranial abnormality. Specifically, no hemorrhage, hydrocephalus, mass lesion, acute infarction, or significant intracranial injury. Vascular: No hyperdense vessel or unexpected calcification. Skull: No acute calvarial abnormality. Sinuses/Orbits: No acute findings Other: None IMPRESSION: Normal study. Electronically Signed   By: 02/07/2021 M.D.   On: 12/08/2020 22:34   CT Cervical Spine Wo Contrast  Result Date: 12/08/2020 CLINICAL DATA:  Seizure EXAM: CT CERVICAL SPINE WITHOUT CONTRAST TECHNIQUE: Multidetector CT imaging of the cervical spine was performed without intravenous contrast. Multiplanar CT image reconstructions were also generated. COMPARISON:  None FINDINGS: Alignment: Normal Skull base and vertebrae: No acute fracture. No primary bone lesion or focal pathologic process. Soft tissues and spinal canal: No prevertebral fluid or swelling. No visible canal hematoma. Disc levels: Prior ACDF at C5-6. Remainder of the disc spaces maintained. Upper chest: Postoperative changes  in the right apex with scarring. Other: None IMPRESSION: No acute bony abnormality. Electronically Signed   By: Charlett NoseKevin  Dover M.D.   On: 12/08/2020 22:36   DG Chest Portable 1 View  Result Date: 12/08/2020 CLINICAL DATA:  Cough and seizure activity EXAM: PORTABLE CHEST 1 VIEW COMPARISON:  12/05/2020 FINDINGS: Cardiac shadow is stable. Multiple endobronchial valves are seen on the right. Lungs are clear. No pneumothorax or focal infiltrate is seen. No acute bony abnormality is noted. IMPRESSION: Multiple endobronchial valves are noted on the right. No acute abnormality seen. Electronically Signed   By:  Alcide CleverMark  Lukens M.D.   On: 12/08/2020 21:43     Assessment & Plan:    Active Problems:   Seizure (HCC)   Seizures 3 seizures in total Neurology consulted and advised admission overnight [place] for observation and possible EEG Patient loaded with Keppra Continue Keppra 500 mg twice daily Home Depakote restarted Follow Zonegran restarted Seizure precautions Neurochecks As needed Ativan Continue to monitor Back pain Tylenol for mild pain [place] for moderate pain Continue to monitor   DVT Prophylaxis-   Lovenox - SCDs   AM Labs Ordered, also please review Full Orders  Family Communication: No family at bedside Code Status:  Full  Admission status: Observation Time spent in minutes : 65   Mariamawit Depaoli B Zierle-Ghosh DO

## 2020-12-10 ENCOUNTER — Inpatient Hospital Stay (HOSPITAL_COMMUNITY)

## 2020-12-10 DIAGNOSIS — R569 Unspecified convulsions: Secondary | ICD-10-CM

## 2020-12-10 MED ORDER — POTASSIUM CHLORIDE CRYS ER 20 MEQ PO TBCR
20.0000 meq | EXTENDED_RELEASE_TABLET | Freq: Once | ORAL | Status: AC
  Administered 2020-12-10: 20 meq via ORAL
  Filled 2020-12-10: qty 1

## 2020-12-10 MED ORDER — LACOSAMIDE 50 MG PO TABS
100.0000 mg | ORAL_TABLET | Freq: Once | ORAL | Status: AC
  Administered 2020-12-10: 100 mg via ORAL
  Filled 2020-12-10: qty 2

## 2020-12-10 NOTE — Progress Notes (Signed)
Neurology Progress Note  Subjective: - Company secretary confirmed they have arranged to have vimpat available to give him after discharge - Pt has had 2 events of drawing up and unresponsiveness with generalized shaking without vital sign instability since admission  Exam: Vitals:   12/10/20 1756 12/10/20 1946  BP: 103/73 99/69  Pulse: 90 79  Resp: 17 16  Temp: 98.6 F (37 C) 99.1 F (37.3 C)  SpO2: 99% 96%   General: Laying comfortably in bed; in no acute distress. HENT: Normal oropharynx and mucosa. Normal external appearance of ears and nose. Neck: Supple, no pain or tenderness CV: No JVD. No peripheral edema. Pulmonary: Symmetric Chest rise. Normal respiratory effort. Abdomen: Soft to touch, non-tender. Ext: No cyanosis, edema, or deformity Skin: No rash. Normal palpation of skin.   Musculoskeletal: Normal digits and nails by inspection. No clubbing.   Neurologic Examination  Mental status/Cognition: Alert, oriented to self, place, month and year, good attention. Speech/language: Fluent, comprehension intact, object naming intact, repetition intact. Cranial nerves:   CN II Pupils equal and reactive to light, no VF deficits   CN III,IV,VI EOM intact, no gaze preference or deviation, no nystagmus   CN V normal sensation in V1, V2, and V3 segments bilaterally   CN VII no asymmetry, no nasolabial fold flattening   CN VIII normal hearing to speech   CN IX & X normal palatal elevation, no uvular deviation   CN XI 5/5 head turn and 5/5 shoulder shrug bilaterally   CN XII midline tongue protrusion    Motor:  Muscle bulk: normal, tone normal, pronator drift none tremor none Mvmt Root Nerve  Muscle Right Left Comments  SA C5/6 Ax Deltoid 5 5    EF C5/6 Mc Biceps 5 5    EE C6/7/8 Rad Triceps 5 5    WF C6/7 Med FCR        WE C7/8 PIN ECU        F Ab C8/T1 U ADM/FDI 5 5    HF L1/2/3 Fem Illopsoas 5 5    KE L2/3/4 Fem Quad 5 5    DF L4/5 D Peron Tib Ant 5 5    PF  S1/2 Tibial Grc/Sol 5 5      Reflexes:   Right Left Comments  Pectoralis         Biceps (C5/6) 2 2    Brachioradialis (C5/6) 2 2     Triceps (C6/7) 2 2     Patellar (L3/4) 3 3     Achilles (S1) 2 2     Hoffman         Plantar        Jaw jerk       Sensation:  Light touch Hyper sensitivity to touch in LUE, LLE and L face.   Pin prick     Temperature     Vibration    Proprioception      Coordination/Complex Motor: - Finger to Nose intact - Heel to shin intact - Rapid alternating movement are normal - Gait: unable to assess, he is in shackles.   Impression: Ronald Butler is a 45 y.o. 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 Home meds are depakote, zonisamide, lacosamide but in jail he only got VPA and zonisamide. We have been able to confirm with jail  nursing medical director that they will be able to give him vimpat going forward and today he was loaded on vimpat and restarted at his home maint dose.   He has had 2 spells since admission and if he is discharged to jail he will be sent back to the hospital almost immediately for same. He would benefit from elective EMU admission, but due to logistics will hook him up now to cEEG for spell characterization.   Recommendations  - LTM for spell characterization - Continue zonisamide 300mg  qhs, VPA 500 qAM + 750mg  qPM, vimpat 100mg  q 12 hrs. Keppra d/c'd when vimpat restarted. - seizure precautions - No driving for 6 mos  Will continue to follow.  , MD [facility] [phone]  If 7pm- 7am, please page neurology on call as listed in AMION. \

## 2020-12-10 NOTE — Progress Notes (Signed)
LTM EEG hooked up and running - no initial skin breakdown - push button tested - neuro notified. [facility] monitoring.  

## 2020-12-10 NOTE — Progress Notes (Signed)
[facility]  PROGRESS NOTE  Ronald Butler TWS:[id number] DOB: [birth year] DOA: 12/08/2020 PCP: Patient, No Pcp Per (Inactive)   Brief HPI:   45 year old male with history of pancreatitis, pneumothorax, seizures was brought to the ED after he had seizure at correctional facility.  At the facility he had witnessed seizure-like activity and another seizure with EMS when he had urinary incontinence, no drowsy or postictal state.  He had a third seizure in the ED.  He was loaded with Keppra, given Ativan.  Patient was supposed to be on several seizure medications however he has been only getting Depakote at the facility.  Neurology was consulted.  Patient started on Keppra 500 mg p.o. twice daily, Depakote 500 mg daily in the morning and 750 mg at bedtime, Zonegran 300 mg daily at bedtime.   He has been complaining of right-sided chest pain, he had endobronchial valves placed for right-sided pneumothorax.  Patient underwent robotic assisted VATS for lysis of adhesions, wedge resection of upper and lower lobes, apical pleurectomy, mechanical pleurodesis and intercostal nerve block on 10/30/2020.  Due to persistent air leak he had bronchoscopy with endobronchial valve placement and Betadine pleurodesis on 11/28/2020.    Subjective   Patient seen and examined, chest pain has improved after starting Dilaudid.  Started on Vimpat yesterday.   Assessment/Plan:    ?  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 -Discussed with neurology, patient will benefit from continuous EEG monitoring -As he is having these spells despite being on AEDs in the hospital -Continue Vimpat, Depakote, zonisamide   Right-sided  /back pain -Wound care consulted for chest wall wound -Patient had robot assisted thoracoscopy in May -Continue Dilaudid 1 mg IV every 4 hours as needed for pain -Started methocarbamol 500 mg 4 times daily  Hypokalemia -Replace potassium and check BMP in  am   Scheduled medications:    divalproex  500 mg Oral Daily   divalproex  750 mg Oral QHS   enoxaparin (LOVENOX) injection  40 mg Subcutaneous Q24H   folic acid  1 mg Oral Daily   lacosamide  100 mg Oral BID   lacosamide  100 mg Oral Once   methocarbamol  500 mg Oral QID   pantoprazole  40 mg Oral Daily   zonisamide  300 mg Oral QHS         Data Reviewed:   CBG:  Recent Labs  Lab 12/08/20 2134  GLUCAP 102*    SpO2: 99 %    Vitals:   12/10/20 0031 12/10/20 0417 12/10/20 0808 12/10/20 1230  BP: 93/64 102/65 91/68 97/68   Pulse: 75 70 75 85  Resp: 15 11 14 16   Temp: 98.3 F (36.8 C) 98.3 F (36.8 C) 97.9 F (36.6 C) 98.8 F (37.1 C)  TempSrc: Oral Oral Oral Oral  SpO2: 98% 98% 98% 99%  Weight:      Height:         Intake/Output Summary (Last 24 hours) at 12/10/2020 1409 Last data filed at 12/10/2020 1230 Gross per 24 hour  Intake 240 ml  Output 520 ml  Net -280 ml    06/10 1901 - 06/12 0700 In: -  Out: 300 [Urine:300]  Filed Weights   12/08/20 2246 12/09/20 0312  Weight: 61.2 kg 65.6 kg    CBC:  Recent Labs  Lab 12/08/20 2113 12/09/20 0507  WBC 5.7 6.2  HGB 11.3* 11.3*  HCT 33.0* 33.6*  PLT 248 238  MCV 92.4 92.6  MCH 31.7 31.1  MCHC 34.2 33.6  RDW 14.1 14.1  LYMPHSABS  --  2.1  MONOABS  --  0.6  EOSABS  --  0.1  BASOSABS  --  0.0    Complete metabolic panel:  Recent Labs  Lab 12/08/20 2113 12/09/20 0507  NA 142 141  K 3.7 3.3*  CL 111 110  CO2 24 22  GLUCOSE 99 142*  BUN 20 18  CREATININE 0.79 0.86  CALCIUM 9.2 9.1  AST  --  13*  ALT  --  11  ALKPHOS  --  54  BILITOT  --  0.4  ALBUMIN  --  3.5  MG  --  1.9    No results for input(s): LIPASE, AMYLASE in the last 168 hours.  Recent Labs  Lab 12/08/20 2328  SARSCOV2NAA NEGATIVE    ------------------------------------------------------------------------------------------------------------------ No results for input(s): CHOL, HDL, LDLCALC, TRIG, CHOLHDL,  LDLDIRECT in the last 72 hours.  Lab Results  Component Value Date   HGBA1C 5.5 04/27/2020   ------------------------------------------------------------------------------------------------------------------ No results for input(s): TSH, T4TOTAL, T3FREE, THYROIDAB in the last 72 hours.  Invalid input(s): FREET3 ------------------------------------------------------------------------------------------------------------------ No results for input(s): VITAMINB12, FOLATE, FERRITIN, TIBC, IRON, RETICCTPCT in the last 72 hours.  Coagulation profile No results for input(s): INR, PROTIME in the last 168 hours. No results for input(s): DDIMER in the last 72 hours.  Cardiac Enzymes No results for input(s): CKTOTAL, CKMB, CKMBINDEX, TROPONINI in the last 168 hours.  ------------------------------------------------------------------------------------------------------------------    Component Value Date/Time   BNP 4.4 11/12/2020 0336     Antibiotics: Anti-infectives (From admission, onward)    None        Radiology Reports  CT Head Wo Contrast  Result Date: 12/08/2020 CLINICAL DATA:  Seizure, fall EXAM: CT HEAD WITHOUT CONTRAST TECHNIQUE: Contiguous axial images were obtained from the base of the skull through the vertex without intravenous contrast. COMPARISON:  05/30/2020 FINDINGS: Brain: No acute intracranial abnormality. Specifically, no hemorrhage, hydrocephalus, mass lesion, acute infarction, or significant intracranial injury. Vascular: No hyperdense vessel or unexpected calcification. Skull: No acute calvarial abnormality. Sinuses/Orbits: No acute findings Other: None IMPRESSION: Normal study. Electronically Signed   By: Charlett Nose M.D.   On: 12/08/2020 22:34   CT Cervical Spine Wo Contrast  Result Date: 12/08/2020 CLINICAL DATA:  Seizure EXAM: CT CERVICAL SPINE WITHOUT CONTRAST TECHNIQUE: Multidetector CT imaging of the cervical spine was performed without intravenous  contrast. Multiplanar CT image reconstructions were also generated. COMPARISON:  None FINDINGS: Alignment: Normal Skull base and vertebrae: No acute fracture. No primary bone lesion or focal pathologic process. Soft tissues and spinal canal: No prevertebral fluid or swelling. No visible canal hematoma. Disc levels: Prior ACDF at C5-6. Remainder of the disc spaces maintained. Upper chest: Postoperative changes in the right apex with scarring. Other: None IMPRESSION: No acute bony abnormality. Electronically Signed   By: Charlett Nose M.D.   On: 12/08/2020 22:36   DG Chest Portable 1 View  Result Date: 12/08/2020 CLINICAL DATA:  Cough and seizure activity EXAM: PORTABLE CHEST 1 VIEW COMPARISON:  12/05/2020 FINDINGS: Cardiac shadow is stable. Multiple endobronchial valves are seen on the right. Lungs are clear. No pneumothorax or focal infiltrate is seen. No acute bony abnormality is noted. IMPRESSION: Multiple endobronchial valves are noted on the right. No acute abnormality seen. Electronically Signed   By: Alcide Clever M.D.   On: 12/08/2020 21:43      DVT prophylaxis: Lovenox  Code Status: DNR  Family Communication: No family at bedside   Consultants:  Procedures:     Objective    Physical Examination:  General-appears in no acute distress Heart-S1-S2, regular, no murmur auscultated Lungs-clear to auscultation bilaterally, no wheezing or crackles auscultated Abdomen-soft, nontender, no organomegaly Extremities-no edema in the lower extremities Neuro-alert, oriented x3, no focal deficit noted  Status is: Inpatient  Dispo: The patient is from: Correctional facility              Anticipated d/c is to: Correctional facility              Anticipated d/c date is: 12/13/2020              Patient currently not stable for discharge  Barrier to discharge-ongoing evaluation for epileptic versus nonepileptic spell  COVID-19 Labs  No results for input(s): DDIMER, FERRITIN, LDH, CRP in  the last 72 hours.  Lab Results  Component Value Date   SARSCOV2NAA NEGATIVE 12/08/2020   SARSCOV2NAA POSITIVE (A) 10/28/2020   SARSCOV2NAA NEGATIVE 04/26/2020   SARSCOV2NAA NEGATIVE 04/18/2020    Microbiology  Recent Results (from the past 240 hour(s))  Resp Panel by RT-PCR (Flu A&B, Covid) Nasopharyngeal Swab     Status: None   Collection Time: 12/08/20 11:28 PM   Specimen: Nasopharyngeal Swab; Nasopharyngeal(NP) swabs in vial transport medium  Result Value Ref Range Status   SARS Coronavirus 2 by RT PCR NEGATIVE NEGATIVE Final    Comment: (NOTE) SARS-CoV-2 target nucleic acids are NOT DETECTED.  The SARS-CoV-2 RNA is generally detectable in upper respiratory specimens during the acute phase of infection. The lowest concentration of SARS-CoV-2 viral copies this assay can detect is 138 copies/mL. A negative result does not preclude SARS-Cov-2 infection and should not be used as the sole basis for treatment or other patient management decisions. A negative result may occur with  improper specimen collection/handling, submission of specimen other than nasopharyngeal swab, presence of viral mutation(s) within the areas targeted by this assay, and inadequate number of viral copies(<138 copies/mL). A negative result must be combined with clinical observations, patient history, and epidemiological information. The expected result is Negative.  Fact Sheet for Patients:  BloggerCourse.comhttps://www.fda.gov/media/152166/download  Fact Sheet for Healthcare Providers:  SeriousBroker.ithttps://www.fda.gov/media/152162/download  This test is no t yet approved or cleared by the [place] FDA and  has been authorized for detection and/or diagnosis of SARS-CoV-2 by FDA under an Emergency Use Authorization (EUA). This EUA will remain  in effect (meaning this test can be used) for the duration of the COVID-19 declaration under Section 564(b)(1) of the Act, 21 U.S.C.section 360bbb-3(b)(1), unless the authorization is  terminated  or revoked sooner.       Influenza A by PCR NEGATIVE NEGATIVE Final   Influenza B by PCR NEGATIVE NEGATIVE Final    Comment: (NOTE) The Xpert Xpress SARS-CoV-2/FLU/RSV plus assay is intended as an aid in the diagnosis of influenza from Nasopharyngeal swab specimens and should not be used as a sole basis for treatment. Nasal washings and aspirates are unacceptable for Xpert Xpress SARS-CoV-2/FLU/RSV testing.  Fact Sheet for Patients: BloggerCourse.comhttps://www.fda.gov/media/152166/download  Fact Sheet for Healthcare Providers: SeriousBroker.ithttps://www.fda.gov/media/152162/download  This test is not yet approved or cleared by the [place] FDA and has been authorized for detection and/or diagnosis of SARS-CoV-2 by FDA under an Emergency Use Authorization (EUA). This EUA will remain in effect (meaning this test can be used) for the duration of the COVID-19 declaration under Section 564(b)(1) of the Act, 21 U.S.C. section 360bbb-3(b)(1), unless the authorization is terminated or revoked.  Performed at [facility], [address] [place], [place] [address]              Meredeth Ide   [facility] If 7PM-7AM, please contact night-coverage at [email or web address], Office  [phone]   12/10/2020, 2:09 PM  LOS: 1 day

## 2020-12-11 LAB — BASIC METABOLIC PANEL
Anion gap: 8 (ref 5–15)
BUN: 21 mg/dL — ABNORMAL HIGH (ref 6–20)
CO2: 24 mmol/L (ref 22–32)
Calcium: 9.1 mg/dL (ref 8.9–10.3)
Chloride: 107 mmol/L (ref 98–111)
Creatinine, Ser: 0.99 mg/dL (ref 0.61–1.24)
GFR, Estimated: >60 mL/min (ref >60)
Glucose, Bld: 108 mg/dL — ABNORMAL HIGH (ref 70–99)
Potassium: 3.8 mmol/L (ref 3.5–5.1)
Sodium: 139 mmol/L (ref 135–145)

## 2020-12-11 LAB — CK: Total CK: 40 U/L — ABNORMAL LOW (ref 49–397)

## 2020-12-11 MED ORDER — LACOSAMIDE 100 MG PO TABS: 100.0000 mg | ORAL_TABLET | Freq: Two times a day (BID) | ORAL | 1 refills | Status: DC

## 2020-12-11 MED ORDER — METHOCARBAMOL 500 MG PO TABS: 500.0000 mg | ORAL_TABLET | Freq: Four times a day (QID) | ORAL | 1 refills | Status: DC | PRN

## 2020-12-11 MED ORDER — ACETAMINOPHEN 325 MG PO TABS: 650.0000 mg | ORAL_TABLET | Freq: Four times a day (QID) | ORAL | Status: DC | PRN

## 2020-12-11 NOTE — Progress Notes (Signed)
LTM discontinued; no skin breakdown was seen. 

## 2020-12-11 NOTE — Discharge Summary (Signed)
Physician Discharge Summary  Ronald Butler XLK:[id number] DOB: [birth year] DOA: 12/08/2020  PCP: Patient, No Pcp Per (Inactive)  Admit date: 12/08/2020 Discharge date: 12/11/2020  Time spent: 60 minutes  Recommendations for Outpatient Follow-up:  [facility]   Discharge Diagnoses:  Active Problems:   Seizure (HCC)   Seizures (HCC)   Discharge Condition: Stable  Diet recommendation: Regular diet  Filed Weights   12/08/20 2246 12/09/20 0312  Weight: 61.2 kg 65.6 kg    History of present illness:   45 year old male with history of pancreatitis, pneumothorax, seizures was brought to the ED after he had seizure at [facility] facility.  At the facility he had witnessed seizure-like activity and another seizure with EMS when he had urinary incontinence, no drowsy or postictal state.  He had a third seizure in the ED.  He was loaded with Keppra, given Ativan.  Patient was supposed to be on several seizure medications however he has been only getting Depakote at the facility.  Neurology was consulted.  Patient started on Keppra 500 mg p.o. twice daily, Depakote 500 mg daily in the morning and 750 mg at bedtime, Zonegran 300 mg daily at bedtime.   He has been complaining of right-sided chest pain, he had endobronchial valves placed for right-sided pneumothorax.  Patient underwent robotic assisted VATS for lysis of adhesions, wedge resection of upper and lower lobes, apical pleurectomy, mechanical pleurodesis and intercostal nerve block on 10/30/2020.  Due to persistent air leak he had bronchoscopy with endobronchial valve placement and Betadine pleurodesis on 11/28/2020.   Hospital Course:     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 -Discussed with neurology, patient will benefit from continuous EEG monitoring -LTM EEG was started however neurology feels that this will not be helpful to rule out epilepsy even if he has  nonepileptic spells in the hospital.  Recommend to continue with AEDs. -Continue Vimpat, Depakote, zonisamide -Confirmed with presenting nurse, they do have supply of Vimpat.  14-day coupon  of Vimpat will be given at discharge.     Right-sided /back pain -Wound care consulted for chest wall wound -Patient had robot assisted thoracoscopy in May -Patient was given Dilaudid 1 mg IV every 4 hours as needed for pain; no opioids to be given at discharge. -Started methocarbamol 500 mg 4 times daily as needed   Hypokalemia -Replete      Procedures: EEG  Consultations: Neurology  Discharge Exam: Vitals:   12/11/20 0310 12/11/20 0753  BP: 109/73 100/68  Pulse: 85 85  Resp: 12 18  Temp: 98.7 F (37.1 C) 99.1 F (37.3 C)  SpO2: 96% 98%      Discharge Instructions   Discharge Instructions     Diet - low sodium heart healthy   Complete by: As directed    Discharge wound care:   Complete by: As directed    Cleanse open wound to right chest wall with NS and pat dry. Apply NS moist  2x2 gauze to wound bed and cover with silicone foam.  Change NS moist gauze to wound bed twice daily. Change foam every three days and PRN soilage.   Increase activity slowly   Complete by: As directed       Allergies as of 12/11/2020   No Known Allergies      Medication List     TAKE these medications    acetaminophen 325 MG tablet Commonly known as: TYLENOL Take 2 tablets (650 mg total) by mouth  every 6 (six) hours as needed for mild pain (or Fever >/= 101).   albuterol 108 (90 Base) MCG/ACT inhaler Commonly known as: VENTOLIN HFA Inhale 2 puffs into the lungs every 6 (six) hours as needed for wheezing or shortness of breath. What changed: when to take this   divalproex 250 MG DR tablet Commonly known as: DEPAKOTE Take 500-750 mg by mouth See admin instructions. Take 2 tablets (500 mg) by mouth every morning and 3 tablets (750 mg) at night   folic acid 1 MG tablet Commonly  known as: FOLVITE Take 1 tablet (1 mg total) by mouth daily.   Lacosamide 100 MG Tabs Take 1 tablet (100 mg total) by mouth 2 (two) times daily.   methocarbamol 500 MG tablet Commonly known as: ROBAXIN Take 1 tablet (500 mg total) by mouth every 6 (six) hours as needed for muscle spasms.   pantoprazole 20 MG tablet Commonly known as: PROTONIX Take 40 mg by mouth every morning.   zonisamide 100 MG capsule Commonly known as: ZONEGRAN Take 3 capsules (300 mg total) by mouth at bedtime.               Discharge Care Instructions  (From admission, onward)           Start     Ordered   12/11/20 0000  Discharge wound care:       Comments: Cleanse open wound to right chest wall with NS and pat dry. Apply NS moist  2x2 gauze to wound bed and cover with silicone foam.  Change NS moist gauze to wound bed twice daily. Change foam every three days and PRN soilage.   12/11/20 1232           No Known Allergies    The results of significant diagnostics from this hospitalization (including imaging, microbiology, ancillary and laboratory) are listed below for reference.    Significant Diagnostic Studies: CT Head Wo Contrast  Result Date: 12/08/2020 CLINICAL DATA:  Seizure, fall EXAM: CT HEAD WITHOUT CONTRAST TECHNIQUE: Contiguous axial images were obtained from the base of the skull through the vertex without intravenous contrast. COMPARISON:  05/30/2020 FINDINGS: Brain: No acute intracranial abnormality. Specifically, no hemorrhage, hydrocephalus, mass lesion, acute infarction, or significant intracranial injury. Vascular: No hyperdense vessel or unexpected calcification. Skull: No acute calvarial abnormality. Sinuses/Orbits: No acute findings Other: None IMPRESSION: Normal study. Electronically Signed   By: Charlett Nose M.D.   On: 12/08/2020 22:34   CT chest without contrast  Result Date: 11/16/2020 CLINICAL DATA:  Pneumothorax with persistent air leak. Evaluate for blebs.  EXAM: CT CHEST WITHOUT CONTRAST TECHNIQUE: Multidetector CT imaging of the chest was performed following the standard protocol without IV contrast. COMPARISON:  None. FINDINGS: Cardiovascular: Limited evaluation in the absence of intravenous contrast. No evidence of aneurysm. Heart is normal in size. No pericardial effusion. Mediastinum/Nodes: Unremarkable CT appearance of the thyroid gland. No suspicious mediastinal or hilar adenopathy. No soft tissue mediastinal mass. The thoracic esophagus is unremarkable. Lungs/Pleura: Surgical changes of prior blebectomy at the right lung apex and superior segment of the right lower lobe. There are a few small blebs along the anterior margin of the right lung apex. Centrilobular pulmonary emphysema is also present. Small right basilar pneumothorax. Incomplete re-expansion of the superior segment of the right lower lobe. Thoracostomy tube in place entering via the right 9-10 rib interspace. Upper Abdomen: No acute abnormality. Incompletely imaged pneumobilia presumably related to prior sphincterotomy. Musculoskeletal: No acute fracture or aggressive appearing lytic  or blastic osseous lesion. IMPRESSION: 1. Small residual right basilar pneumothorax also with incomplete re-expansion of the superior segment of the right lower lobe despite well-positioned thoracostomy tube. 2. Evidence of prior surgical resection of pulmonary blebs at the right lung apex and superior segment of the right lower lobe. 3. Three small blebs present along the anterior aspect of the right lung apex. 4. Mild centrilobular pulmonary emphysema. Emphysema (ICD10-J43.9). Electronically Signed   By: Malachy Moan M.D.   On: 11/16/2020 08:13   CT Cervical Spine Wo Contrast  Result Date: 12/08/2020 CLINICAL DATA:  Seizure EXAM: CT CERVICAL SPINE WITHOUT CONTRAST TECHNIQUE: Multidetector CT imaging of the cervical spine was performed without intravenous contrast. Multiplanar CT image reconstructions were  also generated. COMPARISON:  None FINDINGS: Alignment: Normal Skull base and vertebrae: No acute fracture. No primary bone lesion or focal pathologic process. Soft tissues and spinal canal: No prevertebral fluid or swelling. No visible canal hematoma. Disc levels: Prior ACDF at C5-6. Remainder of the disc spaces maintained. Upper chest: Postoperative changes in the right apex with scarring. Other: None IMPRESSION: No acute bony abnormality. Electronically Signed   By: Charlett Nose M.D.   On: 12/08/2020 22:36   DG Chest Portable 1 View  Result Date: 12/08/2020 CLINICAL DATA:  Cough and seizure activity EXAM: PORTABLE CHEST 1 VIEW COMPARISON:  12/05/2020 FINDINGS: Cardiac shadow is stable. Multiple endobronchial valves are seen on the right. Lungs are clear. No pneumothorax or focal infiltrate is seen. No acute bony abnormality is noted. IMPRESSION: Multiple endobronchial valves are noted on the right. No acute abnormality seen. Electronically Signed   By: Alcide Clever M.D.   On: 12/08/2020 21:43   DG Chest Port 1 View  Result Date: 12/05/2020 CLINICAL DATA:  Altered mental status. EXAM: PORTABLE CHEST 1 VIEW COMPARISON:  December 03, 2020 FINDINGS: There is no evidence of acute infiltrate, pleural effusion or pneumothorax. Surgical sutures are seen overlying the right apex. Mild right apical pleural thickening is also noted. Multiple endobronchial valves are seen along the suprahilar and infrahilar region on the right. The heart size and mediastinal contours are within normal limits. The visualized skeletal structures are unremarkable. IMPRESSION: No active cardiopulmonary disease. Electronically Signed   By: Aram Candela M.D.   On: 12/05/2020 21:07   DG CHEST PORT 1 VIEW  Result Date: 12/03/2020 CLINICAL DATA:  Shortness of breath RIGHT apical pneumothorax, follow-up EXAM: PORTABLE CHEST 1 VIEW COMPARISON:  Portable exam 1110 hours compared to 11/30/2020 FINDINGS: Endobronchial valves project over RIGHT  perihilar region. Normal heart size, mediastinal contours, and pulmonary vascularity. Lungs appear emphysematous but clear. No pulmonary infiltrate or pleural effusion identified. Persistent small RIGHT apex pneumothorax is likely present, suboptimally seen due to superimposed osseous structures, with no pulmonary markings seen cranial to the RIGHT apex staple line. Bones unremarkable. 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 Electronically Signed   By: Ulyses Southward M.D.   On: 12/03/2020 10:49   DG CHEST PORT 1 VIEW  Result Date: 11/30/2020 CLINICAL DATA:  Chest tube removal EXAM: PORTABLE CHEST 1 VIEW COMPARISON:  11/29/2020 FINDINGS: Right upper lobectomy. Multiple endobronchial valves on the right. Small right apical pneumothorax approximately 10 mm in thickness. No significant interval change. No pleural effusion. Lungs are well aerated and clear. IMPRESSION: Small right apical pneumothorax unchanged. Electronically Signed   By: Marlan Palau M.D.   On: 11/30/2020 10:16   DG CHEST PORT 1 VIEW  Result Date: 11/29/2020 CLINICAL DATA:  Shortness of  breath.  Right chest tube removal. EXAM: PORTABLE CHEST 1 VIEW COMPARISON:  Earlier same day FINDINGS: Right chest tube is been removed. Pleural chest tube track remains visible as is often seen. Tiny amount of pleural air at the right apex is not increased. The right lung remains well aerated. Multiple airway valves redemonstrated. Left chest remains clear. IMPRESSION: No change other than removal of the right chest tube. Small amount of pleural air persists at the right apex. Electronically Signed   By: Paulina Fusi M.D.   On: 11/29/2020 11:06   DG Chest Port 1 View  Result Date: 11/29/2020 CLINICAL DATA:  Chest tube.  Bronchial valve placement. EXAM: PORTABLE CHEST 1 VIEW COMPARISON:  11/27/2020. FINDINGS: Right chest tube in stable position. Stable small right apical pneumothorax again noted. Bronchial valves noted on the  right. No focal infiltrate. Mild bibasilar subsegmental atelectasis. Heart size normal. Prior cervical spine fusion. Surgical clips right upper quadrant. IMPRESSION: 1. Right chest tube in stable position. Small right apical pneumothorax again noted. 2. Bronchial valves noted the right. Mild bibasilar subsegmental atelectasis. Electronically Signed   By: Maisie Fus  Register   On: 11/29/2020 07:05   DG Chest Port 1 View  Result Date: 11/28/2020 CLINICAL DATA:  Right pneumothorax.  Chest tube. EXAM: PORTABLE CHEST 1 VIEW COMPARISON:  11/27/2020. FINDINGS: Right chest tube in stable position. Right base pneumothorax has improved with minimal residual. Stable tiny right apical pneumothorax. Lungs are clear. Heart size normal. No displaced rib fracture noted. IMPRESSION: Right chest tube in stable position. Right pneumothorax has improved with minimal residual. Stable tiny right apical pneumothorax. Electronically Signed   By: Maisie Fus  Register   On: 11/28/2020 05:51   DG CHEST PORT 1 VIEW  Result Date: 11/27/2020 CLINICAL DATA:  45 year old male with history of pneumothorax. EXAM: PORTABLE CHEST 1 VIEW COMPARISON:  Chest x-Primus 11/26/2020. FINDINGS: Right-sided chest tube remains in position with tip in the apex of the right hemithorax. There continues to be a right sided pneumothorax which is very small in the apex of the right hemithorax, but there is a much larger basilar component on today's examination. No left pneumothorax. Lungs are clear bilaterally. No pleural effusions. No evidence of pulmonary edema. Heart size is normal. Mediastinal contours are within normal limits. IMPRESSION: 1. Enlarging right-sided pneumothorax, predominantly in the base of the right hemithorax, as above. Right chest tube is stable in position. Electronically Signed   By: Trudie Reed M.D.   On: 11/27/2020 08:14   DG Chest Port 1 View  Result Date: 11/26/2020 CLINICAL DATA:  Evaluate right-sided pneumothorax EXAM: PORTABLE  CHEST 1 VIEW COMPARISON:  11/25/2020; 11/22/2020; 11/20/2020; 11/17/2020 FINDINGS: Grossly unchanged cardiac silhouette and mediastinal contours. Stable postoperative change of the right hilum and right lung apex. Stable positioning of right-sided chest tube without definitive evidence of pneumothorax. The left hemithorax remains well aerated. No new focal airspace opacities. No pleural effusion. No evidence of edema. No acute osseous abnormalities. IMPRESSION: 1. Stable positioning of right-sided chest tube without definitive evidence pneumothorax. 2. Stable postoperative change of the right lung apex and hilum. Electronically Signed   By: Simonne Come M.D.   On: 11/26/2020 08:16   DG CHEST PORT 1 VIEW  Result Date: 11/25/2020 CLINICAL DATA:  Pneumothorax EXAM: PORTABLE CHEST 1 VIEW COMPARISON:  11/22/2020 FINDINGS: Tiny right apical pneumothorax, grossly unchanged, with indwelling right chest tube. Left lung is clear. No pleural effusion. The heart is normal in size. IMPRESSION: Tiny right apical pneumothorax, grossly unchanged, with  indwelling right chest tube. Electronically Signed   By: Charline BillsSriyesh  Krishnan M.D.   On: 11/25/2020 08:08   DG CHEST PORT 1 VIEW  Result Date: 11/22/2020 CLINICAL DATA:  Pneumothorax.  Chest tube. EXAM: PORTABLE CHEST 1 VIEW COMPARISON:  CT chest 11/16/2020, chest x-Nur 11/21/2020 FINDINGS: Right chest tube in similar position. The heart size and mediastinal contours are within normal limits. No focal consolidation. No pulmonary edema. No pleural effusion. Interval decrease in now trace right pneumothorax. Trace right apical pleural fluid again noted. No left pneumothorax. No acute osseous abnormality. IMPRESSION: Interval increase in now trace right pneumothorax with a right chest tube in similar position. Electronically Signed   By: Tish FredericksonMorgane  Naveau M.D.   On: 11/22/2020 06:46   DG CHEST PORT 1 VIEW  Result Date: 11/21/2020 CLINICAL DATA:  Pneumothorax.  Chest tube. EXAM:  PORTABLE CHEST 1 VIEW COMPARISON:  11/20/2020. FINDINGS: Right chest tube in stable position. Stable tiny right-sided pneumothorax again noted. Left costophrenic angle incompletely imaged. Lungs are clear. No pleural effusion identified. Heart size normal. No acute bony abnormality. Prior cervical spine fusion. IMPRESSION: Right chest tube in stable position. Stable tiny right-sided pneumothorax again noted. Chest is unchanged from prior exam. Electronically Signed   By: Maisie Fushomas  Register   On: 11/21/2020 07:05   DG CHEST PORT 1 VIEW  Result Date: 11/20/2020 CLINICAL DATA:  Pneumothorax and chest tube. EXAM: PORTABLE CHEST 1 VIEW COMPARISON:  11/18/2020. FINDINGS: Stable position of the right chest tube. Again noted is a very small lateral right pneumothorax. Left lung remains clear. Negative for left pneumothorax. Heart and mediastinum are stable. Trachea is midline. Partially visualized surgical hardware in the lower cervical spine. IMPRESSION: Stable small right pneumothorax. Stable position of the right chest tube. No focal lung disease. Electronically Signed   By: Richarda OverlieAdam  Henn M.D.   On: 11/20/2020 08:39   DG CHEST PORT 1 VIEW  Result Date: 11/18/2020 CLINICAL DATA:  Evaluate pneumothorax EXAM: PORTABLE CHEST 1 VIEW COMPARISON:  Nov 17, 2020 FINDINGS: The basilar right pneumothorax is improved. There is now a small right lateral component measuring 6 mm, not seen on the previous study. The right chest tube remains in place. No left-sided pneumothorax. The cardiomediastinal silhouette is stable. No other acute abnormalities. IMPRESSION: The right chest tube remains in good position. Overall, the right-sided pneumothorax is smaller in the interval. Only a small lateral component measuring 6 mm is identified. Electronically Signed   By: Gerome Samavid  Williams III M.D   On: 11/18/2020 09:17   DG CHEST PORT 1 VIEW  Result Date: 11/17/2020 CLINICAL DATA:  Pneumothorax EXAM: PORTABLE CHEST 1 VIEW COMPARISON:  Nov 17, 2020 study obtained earlier in the day FINDINGS: Chest tube position unchanged. There has been interval enlargement of a right base right base pneumothorax without tension component. There is atelectatic change in the right base. Lungs elsewhere clear. Heart size and pulmonary vascularity are normal. No adenopathy. No bone lesions. Right base pneumothorax now present without tension component. IMPRESSION: Interval enlargement of right base pneumothorax without tension component. Chest tube position unchanged. Right base atelectasis. Lungs elsewhere clear. Stable cardiac silhouette. Critical Value/emergent results were called by telephone at the time of interpretation on 11/17/2020 at 4:19 pm to provider Newton PiggKathy Boyd, RN, who verbally acknowledged these results. She informed me that the provider was aware of the findings on this radiograph and had made appropriate adjustments to the chest tube. Electronically Signed   By: Bretta BangWilliam  Woodruff III M.D.   On:  11/17/2020 16:19   DG CHEST PORT 1 VIEW  Result Date: 11/17/2020 CLINICAL DATA:  Chest pain.  Chest tube in place EXAM: PORTABLE CHEST 1 VIEW COMPARISON:  Chest radiograph and chest CT Nov 16, 2020 FINDINGS: Chest tube remains on the right. Right basilar pneumothorax again noted, better seen by CT. The focal area of presumed scarring in the right apex is less well seen by radiography than CT. No edema or airspace opacity. Heart size and pulmonary vascularity normal. No adenopathy. Postoperative change lower cervical spine. IMPRESSION: Chest tube unchanged in position on the right with persistent right basilar pneumothorax. No tension component. Scarring right apex better seen on CT. No edema or airspace opacity. Heart size normal Electronically Signed   By: Bretta Bang III M.D.   On: 11/17/2020 08:20   DG CHEST PORT 1 VIEW  Result Date: 11/16/2020 CLINICAL DATA:  Right-sided chest tube. EXAM: PORTABLE CHEST 1 VIEW COMPARISON:  11/15/2020. FINDINGS:  Right chest tube in stable position. Persistent right base pneumothorax. Postsurgical changes right lung. No focal infiltrate. No pleural effusion. No acute bony abnormality. Prior cervical spine fusion. Surgical clips upper abdomen. IMPRESSION: Right chest tube in stable position. Persistent right base pneumothorax. Electronically Signed   By: Maisie Fus  Register   On: 11/16/2020 05:55   DG CHEST PORT 1 VIEW  Result Date: 11/15/2020 CLINICAL DATA:  Chest tube on the right EXAM: PORTABLE CHEST 1 VIEW COMPARISON:  Nov 14, 2020 FINDINGS: Chest tube position on the right stable. Minimal right apical pneumothorax. Postoperative change noted on the right. No edema or airspace opacity. Heart size and pulmonary vascularity normal. No adenopathy. No bone lesions. IMPRESSION: Chest tube on the right with trace apical pneumothorax on the right. No edema or airspace opacity. Heart size normal. Electronically Signed   By: Bretta Bang III M.D.   On: 11/15/2020 08:28   DG CHEST PORT 1 VIEW  Result Date: 11/14/2020 CLINICAL DATA:  Shortness of breath.  Chest tube present. EXAM: PORTABLE CHEST 1 VIEW COMPARISON:  Nov 13, 2020 FINDINGS: Chest tube position unchanged on the right. No evident pneumothorax. No edema or airspace opacity. Heart size and pulmonary vascularity are normal. No adenopathy. No bone lesions. IMPRESSION: Chest tube position on the right unchanged. No pneumothorax evident. No edema or airspace opacity. Cardiac silhouette within normal limits. Electronically Signed   By: Bretta Bang III M.D.   On: 11/14/2020 07:51   DG Chest Port 1 View  Result Date: 11/13/2020 CLINICAL DATA:  Chest tube EXAM: PORTABLE CHEST 1 VIEW COMPARISON:  11/12/2020 FINDINGS: The heart size and mediastinal contours are within normal limits. Right-sided chest tube remains in position, tip about the right pulmonary apex. No appreciable residual pneumothorax. Both lungs are clear. The visualized skeletal structures are  unremarkable. IMPRESSION: Right-sided chest tube remains in position. No appreciable residual pneumothorax. No acute abnormality of the lungs. Electronically Signed   By: Lauralyn Primes M.D.   On: 11/13/2020 08:37   DG Chest Port 1 View  Result Date: 11/12/2020 CLINICAL DATA:  Follow-up RIGHT pneumothorax with chest tube in place. EXAM: PORTABLE CHEST 1 VIEW COMPARISON:  11/11/2020 and earlier, including CT chest 10/28/2020. FINDINGS: RIGHT chest tube in place with no evidence of a residual RIGHT pneumothorax. Bullous emphysematous changes in both lungs as noted previously. Lungs clear. Bronchovascular markings normal. No pleural effusions. Cardiomediastinal silhouette unremarkable and unchanged. IMPRESSION: 1. No evidence of residual RIGHT pneumothorax. 2. No acute cardiopulmonary disease. 3.  Emphysema. ([id number]) Electronically Signed  By: Hulan Saas M.D.   On: 11/12/2020 09:23   DG Chest Port 1V same Day  Result Date: 11/21/2020 CLINICAL DATA:  Right pneumothorax.  Chest tube clamped. EXAM: PORTABLE CHEST 1 VIEW COMPARISON:  Chest x-Shamus 11/21/2020. FINDINGS: Right chest tube in stable position. Progressive right base pneumothorax. Postsurgical changes right lung. Atelectatic changes right lung base. No pleural effusion. Heart size normal. Thoracic spine scoliosis. Postsurgical changes cervical spine. IMPRESSION: Right chest tube in stable position. Progressive prominent right base pneumothorax. Consider unclamping right chest tube. Critical Value/emergent results were called by telephone at the time of interpretation on 11/21/2020 at 1:51 pm to nurse Lurena Joiner, who verbally acknowledged these results. Electronically Signed   By: Maisie Fus  Register   On: 11/21/2020 13:53    Microbiology: Recent Results (from the past 240 hour(s))  Resp Panel by RT-PCR (Flu A&B, Covid) Nasopharyngeal Swab     Status: None   Collection Time: 12/08/20 11:28 PM   Specimen: Nasopharyngeal Swab; Nasopharyngeal(NP)  swabs in vial transport medium  Result Value Ref Range Status   SARS Coronavirus 2 by RT PCR NEGATIVE NEGATIVE Final    Comment: (NOTE) SARS-CoV-2 target nucleic acids are NOT DETECTED.  The SARS-CoV-2 RNA is generally detectable in upper respiratory specimens during the acute phase of infection. The lowest concentration of SARS-CoV-2 viral copies this assay can detect is 138 copies/mL. A negative result does not preclude SARS-Cov-2 infection and should not be used as the sole basis for treatment or other patient management decisions. A negative result may occur with  improper specimen collection/handling, submission of specimen other than nasopharyngeal swab, presence of viral mutation(s) within the areas targeted by this assay, and inadequate number of viral copies(<138 copies/mL). A negative result must be combined with clinical observations, patient history, and epidemiological information. The expected result is Negative.  Fact Sheet for Patients:  BloggerCourse.com  Fact Sheet for Healthcare Providers:  SeriousBroker.it  This test is no t yet approved or cleared by the [place] FDA and  has been authorized for detection and/or diagnosis of SARS-CoV-2 by FDA under an Emergency Use Authorization (EUA). This EUA will remain  in effect (meaning this test can be used) for the duration of the COVID-19 declaration under Section 564(b)(1) of the Act, 21 U.S.C.section 360bbb-3(b)(1), unless the authorization is terminated  or revoked sooner.       Influenza A by PCR NEGATIVE NEGATIVE Final   Influenza B by PCR NEGATIVE NEGATIVE Final    Comment: (NOTE) The Xpert Xpress SARS-CoV-2/FLU/RSV plus assay is intended as an aid in the diagnosis of influenza from Nasopharyngeal swab specimens and should not be used as a sole basis for treatment. Nasal washings and aspirates are unacceptable for Xpert Xpress  SARS-CoV-2/FLU/RSV testing.  Fact Sheet for Patients: BloggerCourse.com  Fact Sheet for Healthcare Providers: SeriousBroker.it  This test is not yet approved or cleared by the [place] FDA and has been authorized for detection and/or diagnosis of SARS-CoV-2 by FDA under an Emergency Use Authorization (EUA). This EUA will remain in effect (meaning this test can be used) for the duration of the COVID-19 declaration under Section 564(b)(1) of the Act, 21 U.S.C. section 360bbb-3(b)(1), unless the authorization is terminated or revoked.  Performed at [facility], [address] [place], [place] [address]      Labs: Basic Metabolic Panel: Recent Labs  Lab 12/08/20 2113 12/09/20 0507 12/11/20 0305  NA 142 141 139  K 3.7 3.3* 3.8  CL 111 110 107  CO2 GLUCOSE 99 142* 108*  BUN 20 18 21*  CREATININE 0.79 0.86 0.99  CALCIUM 9.2 9.1 9.1  MG  --  1.9  --    Liver Function Tests: Recent Labs  Lab 12/09/20 0507  AST 13*  ALT 11  ALKPHOS 54  BILITOT 0.4  PROT 6.3*  ALBUMIN 3.5   No results for input(s): LIPASE, AMYLASE in the last 168 hours. No results for input(s): AMMONIA in the last 168 hours. CBC: Recent Labs  Lab 12/08/20 2113 12/09/20 0507  WBC 5.7 6.2  NEUTROABS  --  3.5  HGB 11.3* 11.3*  HCT 33.0* 33.6*  MCV 92.4 92.6  PLT 248 238   Cardiac Enzymes: No results for input(s): CKTOTAL, CKMB, CKMBINDEX, TROPONINI in the last 168 hours. BNP: BNP (last 3 results) Recent Labs    11/10/20 0155 11/11/20 0048 11/12/20 0336  BNP 5.2 4.2 4.4    ProBNP (last 3 results) No results for input(s): PROBNP in the last 8760 hours.  CBG: Recent Labs  Lab 12/08/20 2134  GLUCAP 102*       Signed:  Meredeth Ide MD.  [facility] 12/11/2020, 12:32 PM

## 2020-12-11 NOTE — Progress Notes (Addendum)
[facility]  PROGRESS NOTE  Ronald Butler IFO:[id number] DOB: 08-02-1975 DOA: 12/08/2020 PCP: Patient, No Pcp Per (Inactive)   Brief HPI:   45 year old male with history of pancreatitis, pneumothorax, seizures was brought to the ED after he had seizure at correctional facility.  At the facility he had witnessed seizure-like activity and another seizure with EMS when he had urinary incontinence, no drowsy or postictal state.  He had a third seizure in the ED.  He was loaded with Keppra, given Ativan.  Patient was supposed to be on several seizure medications however he has been only getting Depakote at the facility.  Neurology was consulted.  Patient started on Keppra 500 mg p.o. twice daily, Depakote 500 mg daily in the morning and 750 mg at bedtime, Zonegran 300 mg daily at bedtime.   He has been complaining of right-sided chest pain, he had endobronchial valves placed for right-sided pneumothorax.  Patient underwent robotic assisted VATS for lysis of adhesions, wedge resection of upper and lower lobes, apical pleurectomy, mechanical pleurodesis and intercostal nerve block on 10/30/2020.  Due to persistent air leak he had bronchoscopy with endobronchial valve placement and Betadine pleurodesis on 11/28/2020.    Subjective   Patient seen and examined, LTM EEG monitoring set up by neurology.  Says that he does not feel well, complains of right-sided chest wall pain.  He has IV Dilaudid daily 4 hours as needed for pain.   Assessment/Plan:    ?  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 -Discussed with neurology, patient will benefit from continuous EEG monitoring -As he is having these spells despite being on AEDs in the hospital -Continue Vimpat, Depakote, zonisamide -Patient is currently on LTM EEG monitoring   Right-sided  /back pain -Wound care consulted for chest wall wound -Patient had robot assisted thoracoscopy in May -Continue Dilaudid 1 mg  IV every 4 hours as needed for pain -Continue methocarbamol 500 mg 4 times daily   Right chest wall wound -Wound care consulted   Hypokalemia -Replete   Scheduled medications:    divalproex  500 mg Oral Daily   divalproex  750 mg Oral QHS   enoxaparin (LOVENOX) injection  40 mg Subcutaneous Q24H   folic acid  1 mg Oral Daily   lacosamide  100 mg Oral BID   methocarbamol  500 mg Oral QID   pantoprazole  40 mg Oral Daily   zonisamide  300 mg Oral QHS         Data Reviewed:   CBG:  Recent Labs  Lab 12/08/20 2134  GLUCAP 102*    SpO2: 98 %    Vitals:   12/10/20 1946 12/10/20 2318 12/11/20 0310 12/11/20 0753  BP: 99/69 102/78 109/73 100/68  Pulse: 79 83 85 85  Resp: 16 14 12 18   Temp: 99.1 F (37.3 C) 98.7 F (37.1 C) 98.7 F (37.1 C) 99.1 F (37.3 C)  TempSrc: Oral Oral Oral Oral  SpO2: 96% 96% 96% 98%  Weight:      Height:         Intake/Output Summary (Last 24 hours) at 12/11/2020 1030 Last data filed at 12/11/2020 0700 Gross per 24 hour  Intake --  Output 1120 ml  Net -1120 ml    06/11 1901 - 06/13 0700 In: 240 [P.O.:240] Out: 1120 [Urine:1120]  Filed Weights   12/08/20 2246 12/09/20 0312  Weight: 61.2 kg 65.6 kg    CBC:  Recent Labs  Lab 12/08/20 2113 12/09/20 0507  WBC 5.7 6.2  HGB 11.3* 11.3*  HCT 33.0* 33.6*  PLT 248 238  MCV 92.4 92.6  MCH 31.7 31.1  MCHC 34.2 33.6  RDW 14.1 14.1  LYMPHSABS  --  2.1  MONOABS  --  0.6  EOSABS  --  0.1  BASOSABS  --  0.0    Complete metabolic panel:  Recent Labs  Lab 12/08/20 2113 12/09/20 0507 12/11/20 0305  NA 142 141 139  K 3.7 3.3* 3.8  CL 111 110 107  CO2 24 22 24   GLUCOSE 99 142* 108*  BUN 20 18 21*  CREATININE 0.79 0.86 0.99  CALCIUM 9.2 9.1 9.1  AST  --  13*  --   ALT  --  11  --   ALKPHOS  --  54  --   BILITOT  --  0.4  --   ALBUMIN  --  3.5  --   MG  --  1.9  --     No results for input(s): LIPASE, AMYLASE in the last 168 hours.  Recent Labs  Lab  12/08/20 2328  SARSCOV2NAA NEGATIVE    ------------------------------------------------------------------------------------------------------------------ No results for input(s): CHOL, HDL, LDLCALC, TRIG, CHOLHDL, LDLDIRECT in the last 72 hours.  Lab Results  Component Value Date   HGBA1C 5.5 04/27/2020   ------------------------------------------------------------------------------------------------------------------ No results for input(s): TSH, T4TOTAL, T3FREE, THYROIDAB in the last 72 hours.  Invalid input(s): FREET3 ------------------------------------------------------------------------------------------------------------------ No results for input(s): VITAMINB12, FOLATE, FERRITIN, TIBC, IRON, RETICCTPCT in the last 72 hours.  Coagulation profile No results for input(s): INR, PROTIME in the last 168 hours. No results for input(s): DDIMER in the last 72 hours.  Cardiac Enzymes No results for input(s): CKTOTAL, CKMB, CKMBINDEX, TROPONINI in the last 168 hours.  ------------------------------------------------------------------------------------------------------------------    Component Value Date/Time   BNP 4.4 11/12/2020 0336     Antibiotics: Anti-infectives (From admission, onward)    None        Radiology Reports  No results found.    DVT prophylaxis: Lovenox  Code Status: DNR  Family Communication: No family at bedside   Consultants:   Procedures:     Objective    Physical Examination:  General-appears in no acute distress Heart-S1-S2, regular, no murmur auscultated Lungs-clear to auscultation bilaterally, no wheezing or crackles auscultated Abdomen-soft, nontender, no organomegaly Extremities-no edema in the lower extremities Neuro-alert, oriented x3, no focal deficit noted  Status is: Inpatient  Dispo: The patient is from: Correctional facility              Anticipated d/c is to: Correctional facility              Anticipated  d/c date is: 12/13/2020              Patient currently not stable for discharge  Barrier to discharge-ongoing evaluation for epileptic versus nonepileptic spell  COVID-19 Labs  No results for input(s): DDIMER, FERRITIN, LDH, CRP in the last 72 hours.  Lab Results  Component Value Date   SARSCOV2NAA NEGATIVE 12/08/2020   SARSCOV2NAA POSITIVE (A) 10/28/2020   SARSCOV2NAA NEGATIVE 04/26/2020   SARSCOV2NAA NEGATIVE 04/18/2020    Microbiology  Recent Results (from the past 240 hour(s))  Resp Panel by RT-PCR (Flu A&B, Covid) Nasopharyngeal Swab     Status: None   Collection Time: 12/08/20 11:28 PM   Specimen: Nasopharyngeal Swab; Nasopharyngeal(NP) swabs in vial transport medium  Result Value Ref Range Status   SARS Coronavirus 2 by RT PCR NEGATIVE NEGATIVE Final    Comment: (NOTE)  SARS-CoV-2 target nucleic acids are NOT DETECTED.  The SARS-CoV-2 RNA is generally detectable in upper respiratory specimens during the acute phase of infection. The lowest concentration of SARS-CoV-2 viral copies this assay can detect is 138 copies/mL. A negative result does not preclude SARS-Cov-2 infection and should not be used as the sole basis for treatment or other patient management decisions. A negative result may occur with  improper specimen collection/handling, submission of specimen other than nasopharyngeal swab, presence of viral mutation(s) within the areas targeted by this assay, and inadequate number of viral copies(<138 copies/mL). A negative result must be combined with clinical observations, patient history, and epidemiological information. The expected result is Negative.  Fact Sheet for Patients:  BloggerCourse.com  Fact Sheet for Healthcare Providers:  SeriousBroker.it  This test is no t yet approved or cleared by the [place] FDA and  has been authorized for detection and/or diagnosis of SARS-CoV-2 by FDA under an  Emergency Use Authorization (EUA). This EUA will remain  in effect (meaning this test can be used) for the duration of the COVID-19 declaration under Section 564(b)(1) of the Act, 21 U.S.C.section 360bbb-3(b)(1), unless the authorization is terminated  or revoked sooner.       Influenza A by PCR NEGATIVE NEGATIVE Final   Influenza B by PCR NEGATIVE NEGATIVE Final    Comment: (NOTE) The Xpert Xpress SARS-CoV-2/FLU/RSV plus assay is intended as an aid in the diagnosis of influenza from Nasopharyngeal swab specimens and should not be used as a sole basis for treatment. Nasal washings and aspirates are unacceptable for Xpert Xpress SARS-CoV-2/FLU/RSV testing.  Fact Sheet for Patients: BloggerCourse.com  Fact Sheet for Healthcare Providers: SeriousBroker.it  This test is not yet approved or cleared by the [place] FDA and has been authorized for detection and/or diagnosis of SARS-CoV-2 by FDA under an Emergency Use Authorization (EUA). This EUA will remain in effect (meaning this test can be used) for the duration of the COVID-19 declaration under Section 564(b)(1) of the Act, 21 U.S.C. section 360bbb-3(b)(1), unless the authorization is terminated or revoked.  Performed at [facility], [address] [place], [place] [address]              Meredeth Ide   [facility] If 7PM-7AM, please contact night-coverage at [email or web address], Office  [phone]   12/11/2020, 10:30 AM  LOS: 2 days

## 2020-12-11 NOTE — Progress Notes (Signed)
Pt c/o SOB being worst. This morning

## 2020-12-11 NOTE — Consult Note (Signed)
WOC Nurse Consult Note: Reason for Consult:Nonhealing surgical wound to right flank/chest wall.  Thoracoscopy in May 2022.  Has not healed. Albumin 3.5.  Should debride by autolysis and heal with appropriate moist wound topical treatment.  Wound type: surgical/nonhealing Pressure Injury POA: NA Measurement: 2 cm x 2 cm  Wound bed:100% fibrin/slough to wound bed.   Drainage (amount, consistency, odor) minimal serosanguinous  Periwound: intact  other closed laparoscopic sites noted Dressing procedure/placement/frequency: Cleanse open wound to right chest wall with NS and pat dry. Apply NS moist gauze to wound bed and cover with silicone foam.  Change NS moist gauze to wound bed twice daily. Change foam every three days and PRN soilage.  Will not follow at this time.  Please re-consult if needed.  Ronald Hudson MSN, RN, FNP-BC CWON Wound, Ostomy, Continence Nurse Pager [phone]

## 2020-12-11 NOTE — Procedures (Signed)
Patient Name: Ronald Butler  MRN: [id number]  Epilepsy Attending: Charlsie Quest  Referring Physician/Provider: Dr Bing Neighbors Duration: 12/10/2020 1548 to 12/11/2020 1153  Patient history: 45 y.o. 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  EEG to evaluate for seizures.  Level of alertness: Awake, asleep  AEDs during EEG study: Depakote, zonisamide, Vimpat  Technical aspects: This EEG study was done with scalp electrodes positioned according to the 10-20 International system of electrode placement. Electrical activity was acquired at a sampling rate of 500Hz  and reviewed with a high frequency filter of 70Hz  and a low frequency filter of 1Hz . EEG data were recorded continuously and digitally stored.   Description: The posterior dominant rhythm consists of 9 Hz activity of moderate voltage (25-35 uV) seen predominantly in posterior head regions, symmetric and reactive to eye opening and eye closing. Sleep was characterized by vertex waves, sleep spindles (12 to 14 Hz), maximal frontocentral region. Hyperventilation and photic stimulation were not performed.     IMPRESSION: This study is within normal limits. No seizures or epileptiform discharges were seen throughout the recording.  Marilyn Nihiser 

## 2020-12-11 NOTE — Progress Notes (Signed)
Subjective: No further seizure-like episodes overnight.  States he feels tired and his muscles hurt.  States he has been on Keppra and Dilantin in the past but had side effects. Vimpat controls his seizures  ROS: negative except above  Examination  Vital signs in last 24 hours: Temp:  [98.6 F (37 C)-99.1 F (37.3 C)] 98.6 F (37 C) (06/13 1233) Pulse Rate:  [79-90] 80 (06/13 1233) Resp:  [12-18] 18 (06/13 1233) BP: (99-110)/(68-78) 110/71 (06/13 1233) SpO2:  [96 %-100 %] 100 % (06/13 1233)  General: lying in bed, NAD CVS: pulse-normal rate and rhythm RS: breathing comfortably, symmetric expansion bilaterally Extremities: normal, warm Neuro: AOx3, no evidence of aphasia, cranial nerves II to XII grossly intact, 5/5 in all 4 extremities  Basic Metabolic Panel: Recent Labs  Lab 12/08/20 2113 12/09/20 0507 12/11/20 0305  NA 142 141 139  K 3.7 3.3* 3.8  CL 111 110 107  CO2 24 22 24   GLUCOSE 99 142* 108*  BUN 20 18 21*  CREATININE 0.79 0.86 0.99  CALCIUM 9.2 9.1 9.1  MG  --  1.9  --     CBC: Recent Labs  Lab 12/08/20 2113 12/09/20 0507  WBC 5.7 6.2  NEUTROABS  --  3.5  HGB 11.3* 11.3*  HCT 33.0* 33.6*  MCV 92.4 92.6  PLT 248 238     Coagulation Studies: No results for input(s): LABPROT, INR in the last 72 hours.  Imaging CT head without contrast 12/08/2020: No acute abnormality  ASSESSMENT AND PLAN: 45 year old male with history of epilepsy versus nonepileptic spells who was brought in from correctional facility after multiple seizure-like episodes.  Seizure-like episodes -LTM EEG was within normal limits -Differentials include nonepileptic spells (pseudoseizures) versus breakthrough seizures in the setting of not receiving medications  Recommendations -Patient has had about 9 ED visits in the past 9 months.  During all these visits, patient was incarcerated and had seizure-like episodes.  At the time of first admission it appears that patient was only on  Depakote and gradually over time zonisamide was added followed by Vimpat.  Patient states Vimpat has helped him the most.   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  It is also possible that he is malingering because of being incarcerated.  However, unfortunately normal EEG does not rule out epilepsy. Additionally, both epilepsy and nonepileptic events can be precipitated by stress and patients can have both epileptic and nonepileptic events.  Lastly, patient was not getting his prescribed antiseizure medications at this time which could have precipitated the events -I discussed the diagnosis of nonepileptic events as well as epilepsy with patient.  I called and spoke with medical director who recommended discharging patient with vimpat tablets as they do not have it in the formulary.  I discussed the importance of medication compliance.   -Due to high suspicion for nonepileptic events and patient not getting the prescribed medications, I do not recommend changing patient's AEDs at this point -Recommended continue LTM EEG, continue home dose of AEDs -Seizure precaution including do not drive for 6 months was discussed with patient -I discussed the importance of having outpatient neurology follow-up and the importance of EMU evaluation for characterization of the spells -Of note, if patient has brief (less than 5 minutes) seizure-like episodes with return to baseline, patient does not need to be brought into the emergency room.  I have spent a total of 45   minutes with the patient reviewing hospital notes,  test results,  labs and examining the patient as well as establishing an assessment and plan that was discussed personally with the patient, Dr Sharl Ma and RN.  > 50% of time was spent in direct patient care.    Lindie Spruce Epilepsy [facility] For questions after 5pm please refer to AMION to reach the Neurologist on call

## 2020-12-13 ENCOUNTER — Inpatient Hospital Stay (HOSPITAL_COMMUNITY)
Admission: EM | Admit: 2020-12-13 | Discharge: 2020-12-15 | DRG: 101 | Disposition: A | Discharge status: Home / Self Care | Attending: Internal Medicine | Admitting: Internal Medicine

## 2020-12-13 ENCOUNTER — Encounter (HOSPITAL_COMMUNITY): Payer: Self-pay

## 2020-12-13 ENCOUNTER — Emergency Department (HOSPITAL_COMMUNITY)

## 2020-12-13 DIAGNOSIS — G40909 Epilepsy, unspecified, not intractable, without status epilepticus: Principal | ICD-10-CM | POA: Diagnosis present

## 2020-12-13 DIAGNOSIS — J9383 Other pneumothorax: Secondary | ICD-10-CM | POA: Diagnosis not present

## 2020-12-13 DIAGNOSIS — R569 Unspecified convulsions: Secondary | ICD-10-CM | POA: Diagnosis not present

## 2020-12-13 DIAGNOSIS — Z765 Malingerer [conscious simulation]: Secondary | ICD-10-CM

## 2020-12-13 DIAGNOSIS — F17210 Nicotine dependence, cigarettes, uncomplicated: Secondary | ICD-10-CM | POA: Diagnosis present

## 2020-12-13 DIAGNOSIS — F445 Conversion disorder with seizures or convulsions: Secondary | ICD-10-CM

## 2020-12-13 DIAGNOSIS — Z66 Do not resuscitate: Secondary | ICD-10-CM | POA: Diagnosis present

## 2020-12-13 DIAGNOSIS — Z79899 Other long term (current) drug therapy: Secondary | ICD-10-CM

## 2020-12-13 DIAGNOSIS — T402X1A Poisoning by other opioids, accidental (unintentional), initial encounter: Secondary | ICD-10-CM | POA: Diagnosis present

## 2020-12-13 LAB — COMPREHENSIVE METABOLIC PANEL
ALT: 12 U/L (ref 0–44)
AST: 10 U/L — ABNORMAL LOW (ref 15–41)
Albumin: 3.8 g/dL (ref 3.5–5.0)
Alkaline Phosphatase: 68 U/L (ref 38–126)
Anion gap: 6 (ref 5–15)
BUN: 23 mg/dL — ABNORMAL HIGH (ref 6–20)
CO2: 26 mmol/L (ref 22–32)
Calcium: 9.2 mg/dL (ref 8.9–10.3)
Chloride: 107 mmol/L (ref 98–111)
Creatinine, Ser: 0.92 mg/dL (ref 0.61–1.24)
GFR, Estimated: >60 mL/min (ref >60)
Glucose, Bld: 115 mg/dL — ABNORMAL HIGH (ref 70–99)
Potassium: 3.6 mmol/L (ref 3.5–5.1)
Sodium: 139 mmol/L (ref 135–145)
Total Bilirubin: 0.4 mg/dL (ref 0.3–1.2)
Total Protein: 6.7 g/dL (ref 6.5–8.1)

## 2020-12-13 LAB — CBC WITH DIFFERENTIAL/PLATELET
Abs Immature Granulocytes: 0.02 10*3/uL (ref 0.00–0.07)
Basophils Absolute: 0.1 10*3/uL (ref 0.0–0.1)
Basophils Relative: 1 %
Eosinophils Absolute: 0.1 10*3/uL (ref 0.0–0.5)
Eosinophils Relative: 1 %
HCT: 37.3 % — ABNORMAL LOW (ref 39.0–52.0)
Hemoglobin: 12.4 g/dL — ABNORMAL LOW (ref 13.0–17.0)
Immature Granulocytes: 0 %
Lymphocytes Relative: 28 %
Lymphs Abs: 1.9 10*3/uL (ref 0.7–4.0)
MCH: 31.3 pg (ref 26.0–34.0)
MCHC: 33.2 g/dL (ref 30.0–36.0)
MCV: 94.2 fL (ref 80.0–100.0)
Monocytes Absolute: 0.6 10*3/uL (ref 0.1–1.0)
Monocytes Relative: 10 %
Neutro Abs: 4 10*3/uL (ref 1.7–7.7)
Neutrophils Relative %: 60 %
Platelets: 265 10*3/uL (ref 150–400)
RBC: 3.96 MIL/uL — ABNORMAL LOW (ref 4.22–5.81)
RDW: 14.2 % (ref 11.5–15.5)
WBC: 6.7 10*3/uL (ref 4.0–10.5)
nRBC: 0 % (ref 0.0–0.2)

## 2020-12-13 LAB — ACETAMINOPHEN LEVEL: Acetaminophen (Tylenol), Serum: <10 ug/mL — ABNORMAL LOW (ref 10–30)

## 2020-12-13 LAB — SALICYLATE LEVEL: Salicylate Lvl: <7 mg/dL — ABNORMAL LOW (ref 7.0–30.0)

## 2020-12-13 LAB — VALPROIC ACID LEVEL: Valproic Acid Lvl: 42 ug/mL — ABNORMAL LOW (ref 50.0–100.0)

## 2020-12-13 LAB — ETHANOL: Alcohol, Ethyl (B): <10 mg/dL (ref <10)

## 2020-12-13 MED ORDER — LACTATED RINGERS IV SOLN: INTRAVENOUS | Status: DC

## 2020-12-13 MED ORDER — LACTATED RINGERS IV BOLUS
1000.0000 mL | Freq: Once | INTRAVENOUS | Status: AC
  Administered 2020-12-13: 1000 mL via INTRAVENOUS

## 2020-12-13 MED ORDER — VALPROATE SODIUM 100 MG/ML IV SOLN
500.0000 mg | Freq: Once | INTRAVENOUS | Status: AC
  Administered 2020-12-14: 500 mg via INTRAVENOUS
  Filled 2020-12-13: qty 5

## 2020-12-13 MED ORDER — LORAZEPAM 2 MG/ML IJ SOLN
INTRAMUSCULAR | Status: AC
  Administered 2020-12-13: 2 mg
  Filled 2020-12-13: qty 1

## 2020-12-13 NOTE — ED Notes (Signed)
Pt transported to CT, [employer] at bedside

## 2020-12-13 NOTE — ED Provider Notes (Addendum)
[facility] EMERGENCY DEPARTMENT Provider Note   CSN: [id number] Arrival date & time: 12/13/20  2036     History Chief Complaint  Patient presents with   Drug Overdose    Ronald Butler is a 45 y.o. male.  45 year old male with history of seizure disorder as well as substance abuse presents after having a witnessed seizure while in custody at the jail this evening.  Patient given Narcan prior to this for suspected drug overdose.  Patient had multiple seizures which required EMS to be called.  They gave the patient Versed 2.5 mg and transported him here.  Patient initially somnolent but is now more arousable.  States that he has been compliant with his Depakote while in custody.  Denies any ingestions this evening.  History is limited due to his postictal state.  Review of patient's old medical record shows that he was just discharged from the hospital 2 days ago.  He had been admitted for seizure like activity.  He had EEG which was normal however the neurologist felt that he could still have epilepsy.  Recommended no changes to his AEDs other than patient being on Vimpat.      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    Past Surgical History:  Procedure Laterality Date   APPENDECTOMY     CHOLECYSTECTOMY     INTERCOSTAL NERVE BLOCK Right 10/30/2020   Procedure: INTERCOSTAL NERVE BLOCK;  Surgeon: Corliss Skains, MD;  Location: [facility] OR;  Service: Thoracic;  Laterality: Right;   VIDEO BRONCHOSCOPY WITH INSERTION OF INTERBRONCHIAL VALVE (IBV) N/A 11/28/2020   Procedure: VIDEO BRONCHOSCOPY WITH INSERTION OF INTERBRONCHIAL VALVE (IBV).;  Surgeon: Corliss Skains, MD;   Location: [facility] OR;  Service: Thoracic;  Laterality: N/A;   XI ROBOTIC ASSISTED THORACOSCOPY PLEURECTOMY Right 10/30/2020   Procedure: PLEURECTOMY;  Surgeon: Corliss Skains, MD;  Location: [facility] OR;  Service: Thoracic;  Laterality: Right;       Family History  Family history unknown: Yes    Social History   Tobacco Use   Smoking status: Every Day    Packs/day: 2.00    Pack years: 0.00    Types: Cigarettes   Smokeless tobacco: Never  Vaping Use   Vaping Use: Never used  Substance Use Topics   Alcohol use: Not Currently    Comment: occassional   Drug use: Not Currently    Types: Marijuana    Home Medications Prior to Admission medications   Medication Sig Start Date End Date Taking? Authorizing Provider  acetaminophen (TYLENOL) 325 MG tablet Take 2 tablets (650 mg total) by mouth every 6 (six) hours as needed for mild pain (or Fever >/= 101). 12/11/20   Meredeth Ide, MD  albuterol (VENTOLIN HFA) 108 (90 Base) MCG/ACT inhaler Inhale 2 puffs into the lungs every 6 (six) hours as needed for wheezing or shortness of breath. 12/03/20   Zigmund Daniel., MD  divalproex (DEPAKOTE) 250 MG DR tablet Take 500-750 mg by mouth See admin instructions. Take 2 tablets (500 mg) by mouth every morning and 3 tablets (750 mg) at night    [provider]  folic acid (FOLVITE) 1 MG tablet Take  1 tablet (1 mg total) by mouth daily. 12/03/20 12/03/21  Zigmund Daniel., MD  Lacosamide 100 MG TABS Take 1 tablet (100 mg total) by mouth 2 (two) times daily. 12/11/20   Meredeth Ide, MD  methocarbamol (ROBAXIN) 500 MG tablet Take 1 tablet (500 mg total) by mouth every 6 (six) hours as needed for muscle spasms. 12/11/20   Meredeth Ide, MD  pantoprazole (PROTONIX) 20 MG tablet Take 40 mg by mouth every morning.    [provider]  zonisamide (ZONEGRAN) 100 MG capsule Take 3 capsules (300 mg total) by mouth at bedtime. 04/30/20   Garlon Hatchet, PA-C    Allergies    Patient has no  known allergies.  Review of Systems   Review of Systems  Unable to perform ROS: Acuity of condition   Physical Exam Updated Vital Signs BP 111/82 (BP Location: Left Arm)   Pulse (!) 101   Temp 99.3 F (37.4 C) (Oral)   Resp (!) 21   SpO2 98%   Physical Exam Vitals and nursing note reviewed.  Constitutional:      General: He is not in acute distress.    Appearance: Normal appearance. He is well-developed. He is not toxic-appearing.  HENT:     Head: Normocephalic and atraumatic.  Eyes:     General: Lids are normal.     Conjunctiva/sclera: Conjunctivae normal.     Pupils: Pupils are equal, round, and reactive to light.  Neck:     Thyroid: No thyroid mass.     Trachea: No tracheal deviation.  Cardiovascular:     Rate and Rhythm: Normal rate and regular rhythm.     Heart sounds: Normal heart sounds. No murmur heard.   No gallop.  Pulmonary:     Effort: Pulmonary effort is normal. No respiratory distress.     Breath sounds: Normal breath sounds. No stridor. No decreased breath sounds, wheezing, rhonchi or rales.  Abdominal:     General: There is no distension.     Palpations: Abdomen is soft.     Tenderness: There is no abdominal tenderness. There is no rebound.  Musculoskeletal:        General: No tenderness. Normal range of motion.     Cervical back: Normal range of motion and neck supple.  Skin:    General: Skin is warm and dry.     Findings: No abrasion or rash.  Neurological:     Mental Status: He is oriented to person, place, and time. He is lethargic and confused.     GCS: GCS eye subscore is 4. GCS verbal subscore is 5. GCS motor subscore is 6.     Cranial Nerves: Cranial nerves are intact. No cranial nerve deficit.     Sensory: No sensory deficit.     Motor: Motor function is intact.  Psychiatric:        Attention and Perception: He is inattentive.        Mood and Affect: Affect is flat.        Speech: Speech is delayed.        Behavior: Behavior is slowed.     ED Results / Procedures / Treatments   Labs (all labs ordered are listed, but only abnormal results are displayed) Labs Reviewed  ETHANOL  RAPID URINE DRUG SCREEN, HOSP PERFORMED  SALICYLATE LEVEL  ACETAMINOPHEN LEVEL  CBC WITH DIFFERENTIAL/PLATELET  COMPREHENSIVE METABOLIC PANEL  VALPROIC ACID LEVEL    EKG EKG Interpretation  Date/Time:  Wednesday December 13 2020 20:40:19 EDT Ventricular Rate:  99 PR Interval:  135 QRS Duration: 89 QT Interval:  356 QTC Calculation: 457 R Axis:   93 Text Interpretation: Sinus rhythm Borderline right axis deviation Borderline T wave abnormalities Confirmed by Lorre Nick ([id number]) on 12/13/2020 8:53:48 PM  Radiology No results found.  Procedures Procedures   Medications Ordered in ED Medications  lactated ringers bolus 1,000 mL (has no administration in time range)  lactated ringers infusion (has no administration in time range)    ED Course  I have reviewed the triage vital signs and the nursing notes.  Pertinent labs & imaging results that were available during my care of the patient were reviewed by me and considered in my medical decision making (see chart for details).    MDM Rules/Calculators/A&P                          CT of head and cervical spine negative.  Labs are reassuring.  Depakote level is low at 45.  Case discussed with neurologist on-call who recommends patient be admitted for observation.  He will load with Depakote.  Will consult hospitalist for admission  10:37 PM Patient had generalized seizure activity here for about a minute.  Given Ativan.  CRITICAL CARE Performed by: Toy [name] Total critical care time: 55 minutes Critical care time was exclusive of separately billable procedures and treating other patients. Critical care was necessary to treat or prevent imminent or life-threatening deterioration. Critical care was time spent personally by me on the following activities: development of  treatment plan with patient and/or surrogate as well as nursing, discussions with consultants, evaluation of patient's response to treatment, examination of patient, obtaining history from patient or surrogate, ordering and performing treatments and interventions, ordering and review of laboratory studies, ordering and review of radiographic studies, pulse oximetry and re-evaluation of patient's condition.  Final Clinical Impression(s) / ED Diagnoses Final diagnoses:  None    Rx / DC Orders ED Discharge Orders     None        Lorre Nick, MD 12/13/20 2154    Lorre Nick, MD 12/13/20 2237

## 2020-12-13 NOTE — ED Notes (Signed)
[employer] officer at bedside 

## 2020-12-13 NOTE — H&P (Signed)
History and Physical        Hospital Admission Note Date: 12/14/2020  Patient name: Ronald Butler Medical record number: [id number] Date of birth: 06/08/1976 Age: 45 y.o. Gender: male  PCP: Patient, No Pcp Per (Inactive)    Patient coming from: [facility]   I have reviewed all records in the [facility].    Chief Complaint:  Seizure   HPI: Ronald Butler is a 45 y.o. male with PMH of seizure disorder, substance abuse, and recurrent pneumothorax who presents for witnessed seizure while in custody of [place] jail. Patient given Narcan prior to this for suspected drug overdose.  Patient had multiple seizures which required EMS to be called.  They gave the patient Versed 2.5 mg and transported him here. He was apparently initially somnolent but is not awake and alert. He was discharged from hospital 2 days prior. At that time was also admitted for seizure like activity. He had EEG which was normal however the neurologist felt that he could still have epilepsy.  Recommended no changes to his AEDs other than patient being on Vimpat. Patient reports being compliant with medications. Denies ingestion of any illicit substances.    ED work-up/course:  CT of head and cervical spine negative.  Labs are reassuring.  Depakote level is low at 45.  Case discussed with neurologist on-call who recommends patient be admitted for observation.  He will load with Depakote.  Will consult hospitalist for admission   10:37 PM Patient had generalized seizure activity here for about a minute.  Given Ativan.  Review of Systems: Positives marked in 'bold' Constitutional: Denies fever, chills, diaphoresis, poor appetite and fatigue.  HEENT: Denies photophobia, eye pain, redness, hearing loss, ear pain, congestion, sore throat, rhinorrhea, sneezing, mouth sores, trouble swallowing, neck pain, neck stiffness and tinnitus.    Respiratory: Denies SOB, DOE, cough, chest tightness,  and wheezing.   Cardiovascular: Denies chest pain, palpitations and leg swelling.  Gastrointestinal: Denies nausea, vomiting, abdominal pain, diarrhea, constipation, blood in stool and abdominal distention.  Genitourinary: Denies dysuria, urgency, frequency, hematuria, flank pain and difficulty urinating.  Musculoskeletal: Denies myalgias, back pain, joint swelling, arthralgias and gait problem.  Skin: Denies pallor, rash and wound.  Neurological: Denies dizziness, seizures, syncope, weakness, light-headedness, numbness and headaches.  Hematological: Denies adenopathy. Easy bruising, personal or family bleeding history  Psychiatric/Behavioral: Denies suicidal ideation, mood changes, confusion, nervousness, sleep disturbance and agitation  Past Medical History: Past Medical History:  Diagnosis Date   Pancreatitis    Pneumothorax    Seizures (HCC)     Past Surgical History:  Procedure Laterality Date   APPENDECTOMY     CHOLECYSTECTOMY     INTERCOSTAL NERVE BLOCK Right 10/30/2020   Procedure: INTERCOSTAL NERVE BLOCK;  Surgeon: Corliss Skains, MD;  Location: [facility] OR;  Service: Thoracic;  Laterality: Right;   VIDEO BRONCHOSCOPY WITH INSERTION OF INTERBRONCHIAL VALVE (IBV) N/A 11/28/2020   Procedure: VIDEO BRONCHOSCOPY WITH INSERTION OF INTERBRONCHIAL VALVE (IBV).;  Surgeon: Corliss Skains, MD;  Location: [facility] OR;  Service: Thoracic;  Laterality: N/A;   XI ROBOTIC ASSISTED THORACOSCOPY PLEURECTOMY Right 10/30/2020   Procedure: PLEURECTOMY;  Surgeon: Cliffton Asters,  Eliezer LoftsHarrell O, MD;  Location: [facility] OR;  Service: Thoracic;  Laterality: Right;    Medications: Prior to Admission medications   Medication Sig Start Date End Date Taking? Authorizing Provider  acetaminophen (TYLENOL) 325 MG tablet Take 2 tablets (650 mg total) by mouth every 6 (six) hours as needed for mild pain (or Fever >/= 101). Patient taking differently: Take 650 mg by mouth  in the morning and at bedtime. 12/11/20  Yes Lama, Sarina IllGagan S, MD  albuterol (VENTOLIN HFA) 108 (90 Base) MCG/ACT inhaler Inhale 2 puffs into the lungs every 6 (six) hours as needed for wheezing or shortness of breath. 12/03/20  Yes Zigmund DanielPowell, A Caldwell Jr., MD  divalproex (DEPAKOTE) 250 MG DR tablet Take 500-750 mg by mouth See admin instructions. Take 2 tablets (500 mg) by mouth every morning and 3 tablets (750 mg) at night   Yes [provider]  folic acid (FOLVITE) 1 MG tablet Take 1 tablet (1 mg total) by mouth daily. 12/03/20 12/03/21 Yes Zigmund DanielPowell, A Caldwell Jr., MD  Lacosamide 100 MG TABS Take 1 tablet (100 mg total) by mouth 2 (two) times daily. 12/11/20  Yes Meredeth IdeLama, Gagan S, MD  pantoprazole (PROTONIX) 20 MG tablet Take 40 mg by mouth every morning.   Yes [provider]  zonisamide (ZONEGRAN) 100 MG capsule Take 3 capsules (300 mg total) by mouth at bedtime. Patient taking differently: Take 100 mg by mouth at bedtime. 04/30/20  Yes Garlon HatchetSanders, Lisa M, PA-C  methocarbamol (ROBAXIN) 500 MG tablet Take 1 tablet (500 mg total) by mouth every 6 (six) hours as needed for muscle spasms. Patient not taking: Reported on 12/13/2020 12/11/20   Meredeth IdeLama, Gagan S, MD    Allergies:  No Known Allergies  Social History:  reports that he has been smoking cigarettes. He has been smoking an average of 2.00 packs per day. He has never used smokeless tobacco. He reports previous alcohol use. He reports previous drug use. Drug: Marijuana.  Family History: Family History  Family history unknown: Yes    Physical Exam: Blood pressure 118/87, pulse 85, temperature 99.3 F (37.4 C), temperature source Oral, resp. rate 18, SpO2 99 %. General: Alert, awake, oriented x3, in no acute distress. Eyes: pink conjunctiva,anicteric sclera, pupils equal and reactive to light and accomodation, HEENT: normocephalic, atraumatic, oropharynx clear Neck: supple, no masses or lymphadenopathy, no goiter, no bruits, no JVD CVS:  Regular rate and rhythm, without murmurs, rubs or gallops. No lower extremity edema Resp : Clear to auscultation bilaterally, no wheezing, rales or rhonchi. GI : Soft, nontender, nondistended, positive bowel sounds, no masses. No hepatomegaly. No hernia.  Musculoskeletal: No clubbing or cyanosis, positive pedal pulses. No contracture. ROM intact. Bandage in place over right chest wall from history of surgery.  Neuro: Grossly intact, no focal neurological deficits, strength 5/5 upper and lower extremities bilaterally Psych: alert and oriented x 3, normal mood and affect Skin: no rashes or lesions, warm and dry   LABS on Admission: I have personally reviewed all the labs and imagings below    Basic Metabolic Panel: Recent Labs  Lab 12/09/20 0507 12/11/20 0305 12/13/20 2044  NA 141 139 139  K 3.3* 3.8 3.6  CL 110 107 107  CO2 22 24 26   GLUCOSE 142* 108* 115*  BUN 18 21* 23*  CREATININE 0.86 0.99 0.92  CALCIUM 9.1 9.1 9.2  MG 1.9  --   --    Liver Function Tests: Recent Labs  Lab 12/09/20 0507 12/13/20 2044  AST 13* 10*  ALT 11 12  ALKPHOS 54 68  BILITOT 0.4 0.4  PROT 6.3* 6.7  ALBUMIN 3.5 3.8   No results for input(s): LIPASE, AMYLASE in the last 168 hours. No results for input(s): AMMONIA in the last 168 hours. CBC: Recent Labs  Lab 12/09/20 0507 12/13/20 2044  WBC 6.2 6.7  NEUTROABS 3.5 4.0  HGB 11.3* 12.4*  HCT 33.6* 37.3*  MCV 92.6 94.2  PLT 238 265   Cardiac Enzymes: Recent Labs  Lab 12/11/20 0305  CKTOTAL 40*   BNP: Invalid input(s): POCBNP CBG: Recent Labs  Lab 12/08/20 2134  GLUCAP 102*    Radiological Exams on Admission:  CT Head Wo Contrast  Result Date: 12/13/2020 CLINICAL DATA:  Seizure, drug overdose EXAM: CT HEAD WITHOUT CONTRAST TECHNIQUE: Contiguous axial images were obtained from the base of the skull through the vertex without intravenous contrast. COMPARISON:  12/08/2020 FINDINGS: Brain: No acute intracranial abnormality.  Specifically, no hemorrhage, hydrocephalus, mass lesion, acute infarction, or significant intracranial injury. Vascular: No hyperdense vessel or unexpected calcification. Skull: No acute calvarial abnormality. Sinuses/Orbits: Visualized paranasal sinuses and mastoids clear. Orbital soft tissues unremarkable. Other: Is none IMPRESSION: Normal study. Electronically Signed   By: Charlett Nose M.D.   On: 12/13/2020 21:24   CT Cervical Spine Wo Contrast  Result Date: 12/13/2020 CLINICAL DATA:  Drug overdose, neck trauma EXAM: CT CERVICAL SPINE WITHOUT CONTRAST TECHNIQUE: Multidetector CT imaging of the cervical spine was performed without intravenous contrast. Multiplanar CT image reconstructions were also generated. COMPARISON:  None. FINDINGS: Alignment: Normal Skull base and vertebrae: No acute fracture. No primary bone lesion or focal pathologic process. Soft tissues and spinal canal: No prevertebral fluid or swelling. No visible canal hematoma. Disc levels: Prior anterior fusion at C5-6. Disc spaces maintained. Upper chest: Emphysema. Postoperative changes and scarring in the right upper lobe. Other: None IMPRESSION: No acute bony abnormality in the cervical spine. Electronically Signed   By: Charlett Nose M.D.   On: 12/13/2020 21:31      EKG: Independently reviewed. NSR.   Assessment/Plan Active Problems:   Recurrent spontaneous pneumothorax   Seizures (HCC)  Seizure vs. Pseudoseizure  Patient has been seen in [facility] several times in past year for concern about seizure like episodes. He was seen 2 days ago in consultation and follow-up with Dr. Melynda Ripple of the epilepsy service.  LTM EEG was unremarkable with differentials of nonepileptic spells versus breakthrough seizure in the setting of medication noncompliance/not receiving medications. Currently on Depakote, zonisamide and Vimpat. Valproic acid levels in the ED mildly on the lower side at 45. Has received Narcan, Versed at jail. Has received Ativan  in ED. CT head/C-spine neg.  -admit to MedSurg  -neuro checks and seizure precautions -neuro consulted, appreciate recs  -given additional loading dose of Depakote by neuro given low level  -continue current dose of antiepileptics: Depakote 500 mg every morning and 750 mg nightly, Vimpat 100 mg twice daily ,Zonisamide 100 mg qhs  -routine EEG, per neuro may need LTM EEG for spell characterization-due to strong suspicion for pseudoseizure episodes. -recommended to only give ativan if seizure >5 min and to page neuro  -UDS   Recurrent Spontaneous Pneumothorax Patient reports pain at surgical site. Bandage is clean/dry/intact. Respiratory status stable. Patient had robot assisted thoracoscopy in May. Additional surgery is planned.  -methocarbamol 500 mg 4 times daily prn -reevaluate pain to determine if additional medications are needed    DVT prophylaxis: Lovenox   CODE STATUS: DNR  Consults called: Neuro    Admission status: Observation   The medical decision making on this patient was of high complexity and the patient is at high risk for clinical deterioration, therefore this is a level 3 admission.  Severity of Illness:      The appropriate patient status for this patient is OBSERVATION. Observation status is judged to be reasonable and necessary in order to provide the required intensity of service to ensure the patient's safety. The patient's presenting symptoms, physical exam findings, and initial radiographic and laboratory data in the context of their medical condition is felt to place them at decreased risk for further clinical deterioration. Furthermore, it is anticipated that the patient will be medically stable for discharge from the hospital within 2 midnights of admission. The following factors support the patient status of observation.   " The patient's presenting symptoms include seizure like activity. " The physical exam findings include none. " The initial  radiographic and laboratory data are normal CT head and C spine, valproic acid level 45.   Time Spent on Admission: 48 min     De Hollingshead D.O.  [facility] 12/14/2020, 12:07 AM

## 2020-12-13 NOTE — ED Triage Notes (Signed)
[facility] comes from jail via [facility] for suspected drug overdose, pt was given 4mg  narcan IN, began to have several seizures witnessed by EMS, given 2.5mg  versed IV by EMS, postictal at this time

## 2020-12-13 NOTE — ED Notes (Signed)
Pt continues to shake in room, pt responds to verbal threat, MD notified

## 2020-12-13 NOTE — Consult Note (Signed)
Neurology Consultation  Reason for Consult: Seizures Referring Physician: Dr. Lorre Nick, ED provider  CC: Seizures  History is obtained from: Patient, chart  HPI: Ronald Butler is a 45 y.o. 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 According to the patient, he has previously had seizures in the setting of missing medications but thinks that he has been getting all his medications as he takes medication in the morning in the evening.  He had multiple episodes in the jail today of what was described as generalized tonic-clonic seizures.  He was also very lethargic.  There was some concern for possible opiate overdose for which she was given Narcan. In the emergency room, he had another seizure and due to the multiplicity of seizures, neurology was consulted. He was seen 2 days ago in consultation and follow-up with Dr. Melynda Ripple of the epilepsy service.  LTM EEG was unremarkable with differentials of nonepileptic spells versus breakthrough seizure in the setting of medication noncompliance/not receiving medications. Patient has had about 9-10 ED visits in the past year for the spells. Currently on Depakote, zonisamide and Vimpat. Vimpat levels in the ED mildly on the lower side at 45.  Patient reports chest pain on the right side where he had surgery done   ROS: Full ROS was performed and is negative except as noted in the HPI.  Past Medical History:  Diagnosis Date   Pancreatitis    Pneumothorax    Seizures (HCC)         Family History  Family history unknown: Yes     Social History:   reports that he has been smoking cigarettes. He has been smoking an average of 2.00 packs per day. He has never used smokeless tobacco. He reports previous alcohol use. He reports previous drug use. Drug: Marijuana.  Medications  Current Facility-Administered Medications:     lactated ringers infusion, , Intravenous, Continuous, Lorre Nick, MD  Current Outpatient Medications:    acetaminophen (TYLENOL) 325 MG tablet, Take 2 tablets (650 mg total) by mouth every 6 (six) hours as needed for mild pain (or Fever >/= 101). (Patient taking differently: Take 650 mg by mouth in the morning and at bedtime.), Disp: , Rfl:    albuterol (VENTOLIN HFA) 108 (90 Base) MCG/ACT inhaler, Inhale 2 puffs into the lungs every 6 (six) hours as needed for wheezing or shortness of breath., Disp: 8 g, Rfl: 2   divalproex (DEPAKOTE) 250 MG DR tablet, Take 500-750 mg by mouth See admin instructions. Take 2 tablets (500 mg) by mouth every morning and 3 tablets (750 mg) at night, Disp: , Rfl:    folic acid (FOLVITE) 1 MG tablet, Take 1 tablet (1 mg total) by mouth daily., Disp: 30 tablet, Rfl: 1   Lacosamide 100 MG TABS, Take 1 tablet (100 mg total) by mouth 2 (two) times daily., Disp: 60 tablet, Rfl: 1   pantoprazole (PROTONIX) 20 MG tablet, Take 40 mg by mouth every morning., Disp: , Rfl:    zonisamide (ZONEGRAN) 100 MG capsule, Take 3 capsules (300 mg total) by mouth at bedtime. (Patient taking differently: Take 100 mg by mouth at bedtime.), Disp: 90 capsule, Rfl: 0   methocarbamol (ROBAXIN) 500 MG tablet, Take 1 tablet (500 mg total) by mouth every 6 (six) hours as needed for muscle spasms. (Patient not taking: Reported on 12/13/2020), Disp: 30 tablet, Rfl: 1   Exam: Current vital  signs: BP 107/79   Pulse 97   Temp 99.3 F (37.4 C) (Oral)   Resp 20   SpO2 98%  Vital signs in last 24 hours: Temp:  [99.3 F (37.4 C)] 99.3 F (37.4 C) (06/15 2039) Pulse Rate:  [97-105] 97 (06/15 2123) Resp:  [20-25] 20 (06/15 2123) BP: (107-116)/(76-82) 107/79 (06/15 2123) SpO2:  [98 %] 98 % (06/15 2123)  On my examination General: Awake alert in no distress HEENT: Normocephalic/atraumatic, no evidence of tongue bite Lungs: Clear, small scar on the right chest CVS: Regular rhythm Abdomen  nondistended nontender Neurological examination  awake alert oriented x3 No dysarthria, no aphasia. Cranial nerves II to XII intact Motor examination with symmetric strength and no drift in any of the 4 extremities. Sensation diminished on the right hemibody in comparison to the left with a sharp cut off in the midline. No dysmetria   Labs I have reviewed labs in epic and the results pertinent to this consultation are:   CBC    Component Value Date/Time   WBC 6.7 12/13/2020 2044   RBC 3.96 (L) 12/13/2020 2044   HGB 12.4 (L) 12/13/2020 2044   HCT 37.3 (L) 12/13/2020 2044   PLT 265 12/13/2020 2044   MCV 94.2 12/13/2020 2044   MCH 31.3 12/13/2020 2044   MCHC 33.2 12/13/2020 2044   RDW 14.2 12/13/2020 2044   LYMPHSABS 1.9 12/13/2020 2044   MONOABS 0.6 12/13/2020 2044   EOSABS 0.1 12/13/2020 2044   BASOSABS 0.1 12/13/2020 2044    CMP     Component Value Date/Time   NA 139 12/13/2020 2044   K 3.6 12/13/2020 2044   CL 107 12/13/2020 2044   CO2 26 12/13/2020 2044   GLUCOSE 115 (H) 12/13/2020 2044   BUN 23 (H) 12/13/2020 2044   CREATININE 0.92 12/13/2020 2044   CALCIUM 9.2 12/13/2020 2044   PROT 6.7 12/13/2020 2044   ALBUMIN 3.8 12/13/2020 2044   AST 10 (L) 12/13/2020 2044   ALT 12 12/13/2020 2044   ALKPHOS 68 12/13/2020 2044   BILITOT 0.4 12/13/2020 2044   GFRNONAA >60 12/13/2020 2044   GFRAA >60 04/05/2018 1417    Lipid Panel     Component Value Date/Time   CHOL 104 04/27/2020 0943   TRIG 77 04/27/2020 0943   HDL 34 (L) 04/27/2020 0943   CHOLHDL 3.1 04/27/2020 0943   VLDL 15 04/27/2020 0943   LDLCALC 55 04/27/2020 0943    Urinary toxicology screen today.  Prior urinary toxicology screen from May 2022 positive for opiates and cocaine.  Imaging I have reviewed the images obtained:  CT-scan of the brain-and CT C-spine-within normal limits with no acute changes  Assessment:  45 year old with a history of seizures versus pseudoseizures presenting for  multiple breakthrough spells/seizure-like episodes. Currently on Depakote zonisamide, Vimpat. Strong suspicion for airfields being nonepileptic in nature. Being admitted for observation due to multiplicity of events and prolonged period of possible postictal status-which I suspect is probably due to receiving multiple doses of benzos. Could be seizures from illicit drug use as well.  Impression: Seizure versus pseudoseizure with multiple breakthrough spells today.  Recommendations: Continue current doses of antiepileptics: -Depakote 500 mg every morning and 750 mg nightly -Vimpat 100 mg twice daily -Zonisamide 100 mg nightly. Additional dose of Depakote-500 mg IV x1-we will order. Ativan only if seizure lasting more than 5 minutes - please do not keep giving Ativan for episode shorter than 5 minutes.  Please also notify neurology. Seizure precautions Routine  EEG  May need LTM EEG for spell characterization-due to strong suspicion for pseudoseizure episodes. Urinary toxicology screen Plan discussed with Dr. Freida Busman in the ED Neurology will follow -- Milon Dikes, MD Neurologist [facility] Pager: [phone]

## 2020-12-14 ENCOUNTER — Observation Stay (HOSPITAL_COMMUNITY)

## 2020-12-14 DIAGNOSIS — F445 Conversion disorder with seizures or convulsions: Secondary | ICD-10-CM | POA: Diagnosis not present

## 2020-12-14 DIAGNOSIS — T402X1A Poisoning by other opioids, accidental (unintentional), initial encounter: Secondary | ICD-10-CM | POA: Diagnosis present

## 2020-12-14 DIAGNOSIS — F17210 Nicotine dependence, cigarettes, uncomplicated: Secondary | ICD-10-CM | POA: Diagnosis present

## 2020-12-14 DIAGNOSIS — G40909 Epilepsy, unspecified, not intractable, without status epilepticus: Secondary | ICD-10-CM | POA: Diagnosis present

## 2020-12-14 DIAGNOSIS — Z66 Do not resuscitate: Secondary | ICD-10-CM | POA: Diagnosis present

## 2020-12-14 DIAGNOSIS — Z79899 Other long term (current) drug therapy: Secondary | ICD-10-CM | POA: Diagnosis not present

## 2020-12-14 DIAGNOSIS — Z765 Malingerer [conscious simulation]: Secondary | ICD-10-CM

## 2020-12-14 DIAGNOSIS — J9383 Other pneumothorax: Secondary | ICD-10-CM | POA: Diagnosis present

## 2020-12-14 DIAGNOSIS — R569 Unspecified convulsions: Secondary | ICD-10-CM | POA: Diagnosis present

## 2020-12-14 LAB — BASIC METABOLIC PANEL
Anion gap: 10 (ref 5–15)
BUN: 20 mg/dL (ref 6–20)
CO2: 21 mmol/L — ABNORMAL LOW (ref 22–32)
Calcium: 8.9 mg/dL (ref 8.9–10.3)
Chloride: 110 mmol/L (ref 98–111)
Creatinine, Ser: 0.79 mg/dL (ref 0.61–1.24)
GFR, Estimated: >60 mL/min (ref >60)
Glucose, Bld: 96 mg/dL (ref 70–99)
Potassium: 3.7 mmol/L (ref 3.5–5.1)
Sodium: 141 mmol/L (ref 135–145)

## 2020-12-14 LAB — CK: Total CK: 33 U/L — ABNORMAL LOW (ref 49–397)

## 2020-12-14 LAB — RAPID URINE DRUG SCREEN, HOSP PERFORMED
Amphetamines: NOT DETECTED
Barbiturates: NOT DETECTED
Benzodiazepines: POSITIVE — AB
Cocaine: NOT DETECTED
Opiates: NOT DETECTED
Tetrahydrocannabinol: NOT DETECTED

## 2020-12-14 LAB — CBC
HCT: 32.5 % — ABNORMAL LOW (ref 39.0–52.0)
Hemoglobin: 11 g/dL — ABNORMAL LOW (ref 13.0–17.0)
MCH: 31.4 pg (ref 26.0–34.0)
MCHC: 33.8 g/dL (ref 30.0–36.0)
MCV: 92.9 fL (ref 80.0–100.0)
Platelets: 241 10*3/uL (ref 150–400)
RBC: 3.5 MIL/uL — ABNORMAL LOW (ref 4.22–5.81)
RDW: 14.3 % (ref 11.5–15.5)
WBC: 7.8 10*3/uL (ref 4.0–10.5)
nRBC: 0 % (ref 0.0–0.2)

## 2020-12-14 MED ORDER — DIVALPROEX SODIUM 500 MG PO DR TAB
750.0000 mg | DELAYED_RELEASE_TABLET | Freq: Every day | ORAL | Status: DC
  Administered 2020-12-14 (×2): 750 mg via ORAL
  Filled 2020-12-14 (×3): qty 1

## 2020-12-14 MED ORDER — PANTOPRAZOLE SODIUM 40 MG PO TBEC
40.0000 mg | DELAYED_RELEASE_TABLET | Freq: Every morning | ORAL | Status: DC
  Administered 2020-12-15: 40 mg via ORAL
  Filled 2020-12-14: qty 1

## 2020-12-14 MED ORDER — DIVALPROEX SODIUM 500 MG PO DR TAB
500.0000 mg | DELAYED_RELEASE_TABLET | Freq: Every day | ORAL | Status: DC
  Administered 2020-12-14 – 2020-12-15 (×2): 500 mg via ORAL
  Filled 2020-12-14 (×2): qty 1

## 2020-12-14 MED ORDER — ACETAMINOPHEN 325 MG PO TABS
650.0000 mg | ORAL_TABLET | ORAL | Status: DC | PRN
  Administered 2020-12-14 – 2020-12-15 (×2): 650 mg via ORAL
  Filled 2020-12-14 (×2): qty 2

## 2020-12-14 MED ORDER — ENOXAPARIN SODIUM 40 MG/0.4ML IJ SOSY
40.0000 mg | PREFILLED_SYRINGE | Freq: Every day | INTRAMUSCULAR | Status: DC
  Filled 2020-12-14 (×2): qty 0.4

## 2020-12-14 MED ORDER — ALBUTEROL SULFATE (2.5 MG/3ML) 0.083% IN NEBU: 2.5000 mg | INHALATION_SOLUTION | Freq: Four times a day (QID) | RESPIRATORY_TRACT | Status: DC | PRN

## 2020-12-14 MED ORDER — METHOCARBAMOL 500 MG PO TABS
500.0000 mg | ORAL_TABLET | Freq: Four times a day (QID) | ORAL | Status: DC | PRN
  Administered 2020-12-14 – 2020-12-15 (×2): 500 mg via ORAL
  Filled 2020-12-14 (×2): qty 1

## 2020-12-14 MED ORDER — LACOSAMIDE 50 MG PO TABS
100.0000 mg | ORAL_TABLET | Freq: Two times a day (BID) | ORAL | Status: DC
  Administered 2020-12-14 – 2020-12-15 (×4): 100 mg via ORAL
  Filled 2020-12-14 (×4): qty 2

## 2020-12-14 MED ORDER — DIVALPROEX SODIUM 500 MG PO DR TAB: 500.0000 mg | DELAYED_RELEASE_TABLET | ORAL | Status: DC

## 2020-12-14 MED ORDER — ZONISAMIDE 100 MG PO CAPS
100.0000 mg | ORAL_CAPSULE | Freq: Every day | ORAL | Status: DC
  Administered 2020-12-14 (×2): 100 mg via ORAL
  Filled 2020-12-14 (×3): qty 1

## 2020-12-14 MED ORDER — LORAZEPAM 2 MG/ML IJ SOLN
4.0000 mg | Freq: Once | INTRAMUSCULAR | Status: AC
  Administered 2020-12-14: 4 mg via INTRAVENOUS
  Filled 2020-12-14: qty 2

## 2020-12-14 NOTE — Progress Notes (Signed)
LTM maint complete - study running online to server.  [facility] monitoring confirmed.

## 2020-12-14 NOTE — Progress Notes (Signed)
MD responded. Ativan 4mg  ordered. Rapid Response Team at patient's bed side. Ativan 4mg  administered. Will continue monitoring.

## 2020-12-14 NOTE — Progress Notes (Signed)
Called to patient's room by officer with apparent seizure activity.  Patient again moaning, tensing muscles, and occasionally twitching.  Stopped for a few minutes than he began again.  Neurologist came in and explained that they are pseudoseizures and he is not to get any ativan or pain medications, that he can twitch for an hour and he will be fine.  NAD noted.  Will continue to monitor.

## 2020-12-14 NOTE — Progress Notes (Addendum)
PROGRESS NOTE  Ronald Butler  GUY:[id number] DOB: 1975/10/05 DOA: 12/13/2020 PCP: Patient, No Pcp Per (Inactive)   Brief Narrative: Ronald Butler is a 45 y.o. male with a history of pancreatitis, PTX and seizure disorder with frequent admission for seizure-like activity (epileptogenic vs. nonepileptogenic) most recently discharged 6/13 to jail from which he returned 6/15 with seizure-like episodes. Labs were reassuring, depakote level mildly low at 45, CT head and cervical spine negative. Neurology recommended depakote load and admission. LTM EEG is underway.   Assessment & Plan: Active Problems:   Recurrent spontaneous pneumothorax   Seizures (HCC)  Seizure-like activity in patient with seizure disorder:  - Monitor with neurochecks, seizure precautions. Only administer ativan is seizure lasts >5 minutes.  - Nonepileptogenic etiology is considered since patient derives gain (release from jail, benzodiazepines) with admission. LTM EEG is ongoing to monitor patient through seizure-like spells today into tomorrow. D/w Dr. Melynda Ripple. - Depakote level slightly low and was loaded. Continue other AEDs as recommended by neurology.   Recent recurrent spontaneous pneumothorax: s/p VATS May 2022. Respiratory status is stable.  - Tylenol prn pain. Would avoid any sedating agents and of course tramadol  DVT prophylaxis: Lovenox Code Status: DNR Family Communication: None at bedside Disposition Plan:  Status is: Inpatient  Remains inpatient appropriate because:Ongoing diagnostic testing needed not appropriate for outpatient work up  Dispo: The patient is from:  Police custody              Anticipated d/c is to:  Police custody              Patient currently is not medically stable to d/c.   Difficult to place patient No  Consultants:  Neurology  Procedures:  LTM EEG  Antimicrobials: None   Subjective: Pt lethargic on my arrival does not respond to verbal stimuli or gentle tactile  stimuli. Does respond briskly to sternal rub and oriented thereafter. Still reports he's sleepy but no pain.  Objective: Vitals:   12/14/20 0030 12/14/20 0725 12/14/20 0900 12/14/20 1100  BP: 106/88 (!) 125/91 104/69 105/70  Pulse: 83 83 81   Resp: 20 20    Temp:   98.1 F (36.7 C) 97.7 F (36.5 C)  TempSrc:   Oral Oral  SpO2: 99% 99% 100%     Intake/Output Summary (Last 24 hours) at 12/14/2020 1326 Last data filed at 12/14/2020 1100 Gross per 24 hour  Intake 1920 ml  Output 300 ml  Net 1620 ml   There were no vitals filed for this visit.  Gen: 45 y.o. male in no distress Pulm: Non-labored breathing room air. Clear to auscultation bilaterally.  CV: Regular borderline tachycardia. No murmur, rub, or gallop. No JVD, or pedal edema. GI: Abdomen soft, non-tender, non-distended, with normoactive bowel sounds. No organomegaly or masses felt. Ext: Warm, no deformities Skin: No rashes, lesions or ulcers on visualized skin. Neuro: Drowsy but rousable and oriented. Doesn't follow commands consistently.  Psych: Calm.  Data Reviewed: I have personally reviewed following labs and imaging studies  CBC: Recent Labs  Lab 12/08/20 2113 12/09/20 0507 12/13/20 2044 12/14/20 0335  WBC 5.7 6.2 6.7 7.8  NEUTROABS  --  3.5 4.0  --   HGB 11.3* 11.3* 12.4* 11.0*  HCT 33.0* 33.6* 37.3* 32.5*  MCV 92.4 92.6 94.2 92.9  PLT 248 238 265 241   Basic Metabolic Panel: Recent Labs  Lab 12/08/20 2113 12/09/20 0507 12/11/20 0305 12/13/20 2044 12/14/20 0335  NA 142 141 139 139 141  K 3.7 3.3* 3.8 3.6 3.7  CL 111 110 107 107 110  CO2 24 22 24 26  21*  GLUCOSE 99 142* 108* 115* 96  BUN 20 18 21* 23* 20  CREATININE 0.79 0.86 0.99 0.92 0.79  CALCIUM 9.2 9.1 9.1 9.2 8.9  MG  --  1.9  --   --   --    GFR: Estimated Creatinine Clearance: 110.5 mL/min (by C-G formula based on SCr of 0.79 mg/dL). Liver Function Tests: Recent Labs  Lab 12/09/20 0507 12/13/20 2044  AST 13* 10*  ALT 11 12   ALKPHOS 54 68  BILITOT 0.4 0.4  PROT 6.3* 6.7  ALBUMIN 3.5 3.8   No results for input(s): LIPASE, AMYLASE in the last 168 hours. No results for input(s): AMMONIA in the last 168 hours. Coagulation Profile: No results for input(s): INR, PROTIME in the last 168 hours. Cardiac Enzymes: Recent Labs  Lab 12/11/20 0305 12/14/20 0921  CKTOTAL 40* 33*   BNP (last 3 results) No results for input(s): PROBNP in the last 8760 hours. HbA1C: No results for input(s): HGBA1C in the last 72 hours. CBG: Recent Labs  Lab 12/08/20 2134  GLUCAP 102*   Lipid Profile: No results for input(s): CHOL, HDL, LDLCALC, TRIG, CHOLHDL, LDLDIRECT in the last 72 hours. Thyroid Function Tests: No results for input(s): TSH, T4TOTAL, FREET4, T3FREE, THYROIDAB in the last 72 hours. Anemia Panel: No results for input(s): VITAMINB12, FOLATE, FERRITIN, TIBC, IRON, RETICCTPCT in the last 72 hours. Urine analysis:    Component Value Date/Time   COLORURINE YELLOW 05/31/2020 0051   APPEARANCEUR CLOUDY (A) 05/31/2020 0051   LABSPEC 1.021 05/31/2020 0051   PHURINE 6.0 05/31/2020 0051   GLUCOSEU NEGATIVE 05/31/2020 0051   HGBUR NEGATIVE 05/31/2020 0051   BILIRUBINUR NEGATIVE 05/31/2020 0051   KETONESUR NEGATIVE 05/31/2020 0051   PROTEINUR NEGATIVE 05/31/2020 0051   NITRITE NEGATIVE 05/31/2020 0051   LEUKOCYTESUR NEGATIVE 05/31/2020 0051   Recent Results (from the past 240 hour(s))  Resp Panel by RT-PCR (Flu A&B, Covid) Nasopharyngeal Swab     Status: None   Collection Time: 12/08/20 11:28 PM   Specimen: Nasopharyngeal Swab; Nasopharyngeal(NP) swabs in vial transport medium  Result Value Ref Range Status   SARS Coronavirus 2 by RT PCR NEGATIVE NEGATIVE Final    Comment: (NOTE) SARS-CoV-2 target nucleic acids are NOT DETECTED.  The SARS-CoV-2 RNA is generally detectable in upper respiratory specimens during the acute phase of infection. The lowest concentration of SARS-CoV-2 viral copies this assay can  detect is 138 copies/mL. A negative result does not preclude SARS-Cov-2 infection and should not be used as the sole basis for treatment or other patient management decisions. A negative result may occur with  improper specimen collection/handling, submission of specimen other than nasopharyngeal swab, presence of viral mutation(s) within the areas targeted by this assay, and inadequate number of viral copies(<138 copies/mL). A negative result must be combined with clinical observations, patient history, and epidemiological information. The expected result is Negative.  Fact Sheet for Patients:  02/07/21  Fact Sheet for Healthcare Providers:  BloggerCourse.com  This test is no t yet approved or cleared by the [place] FDA and  has been authorized for detection and/or diagnosis of SARS-CoV-2 by FDA under an Emergency Use Authorization (EUA). This EUA will remain  in effect (meaning this test can be used) for the duration of the COVID-19 declaration under Section 564(b)(1) of the Act, 21 U.S.C.section 360bbb-3(b)(1), unless the authorization is terminated  or revoked sooner.  Influenza A by PCR NEGATIVE NEGATIVE Final   Influenza B by PCR NEGATIVE NEGATIVE Final    Comment: (NOTE) The Xpert Xpress SARS-CoV-2/FLU/RSV plus assay is intended as an aid in the diagnosis of influenza from Nasopharyngeal swab specimens and should not be used as a sole basis for treatment. Nasal washings and aspirates are unacceptable for Xpert Xpress SARS-CoV-2/FLU/RSV testing.  Fact Sheet for Patients: BloggerCourse.com  Fact Sheet for Healthcare Providers: SeriousBroker.it  This test is not yet approved or cleared by the [place] FDA and has been authorized for detection and/or diagnosis of SARS-CoV-2 by FDA under an Emergency Use Authorization (EUA). This EUA will remain in  effect (meaning this test can be used) for the duration of the COVID-19 declaration under Section 564(b)(1) of the Act, 21 U.S.C. section 360bbb-3(b)(1), unless the authorization is terminated or revoked.  Performed at [facility], [address], [place], [place] [address]       Radiology Studies: CT Head Wo Contrast  Result Date: 12/13/2020 CLINICAL DATA:  Seizure, drug overdose EXAM: CT HEAD WITHOUT CONTRAST TECHNIQUE: Contiguous axial images were obtained from the base of the skull through the vertex without intravenous contrast. COMPARISON:  12/08/2020 FINDINGS: Brain: No acute intracranial abnormality. Specifically, no hemorrhage, hydrocephalus, mass lesion, acute infarction, or significant intracranial injury. Vascular: No hyperdense vessel or unexpected calcification. Skull: No acute calvarial abnormality. Sinuses/Orbits: Visualized paranasal sinuses and mastoids clear. Orbital soft tissues unremarkable. Other: Is none IMPRESSION: Normal study. Electronically Signed   By: Charlett Nose M.D.   On: 12/13/2020 21:24   CT Cervical Spine Wo Contrast  Result Date: 12/13/2020 CLINICAL DATA:  Drug overdose, neck trauma EXAM: CT CERVICAL SPINE WITHOUT CONTRAST TECHNIQUE: Multidetector CT imaging of the cervical spine was performed without intravenous contrast. Multiplanar CT image reconstructions were also generated. COMPARISON:  None. FINDINGS: Alignment: Normal Skull base and vertebrae: No acute fracture. No primary bone lesion or focal pathologic process. Soft tissues and spinal canal: No prevertebral fluid or swelling. No visible canal hematoma. Disc levels: Prior anterior fusion at C5-6. Disc spaces maintained. Upper chest: Emphysema. Postoperative changes and scarring in the right upper lobe. Other: None IMPRESSION: No acute bony abnormality in the cervical spine. Electronically Signed   By: Charlett Nose M.D.   On: 12/13/2020 21:31    Scheduled Meds:  divalproex  500 mg  Oral Daily   And   divalproex  750 mg Oral QHS   enoxaparin (LOVENOX) injection  40 mg Subcutaneous Daily   lacosamide  100 mg Oral BID   pantoprazole  40 mg Oral q morning   zonisamide  100 mg Oral QHS   Continuous Infusions:  lactated ringers 125 mL/hr at 12/14/20 1131     LOS: 0 days   Time spent: 25 minutes.  Tyrone Nine, MD [facility] [email or web address] 12/14/2020, 1:26 PM

## 2020-12-14 NOTE — Plan of Care (Signed)

## 2020-12-14 NOTE — Progress Notes (Signed)
Called to patient's room by officer stating patient was having another seizure.  Upon entering room patient lying on his stomach.  This nurse and Sabino Gasser turned patient onto back and when trying to get wires out from under him, patient lifted his buttocks off the bed so we could get them.  Patient tensing his muscles and moaning.  Lasted a few minutes and after patient appeared to be sleeping or would not respond to this nurses attempt at assessment and questions.  Will continue to monitor.

## 2020-12-14 NOTE — Progress Notes (Signed)
Subjective: Has had few more episodes since admission captured on video EEG and are NON nonepileptic  ROS: Unable to obtain as patient laying in bed with eyes closed and not responding to questions even though normal awake background on EEG.  Examination  Vital signs in last 24 hours: Temp:  [97.7 F (36.5 C)-99.3 F (37.4 C)] 97.7 F (36.5 C) (06/16 1100) Pulse Rate:  [80-105] 81 (06/16 0900) Resp:  [16-25] 20 (06/16 0725) BP: (101-125)/(69-91) 105/70 (06/16 1100) SpO2:  [94 %-100 %] 100 % (06/16 0900)  General: lying in bed, not in apparent distress CVS: pulse-normal rate and rhythm RS: breathing comfortably, CTA B Extremities: normal, warm Neuro: Laying in bed with eyes closed, not responding to questions even though EEG shows normal awake background, patient not cooperating with exam but has been seen spontaneously moving all extremities on video EEG  Basic Metabolic Panel: Recent Labs  Lab 12/08/20 2113 12/09/20 0507 12/11/20 0305 12/13/20 2044 12/14/20 0335  NA 142 141 139 139 141  K 3.7 3.3* 3.8 3.6 3.7  CL 111 110 107 107 110  CO2 24 22 24 26  21*  GLUCOSE 99 142* 108* 115* 96  BUN 20 18 21* 23* 20  CREATININE 0.79 0.86 0.99 0.92 0.79  CALCIUM 9.2 9.1 9.1 9.2 8.9  MG  --  1.9  --   --   --     CBC: Recent Labs  Lab 12/08/20 2113 12/09/20 0507 12/13/20 2044 12/14/20 0335  WBC 5.7 6.2 6.7 7.8  NEUTROABS  --  3.5 4.0  --   HGB 11.3* 11.3* 12.4* 11.0*  HCT 33.0* 33.6* 37.3* 32.5*  MCV 92.4 92.6 94.2 92.9  PLT 248 238 265 241     Coagulation Studies: No results for input(s): LABPROT, INR in the last 72 hours.  Imaging CT head without contrast 12/13/2020: No acute abnormality.  ASSESSMENT AND PLAN: 45 year old incarcerated male with history of seizure-like episodes    Nonepileptic spells/pseudoseizures -LTM EEG captured events consistent with nonepileptic events.  CK within normal limits  Recommendations -Even though it is difficult to rule out  epilepsy with certainty, his current episodes are nonepileptic. I am also concerned about some level of malingering because patient was not cooperating with exam even though he was clearly awake as seen on EEG -I discussed with patient's RN and Dr. 55 that patient has been having nonepileptic events and do not administer Ativan for these episodes.  -For now, recommend continuing his current AEDs without any increase in dose -Recommend continue video EEG monitoring overnight for further characterization of spells.  However, if no evidence of epilepsy by tomorrow, will be ready for discharge from neuro standpoint -Patient does not need to be brought in for seizure-like episodes unless has significant change in vital signs (heart rate more than 90, blood pressure more than 160/90, SPO2 less than 95), has visible injury due to these episodes. -Can consider psychiatry consult for conversion disorder vs malingering if patient continues to have these episodes to minimize return to hospital for these episodes -Management of rest of comorbidities per primary team   I have spent a total of  35 minutes with the patient reviewing hospital notes,  test results, labs and examining the patient as well as establishing an assessment and plan that was discussed personally with the patient.  > 50% of time was spent in direct patient care.         Jarvis Newcomer Epilepsy [facility] For questions after 5pm please refer  to AMION to reach the Neurologist on call

## 2020-12-14 NOTE — Progress Notes (Signed)
LTM started; no initial skin breakdown was seen.; [facility] to monitor; nurse educated on event button.

## 2020-12-14 NOTE — Progress Notes (Signed)
Patient stating that he wants to talk to the doctor about his lung and when he is going to have surgery.  Spoke with Dr. Jarvis Newcomer and he stated that it is noted patient is to have another surgery in the future but he does not know any more details than that.  He has already seen the patient today and is not going to come back to discuss this, that the covering hospitalist will see him in the morning.  Informed patient of this.  Patient upset asking why we will not give him any more pain medications.  Offered him tylenol and informed him that the physicians will not be ordering any stronger pain medications due to him being here for seizure monitoring.  Reminded patient that both physicians have discussed this with him today.  He stated that he does not remember that, he just remembers a doctor telling him this morning that he would give him pain meds.  Patient asked to speak to whomever was above this nurse.  Informed him that he already spoke to the manager of the unit and that she has gone home for today.  Reminded him that she ordered the aqua-k heating pad to help with his pain, which he is not even using at this time, and informed him that they will not be able to do anything else for him in regards to his pain.  The officer in the room reminded him that is what happened and that he is not going to get anything else.  He stated, "That's not fair.  What am I supposed to do, just lay here in pain?"  Asked him if he would like the tylenol and he stated, "No, I can get that in jail.  So can I just be discharged?"  Informed him that he is on the EEG and is not going to be discharged tonight.  Asked if there was anything else I could do for him and he denied and continued to argue with the officer about getting discharged.  Joni Reining, charge nurse, informed of situation.

## 2020-12-14 NOTE — Progress Notes (Signed)
I was notified by Marikay Alar NP regarding pt having a recent seizure. Upon my arrival, Ronald Butler had a brief generalized seizure about 10-15 secs in length. Currently nonverbal, responds to noxious stimuli, spontaneously breathing without any evidence of airway obstruction. Order received for Ativan and administered by [employer]. Will continue to monitor.

## 2020-12-14 NOTE — Progress Notes (Signed)
Writer was called to be notified that patient was having episodes of seizure activities, Patient assessed, seizure precautions in place, MD ( [facility] ) notified. Awaiting response from MD. Will continue monitoring.

## 2020-12-14 NOTE — Progress Notes (Signed)
Since last 'seizure activity' patient awake and alert.  Did not want breakfast but requested coffee and pain medications.  Stated he is having a headache and lung pain.  States oxycodone doesn't work and is requesting dilaudid.  Informed him that I will page the MD but it is not likely he will get dilaudid prescribed with what is going on.  Urine drug screen collected.  Called into patient's room again at 0930 for apparent seizure activity.  Patient tensing arm and leg muscles and moaning.  No other s/s.  Lasted for about 4 minutes and again patient afterwards would not or could not answer to this nurse.  Will monitor.  Vitals remain stable and oxygen saturations remained in upper 90's throughout.

## 2020-12-15 DIAGNOSIS — J9383 Other pneumothorax: Secondary | ICD-10-CM

## 2020-12-15 LAB — CK: Total CK: 32 U/L — ABNORMAL LOW (ref 49–397)

## 2020-12-15 MED ORDER — ZONISAMIDE 100 MG PO CAPS: 100.0000 mg | ORAL_CAPSULE | Freq: Every day | ORAL | 0 refills | Status: AC

## 2020-12-15 NOTE — Procedures (Signed)
Patient Name: Ronald Butler  MRN: [id number]  Epilepsy Attending: Charlsie Quest  Referring Physician/Provider: Dr Lindie Spruce Duration: 12/14/2020 1038 to 12/15/2020 0845   Patient history: 45 y.o. 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  EEG to evaluate for seizures.   Level of alertness: Awake, asleep   AEDs during EEG study: Depakote, zonisamide, Vimpat   Technical aspects: This EEG study was done with scalp electrodes positioned according to the 10-20 International system of electrode placement. Electrical activity was acquired at a sampling rate of 500Hz  and reviewed with a high frequency filter of 70Hz  and a low frequency filter of 1Hz . EEG data were recorded continuously and digitally stored.   Description: The posterior dominant rhythm consists of 9 Hz activity of moderate voltage (25-35 uV) seen predominantly in posterior head regions, symmetric and reactive to eye opening and eye closing. Sleep was characterized by vertex waves, sleep spindles (12 to 14 Hz), maximal frontocentral region. Hyperventilation and photic stimulation were not performed.     Patient was noted to have intermittent episodes of nonrhythmic whole-body twitching which was waxing and waning in frequency and amplitude.  Concomitant EEG before, during and after the event showed normal posterior dominant rhythm.   IMPRESSION: This study is within normal limits. No seizures or epileptiform discharges were seen throughout the recording.  Patient was noted to have intermittent episodes of nonrhythmic whole-body twitching without concomitant EEG change. These were nonepileptic events.   Ronald Butler 

## 2020-12-15 NOTE — Discharge Summary (Signed)
[facility] Discharge Summary   Patient: Ronald Butler OIZ:[id number]  PCP: Patient, No Pcp Per (Inactive)  Date of admission: 12/13/2020   Date of discharge:  12/15/2020     Discharge Diagnoses:  Principal diagnosis Nonconvulsive seizures.  Active Problems:   Recurrent spontaneous pneumothorax   Seizures (HCC)   [facility] From: [facility] Disposition:   Back to present self-care  Recommendations for Outpatient Follow-up:  PCP: Follow-up with PCP in 1 week. Establish care with psychiatry.   Follow-up Information     Psychiatry. Schedule an appointment as soon as possible for a visit in 2 week(s).   Why: establish care for non-epileptic seizures.               Discharge Instructions     Ambulatory referral to Neurology   Complete by: As directed    An appointment is requested in approximately: 4 weeks   Diet - low sodium heart healthy   Complete by: As directed    Increase activity slowly   Complete by: As directed    No wound care   Complete by: As directed        Diet recommendation: Regular diet  Activity: The patient is advised to gradually reintroduce usual activities, as tolerated  Discharge Condition: stable  Code Status: Full code   History of present illness: As per the H and P dictated on admission, "Ronald Butler is a 45 y.o. male with PMH of seizure disorder, substance abuse, and recurrent pneumothorax who presents for witnessed seizure while in custody of [place] [facility]. Patient given Narcan prior to this for suspected drug overdose.  Patient had multiple seizures which required EMS to be called.  They gave the patient Versed 2.5 mg and transported him here. He was apparently initially somnolent but is not awake and alert. He was discharged from hospital 2 days prior. At that time was also admitted for seizure like activity. He had EEG which was normal however the neurologist felt that he could still have epilepsy.  Recommended no changes  to his AEDs other than patient being on Vimpat. Patient reports being compliant with medications. Denies ingestion of any illicit substances. "  Hospital Course:  Summary of his active problems in the hospital is as following. Seizure-like activity in patient with seizure disorder: Nonepileptic events Admitted to progressive care unit. Neurology was consulted. LTM EEG performed on which patient had seizure-like spells without any EEG spikes suggestive of seizures. Also likely malingering possibility given that patient was awake perr EEG but nonresponsive neurology will do the patient. Highly appreciate neurology input. At present no changes recommended for his seizure medications. Patient will require psychiatrist referral which prison will be able to manage. Patient does not need to be brought in for seizure-like episode unless has a significant change in vital signs or visible injury. Depakote level slightly low and was loaded. Continue other AEDs as recommended by neurology.   Recent recurrent spontaneous pneumothorax:  s/p VATS May 2022. Respiratory status is stable.  Tylenol prn pain. Would avoid any sedating agents and of course tramadol Outpatient follow-up with cardiothoracic surgery as recommended.  Wheezing. Patient reported some wheezing.  On my examination did not have any wheezing but then later on patient was inducing some wheezing which appeared to be more upper airway. Recommends using inhalers.  Patient was ambulatory without any assistance. On the day of the discharge the patient's vitals were stable, and no other new acute medical condition were reported. The patient was  felt safe to be discharge at [facility].  Consultants: Neurology  Procedures: EEG  DISCHARGE MEDICATION: Allergies as of 12/15/2020   No Known Allergies      Medication List     TAKE these medications    albuterol 108 (90 Base) MCG/ACT inhaler Commonly known as: VENTOLIN HFA Inhale 2 puffs  into the lungs every 6 (six) hours as needed for wheezing or shortness of breath.   divalproex 250 MG DR tablet Commonly known as: DEPAKOTE Take 500-750 mg by mouth See admin instructions. Take 2 tablets (500 mg) by mouth every morning and 3 tablets (750 mg) at night   folic acid 1 MG tablet Commonly known as: FOLVITE Take 1 tablet (1 mg total) by mouth daily.   Lacosamide 100 MG Tabs Take 1 tablet (100 mg total) by mouth 2 (two) times daily.   methocarbamol 500 MG tablet Commonly known as: ROBAXIN Take 1 tablet (500 mg total) by mouth every 6 (six) hours as needed for muscle spasms.   pantoprazole 20 MG tablet Commonly known as: PROTONIX Take 40 mg by mouth every morning.   zonisamide 100 MG capsule Commonly known as: ZONEGRAN Take 1 capsule (100 mg total) by mouth at bedtime.       ASK your doctor about these medications    acetaminophen 325 MG tablet Commonly known as: TYLENOL Take 2 tablets (650 mg total) by mouth every 6 (six) hours as needed for mild pain (or Fever >/= 101).        Discharge Exam: There were no vitals filed for this visit. Vitals:   12/15/20 0319 12/15/20 0734  BP: 113/76 122/83  Pulse: 79 80  Resp: 16 15  Temp: 98.3 F (36.8 C) 97.9 F (36.6 C)  SpO2: 98% 100%   General: Appear in mild distress, no Rash; Oral Mucosa Clear, moist. no Abnormal Neck Mass Or lumps, Conjunctiva normal  Cardiovascular: S1 and S2 Present, no Murmur, Respiratory: good respiratory effort, Bilateral Air entry present and CTA, no Crackles, no wheezes Abdomen: Bowel Sound present, Soft and no tenderness Extremities: no Pedal edema Neurology: alert and oriented to time, place, and person affect appropriate. no new focal deficit Gait not checked due to patient safety concerns    The results of significant diagnostics from this hospitalization (including imaging, microbiology, ancillary and laboratory) are listed below for reference.    Significant Diagnostic  Studies: CT Head Wo Contrast  Result Date: 12/13/2020 CLINICAL DATA:  Seizure, drug overdose EXAM: CT HEAD WITHOUT CONTRAST TECHNIQUE: Contiguous axial images were obtained from the base of the skull through the vertex without intravenous contrast. COMPARISON:  12/08/2020 FINDINGS: Brain: No acute intracranial abnormality. Specifically, no hemorrhage, hydrocephalus, mass lesion, acute infarction, or significant intracranial injury. Vascular: No hyperdense vessel or unexpected calcification. Skull: No acute calvarial abnormality. Sinuses/Orbits: Visualized paranasal sinuses and mastoids clear. Orbital soft tissues unremarkable. Other: Is none IMPRESSION: Normal study. Electronically Signed   By: Charlett Nose M.D.   On: 12/13/2020 21:24   CT Head Wo Contrast  Result Date: 12/08/2020 CLINICAL DATA:  Seizure, fall EXAM: CT HEAD WITHOUT CONTRAST TECHNIQUE: Contiguous axial images were obtained from the base of the skull through the vertex without intravenous contrast. COMPARISON:  05/30/2020 FINDINGS: Brain: No acute intracranial abnormality. Specifically, no hemorrhage, hydrocephalus, mass lesion, acute infarction, or significant intracranial injury. Vascular: No hyperdense vessel or unexpected calcification. Skull: No acute calvarial abnormality. Sinuses/Orbits: No acute findings Other: None IMPRESSION: Normal study. Electronically Signed   By: Charlett Nose M.D.  On: 12/08/2020 22:34   CT chest without contrast  Result Date: 11/16/2020 CLINICAL DATA:  Pneumothorax with persistent air leak. Evaluate for blebs. EXAM: CT CHEST WITHOUT CONTRAST TECHNIQUE: Multidetector CT imaging of the chest was performed following the standard protocol without IV contrast. COMPARISON:  None. FINDINGS: Cardiovascular: Limited evaluation in the absence of intravenous contrast. No evidence of aneurysm. Heart is normal in size. No pericardial effusion. Mediastinum/Nodes: Unremarkable CT appearance of the thyroid gland. No  suspicious mediastinal or hilar adenopathy. No soft tissue mediastinal mass. The thoracic esophagus is unremarkable. Lungs/Pleura: Surgical changes of prior blebectomy at the right lung apex and superior segment of the right lower lobe. There are a few small blebs along the anterior margin of the right lung apex. Centrilobular pulmonary emphysema is also present. Small right basilar pneumothorax. Incomplete re-expansion of the superior segment of the right lower lobe. Thoracostomy tube in place entering via the right 9-10 rib interspace. Upper Abdomen: No acute abnormality. Incompletely imaged pneumobilia presumably related to prior sphincterotomy. Musculoskeletal: No acute fracture or aggressive appearing lytic or blastic osseous lesion. IMPRESSION: 1. Small residual right basilar pneumothorax also with incomplete re-expansion of the superior segment of the right lower lobe despite well-positioned thoracostomy tube. 2. Evidence of prior surgical resection of pulmonary blebs at the right lung apex and superior segment of the right lower lobe. 3. Three small blebs present along the anterior aspect of the right lung apex. 4. Mild centrilobular pulmonary emphysema. Emphysema (ICD10-J43.9). Electronically Signed   By: Malachy MoanHeath  McCullough M.D.   On: 11/16/2020 08:13   CT Cervical Spine Wo Contrast  Result Date: 12/13/2020 CLINICAL DATA:  Drug overdose, neck trauma EXAM: CT CERVICAL SPINE WITHOUT CONTRAST TECHNIQUE: Multidetector CT imaging of the cervical spine was performed without intravenous contrast. Multiplanar CT image reconstructions were also generated. COMPARISON:  None. FINDINGS: Alignment: Normal Skull base and vertebrae: No acute fracture. No primary bone lesion or focal pathologic process. Soft tissues and spinal canal: No prevertebral fluid or swelling. No visible canal hematoma. Disc levels: Prior anterior fusion at C5-6. Disc spaces maintained. Upper chest: Emphysema. Postoperative changes and scarring  in the right upper lobe. Other: None IMPRESSION: No acute bony abnormality in the cervical spine. Electronically Signed   By: Charlett NoseKevin  Dover M.D.   On: 12/13/2020 21:31   CT Cervical Spine Wo Contrast  Result Date: 12/08/2020 CLINICAL DATA:  Seizure EXAM: CT CERVICAL SPINE WITHOUT CONTRAST TECHNIQUE: Multidetector CT imaging of the cervical spine was performed without intravenous contrast. Multiplanar CT image reconstructions were also generated. COMPARISON:  None FINDINGS: Alignment: Normal Skull base and vertebrae: No acute fracture. No primary bone lesion or focal pathologic process. Soft tissues and spinal canal: No prevertebral fluid or swelling. No visible canal hematoma. Disc levels: Prior ACDF at C5-6. Remainder of the disc spaces maintained. Upper chest: Postoperative changes in the right apex with scarring. Other: None IMPRESSION: No acute bony abnormality. Electronically Signed   By: Charlett NoseKevin  Dover M.D.   On: 12/08/2020 22:36   DG Chest Portable 1 View  Result Date: 12/08/2020 CLINICAL DATA:  Cough and seizure activity EXAM: PORTABLE CHEST 1 VIEW COMPARISON:  12/05/2020 FINDINGS: Cardiac shadow is stable. Multiple endobronchial valves are seen on the right. Lungs are clear. No pneumothorax or focal infiltrate is seen. No acute bony abnormality is noted. IMPRESSION: Multiple endobronchial valves are noted on the right. No acute abnormality seen. Electronically Signed   By: Alcide CleverMark  Lukens M.D.   On: 12/08/2020 21:43   DG Chest [facility]  1 View  Result Date: 12/05/2020 CLINICAL DATA:  Altered mental status. EXAM: PORTABLE CHEST 1 VIEW COMPARISON:  December 03, 2020 FINDINGS: There is no evidence of acute infiltrate, pleural effusion or pneumothorax. Surgical sutures are seen overlying the right apex. Mild right apical pleural thickening is also noted. Multiple endobronchial valves are seen along the suprahilar and infrahilar region on the right. The heart size and mediastinal contours are within normal limits.  The visualized skeletal structures are unremarkable. IMPRESSION: No active cardiopulmonary disease. Electronically Signed   By: Aram Candela M.D.   On: 12/05/2020 21:07   DG CHEST PORT 1 VIEW  Result Date: 12/03/2020 CLINICAL DATA:  Shortness of breath RIGHT apical pneumothorax, follow-up EXAM: PORTABLE CHEST 1 VIEW COMPARISON:  Portable exam 1110 hours compared to 11/30/2020 FINDINGS: Endobronchial valves project over RIGHT perihilar region. Normal heart size, mediastinal contours, and pulmonary vascularity. Lungs appear emphysematous but clear. No pulmonary infiltrate or pleural effusion identified. Persistent small RIGHT apex pneumothorax is likely present, suboptimally seen due to superimposed osseous structures, with no pulmonary markings seen cranial to the RIGHT apex staple line. Bones unremarkable. 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 Electronically Signed   By: Ulyses Southward M.D.   On: 12/03/2020 10:49   DG CHEST PORT 1 VIEW  Result Date: 11/30/2020 CLINICAL DATA:  Chest tube removal EXAM: PORTABLE CHEST 1 VIEW COMPARISON:  11/29/2020 FINDINGS: Right upper lobectomy. Multiple endobronchial valves on the right. Small right apical pneumothorax approximately 10 mm in thickness. No significant interval change. No pleural effusion. Lungs are well aerated and clear. IMPRESSION: Small right apical pneumothorax unchanged. Electronically Signed   By: Marlan Palau M.D.   On: 11/30/2020 10:16   DG CHEST PORT 1 VIEW  Result Date: 11/29/2020 CLINICAL DATA:  Shortness of breath.  Right chest tube removal. EXAM: PORTABLE CHEST 1 VIEW COMPARISON:  Earlier same day FINDINGS: Right chest tube is been removed. Pleural chest tube track remains visible as is often seen. Tiny amount of pleural air at the right apex is not increased. The right lung remains well aerated. Multiple airway valves redemonstrated. Left chest remains clear. IMPRESSION: No change other than removal of  the right chest tube. Small amount of pleural air persists at the right apex. Electronically Signed   By: Paulina Fusi M.D.   On: 11/29/2020 11:06   DG Chest Port 1 View  Result Date: 11/29/2020 CLINICAL DATA:  Chest tube.  Bronchial valve placement. EXAM: PORTABLE CHEST 1 VIEW COMPARISON:  11/27/2020. FINDINGS: Right chest tube in stable position. Stable small right apical pneumothorax again noted. Bronchial valves noted on the right. No focal infiltrate. Mild bibasilar subsegmental atelectasis. Heart size normal. Prior cervical spine fusion. Surgical clips right upper quadrant. IMPRESSION: 1. Right chest tube in stable position. Small right apical pneumothorax again noted. 2. Bronchial valves noted the right. Mild bibasilar subsegmental atelectasis. Electronically Signed   By: Maisie Fus  Register   On: 11/29/2020 07:05   DG Chest Port 1 View  Result Date: 11/28/2020 CLINICAL DATA:  Right pneumothorax.  Chest tube. EXAM: PORTABLE CHEST 1 VIEW COMPARISON:  11/27/2020. FINDINGS: Right chest tube in stable position. Right base pneumothorax has improved with minimal residual. Stable tiny right apical pneumothorax. Lungs are clear. Heart size normal. No displaced rib fracture noted. IMPRESSION: Right chest tube in stable position. Right pneumothorax has improved with minimal residual. Stable tiny right apical pneumothorax. Electronically Signed   By: Maisie Fus  Register   On: 11/28/2020 05:51   DG  CHEST PORT 1 VIEW  Result Date: 11/27/2020 CLINICAL DATA:  45 year old male with history of pneumothorax. EXAM: PORTABLE CHEST 1 VIEW COMPARISON:  Chest x-[place] 11/26/2020. FINDINGS: Right-sided chest tube remains in position with tip in the apex of the right hemithorax. There continues to be a right sided pneumothorax which is very small in the apex of the right hemithorax, but there is a much larger basilar component on today's examination. No left pneumothorax. Lungs are clear bilaterally. No pleural effusions. No  evidence of pulmonary edema. Heart size is normal. Mediastinal contours are within normal limits. IMPRESSION: 1. Enlarging right-sided pneumothorax, predominantly in the base of the right hemithorax, as above. Right chest tube is stable in position. Electronically Signed   By: Trudie Reed M.D.   On: 11/27/2020 08:14   DG Chest Port 1 View  Result Date: 11/26/2020 CLINICAL DATA:  Evaluate right-sided pneumothorax EXAM: PORTABLE CHEST 1 VIEW COMPARISON:  11/25/2020; 11/22/2020; 11/20/2020; 11/17/2020 FINDINGS: Grossly unchanged cardiac silhouette and mediastinal contours. Stable postoperative change of the right hilum and right lung apex. Stable positioning of right-sided chest tube without definitive evidence of pneumothorax. The left hemithorax remains well aerated. No new focal airspace opacities. No pleural effusion. No evidence of edema. No acute osseous abnormalities. IMPRESSION: 1. Stable positioning of right-sided chest tube without definitive evidence pneumothorax. 2. Stable postoperative change of the right lung apex and hilum. Electronically Signed   By: Simonne Come M.D.   On: 11/26/2020 08:16   DG CHEST PORT 1 VIEW  Result Date: 11/25/2020 CLINICAL DATA:  Pneumothorax EXAM: PORTABLE CHEST 1 VIEW COMPARISON:  11/22/2020 FINDINGS: Tiny right apical pneumothorax, grossly unchanged, with indwelling right chest tube. Left lung is clear. No pleural effusion. The heart is normal in size. IMPRESSION: Tiny right apical pneumothorax, grossly unchanged, with indwelling right chest tube. Electronically Signed   By: Charline Bills M.D.   On: 11/25/2020 08:08   DG CHEST PORT 1 VIEW  Result Date: 11/22/2020 CLINICAL DATA:  Pneumothorax.  Chest tube. EXAM: PORTABLE CHEST 1 VIEW COMPARISON:  CT chest 11/16/2020, chest x-[place] 11/21/2020 FINDINGS: Right chest tube in similar position. The heart size and mediastinal contours are within normal limits. No focal consolidation. No pulmonary edema. No pleural  effusion. Interval decrease in now trace right pneumothorax. Trace right apical pleural fluid again noted. No left pneumothorax. No acute osseous abnormality. IMPRESSION: Interval increase in now trace right pneumothorax with a right chest tube in similar position. Electronically Signed   By: Tish Frederickson M.D.   On: 11/22/2020 06:46   DG CHEST PORT 1 VIEW  Result Date: 11/21/2020 CLINICAL DATA:  Pneumothorax.  Chest tube. EXAM: PORTABLE CHEST 1 VIEW COMPARISON:  11/20/2020. FINDINGS: Right chest tube in stable position. Stable tiny right-sided pneumothorax again noted. Left costophrenic angle incompletely imaged. Lungs are clear. No pleural effusion identified. Heart size normal. No acute bony abnormality. Prior cervical spine fusion. IMPRESSION: Right chest tube in stable position. Stable tiny right-sided pneumothorax again noted. Chest is unchanged from prior exam. Electronically Signed   By: Maisie Fus  Register   On: 11/21/2020 07:05   DG CHEST PORT 1 VIEW  Result Date: 11/20/2020 CLINICAL DATA:  Pneumothorax and chest tube. EXAM: PORTABLE CHEST 1 VIEW COMPARISON:  11/18/2020. FINDINGS: Stable position of the right chest tube. Again noted is a very small lateral right pneumothorax. Left lung remains clear. Negative for left pneumothorax. Heart and mediastinum are stable. Trachea is midline. Partially visualized surgical hardware in the lower cervical spine. IMPRESSION: Stable small  right pneumothorax. Stable position of the right chest tube. No focal lung disease. Electronically Signed   By: Richarda Overlie M.D.   On: 11/20/2020 08:39   DG CHEST PORT 1 VIEW  Result Date: 11/18/2020 CLINICAL DATA:  Evaluate pneumothorax EXAM: PORTABLE CHEST 1 VIEW COMPARISON:  Nov 17, 2020 FINDINGS: The basilar right pneumothorax is improved. There is now a small right lateral component measuring 6 mm, not seen on the previous study. The right chest tube remains in place. No left-sided pneumothorax. The cardiomediastinal  silhouette is stable. No other acute abnormalities. IMPRESSION: The right chest tube remains in good position. Overall, the right-sided pneumothorax is smaller in the interval. Only a small lateral component measuring 6 mm is identified. Electronically Signed   By: Gerome Sam III M.D   On: 11/18/2020 09:17   DG CHEST PORT 1 VIEW  Result Date: 11/17/2020 CLINICAL DATA:  Pneumothorax EXAM: PORTABLE CHEST 1 VIEW COMPARISON:  Nov 17, 2020 study obtained earlier in the day FINDINGS: Chest tube position unchanged. There has been interval enlargement of a right base right base pneumothorax without tension component. There is atelectatic change in the right base. Lungs elsewhere clear. Heart size and pulmonary vascularity are normal. No adenopathy. No bone lesions. Right base pneumothorax now present without tension component. IMPRESSION: Interval enlargement of right base pneumothorax without tension component. Chest tube position unchanged. Right base atelectasis. Lungs elsewhere clear. Stable cardiac silhouette. Critical Value/emergent results were called by telephone at the time of interpretation on 11/17/2020 at 4:19 pm to provider Newton Pigg, RN, who verbally acknowledged these results. She informed me that the provider was aware of the findings on this radiograph and had made appropriate adjustments to the chest tube. Electronically Signed   By: Bretta Bang III M.D.   On: 11/17/2020 16:19   DG CHEST PORT 1 VIEW  Result Date: 11/17/2020 CLINICAL DATA:  Chest pain.  Chest tube in place EXAM: PORTABLE CHEST 1 VIEW COMPARISON:  Chest radiograph and chest CT Nov 16, 2020 FINDINGS: Chest tube remains on the right. Right basilar pneumothorax again noted, better seen by CT. The focal area of presumed scarring in the right apex is less well seen by radiography than CT. No edema or airspace opacity. Heart size and pulmonary vascularity normal. No adenopathy. Postoperative change lower cervical spine.  IMPRESSION: Chest tube unchanged in position on the right with persistent right basilar pneumothorax. No tension component. Scarring right apex better seen on CT. No edema or airspace opacity. Heart size normal Electronically Signed   By: Bretta Bang III M.D.   On: 11/17/2020 08:20   DG CHEST PORT 1 VIEW  Result Date: 11/16/2020 CLINICAL DATA:  Right-sided chest tube. EXAM: PORTABLE CHEST 1 VIEW COMPARISON:  11/15/2020. FINDINGS: Right chest tube in stable position. Persistent right base pneumothorax. Postsurgical changes right lung. No focal infiltrate. No pleural effusion. No acute bony abnormality. Prior cervical spine fusion. Surgical clips upper abdomen. IMPRESSION: Right chest tube in stable position. Persistent right base pneumothorax. Electronically Signed   By: Maisie Fus  Register   On: 11/16/2020 05:55   DG Chest Port 1V same Day  Result Date: 11/21/2020 CLINICAL DATA:  Right pneumothorax.  Chest tube clamped. EXAM: PORTABLE CHEST 1 VIEW COMPARISON:  Chest x-[place] 11/21/2020. FINDINGS: Right chest tube in stable position. Progressive right base pneumothorax. Postsurgical changes right lung. Atelectatic changes right lung base. No pleural effusion. Heart size normal. Thoracic spine scoliosis. Postsurgical changes cervical spine. IMPRESSION: Right chest tube in stable position. Progressive  prominent right base pneumothorax. Consider unclamping right chest tube. Critical Value/emergent results were called by telephone at the time of interpretation on 11/21/2020 at 1:51 pm to nurse Lurena Joiner, who verbally acknowledged these results. Electronically Signed   By: Maisie Fus  Register   On: 11/21/2020 13:53   Overnight EEG with video  Result Date: 12/15/2020 Charlsie Quest, MD     12/15/2020  9:29 AM Patient Name: Ronald Butler MRN: [id number] Epilepsy Attending: Charlsie Quest Referring Physician/Provider: Dr Lindie Spruce Duration: 12/14/2020 1038 to 12/15/2020 0845  Patient history: 45 y.o. 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  EEG to evaluate for seizures.  Level of alertness: Awake, asleep  AEDs during EEG study: Depakote, zonisamide, Vimpat  Technical aspects: This EEG study was done with scalp electrodes positioned according to the 10-20 International system of electrode placement. Electrical activity was acquired at a sampling rate of 500Hz  and reviewed with a high frequency filter of 70Hz  and a low frequency filter of 1Hz . EEG data were recorded continuously and digitally stored.  Description: The posterior dominant rhythm consists of 9 Hz activity of moderate voltage (25-35 uV) seen predominantly in posterior head regions, symmetric and reactive to eye opening and eye closing. Sleep was characterized by vertex waves, sleep spindles (12 to 14 Hz), maximal frontocentral region. Hyperventilation and photic stimulation were not performed.   Patient was noted to have intermittent episodes of nonrhythmic whole-body twitching which was waxing and waning in frequency and amplitude.  Concomitant EEG before, during and after the event showed normal posterior dominant rhythm.  IMPRESSION: This study is within normal limits. No seizures or epileptiform discharges were seen throughout the recording. Patient was noted to have intermittent episodes of nonrhythmic whole-body twitching without concomitant EEG change. These were nonepileptic events.  Priyanka Annabelle Harman   Overnight EEG with video  Result Date: 12/11/2020 Charlsie Quest, MD     12/11/2020  1:24 PM Patient Name: Ronald Butler MRN: [id number] Epilepsy Attending: Charlsie Quest Referring Physician/Provider: Dr Bing Neighbors Duration: 12/10/2020 1548 to 12/11/2020 1153 Patient history: 45 y.o. 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  EEG to evaluate for seizures. Level of alertness: Awake, asleep AEDs during EEG study: Depakote, zonisamide, Vimpat Technical aspects: This EEG study was done with scalp electrodes positioned according to the 10-20 International system of electrode placement. Electrical activity was acquired at a sampling rate of 500Hz  and reviewed with a high frequency filter of 70Hz  and a low frequency filter of 1Hz . EEG data were recorded continuously and digitally stored. Description: The posterior dominant rhythm consists of 9 Hz activity of moderate voltage (25-35 uV) seen predominantly in posterior head regions, symmetric and reactive to eye opening and eye closing. Sleep was characterized by vertex waves, sleep spindles (12 to 14 Hz), maximal frontocentral region. Hyperventilation and photic stimulation were not performed.   IMPRESSION: This study is within normal limits. No seizures or epileptiform discharges were seen throughout the recording. Charlsie Quest    Microbiology: Recent Results (from the past 240 hour(s))  Resp Panel by RT-PCR (Flu A&B, Covid) Nasopharyngeal Swab     Status: None   Collection Time: 12/08/20 11:28  PM   Specimen: Nasopharyngeal Swab; Nasopharyngeal(NP) swabs in vial transport medium  Result Value Ref Range Status   SARS Coronavirus 2 by RT PCR NEGATIVE NEGATIVE Final    Comment: (NOTE) SARS-CoV-2 target nucleic acids are NOT DETECTED.  The SARS-CoV-2 RNA is generally detectable in upper respiratory specimens during the acute phase of infection. The lowest concentration of SARS-CoV-2 viral copies this assay can detect is 138 copies/mL. A negative result does not preclude SARS-Cov-2 infection and should not be used as the sole basis for treatment or other patient management decisions. A negative result may occur with  improper specimen collection/handling, submission of specimen other than  nasopharyngeal swab, presence of viral mutation(s) within the areas targeted by this assay, and inadequate number of viral copies(<138 copies/mL). A negative result must be combined with clinical observations, patient history, and epidemiological information. The expected result is Negative.  Fact Sheet for Patients:  BloggerCourse.com  Fact Sheet for Healthcare Providers:  SeriousBroker.it  This test is no t yet approved or cleared by the [place] FDA and  has been authorized for detection and/or diagnosis of SARS-CoV-2 by FDA under an Emergency Use Authorization (EUA). This EUA will remain  in effect (meaning this test can be used) for the duration of the COVID-19 declaration under Section 564(b)(1) of the Act, 21 U.S.C.section 360bbb-3(b)(1), unless the authorization is terminated  or revoked sooner.       Influenza A by PCR NEGATIVE NEGATIVE Final   Influenza B by PCR NEGATIVE NEGATIVE Final    Comment: (NOTE) The Xpert Xpress SARS-CoV-2/FLU/RSV plus assay is intended as an aid in the diagnosis of influenza from Nasopharyngeal swab specimens and should not be used as a sole basis for treatment. Nasal washings and aspirates are unacceptable for Xpert Xpress SARS-CoV-2/FLU/RSV testing.  Fact Sheet for Patients: BloggerCourse.com  Fact Sheet for Healthcare Providers: SeriousBroker.it  This test is not yet approved or cleared by the [place] FDA and has been authorized for detection and/or diagnosis of SARS-CoV-2 by FDA under an Emergency Use Authorization (EUA). This EUA will remain in effect (meaning this test can be used) for the duration of the COVID-19 declaration under Section 564(b)(1) of the Act, 21 U.S.C. section 360bbb-3(b)(1), unless the authorization is terminated or revoked.  Performed at [facility], [address], [place], [place] [address]      Labs: CBC: Recent Labs  Lab 12/08/20 2113 12/09/20 0507 12/13/20 2044 12/14/20 0335  WBC 5.7 6.2 6.7 7.8  NEUTROABS  --  3.5 4.0  --   HGB 11.3* 11.3* 12.4* 11.0*  HCT 33.0* 33.6* 37.3* 32.5*  MCV 92.4 92.6 94.2 92.9  PLT 248 238 265 241   Basic Metabolic Panel: Recent Labs  Lab 12/08/20 2113 12/09/20 0507 12/11/20 0305 12/13/20 2044 12/14/20 0335  NA 142 141 139 139 141  K 3.7 3.3* 3.8 3.6 3.7  CL 111 110 107 107 110  CO2 21*  GLUCOSE 99 142* 108* 115* 96  BUN 20 18 21* 23* 20  CREATININE 0.79 0.86 0.99 0.92 0.79  CALCIUM 9.2 9.1 9.1 9.2 8.9  MG  --  1.9  --   --   --    Liver Function Tests: Recent Labs  Lab 12/09/20 0507 12/13/20 2044  AST 13* 10*  ALT 11 12  ALKPHOS 54 68  BILITOT 0.4 0.4  PROT 6.3* 6.7  ALBUMIN 3.5 3.8   CBG: Recent Labs  Lab 12/08/20 2134  GLUCAP  102*    Time spent: 35 minutes  Signed:  Lynden Oxford  [facility]  12/15/2020 10:52 AM

## 2020-12-15 NOTE — Progress Notes (Signed)
Subjective: Had multiple episodes with twitching yesterday.  States her body hurts today.  Also asking about his pneumothorax.  ROS: negative except above  Examination  Vital signs in last 24 hours: Temp:  [97.9 F (36.6 C)-98.3 F (36.8 C)] 97.9 F (36.6 C) (06/17 0734) Pulse Rate:  [79-101] 80 (06/17 0734) Resp:  [15-20] 15 (06/17 0734) BP: (113-129)/(69-99) 122/83 (06/17 0734) SpO2:  [97 %-100 %] 100 % (06/17 0734)  General: lying in bed, not in apparent distress CVS: pulse-normal rate and rhythm RS: breathing comfortably, symmetric chest expansion Extremities: normal, spontaneously moving in bed Neuro: Awake, alert, cranial nerves II to XII appear grossly intact, spontaneously moving in bed  Basic Metabolic Panel: Recent Labs  Lab 12/08/20 2113 12/09/20 0507 12/11/20 0305 12/13/20 2044 12/14/20 0335  NA 142 141 139 139 141  K 3.7 3.3* 3.8 3.6 3.7  CL 111 110 107 107 110  CO2 24 22 24 26  21*  GLUCOSE 99 142* 108* 115* 96  BUN 20 18 21* 23* 20  CREATININE 0.79 0.86 0.99 0.92 0.79  CALCIUM 9.2 9.1 9.1 9.2 8.9  MG  --  1.9  --   --   --     CBC: Recent Labs  Lab 12/08/20 2113 12/09/20 0507 12/13/20 2044 12/14/20 0335  WBC 5.7 6.2 6.7 7.8  NEUTROABS  --  3.5 4.0  --   HGB 11.3* 11.3* 12.4* 11.0*  HCT 33.0* 33.6* 37.3* 32.5*  MCV 92.4 92.6 94.2 92.9  PLT 248 238 265 241     Coagulation Studies: No results for input(s): LABPROT, INR in the last 72 hours.  Imaging No new brain imaging overnight  ASSESSMENT AND PLAN: 45 year old incarcerated male with history of seizure-like episodes    Nonepileptic spells/pseudoseizures -LTM EEG captured events consistent with nonepileptic events.  CK within normal limits   Recommendations -Discussed with patient that the episodes we captured during this hospitalization are nonepileptic.  Patient states he has been told by 3 doctors in the past that he has epilepsy but is not sure if he is ever had an EEG done before.   I discussed with patient that it is possible to have epileptic and nonepileptic events.  However, in the absence of any EEG in the past, it is difficult to know with certainty if his previous episodes were epileptic or not epileptic.  Therefore, I would recommend continuing his current AEDs and follow-up outpatient for further work-up.  He may benefit from EMU monitoring for medication adjustment if needed -I discussed the importance of following up with a psychiatrist/therapist and consider cognitive behavioral therapy for nonepileptic spells.  However, patient does not appear to be accepting of the idea. -I also discussed with patient that he has been to the hospital multiple times for these episodes, has had multiple CT head and has received benzodiazepines repeatedly.  I expressed my concern that these repeated admissions and subsequent management is not necessary for nonepileptic spells and that psychiatrist/therapist will be able to help him improve/control these episodes. -Patient does not need to be brought in for seizure-like episodes unless has significant change in vital signs (heart rate more than 90, blood pressure more than 160/90, SPO2 less than 95), has visible injury due to these episodes. -Can consider psychiatry consult for conversion disorder vs malingering if patient continues to have these episodes to minimize return to hospital for these episodes -Management of rest of comorbidities per primary team -Patient is ready for discharge from neurology standpoint  I have spent a total of  45 minutes with the patient reviewing hospital notes,  test results, labs and examining the patient as well as establishing an assessment and plan that was discussed personally with the patient.  > 50% of time was spent in direct patient care.    Lindie Spruce Epilepsy [facility] For questions after 5pm please refer to AMION to reach the Neurologist on call

## 2020-12-15 NOTE — Discharge Instructions (Signed)
Per [place] DMV statutes, patients with seizures are not allowed to drive until  they have been seizure-free for six months. Use caution when using heavy equipment or power tools. Avoid working on ladders or at heights. Take showers instead of baths. Ensure the water temperature is not too high on the home water heater. Do not go swimming alone. When caring for infants or small children, sit down when holding, feeding, or changing them to minimize risk of injury to the child in the event you have a seizure.  Maintain good sleep hygiene. Avoid alcohol.  

## 2020-12-15 NOTE — Progress Notes (Signed)
LTM maintenance completed; no skin breakdown was seen. 

## 2020-12-18 ENCOUNTER — Ambulatory Visit: Payer: Self-pay | Admitting: Thoracic Surgery (Cardiothoracic Vascular Surgery)

## 2021-01-28 ENCOUNTER — Emergency Department (HOSPITAL_COMMUNITY)

## 2021-01-28 ENCOUNTER — Encounter (HOSPITAL_COMMUNITY): Payer: Self-pay

## 2021-01-28 ENCOUNTER — Emergency Department (HOSPITAL_COMMUNITY)
Admission: EM | Admit: 2021-01-28 | Discharge: 2021-01-28 | Disposition: A | Discharge status: Home / Self Care | Attending: Emergency Medicine | Admitting: Emergency Medicine

## 2021-01-28 ENCOUNTER — Other Ambulatory Visit: Payer: Self-pay

## 2021-01-28 DIAGNOSIS — R569 Unspecified convulsions: Secondary | ICD-10-CM | POA: Diagnosis present

## 2021-01-28 DIAGNOSIS — F17210 Nicotine dependence, cigarettes, uncomplicated: Secondary | ICD-10-CM | POA: Diagnosis not present

## 2021-01-28 DIAGNOSIS — G40909 Epilepsy, unspecified, not intractable, without status epilepticus: Secondary | ICD-10-CM | POA: Insufficient documentation

## 2021-01-28 DIAGNOSIS — F445 Conversion disorder with seizures or convulsions: Secondary | ICD-10-CM

## 2021-01-28 LAB — COMPREHENSIVE METABOLIC PANEL
ALT: 18 U/L (ref 0–44)
AST: 12 U/L — ABNORMAL LOW (ref 15–41)
Albumin: 4.4 g/dL (ref 3.5–5.0)
Alkaline Phosphatase: 61 U/L (ref 38–126)
Anion gap: 6 (ref 5–15)
BUN: 26 mg/dL — ABNORMAL HIGH (ref 6–20)
CO2: 23 mmol/L (ref 22–32)
Calcium: 9.2 mg/dL (ref 8.9–10.3)
Chloride: 110 mmol/L (ref 98–111)
Creatinine, Ser: 0.96 mg/dL (ref 0.61–1.24)
GFR, Estimated: >60 mL/min (ref >60)
Glucose, Bld: 96 mg/dL (ref 70–99)
Potassium: 3.9 mmol/L (ref 3.5–5.1)
Sodium: 139 mmol/L (ref 135–145)
Total Bilirubin: 0.5 mg/dL (ref 0.3–1.2)
Total Protein: 7 g/dL (ref 6.5–8.1)

## 2021-01-28 LAB — RAPID URINE DRUG SCREEN, HOSP PERFORMED
Amphetamines: NOT DETECTED
Barbiturates: NOT DETECTED
Benzodiazepines: POSITIVE — AB
Cocaine: NOT DETECTED
Opiates: NOT DETECTED
Tetrahydrocannabinol: NOT DETECTED

## 2021-01-28 LAB — CBC
HCT: 39.5 % (ref 39.0–52.0)
Hemoglobin: 13.5 g/dL (ref 13.0–17.0)
MCH: 32.5 pg (ref 26.0–34.0)
MCHC: 34.2 g/dL (ref 30.0–36.0)
MCV: 95 fL (ref 80.0–100.0)
Platelets: 172 10*3/uL (ref 150–400)
RBC: 4.16 MIL/uL — ABNORMAL LOW (ref 4.22–5.81)
RDW: 13 % (ref 11.5–15.5)
WBC: 5.5 10*3/uL (ref 4.0–10.5)
nRBC: 0 % (ref 0.0–0.2)

## 2021-01-28 LAB — ACETAMINOPHEN LEVEL: Acetaminophen (Tylenol), Serum: <10 ug/mL — ABNORMAL LOW (ref 10–30)

## 2021-01-28 LAB — SALICYLATE LEVEL: Salicylate Lvl: <7 mg/dL — ABNORMAL LOW (ref 7.0–30.0)

## 2021-01-28 LAB — CBG MONITORING, ED: Glucose-Capillary: 98 mg/dL (ref 70–99)

## 2021-01-28 LAB — VALPROIC ACID LEVEL: Valproic Acid Lvl: 53 ug/mL (ref 50.0–100.0)

## 2021-01-28 LAB — ETHANOL: Alcohol, Ethyl (B): <10 mg/dL (ref <10)

## 2021-01-28 MED ORDER — LEVETIRACETAM IN NACL 1500 MG/100ML IV SOLN
1500.0000 mg | Freq: Once | INTRAVENOUS | Status: AC
  Administered 2021-01-28: 1500 mg via INTRAVENOUS
  Filled 2021-01-28: qty 100

## 2021-01-28 MED ORDER — DIVALPROEX SODIUM 250 MG PO DR TAB
250.0000 mg | DELAYED_RELEASE_TABLET | Freq: Once | ORAL | Status: AC
  Administered 2021-01-28: 250 mg via ORAL
  Filled 2021-01-28: qty 1

## 2021-01-28 MED ORDER — ROCURONIUM BROMIDE 10 MG/ML (PF) SYRINGE
PREFILLED_SYRINGE | INTRAVENOUS | Status: AC
  Filled 2021-01-28: qty 10

## 2021-01-28 MED ORDER — ETOMIDATE 2 MG/ML IV SOLN
INTRAVENOUS | Status: AC
  Filled 2021-01-28: qty 10

## 2021-01-28 NOTE — ED Triage Notes (Signed)
[facility]. Started seizing at [employer]. Patient was brushing his teeth when it happened hit a metal sink and fell. Vomit found next to him. Patient has been unconscious since.  EMS gave 22G left forearm 10 midazolam IV 15 L Nonrebreather

## 2021-01-28 NOTE — ED Notes (Signed)
Patient becoming more alert. Eyes open, responding to questions.

## 2021-01-28 NOTE — ED Provider Notes (Signed)
[facility] Provider Note   CSN: [id number] Arrival date & time: 01/28/21  [id number]     History Chief Complaint  Patient presents with   Seizures    Ronald Butler is a 45 y.o. male.  The history is provided by the police and medical records.  Seizures Seizure activity on arrival: yes   Seizure type:  Grand mal Preceding symptoms comment:  None Initial focality:  None Episode characteristics: generalized shaking   Postictal symptoms: somnolence   Return to baseline: yes   Timing:  Clustered Number of seizures this episode:  6 Progression:  Unchanged Context: not alcohol withdrawal and not hydrocephalus   Recent head injury:  No recent head injuries PTA treatment:  Midazolam From jail with seizure like activity x 6.  EMS called and gave 10 mg of versed.      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    Past Surgical History:  Procedure Laterality Date   APPENDECTOMY     CHOLECYSTECTOMY     INTERCOSTAL NERVE BLOCK Right 10/30/2020   Procedure: INTERCOSTAL NERVE BLOCK;  Surgeon: Corliss SkainsLightfoot, Harrell O, MD;  Location: [facility] OR;  Service: Thoracic;  Laterality: Right;   VIDEO BRONCHOSCOPY WITH INSERTION OF INTERBRONCHIAL VALVE (IBV) N/A 11/28/2020   Procedure: VIDEO BRONCHOSCOPY WITH INSERTION OF INTERBRONCHIAL VALVE (IBV).;  Surgeon: Corliss SkainsLightfoot, Harrell O, MD;  [facility];  Service: Thoracic;  Laterality: N/A;   XI ROBOTIC ASSISTED THORACOSCOPY PLEURECTOMY Right 10/30/2020   Procedure: PLEURECTOMY;  Surgeon: Corliss SkainsLightfoot, Harrell O, MD;  Location: [facility] OR;  Service: Thoracic;  Laterality: Right;       Family History  Family history unknown: Yes    Social  History   Tobacco Use   Smoking status: Every Day    Packs/day: 2.00    Types: Cigarettes   Smokeless tobacco: Never  Vaping Use   Vaping Use: Never used  Substance Use Topics   Alcohol use: Not Currently    Comment: occassional   Drug use: Not Currently    Types: Marijuana    Home Medications Prior to Admission medications   Medication Sig Start Date End Date Taking? Authorizing Provider  acetaminophen (TYLENOL) 325 MG tablet Take 2 tablets (650 mg total) by mouth every 6 (six) hours as needed for mild pain (or Fever >/= 101). Patient taking differently: Take 650 mg by mouth in the morning and at bedtime. 12/11/20   Meredeth IdeLama, Gagan S, MD  albuterol (VENTOLIN HFA) 108 (90 Base) MCG/ACT inhaler Inhale 2 puffs into the lungs every 6 (six) hours as needed for wheezing or shortness of breath. 12/03/20   Zigmund DanielPowell, A Caldwell Jr., MD  divalproex (DEPAKOTE) 250 MG DR tablet Take 500-750 mg by mouth See admin instructions. Take 2 tablets (500 mg) by mouth every morning and 3 tablets (750 mg) at night    [provider]  folic acid (FOLVITE) 1 MG tablet Take 1 tablet (1 mg total) by mouth daily. 12/03/20 12/03/21  Zigmund DanielPowell, A Caldwell Jr., MD  Lacosamide 100 MG TABS Take 1 tablet (100 mg total) by mouth 2 (two) times daily. 12/11/20   Meredeth IdeLama, Gagan S, MD  methocarbamol (ROBAXIN) 500 MG tablet Take 1  tablet (500 mg total) by mouth every 6 (six) hours as needed for muscle spasms. 12/11/20   Meredeth Ide, MD  pantoprazole (PROTONIX) 20 MG tablet Take 40 mg by mouth every morning.    [provider]  zonisamide (ZONEGRAN) 100 MG capsule Take 1 capsule (100 mg total) by mouth at bedtime. 12/15/20   Rolly Salter, MD    Allergies    Patient has no known allergies.  Review of Systems   Review of Systems  Unable to perform ROS: Acuity of condition  Constitutional:  Negative for fever.  HENT:  Negative for facial swelling.   Eyes:  Negative for redness.  Cardiovascular:  Negative for leg  swelling.  Gastrointestinal:  Negative for vomiting.  Skin:  Negative for rash.  Neurological:  Positive for seizures.   Physical Exam Updated Vital Signs BP 122/89   Pulse 72   Temp 98.1 F (36.7 C) (Oral)   Resp 14   Ht 5\' 11"  (1.803 m)   Wt 65.6 kg   SpO2 99%   BMI 20.17 kg/m   Physical Exam Vitals and nursing note reviewed. Exam conducted with a chaperone present.  Constitutional:      Appearance: He is not diaphoretic.  HENT:     Head: Normocephalic and atraumatic.     Nose: Nose normal.  Eyes:     Conjunctiva/sclera: Conjunctivae normal.     Pupils: Pupils are equal, round, and reactive to light.  Cardiovascular:     Rate and Rhythm: Normal rate and regular rhythm.     Pulses: Normal pulses.     Heart sounds: Normal heart sounds.  Pulmonary:     Effort: Pulmonary effort is normal.     Breath sounds: Normal breath sounds.  Abdominal:     General: Abdomen is flat. Bowel sounds are normal.     Palpations: Abdomen is soft.     Tenderness: There is no abdominal tenderness. There is no guarding.  Musculoskeletal:        General: Normal range of motion.     Cervical back: Normal range of motion and neck supple.  Skin:    General: Skin is warm and dry.     Capillary Refill: Capillary refill takes less than 2 seconds.  Neurological:     Deep Tendon Reflexes: Reflexes normal.  Psychiatric:     Comments: Sleeping     ED Results / Procedures / Treatments   Labs (all labs ordered are listed, but only abnormal results are displayed) Results for orders placed or performed during the hospital encounter of 01/28/21  Comprehensive metabolic panel  Result Value Ref Range   Sodium 139 135 - 145 mmol/L   Potassium 3.9 3.5 - 5.1 mmol/L   Chloride 110 98 - 111 mmol/L   CO2 23 22 - 32 mmol/L   Glucose, Bld 96 70 - 99 mg/dL   BUN 26 (H) 6 - 20 mg/dL   Creatinine, Ser 01/30/21 0.61 - 1.24 mg/dL   Calcium 9.2 8.9 - 3.00 mg/dL   Total Protein 7.0 6.5 - 8.1 g/dL   Albumin 4.4  3.5 - 5.0 g/dL   AST 12 (L) 15 - 41 U/L   ALT 18 0 - 44 U/L   Alkaline Phosphatase 61 38 - 126 U/L   Total Bilirubin 0.5 0.3 - 1.2 mg/dL   GFR, Estimated 76.2 >26 mL/min   Anion gap 6 5 - 15  Ethanol  Result Value Ref Range   Alcohol, Ethyl (B) <10 <  10 mg/dL  Salicylate level  Result Value Ref Range   Salicylate Lvl <7.0 (L) 7.0 - 30.0 mg/dL  Acetaminophen level  Result Value Ref Range   Acetaminophen (Tylenol), Serum <10 (L) 10 - 30 ug/mL  cbc  Result Value Ref Range   WBC 5.5 4.0 - 10.5 K/uL   RBC 4.16 (L) 4.22 - 5.81 MIL/uL   Hemoglobin 13.5 13.0 - 17.0 g/dL   HCT 09.3 81.8 - 29.9 %   MCV 95.0 80.0 - 100.0 fL   MCH 32.5 26.0 - 34.0 pg   MCHC 34.2 30.0 - 36.0 g/dL   RDW 37.1 69.6 - 78.9 %   Platelets 172 150 - 400 K/uL   nRBC 0.0 0.0 - 0.2 %  Rapid urine drug screen (hospital performed)  Result Value Ref Range   Opiates NONE DETECTED NONE DETECTED   Cocaine NONE DETECTED NONE DETECTED   Benzodiazepines POSITIVE (A) NONE DETECTED   Amphetamines NONE DETECTED NONE DETECTED   Tetrahydrocannabinol NONE DETECTED NONE DETECTED   Barbiturates NONE DETECTED NONE DETECTED  Valproic acid level  Result Value Ref Range   Valproic Acid Lvl 53 50.0 - 100.0 ug/mL  CBG monitoring, ED  Result Value Ref Range   Glucose-Capillary 98 70 - 99 mg/dL   No results found.  EKG EKG Interpretation  Date/Time:  Sunday January 28 2021 03:21:50 EDT Ventricular Rate:  79 PR Interval:  139 QRS Duration: 100 QT Interval:  381 QTC Calculation: 437 R Axis:   88 Text Interpretation: Sinus rhythm Confirmed by Nicanor Alcon, Raianna Slight ([id number]) on 01/28/2021 5:01:00 AM  Radiology No results found.  Procedures Procedures   Medications Ordered in ED Medications  levETIRAcetam (KEPPRA) IVPB 1500 mg/ 100 mL premix (0 mg Intravenous Stopped 01/28/21 0348)  divalproex (DEPAKOTE) DR tablet 250 mg (250 mg Oral Given 01/28/21 7510)     ED Course  I have reviewed the triage vital signs and the nursing  notes.  Pertinent labs & imaging results that were available during my care of the patient were reviewed by me and considered in my medical decision making (see chart for details).   Seen immediately by Dr. Wilford Corner, who knew patient from previous admission.  Has had long term monitoring that did not demonstrate epilepsy.  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  Patient is now awake.  If patient stays incident free, may be discharged back to police care.    No further episodes.  Stable for discharge with close follow up.    Ronald Butler was evaluated in Emergency Department on 01/28/2021 for the symptoms described in the history of present illness. He was evaluated in the context of the global COVID-19 pandemic, which necessitated consideration that the patient might be at risk for infection with the SARS-CoV-2 virus that causes COVID-19. Institutional protocols and algorithms that pertain to the evaluation of patients at risk for COVID-19 are in a state of rapid change based on information released by regulatory bodies including the CDC and federal and state organizations. These policies and algorithms were followed during the patient's care in the ED.     Final Clinical Impression(s) / ED Diagnoses Final diagnoses:  Seizure-like activity (HCC)   Return for intractable cough, coughing up blood, fevers > 100.4 unrelieved by medication, shortness of breath, intractable vomiting, chest pain, shortness of breath, weakness, numbness, changes in speech, facial asymmetry, abdominal pain, passing out, Inability to tolerate liquids or food, cough, altered mental status or any concerns. No signs of systemic illness or infection. The  patient is nontoxic-appearing on exam and vital signs are within normal limits. I have reviewed the triage vital signs and the nursing notes. Pertinent labs & imaging results that were available during my care of the patient were reviewed by me and considered in my medical decision  making (see chart for details). After history, exam, and medical workup I feel the patient has been appropriately medically screened and is safe for discharge home. Pertinent diagnoses were discussed with the patient. Patient was given return precautions   Rx / DC Orders ED Discharge Orders     None        Ariday Brinker, MD 01/28/21 6213

## 2021-01-28 NOTE — Consult Note (Addendum)
Neurology Consultation  Reason for Consult: Seizures Referring Physician: Dr. Daun Peacock  CC: Seizures  History is obtained from: Chart, patient  HPI: Ronald Butler is a 45 y.o. 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 He continued to have seizure-like activity in the ER and appeared confused.  The ED provider called me saying that this patient might need transfer for status epilepticus but I had her hold off intubation since we know this patient and we have seen multiple times for nonepileptic events. I got to the vascular hospital ER within about 20 minutes of receiving the phone call and he was coming around, urinating in a urinal at the bedside and answering questions appropriately. He reports that his medications have been given to him sometimes but sometimes have been missed.  He could not tell me why that was.  He did not come in with any medication administration record from the facility for me to confirm. During his last admission, he was seen by our epileptologist who evaluated him in detail with LTM EEG and captured multiple events on the EEG which were nonepileptic.  She discussed the importance of having a therapist. During his past examinations, he has in spite of being awake not cooperative with examination raising concern for conversion disorder versus malingering.  He was given a load of Keppra in the emergency room.  ROS: Full ROS was performed and is negative except as noted in the HPI.   Past Medical History:  Diagnosis Date   Pancreatitis    Pneumothorax    Seizures (HCC)         Family History  Family history unknown: Yes     Social History:   reports that he has been smoking cigarettes. He has been smoking an  average of 2 packs per day. He has never used smokeless tobacco. He reports previous alcohol use. He reports previous drug use. Drug: Marijuana.  Medications  Current Facility-Administered Medications:    etomidate (AMIDATE) 2 MG/ML injection, , , ,    rocuronium bromide 100 MG/10ML SOSY, , , ,   Current Outpatient Medications:    acetaminophen (TYLENOL) 325 MG tablet, Take 2 tablets (650 mg total) by mouth every 6 (six) hours as needed for mild pain (or Fever >/= 101). (Patient taking differently: Take 650 mg by mouth in the morning and at bedtime.), Disp: , Rfl:    albuterol (VENTOLIN HFA) 108 (90 Base) MCG/ACT inhaler, Inhale 2 puffs into the lungs every 6 (six) hours as needed for wheezing or shortness of breath., Disp: 8 g, Rfl: 2   divalproex (DEPAKOTE) 250 MG DR tablet, Take 500-750 mg by mouth See admin instructions. Take 2 tablets (500 mg) by mouth every morning and 3 tablets (750 mg) at night, Disp: , Rfl:    folic acid (FOLVITE) 1 MG tablet, Take 1 tablet (1 mg total) by mouth daily., Disp: 30 tablet, Rfl: 1   Lacosamide 100 MG TABS, Take 1 tablet (100 mg total) by mouth 2 (two) times daily., Disp: 60 tablet, Rfl: 1   methocarbamol (ROBAXIN) 500 MG tablet, Take 1 tablet (500 mg total) by mouth every 6 (six) hours as needed for muscle spasms., Disp: 30 tablet, Rfl: 1   pantoprazole (PROTONIX) 20 MG tablet, Take 40 mg by  mouth every morning., Disp: , Rfl:    zonisamide (ZONEGRAN) 100 MG capsule, Take 1 capsule (100 mg total) by mouth at bedtime., Disp: 30 capsule, Rfl: 0   Exam: Current vital signs: BP 121/89   Pulse 72   Temp 98.1 F (36.7 C) (Oral)   Resp 16   Ht 5\' 11"  (1.803 m)   Wt 65.6 kg   SpO2 98%   BMI 20.17 kg/m  Vital signs in last 24 hours: Temp:  [98.1 F (36.7 C)] 98.1 F (36.7 C) (07/31 0326) Pulse Rate:  [72-85] 72 (07/31 0410) Resp:  [16-21] 16 (07/31 0410) BP: (106-121)/(72-89) 121/89 (07/31 0410) SpO2:  [98 %-100 %] 98 % (07/31 0410) Weight:  [65.6  kg] 65.6 kg (07/31 0319) General: Awake alert in no distress HEENT: Small abrasion over the left forehead CVs: Regular rhythm Respiratory: Breathing well saturating normally on room air Abdomen nondistended nontender Extremities warm well perfused Neurological exam Is awake alert oriented x3 Speech is nondysarthric No evidence of aphasia Slightly diminished attention concentration Cranial nerves II to XII intact Motor examination with no deficits-5/5 strength all over Sensation intact light touch Coordination with no dysmetria Gait testing deferred as he was in lower extremity cuffs with the sheriff deputy present at the bedside.  Labs I have reviewed labs in epic and the results pertinent to this consultation are:  CBC    Component Value Date/Time   WBC 5.5 01/28/2021 0326   RBC 4.16 (L) 01/28/2021 0326   HGB 13.5 01/28/2021 0326   HCT 39.5 01/28/2021 0326   PLT 172 01/28/2021 0326   MCV 95.0 01/28/2021 0326   MCH 32.5 01/28/2021 0326   MCHC 34.2 01/28/2021 0326   RDW 13.0 01/28/2021 0326   LYMPHSABS 1.9 12/13/2020 2044   MONOABS 0.6 12/13/2020 2044   EOSABS 0.1 12/13/2020 2044   BASOSABS 0.1 12/13/2020 2044    CMP     Component Value Date/Time   NA 139 01/28/2021 0326   K 3.9 01/28/2021 0326   CL 110 01/28/2021 0326   CO2 23 01/28/2021 0326   GLUCOSE 96 01/28/2021 0326   BUN 26 (H) 01/28/2021 0326   CREATININE 0.96 01/28/2021 0326   CALCIUM 9.2 01/28/2021 0326   PROT 7.0 01/28/2021 0326   ALBUMIN 4.4 01/28/2021 0326   AST 12 (L) 01/28/2021 0326   ALT 18 01/28/2021 0326   ALKPHOS 61 01/28/2021 0326   BILITOT 0.5 01/28/2021 0326   GFRNONAA >60 01/28/2021 0326   GFRAA >60 04/05/2018 1417  CBC and BMP reassuring  Imaging I have reviewed the images obtained: CT-head most recently from 11/2020-normal MRI and MRA of the head from 03/2020-normal.  Assessment: 45 year old man, who is currently incarcerated, with a history of seizure-like episodes with strong  suspicion for nonepileptic events and multiple admissions for breakthrough seizure-like activity episodes and LTM EEG consistent with nonepileptic spells presenting with a flurry of seizure-like activity in jail. Reports that medications not being administered to him regularly. I have strong suspicion for these events being nonepileptic versus breakthrough seizures in the setting of noncompliance although there is a stronger suspicion for nonepileptic events, nearly half for over half the patients with nonepileptic events do have underlying epilepsy. Since he has been on 3 antiepileptics for an extended duration of time, I would be hesitant to make any changes right away.  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 If low, can be supplemented with IV Depakote Continue home Lamictal and zonisamide doses  Follow-up with outpatient neurology He would benefit from a psychiatry consultation I do not see a need for repeating head imaging at this time given his stereotypical presentation with nonepileptic spells and possible malingering/conversion. If, he continues to have breakthrough activity of his spells and for some reason the primary team deems appropriate that he needs admission, he should be admitted to [facility] so that he can be on long-term EEG to validate the above diagnosis of psychogenic spells or capture true spells if they truly exist. Plan discussed in detail with Dr. Daun Peacock He got a load of Keppra in the ER-do not need to continue Keppra. Please call back with questions as needed.  -- Milon Dikes, MD Neurologist [facility] Pager: [phone]

## 2021-02-07 ENCOUNTER — Other Ambulatory Visit: Payer: Self-pay | Admitting: Thoracic Surgery (Cardiothoracic Vascular Surgery)

## 2021-02-07 DIAGNOSIS — J939 Pneumothorax, unspecified: Secondary | ICD-10-CM

## 2021-02-09 ENCOUNTER — Ambulatory Visit
Admission: RE | Admit: 2021-02-09 | Discharge: 2021-02-09 | Disposition: A | Discharge status: Home / Self Care | Source: Ambulatory Visit | Attending: Thoracic Surgery (Cardiothoracic Vascular Surgery) | Admitting: Thoracic Surgery (Cardiothoracic Vascular Surgery)

## 2021-02-09 ENCOUNTER — Ambulatory Visit (INDEPENDENT_AMBULATORY_CARE_PROVIDER_SITE_OTHER): Payer: Self-pay | Admitting: Physician Assistant

## 2021-02-09 ENCOUNTER — Other Ambulatory Visit: Payer: Self-pay

## 2021-02-09 ENCOUNTER — Ambulatory Visit: Payer: Self-pay | Admitting: Thoracic Surgery (Cardiothoracic Vascular Surgery)

## 2021-02-09 VITALS — BP 111/81 | HR 80 | Resp 20 | Ht 71.0 in | Wt 157.0 lb

## 2021-02-09 DIAGNOSIS — J939 Pneumothorax, unspecified: Secondary | ICD-10-CM

## 2021-02-09 DIAGNOSIS — Z9889 Other specified postprocedural states: Secondary | ICD-10-CM

## 2021-02-09 NOTE — Progress Notes (Signed)
[address]       Ronald Butler [address]             [phone]    Ronald Butler is a 45 y.o. male patient who is s/p robotic assisted thoracoscopy with wedge resection and lysis of adhesions, and subsequently had a prolonged air leak. He then had endobronchial valves placed x 7 on 11/28/2020. He missed his f/u appointment in June due to a hospitalization for seizure-like activity. He was started on Keppra and Zonegran (at bedtime) in addition to his Depakote.  Today, the patient presents for f/u and to scheduled removal of endobronchial valve. He has some right sided chest pain. Sore to palpation over the ribs. Incisions healing well. The patient states he had been doing a lot of push-ups to put on some muscle since he lost a lot of weight in the hospital. His right-sided rib pain appears musculoskeletal so I suggested resting until it resolves.    1. S/P robot-assisted surgical procedure    Past Medical History:  Diagnosis Date   Pancreatitis    Pneumothorax    Seizures (HCC)    No past surgical history pertinent negatives on file. Scheduled Meds: Current Outpatient Medications on File Prior to Visit  Medication Sig Dispense Refill   acetaminophen (TYLENOL) 325 MG tablet Take 2 tablets (650 mg total) by mouth every 6 (six) hours as needed for mild pain (or Fever >/= 101). (Patient taking differently: Take 650 mg by mouth in the morning and at bedtime.)     albuterol (VENTOLIN HFA) 108 (90 Base) MCG/ACT inhaler Inhale 2 puffs into the lungs every 6 (six) hours as needed for wheezing or shortness of breath. 8 g 2   divalproex (DEPAKOTE) 250 MG DR tablet Take 500-750 mg by mouth See admin instructions. Take 2 tablets (500 mg) by mouth every morning and 3 tablets (750 mg) at night     folic acid (FOLVITE) 1 MG tablet Take 1 tablet (1 mg total) by mouth daily. 30 tablet 1   Lacosamide 100 MG TABS Take 1 tablet (100 mg total) by mouth 2 (two) times daily. 60 tablet 1    methocarbamol (ROBAXIN) 500 MG tablet Take 1 tablet (500 mg total) by mouth every 6 (six) hours as needed for muscle spasms. (Patient not taking: Reported on 02/09/2021) 30 tablet 1   pantoprazole (PROTONIX) 20 MG tablet Take 40 mg by mouth every morning. (Patient not taking: Reported on 02/09/2021)     zonisamide (ZONEGRAN) 100 MG capsule Take 1 capsule (100 mg total) by mouth at bedtime. (Patient not taking: Reported on 02/09/2021) 30 capsule 0   No current facility-administered medications on file prior to visit.     No Known Allergies Active Problems:   * No active hospital problems. *  Blood pressure 111/81, pulse 80, resp. rate 20, height 5\' 11"  (1.803 m), weight 157 lb (71.2 kg), SpO2 98 %.  Cor: NSR, no murmur Pulm: CTA bilaterally and in all fields Abd: no tenderness Wound: healed Ext: no edema  CLINICAL DATA:  Pneumothorax. History of right lung surgery 10/30/2020   EXAM: CHEST - 2 VIEW   COMPARISON:  12/08/2020   FINDINGS: Multiple endobronchial valves right upper lobe and right lower lobe bronchi unchanged. Negative for pneumothorax. Lungs clear without infiltrate or effusion. Heart size and vascularity normal.   IMPRESSION: Lungs are clear.  Negative for pneumothorax.     Electronically Signed   By: 02/07/2021  M.D.   On: 02/09/2021 10:32  Subjective Objective: Vital signs (most recent): Blood pressure 111/81, pulse 80, resp. rate 20, height 5\' 11"  (1.803 m), weight 157 lb (71.2 kg), SpO2 98 %. Assessment & Plan  Spontaneous pneumo with complicated hospital course due to prolonged air leak and endobronchial valve placement on 5/31. His CXR was negative for a pneumothorax, pleural effusion, or infection.  Right sided rib pain-likely musculoskeletal. Incisions are healing well. He still has some superficial numbness around the incisions. He is instructed to rest until this resolves. Cough- this has been non-productive. Likely left over from a viral  infection. Encouraged fluids and keeping mouth lubricated. If it doesn't resolve, may need to see primary care.  Wheezing- none on exam today. Encouraged to use his inhalers as needed  Plan: The lung is structurally normal with no pneumothorax, pleural effusion, or signs of infection. Incisions are well healed. He needs to come back to the hospital for his endobronchial valve removal. We will discuss with Dr. 6/31 and get him scheduled as soon as possible.   Cliffton Asters 02/09/2021

## 2021-02-12 ENCOUNTER — Encounter: Payer: Self-pay | Admitting: *Deleted

## 2021-02-12 ENCOUNTER — Other Ambulatory Visit: Payer: Self-pay | Admitting: *Deleted

## 2021-02-12 DIAGNOSIS — Z9889 Other specified postprocedural states: Secondary | ICD-10-CM

## 2021-02-12 DIAGNOSIS — J9382 Other air leak: Secondary | ICD-10-CM

## 2021-02-26 NOTE — Pre-Procedure Instructions (Signed)
Surgical Instructions    Your procedure is scheduled on Thursday 03/01/21.   Report to [facility] Main Entrance "A" at 06:30 A.M., then check in with the Admitting office.  Call this number if you have problems the morning of surgery:  [phone]   If you have any questions prior to your surgery date call [phone]: Open Monday-Friday 8am-4pm    Remember:  Do not eat or drink after midnight the night before your surgery   Take these medicines the morning of surgery with A SIP OF WATER   divalproex (DEPAKOTE)  Lacosamide  albuterol (VENTOLIN HFA) 108 (90 Base)- If needed     As of today, STOP taking any Aspirin (unless otherwise instructed by your surgeon) Aleve, Naproxen, Ibuprofen, Motrin, Advil, Goody's, BC's, all herbal medications, fish oil, and all vitamins.          Do not wear jewelry or makeup Do not wear lotions, powders, perfumes/colognes, or deodorant. Do not shave 48 hours prior to surgery.  Men may shave face and neck. Do not bring valuables to the hospital. DO Not wear nail polish, gel polish, artificial nails, or any other type of covering on natural nails including finger and toenails. If patients have artificial nails, gel coating, etc. that need to be removed by a nail salon please have this removed prior to surgery or surgery may need to be canceled/delayed if the surgeon/ anesthesia feels like the patient is unable to be adequately monitored.             [facility] is not responsible for any belongings or valuables.  Do NOT Smoke (Tobacco/Vaping) or drink Alcohol 24 hours prior to your procedure If you use a CPAP at night, you may bring all equipment for your overnight stay.   Contacts, glasses, dentures or bridgework may not be worn into surgery, please bring cases for these belongings   For patients admitted to the hospital, discharge time will be determined by your treatment team.   Patients discharged the day of surgery will not be allowed to drive  home, and someone needs to stay with them for 24 hours.  ONLY 1 SUPPORT PERSON MAY BE PRESENT WHILE YOU ARE IN SURGERY. IF YOU ARE TO BE ADMITTED ONCE YOU ARE IN YOUR ROOM YOU WILL BE ALLOWED TWO (2) VISITORS.  Minor children may have two parents present. Special consideration for safety and communication needs will be reviewed on a case by case basis.  Special instructions:    Oral Hygiene is also important to reduce your risk of infection.  Remember - BRUSH YOUR TEETH THE MORNING OF SURGERY WITH YOUR REGULAR TOOTHPASTE   [place]- Preparing For Surgery  Before surgery, you can play an important role. Because skin is not sterile, your skin needs to be as free of germs as possible. You can reduce the number of germs on your skin by washing with CHG (chlorahexidine gluconate) Soap before surgery.  CHG is an antiseptic cleaner which kills germs and bonds with the skin to continue killing germs even after washing.     Please do not use if you have an allergy to CHG or antibacterial soaps. If your skin becomes reddened/irritated stop using the CHG.  Do not shave (including legs and underarms) for at least 48 hours prior to first CHG shower. It is OK to shave your face.  Please follow these instructions carefully.     Shower the NIGHT BEFORE SURGERY and the MORNING OF SURGERY with CHG Soap.  If you chose to wash your hair, wash your hair first as usual with your normal shampoo. After you shampoo, rinse your hair and body thoroughly to remove the shampoo.  Then [employer] and genitals (private parts) with your normal soap and rinse thoroughly to remove soap.  After that Use CHG Soap as you would any other liquid soap. You can apply CHG directly to the skin and wash gently with a scrungie or a clean washcloth.   Apply the CHG Soap to your body ONLY FROM THE NECK DOWN.  Do not use on open wounds or open sores. Avoid contact with your eyes, ears, mouth and genitals (private parts). Wash Face and  genitals (private parts)  with your normal soap.   Wash thoroughly, paying special attention to the area where your surgery will be performed.  Thoroughly rinse your body with warm water from the neck down.  DO NOT shower/wash with your normal soap after using and rinsing off the CHG Soap.  Pat yourself dry with a CLEAN TOWEL.  Wear CLEAN PAJAMAS to bed the night before surgery  Place CLEAN SHEETS on your bed the night before your surgery  DO NOT SLEEP WITH PETS.   Day of Surgery:  Take a shower with CHG soap. Wear Clean/Comfortable clothing the morning of surgery Do not apply any deodorants/lotions.   Remember to brush your teeth WITH YOUR REGULAR TOOTHPASTE.   Please read over the following fact sheets that you were given.

## 2021-02-27 ENCOUNTER — Ambulatory Visit (HOSPITAL_COMMUNITY)
Admission: RE | Admit: 2021-02-27 | Discharge: 2021-02-27 | Disposition: A | Discharge status: Home / Self Care | Source: Ambulatory Visit | Attending: Thoracic Surgery (Cardiothoracic Vascular Surgery) | Admitting: Thoracic Surgery (Cardiothoracic Vascular Surgery)

## 2021-02-27 ENCOUNTER — Other Ambulatory Visit: Payer: Self-pay

## 2021-02-27 ENCOUNTER — Encounter (HOSPITAL_COMMUNITY)
Admission: RE | Admit: 2021-02-27 | Discharge: 2021-02-27 | Disposition: A | Discharge status: Home / Self Care | Source: Ambulatory Visit | Attending: Thoracic Surgery (Cardiothoracic Vascular Surgery) | Admitting: Thoracic Surgery (Cardiothoracic Vascular Surgery)

## 2021-02-27 ENCOUNTER — Encounter (HOSPITAL_COMMUNITY): Payer: Self-pay

## 2021-02-27 DIAGNOSIS — J9382 Other air leak: Secondary | ICD-10-CM | POA: Insufficient documentation

## 2021-02-27 DIAGNOSIS — Z9889 Other specified postprocedural states: Secondary | ICD-10-CM | POA: Insufficient documentation

## 2021-02-27 HISTORY — DX: Unspecified osteoarthritis, unspecified site: M19.90

## 2021-02-27 HISTORY — DX: Gastro-esophageal reflux disease without esophagitis: K21.9

## 2021-02-27 HISTORY — DX: Headache, unspecified: R51.9

## 2021-02-27 HISTORY — DX: Dyspnea, unspecified: R06.00

## 2021-02-27 LAB — CBC
HCT: 43.8 % (ref 39.0–52.0)
Hemoglobin: 15.3 g/dL (ref 13.0–17.0)
MCH: 33 pg (ref 26.0–34.0)
MCHC: 34.9 g/dL (ref 30.0–36.0)
MCV: 94.4 fL (ref 80.0–100.0)
Platelets: 194 10*3/uL (ref 150–400)
RBC: 4.64 MIL/uL (ref 4.22–5.81)
RDW: 12.2 % (ref 11.5–15.5)
WBC: 4.7 10*3/uL (ref 4.0–10.5)
nRBC: 0 % (ref 0.0–0.2)

## 2021-02-27 LAB — COMPREHENSIVE METABOLIC PANEL
ALT: 12 U/L (ref 0–44)
AST: 13 U/L — ABNORMAL LOW (ref 15–41)
Albumin: 4.1 g/dL (ref 3.5–5.0)
Alkaline Phosphatase: 59 U/L (ref 38–126)
Anion gap: 8 (ref 5–15)
BUN: 22 mg/dL — ABNORMAL HIGH (ref 6–20)
CO2: 23 mmol/L (ref 22–32)
Calcium: 9.3 mg/dL (ref 8.9–10.3)
Chloride: 108 mmol/L (ref 98–111)
Creatinine, Ser: 0.99 mg/dL (ref 0.61–1.24)
GFR, Estimated: >60 mL/min (ref >60)
Glucose, Bld: 97 mg/dL (ref 70–99)
Potassium: 4 mmol/L (ref 3.5–5.1)
Sodium: 139 mmol/L (ref 135–145)
Total Bilirubin: 0.6 mg/dL (ref 0.3–1.2)
Total Protein: 6.9 g/dL (ref 6.5–8.1)

## 2021-02-27 LAB — APTT: aPTT: 31 seconds (ref 24–36)

## 2021-02-27 LAB — SURGICAL PCR SCREEN
MRSA, PCR: NEGATIVE
Staphylococcus aureus: NEGATIVE

## 2021-02-27 LAB — PROTIME-INR
INR: 1.1 (ref 0.8–1.2)
Prothrombin Time: 14.2 seconds (ref 11.4–15.2)

## 2021-02-27 NOTE — Progress Notes (Signed)
PCP: [facility] Cardiologist: denies  EKG: na CXR: 02/27/21 ECHO: 04/27/20 Stress Test: denies Cardiac Cath: denies  Fasting Blood Sugar- na Checks Blood Sugar__na_ times a day  ASA/Blood Thinner: No  OSA/CPAP: No  Covid test not needed  Anesthesia Review: No  Patient denies shortness of breath, fever, cough, and chest pain at PAT appointment.  Patient verbalized understanding of instructions provided today at the PAT appointment.  Patient asked to review instructions at home and day of surgery.

## 2021-03-01 ENCOUNTER — Ambulatory Visit (HOSPITAL_COMMUNITY)
Admission: RE | Admit: 2021-03-01 | Discharge: 2021-03-01 | Disposition: A | Discharge status: Home / Self Care | Attending: Thoracic Surgery (Cardiothoracic Vascular Surgery) | Admitting: Thoracic Surgery (Cardiothoracic Vascular Surgery)

## 2021-03-01 ENCOUNTER — Encounter (HOSPITAL_COMMUNITY)
Admission: RE | Disposition: A | Discharge status: Home / Self Care | Payer: Self-pay | Source: Home / Self Care | Attending: Thoracic Surgery (Cardiothoracic Vascular Surgery)

## 2021-03-01 ENCOUNTER — Ambulatory Visit (HOSPITAL_COMMUNITY): Admitting: Registered Nurse

## 2021-03-01 ENCOUNTER — Other Ambulatory Visit: Payer: Self-pay

## 2021-03-01 ENCOUNTER — Encounter (HOSPITAL_COMMUNITY): Payer: Self-pay | Admitting: Thoracic Surgery (Cardiothoracic Vascular Surgery)

## 2021-03-01 DIAGNOSIS — Z8709 Personal history of other diseases of the respiratory system: Secondary | ICD-10-CM | POA: Diagnosis not present

## 2021-03-01 DIAGNOSIS — Z9889 Other specified postprocedural states: Secondary | ICD-10-CM

## 2021-03-01 DIAGNOSIS — F17210 Nicotine dependence, cigarettes, uncomplicated: Secondary | ICD-10-CM | POA: Diagnosis not present

## 2021-03-01 DIAGNOSIS — Z4589 Encounter for adjustment and management of other implanted devices: Secondary | ICD-10-CM | POA: Insufficient documentation

## 2021-03-01 DIAGNOSIS — Z20822 Contact with and (suspected) exposure to covid-19: Secondary | ICD-10-CM | POA: Diagnosis not present

## 2021-03-01 DIAGNOSIS — J939 Pneumothorax, unspecified: Secondary | ICD-10-CM | POA: Diagnosis not present

## 2021-03-01 DIAGNOSIS — J9382 Other air leak: Secondary | ICD-10-CM

## 2021-03-01 HISTORY — PX: VIDEO BRONCHOSCOPY WITH INSERTION OF INTERBRONCHIAL VALVE (IBV): SHX6178

## 2021-03-01 LAB — SARS CORONAVIRUS 2 BY RT PCR (HOSPITAL ORDER, PERFORMED IN ~~LOC~~ HOSPITAL LAB): SARS Coronavirus 2: NEGATIVE

## 2021-03-01 SURGERY — BRONCHOSCOPY, FLEXIBLE, WITH INTRABRONCHIAL VALVE INSERTION
Anesthesia: General

## 2021-03-01 MED ORDER — FENTANYL CITRATE (PF) 100 MCG/2ML IJ SOLN
INTRAMUSCULAR | Status: AC
  Filled 2021-03-01: qty 2

## 2021-03-01 MED ORDER — ONDANSETRON HCL 4 MG/2ML IJ SOLN
INTRAMUSCULAR | Status: DC | PRN
  Administered 2021-03-01: 4 mg via INTRAVENOUS

## 2021-03-01 MED ORDER — EPINEPHRINE PF 1 MG/ML IJ SOLN
INTRAMUSCULAR | Status: AC
  Filled 2021-03-01: qty 1

## 2021-03-01 MED ORDER — PROMETHAZINE HCL 25 MG/ML IJ SOLN
6.2500 mg | Freq: Once | INTRAMUSCULAR | Status: AC
  Administered 2021-03-01: 6.25 mg via INTRAVENOUS

## 2021-03-01 MED ORDER — LACTATED RINGERS IV SOLN: INTRAVENOUS | Status: DC | PRN

## 2021-03-01 MED ORDER — FENTANYL CITRATE (PF) 250 MCG/5ML IJ SOLN
INTRAMUSCULAR | Status: DC | PRN
  Administered 2021-03-01: 150 ug via INTRAVENOUS
  Administered 2021-03-01: 100 ug via INTRAVENOUS

## 2021-03-01 MED ORDER — ACETAMINOPHEN 500 MG PO TABS: 1000.0000 mg | ORAL_TABLET | Freq: Once | ORAL | Status: DC | PRN

## 2021-03-01 MED ORDER — MIDAZOLAM HCL 2 MG/2ML IJ SOLN
INTRAMUSCULAR | Status: AC
  Filled 2021-03-01: qty 2

## 2021-03-01 MED ORDER — OXYCODONE HCL 5 MG/5ML PO SOLN: 5.0000 mg | Freq: Once | ORAL | Status: DC | PRN

## 2021-03-01 MED ORDER — PHENYLEPHRINE 40 MCG/ML (10ML) SYRINGE FOR IV PUSH (FOR BLOOD PRESSURE SUPPORT)
PREFILLED_SYRINGE | INTRAVENOUS | Status: DC | PRN
  Administered 2021-03-01 (×2): 80 ug via INTRAVENOUS

## 2021-03-01 MED ORDER — CHLORHEXIDINE GLUCONATE 0.12 % MT SOLN
OROMUCOSAL | Status: AC
  Administered 2021-03-01: 15 mL
  Filled 2021-03-01: qty 15

## 2021-03-01 MED ORDER — DEXMEDETOMIDINE (PRECEDEX) IN NS 20 MCG/5ML (4 MCG/ML) IV SYRINGE
PREFILLED_SYRINGE | INTRAVENOUS | Status: DC | PRN
  Administered 2021-03-01: 8 ug via INTRAVENOUS
  Administered 2021-03-01: 12 ug via INTRAVENOUS

## 2021-03-01 MED ORDER — OXYCODONE HCL 5 MG PO TABS: 5.0000 mg | ORAL_TABLET | Freq: Once | ORAL | Status: DC | PRN

## 2021-03-01 MED ORDER — ACETAMINOPHEN 160 MG/5ML PO SOLN: 1000.0000 mg | Freq: Once | ORAL | Status: DC | PRN

## 2021-03-01 MED ORDER — 0.9 % SODIUM CHLORIDE (POUR BTL) OPTIME
TOPICAL | Status: DC | PRN
  Administered 2021-03-01: 1000 mL

## 2021-03-01 MED ORDER — FENTANYL CITRATE (PF) 250 MCG/5ML IJ SOLN
INTRAMUSCULAR | Status: AC
  Filled 2021-03-01: qty 5

## 2021-03-01 MED ORDER — SUGAMMADEX SODIUM 200 MG/2ML IV SOLN
INTRAVENOUS | Status: DC | PRN
Start: 2021-03-01 — End: 2021-03-01
  Administered 2021-03-01: 300 mg via INTRAVENOUS

## 2021-03-01 MED ORDER — LIDOCAINE 2% (20 MG/ML) 5 ML SYRINGE
INTRAMUSCULAR | Status: DC | PRN
Start: 2021-03-01 — End: 2021-03-01
  Administered 2021-03-01: 100 mg via INTRAVENOUS

## 2021-03-01 MED ORDER — PROMETHAZINE HCL 25 MG/ML IJ SOLN
INTRAMUSCULAR | Status: AC
  Filled 2021-03-01: qty 1

## 2021-03-01 MED ORDER — ROCURONIUM BROMIDE 10 MG/ML (PF) SYRINGE
PREFILLED_SYRINGE | INTRAVENOUS | Status: DC | PRN
Start: 2021-03-01 — End: 2021-03-01
  Administered 2021-03-01: 60 mg via INTRAVENOUS
  Administered 2021-03-01: 20 mg via INTRAVENOUS

## 2021-03-01 MED ORDER — MIDAZOLAM HCL 2 MG/2ML IJ SOLN
INTRAMUSCULAR | Status: DC | PRN
  Administered 2021-03-01: 2 mg via INTRAVENOUS

## 2021-03-01 MED ORDER — ACETAMINOPHEN 10 MG/ML IV SOLN: 1000.0000 mg | Freq: Once | INTRAVENOUS | Status: DC | PRN

## 2021-03-01 MED ORDER — DEXAMETHASONE SODIUM PHOSPHATE 10 MG/ML IJ SOLN
INTRAMUSCULAR | Status: DC | PRN
  Administered 2021-03-01: 10 mg via INTRAVENOUS

## 2021-03-01 MED ORDER — FENTANYL CITRATE (PF) 100 MCG/2ML IJ SOLN
25.0000 ug | INTRAMUSCULAR | Status: DC | PRN
  Administered 2021-03-01: 50 ug via INTRAVENOUS
  Administered 2021-03-01: 25 ug via INTRAVENOUS

## 2021-03-01 MED ORDER — PROPOFOL 10 MG/ML IV BOLUS
INTRAVENOUS | Status: DC | PRN
  Administered 2021-03-01: 150 mg via INTRAVENOUS

## 2021-03-01 MED ORDER — PROPOFOL 10 MG/ML IV BOLUS
INTRAVENOUS | Status: AC
  Filled 2021-03-01: qty 20

## 2021-03-01 SURGICAL SUPPLY — 35 items
ADAPTER VALVE BIOPSY EBUS (MISCELLANEOUS) IMPLANT
ADPTR VALVE BIOPSY EBUS (MISCELLANEOUS)
BLADE CLIPPER SURG (BLADE) ×2 IMPLANT
CANISTER SUCT 3000ML PPV (MISCELLANEOUS) ×2 IMPLANT
CNTNR URN SCR LID CUP LEK RST (MISCELLANEOUS) ×1 IMPLANT
CONT SPEC 4OZ STRL OR WHT (MISCELLANEOUS) ×1
COVER BACK TABLE 60X90IN (DRAPES) ×2 IMPLANT
FILTER STRAW FLUID ASPIR (MISCELLANEOUS) IMPLANT
FORCEPS BIOP RJ4 1.8 (CUTTING FORCEPS) IMPLANT
FORCEPS RADIAL JAW LRG 4 PULM (INSTRUMENTS) ×1 IMPLANT
GAUZE SPONGE 4X4 12PLY STRL (GAUZE/BANDAGES/DRESSINGS) ×2 IMPLANT
GLOVE SURG ENC MOIS LTX SZ7 (GLOVE) ×2 IMPLANT
GLOVE SURG ENC MOIS LTX SZ7.5 (GLOVE) ×2 IMPLANT
GOWN STRL REUS W/ TWL XL LVL3 (GOWN DISPOSABLE) ×1 IMPLANT
GOWN STRL REUS W/TWL XL LVL3 (GOWN DISPOSABLE) ×1
KIT CLEAN ENDO COMPLIANCE (KITS) ×2 IMPLANT
KIT TURNOVER KIT B (KITS) ×2 IMPLANT
MARKER SKIN DUAL TIP RULER LAB (MISCELLANEOUS) ×2 IMPLANT
NS IRRIG 1000ML POUR BTL (IV SOLUTION) ×2 IMPLANT
OIL SILICONE PENTAX (PARTS (SERVICE/REPAIRS)) ×2 IMPLANT
PAD ARMBOARD 7.5X6 YLW CONV (MISCELLANEOUS) ×4 IMPLANT
RADIAL JAW LRG 4 PULMONARY (INSTRUMENTS) ×1
STOPCOCK MORSE 400PSI 3WAY (MISCELLANEOUS) ×2 IMPLANT
SYR 10ML LL (SYRINGE) ×2 IMPLANT
SYR 20ML ECCENTRIC (SYRINGE) ×2 IMPLANT
TOWEL GREEN STERILE (TOWEL DISPOSABLE) ×2 IMPLANT
TOWEL GREEN STERILE FF (TOWEL DISPOSABLE) ×2 IMPLANT
TOWEL NATURAL 4PK STERILE (DISPOSABLE) ×2 IMPLANT
TRAP SPECIMEN MUCUS 40CC (MISCELLANEOUS) ×2 IMPLANT
TUBE CONNECTING 20X1/4 (TUBING) ×2 IMPLANT
UNDERPAD 30X36 HEAVY ABSORB (UNDERPADS AND DIAPERS) ×2 IMPLANT
VALVE BIOPSY  SINGLE USE (MISCELLANEOUS) ×1
VALVE BIOPSY SINGLE USE (MISCELLANEOUS) ×1 IMPLANT
VALVE SUCTION BRONCHIO DISP (MISCELLANEOUS) ×2 IMPLANT
WATER STERILE IRR 1000ML POUR (IV SOLUTION) ×2 IMPLANT

## 2021-03-01 NOTE — H&P (Signed)
[address]       [place] [address]             [phone]                    [name] Medical Record #[id number] Date of Birth: December 31, 1975  Referring: No ref. provider found Primary Care: Patient, No Pcp Per (Inactive) Primary Cardiologist: None  Chief Complaint:   No chief complaint on file.   History of Present Illness:    Ronald Butler 45 y.o. male previously treated with endobronchial valves for a persistent pneumothorax presents for valve removal today.  He complains of some shallow breathing.    Past Medical History:  Diagnosis Date   Arthritis    Dyspnea    GERD (gastroesophageal reflux disease)    Headache    Pancreatitis    Pneumothorax    Seizures (HCC)     Past Surgical History:  Procedure Laterality Date   APPENDECTOMY     CHOLECYSTECTOMY     INTERCOSTAL NERVE BLOCK Right 10/30/2020   Procedure: INTERCOSTAL NERVE BLOCK;  Surgeon: Corliss Skains, MD;  Location: [facility] OR;  Service: Thoracic;  Laterality: Right;   VIDEO BRONCHOSCOPY WITH INSERTION OF INTERBRONCHIAL VALVE (IBV) N/A 11/28/2020   Procedure: VIDEO BRONCHOSCOPY WITH INSERTION OF INTERBRONCHIAL VALVE (IBV).;  Surgeon: Corliss Skains, MD;  Location: [facility] OR;  Service: Thoracic;  Laterality: N/A;   XI ROBOTIC ASSISTED THORACOSCOPY PLEURECTOMY Right 10/30/2020   Procedure: PLEURECTOMY;  Surgeon: Corliss Skains, MD;  Location: [facility] OR;  Service: Thoracic;  Laterality: Right;    Family History  Family history unknown: Yes     Social History   Tobacco Use  Smoking Status Every Day   Packs/day: 2.00   Types: Cigarettes  Smokeless Tobacco Never    Social History   Substance and Sexual Activity  Alcohol Use Not Currently   Comment: occassional     No Known Allergies  No current facility-administered medications for this encounter.    Review of Systems  Respiratory:  Positive for shortness of breath.   Psychiatric/Behavioral:   The patient is nervous/anxious.   All other systems reviewed and are negative.  PHYSICAL EXAMINATION: BP 122/77   Pulse 65   Temp 97.9 F (36.6 C) (Oral)   Resp 17   Ht 5\' 11"  (1.803 m)   Wt 74.8 kg   SpO2 100%   BMI 23.01 kg/m   Physical Exam Constitutional:      General: He is not in acute distress.    Appearance: Normal appearance.  HENT:     Head: Normocephalic and atraumatic.  Pulmonary:     Effort: Pulmonary effort is normal.  Abdominal:     General: There is no distension.  Neurological:     General: No focal deficit present.     Mental Status: He is alert and oriented to person, place, and time.     Diagnostic Studies & Laboratory data:     Recent Radiology Findings:   DG Chest 2 View  Result Date: 02/27/2021 CLINICAL DATA:  Air leak EXAM: CHEST - 2 VIEW COMPARISON:  Radiograph 02/09/2021, CT Nov 16, 2020 FINDINGS: Unchanged cardiomediastinal silhouette. There are multiple right-sided endobronchial valves unchanged in position. There is no focal airspace consolidation. There is no large pleural effusion or visible pneumothorax. Partially visualized cervical spine fusion hardware. No acute osseous abnormality. IMPRESSION: No evidence of acute cardiopulmonary disease. No visible  pneumothorax. Unchanged endobronchial valves. Electronically Signed   By: Caprice Renshaw M.D.   On: 02/27/2021 14:15   DG Chest 2 View  Result Date: 02/09/2021 CLINICAL DATA:  Pneumothorax. History of right lung surgery 10/30/2020 EXAM: CHEST - 2 VIEW COMPARISON:  12/08/2020 FINDINGS: Multiple endobronchial valves right upper lobe and right lower lobe bronchi unchanged. Negative for pneumothorax. Lungs clear without infiltrate or effusion. Heart size and vascularity normal. IMPRESSION: Lungs are clear.  Negative for pneumothorax. Electronically Signed   By: Marlan Palau M.D.   On: 02/09/2021 10:32       I have independently reviewed the above radiology studies  and reviewed the findings with  the patient.   Recent Lab Findings: Lab Results  Component Value Date   WBC 4.7 02/27/2021   HGB 15.3 02/27/2021   HCT 43.8 02/27/2021   PLT 194 02/27/2021   GLUCOSE 97 02/27/2021   CHOL 104 04/27/2020   TRIG 77 04/27/2020   HDL 34 (L) 04/27/2020   LDLCALC 55 04/27/2020   ALT 12 02/27/2021   AST 13 (L) 02/27/2021   NA 139 02/27/2021   K 4.0 02/27/2021   CL 108 02/27/2021   CREATININE 0.99 02/27/2021   BUN 22 (H) 02/27/2021   CO2 23 02/27/2021   TSH 1.717 11/07/2020   INR 1.1 02/27/2021   HGBA1C 5.5 04/27/2020         Assessment / Plan:   45yo male with endobronchial valves.   OR today for bronchoscopy, and endobronchial valve removal.      Franciso Dierks O Domnick Chervenak 03/01/2021 6:59 AM

## 2021-03-01 NOTE — Anesthesia Procedure Notes (Signed)
Procedure Name: Intubation Date/Time: 03/01/2021 7:36 AM Performed by: Marena Chancy, CRNA Pre-anesthesia Checklist: Patient identified, Emergency Drugs available, Suction available and Patient being monitored Patient Re-evaluated:Patient Re-evaluated prior to induction Oxygen Delivery Method: Circle System Utilized Preoxygenation: Pre-oxygenation with 100% oxygen Induction Type: IV induction Ventilation: Mask ventilation without difficulty Laryngoscope Size: Miller and 2 Grade View: Grade I Tube type: Oral Tube size: 8.5 mm Number of attempts: 1 Airway Equipment and Method: Stylet and Oral airway Placement Confirmation: ETT inserted through vocal cords under direct vision, positive ETCO2 and breath sounds checked- equal and bilateral Tube secured with: Tape Dental Injury: Teeth and Oropharynx as per pre-operative assessment

## 2021-03-01 NOTE — Discharge Summary (Signed)
Physician Discharge Summary   Patient ID: Ronald Butler [id number] 45 y.o. 1976/04/18  Admit date: 03/01/2021  Discharge date and time: No discharge date for patient encounter.   Admitting Physician: Corliss Skains, MD   Discharge Physician: Corliss Skains   Admission Diagnoses: IBV INSERTION DUE TO AIRLEAK  Discharge Diagnoses: s/p bronchoscopy  Admission Condition: stable  Discharged Condition: stable    Hospital Course: uncomplicated  Consults: None    Disposition: Discharge disposition: 01-Home or Self Care       Patient Instructions:  Allergies as of 03/01/2021   No Known Allergies      Medication List     TAKE these medications    albuterol 108 (90 Base) MCG/ACT inhaler Commonly known as: VENTOLIN HFA Inhale 2 puffs into the lungs 3 (three) times daily as needed for shortness of breath.   divalproex 250 MG DR tablet Commonly known as: DEPAKOTE Take 500 mg by mouth 2 (two) times daily.   folic acid 1 MG tablet Commonly known as: FOLVITE Take 1 mg by mouth at bedtime.   Lacosamide 100 MG Tabs Take 100 mg by mouth in the morning and at bedtime.   pantoprazole 20 MG tablet Commonly known as: PROTONIX Take 40 mg by mouth at bedtime.   zonisamide 100 MG capsule Commonly known as: ZONEGRAN Take 1 capsule (100 mg total) by mouth at bedtime.       Activity: activity as tolerated Diet: regular diet Wound Care: none needed  Follow-up with Dr. Cliffton Asters in 1 month.  SignedCorliss Skains 03/01/2021 8:56 AM

## 2021-03-01 NOTE — Transfer of Care (Signed)
Immediate Anesthesia Transfer of Care Note  Patient: Ronald Butler  Procedure(s) Performed: VIDEO BRONCHOSCOPY WITH REMOVAL OF INTERBRONCHIAL VALVE (IBV) X SIX VALVES  Patient Location: PACU  Anesthesia Type:General  Level of Consciousness: awake, alert  and oriented  Airway & Oxygen Therapy: Patient Spontanous Breathing and Patient connected to nasal cannula oxygen  Post-op Assessment: Report given to RN, Post -op Vital signs reviewed and stable and Patient moving all extremities X 4  Post vital signs: Reviewed and stable  Last Vitals:  Vitals Value Taken Time  BP 112/72 03/01/21 0842  Temp 36.1 C 03/01/21 0840  Pulse 79 03/01/21 0844  Resp 14 03/01/21 0844  SpO2 95 % 03/01/21 0844  Vitals shown include unvalidated device data.  Last Pain:  Vitals:   03/01/21 0622  TempSrc:   PainSc: 3          Complications: No notable events documented.

## 2021-03-01 NOTE — Anesthesia Preprocedure Evaluation (Signed)
Anesthesia Evaluation  Patient identified by MRN, date of birth, ID band Patient awake    Reviewed: Allergy & Precautions, NPO status , Patient's Chart, lab work & pertinent test results  History of Anesthesia Complications Negative for: history of anesthetic complications  Airway Mallampati: I  TM Distance: >3 FB Neck ROM: Full    Dental  (+) Dental Advisory Given, Teeth Intact   Pulmonary Current Smoker,    breath sounds clear to auscultation       Cardiovascular negative cardio ROS   Rhythm:Regular     Neuro/Psych  Headaches, Seizures -,  negative psych ROS   GI/Hepatic Neg liver ROS, GERD  Medicated and Controlled,  Endo/Other  negative endocrine ROS  Renal/GU negative Renal ROS     Musculoskeletal  (+) Arthritis ,   Abdominal   Peds  Hematology negative hematology ROS (+) Lab Results      Component                Value               Date                      WBC                      4.7                 02/27/2021                HGB                      15.3                02/27/2021                HCT                      43.8                02/27/2021                MCV                      94.4                02/27/2021                PLT                      194                 02/27/2021              Anesthesia Other Findings   Reproductive/Obstetrics                             Anesthesia Physical Anesthesia Plan  ASA: 2  Anesthesia Plan: General   Post-op Pain Management:    Induction: Intravenous  PONV Risk Score and Plan: 1 and Ondansetron and Dexamethasone  Airway Management Planned: Oral ETT  Additional Equipment: None  Intra-op Plan:   Post-operative Plan: Extubation in OR  Informed Consent: I have reviewed the patients History and Physical, chart, labs and discussed the procedure including the risks, benefits and alternatives for the proposed anesthesia  with the patient or authorized representative who has  indicated his/her understanding and acceptance.     Dental advisory given  Plan Discussed with: CRNA and Anesthesiologist  Anesthesia Plan Comments:         Anesthesia Quick Evaluation

## 2021-03-01 NOTE — Op Note (Signed)
      [address]       [name] [address]             [phone]        03/01/2021  Patient:  Ronald Butler Pre-Op Dx: History of endobronchial valve placement Post-op Dx: Same Procedure:   Surgeon and Role:      * Corliss Skains, MD - Primary  Anesthesia  general EBL: 0 ml Blood Administration: None   Indications: 45 year old male with a history of persistent right-sided pneumothorax.  He underwent endobronchial stent placement several months ago and presents today for removal. Findings: All stents were removed.  Operative Technique: All invasive lines were placed in pre-op holding.  After the risks, benefits and alternatives were thoroughly discussed, the patient was brought to the operative theatre.  Anesthesia was induced, and the patient was prepped and draped in normal sterile fashion.  An appropriate surgical pause was performed.  The bronchoscope was advanced and proceeded down the right mainstem bronchus.  We focused our attention to the right upper lobe rhonchi.  The valve was evident and subsequently removed.  We then moved to the bronchus intermedius.  There are also several stents were identified and removed sequentially.  At the completion there was no evidence of bleeding.  All apparent valves were removed.  There were a total of 6 valves that were removed.  The patient tolerated the procedure without any immediate complications, and was transferred to the PACU in good condition.  [name]

## 2021-03-02 NOTE — Anesthesia Postprocedure Evaluation (Signed)
Anesthesia Post Note  Patient: Ronald Butler  Procedure(s) Performed: VIDEO BRONCHOSCOPY WITH REMOVAL OF INTERBRONCHIAL VALVE (IBV) X SIX VALVES     Patient location during evaluation: PACU Anesthesia Type: General Level of consciousness: awake and alert Pain management: pain level controlled Vital Signs Assessment: post-procedure vital signs reviewed and stable Respiratory status: spontaneous breathing, nonlabored ventilation, respiratory function stable and patient connected to nasal cannula oxygen Cardiovascular status: blood pressure returned to baseline and stable Postop Assessment: no apparent nausea or vomiting Anesthetic complications: no   No notable events documented.  Last Vitals:  Vitals:   03/01/21 0940 03/01/21 0945  BP: 98/70 98/70  Pulse: 62 66  Resp: 12 16  Temp: (!) 36.1 C   SpO2: 97% 98%    Last Pain:  Vitals:   03/01/21 0940  TempSrc:   PainSc: Asleep                 Stephenia Vogan

## 2021-03-03 ENCOUNTER — Encounter (HOSPITAL_COMMUNITY): Payer: Self-pay | Admitting: Thoracic Surgery (Cardiothoracic Vascular Surgery)

## 2021-05-28 ENCOUNTER — Emergency Department (HOSPITAL_COMMUNITY)
Admission: EM | Admit: 2021-05-28 | Discharge: 2021-05-28 | Disposition: A | Discharge status: Home / Self Care | Attending: Emergency Medicine | Admitting: Emergency Medicine

## 2021-05-28 ENCOUNTER — Emergency Department (HOSPITAL_COMMUNITY)

## 2021-05-28 ENCOUNTER — Encounter (HOSPITAL_COMMUNITY): Payer: Self-pay

## 2021-05-28 DIAGNOSIS — Z8709 Personal history of other diseases of the respiratory system: Secondary | ICD-10-CM | POA: Insufficient documentation

## 2021-05-28 DIAGNOSIS — R519 Headache, unspecified: Secondary | ICD-10-CM | POA: Insufficient documentation

## 2021-05-28 DIAGNOSIS — R110 Nausea: Secondary | ICD-10-CM | POA: Insufficient documentation

## 2021-05-28 DIAGNOSIS — R569 Unspecified convulsions: Secondary | ICD-10-CM | POA: Insufficient documentation

## 2021-05-28 DIAGNOSIS — Z79899 Other long term (current) drug therapy: Secondary | ICD-10-CM | POA: Insufficient documentation

## 2021-05-28 DIAGNOSIS — F17210 Nicotine dependence, cigarettes, uncomplicated: Secondary | ICD-10-CM | POA: Insufficient documentation

## 2021-05-28 LAB — CBC WITH DIFFERENTIAL/PLATELET
Abs Immature Granulocytes: 0.02 10*3/uL (ref 0.00–0.07)
Basophils Absolute: 0 10*3/uL (ref 0.0–0.1)
Basophils Relative: 1 %
Eosinophils Absolute: 0.1 10*3/uL (ref 0.0–0.5)
Eosinophils Relative: 2 %
HCT: 40.8 % (ref 39.0–52.0)
Hemoglobin: 13.9 g/dL (ref 13.0–17.0)
Immature Granulocytes: 0 %
Lymphocytes Relative: 34 %
Lymphs Abs: 2 10*3/uL (ref 0.7–4.0)
MCH: 31.8 pg (ref 26.0–34.0)
MCHC: 34.1 g/dL (ref 30.0–36.0)
MCV: 93.4 fL (ref 80.0–100.0)
Monocytes Absolute: 0.6 10*3/uL (ref 0.1–1.0)
Monocytes Relative: 10 %
Neutro Abs: 3.3 10*3/uL (ref 1.7–7.7)
Neutrophils Relative %: 53 %
Platelets: 192 10*3/uL (ref 150–400)
RBC: 4.37 MIL/uL (ref 4.22–5.81)
RDW: 11.9 % (ref 11.5–15.5)
WBC: 6.1 10*3/uL (ref 4.0–10.5)
nRBC: 0 % (ref 0.0–0.2)

## 2021-05-28 LAB — COMPREHENSIVE METABOLIC PANEL
ALT: 16 U/L (ref 0–44)
AST: 16 U/L (ref 15–41)
Albumin: 4.2 g/dL (ref 3.5–5.0)
Alkaline Phosphatase: 58 U/L (ref 38–126)
Anion gap: 10 (ref 5–15)
BUN: 23 mg/dL — ABNORMAL HIGH (ref 6–20)
CO2: 22 mmol/L (ref 22–32)
Calcium: 9.4 mg/dL (ref 8.9–10.3)
Chloride: 107 mmol/L (ref 98–111)
Creatinine, Ser: 1.01 mg/dL (ref 0.61–1.24)
GFR, Estimated: >60 mL/min (ref >60)
Glucose, Bld: 89 mg/dL (ref 70–99)
Potassium: 3.7 mmol/L (ref 3.5–5.1)
Sodium: 139 mmol/L (ref 135–145)
Total Bilirubin: 0.6 mg/dL (ref 0.3–1.2)
Total Protein: 7.4 g/dL (ref 6.5–8.1)

## 2021-05-28 LAB — CBG MONITORING, ED: Glucose-Capillary: 90 mg/dL (ref 70–99)

## 2021-05-28 LAB — VALPROIC ACID LEVEL: Valproic Acid Lvl: 52 ug/mL (ref 50.0–100.0)

## 2021-05-28 MED ORDER — LACTATED RINGERS IV BOLUS
1000.0000 mL | Freq: Once | INTRAVENOUS | Status: AC
  Administered 2021-05-28: 12:00:00 1000 mL via INTRAVENOUS

## 2021-05-28 MED ORDER — METOCLOPRAMIDE HCL 5 MG/ML IJ SOLN
10.0000 mg | Freq: Once | INTRAMUSCULAR | Status: AC
  Administered 2021-05-28: 12:00:00 10 mg via INTRAVENOUS
  Filled 2021-05-28: qty 2

## 2021-05-28 MED ORDER — ACETAMINOPHEN 500 MG PO TABS
1000.0000 mg | ORAL_TABLET | Freq: Four times a day (QID) | ORAL | Status: DC | PRN
  Administered 2021-05-28: 06:00:00 1000 mg via ORAL
  Filled 2021-05-28: qty 2

## 2021-05-28 MED ORDER — DIPHENHYDRAMINE HCL 50 MG/ML IJ SOLN
25.0000 mg | Freq: Once | INTRAMUSCULAR | Status: AC
  Administered 2021-05-28: 12:00:00 25 mg via INTRAVENOUS
  Filled 2021-05-28: qty 1

## 2021-05-28 NOTE — ED Triage Notes (Signed)
Pt arrives via [employer] [facility] c/o headache, nausea, weakness for approximately two hours. Pt states he does not remember events leading up to headache, that he awoke to nurses in his cell attending to him. Pt A+Ox4, states he has hx of seizures.

## 2021-05-28 NOTE — ED Provider Notes (Signed)
MSE was initiated and I personally evaluated the patient and placed orders (if any) at  4:03 AM on May 28, 2021.  Incarcerated patient to ED with officers who report c/o HA, nausea, generalized weakness x 2 hours. Patient reports he isn't sure what happened, that when he woke up in his cell the nurses were working with him. He reports history of seizure  Today's Vitals   05/28/21 0326 05/28/21 0330  BP:  (!) 148/87  Pulse:  77  Resp:  12  Temp:  98 F (36.7 C)  TempSrc:  Oral  SpO2:  98%  Weight: 77.1 kg   Height: 5\' 10"  (1.778 m)   PainSc: 5     Body mass index is 24.39 kg/m.  Awake, alert, in NAD BS bilateral lungs (h/o PTX)  The patient appears stable so that the remainder of the MSE may be completed by another provider.   , PA-C 05/28/21 0406    05/30/21, MD 05/28/21 [phone]

## 2021-05-28 NOTE — Discharge Instructions (Signed)
You were evaluated in the Emergency Department and after careful evaluation, we did not find any emergent condition requiring admission or further testing in the hospital.  Your exam/testing today was overall reassuring.  Your CT head was negative.  Your laboratory work-up was reassuring.  You returned to your baseline mental status after your seizure.  Recommend continuing taking your home AEDs as prescribed. No driving or operating heavy machinery for 6 months.   Please return to the Emergency Department if you experience any worsening of your condition.  Thank you for allowing [place] to be a part of your care.

## 2021-05-28 NOTE — ED Provider Notes (Signed)
[facility] EMERGENCY DEPARTMENT Provider Note   CSN: [id number] Arrival date & time: 05/28/21  [id number]     History Chief Complaint  Patient presents with   Seizures   Loss of Consciousness    Ronald Butler is a 45 y.o. male.   Seizures Loss of Consciousness Associated symptoms: headaches and seizures   Associated symptoms: no chest pain, no fever, no palpitations, no shortness of breath and no vomiting    45 year old male with a known history of seizure disorders presenting to the emergency department with a witnessed seizure from jail.  Patient states that he woke up in his cell and the nurses were assisting him.  Staff state that the patient had generalized tonic-clonic shaking for an unspecified amount of time.  He was then transported to [facility] health for further care management.  He arrived no longer in a postictal state, endorsed mild frontal headache, nausea.  Denies any acute neck pain or stiffness, fever or chills.  Past Medical History:  Diagnosis Date   Arthritis    Dyspnea    GERD (gastroesophageal reflux disease)    Headache    Pancreatitis    Pneumothorax    Seizures ([place])     Patient Active Problem List   Diagnosis Date Noted   Seizures ([place]) 12/10/2020   Seizure ([place]) 12/08/2020   Pneumothorax 10/28/2020   Witnessed seizure-like activity ([place]) 04/26/2020   Recurrent spontaneous pneumothorax 04/24/2017    Past Surgical History:  Procedure Laterality Date   APPENDECTOMY     CHOLECYSTECTOMY     INTERCOSTAL NERVE BLOCK Right 10/30/2020   Procedure: INTERCOSTAL NERVE BLOCK;  Surgeon: Lajuana Matte, MD;  Location: [place];  Service: Thoracic;  Laterality: Right;   VIDEO BRONCHOSCOPY WITH INSERTION OF INTERBRONCHIAL VALVE (IBV) N/A 11/28/2020   Procedure: VIDEO BRONCHOSCOPY WITH INSERTION OF INTERBRONCHIAL VALVE (IBV).;  Surgeon: Lajuana Matte, MD;  Location: [facility];  Service: Thoracic;  Laterality: N/A;   VIDEO BRONCHOSCOPY  WITH INSERTION OF INTERBRONCHIAL VALVE (IBV) N/A 03/01/2021   Procedure: VIDEO BRONCHOSCOPY WITH REMOVAL OF INTERBRONCHIAL VALVE (IBV) X SIX VALVES;  Surgeon: Lajuana Matte, MD;  Location: [place];  Service: Thoracic;  Laterality: N/A;   XI ROBOTIC ASSISTED THORACOSCOPY PLEURECTOMY Right 10/30/2020   Procedure: PLEURECTOMY;  Surgeon: Lajuana Matte, MD;  Location: [facility] OR;  Service: Thoracic;  Laterality: Right;       Family History  Family history unknown: Yes    Social History   Tobacco Use   Smoking status: Every Day    Packs/day: 2.00    Types: Cigarettes   Smokeless tobacco: Never  Vaping Use   Vaping Use: Never used  Substance Use Topics   Alcohol use: Not Currently    Comment: occassional   Drug use: Not Currently    Types: Marijuana    Home Medications Prior to Admission medications   Medication Sig Start Date End Date Taking? Authorizing Provider  divalproex (DEPAKOTE) 500 MG DR tablet Take 1,000 mg by mouth at bedtime.   Yes [provider]  folic acid (FOLVITE) 1 MG tablet Take 1 mg by mouth at bedtime.   Yes [provider]  pantoprazole (PROTONIX) 20 MG tablet Take 20 mg by mouth at bedtime.   Yes [provider]  zonisamide (ZONEGRAN) 100 MG capsule Take 1 capsule (100 mg total) by mouth at bedtime. 12/15/20  Yes Lavina Hamman, MD    Allergies    Patient has no known allergies.  Review of Systems   Review of Systems  Constitutional:  Negative for chills and fever.  HENT:  Negative for ear pain and sore throat.   Eyes:  Negative for pain and visual disturbance.  Respiratory:  Negative for cough and shortness of breath.   Cardiovascular:  Positive for syncope. Negative for chest pain and palpitations.  Gastrointestinal:  Negative for abdominal pain and vomiting.  Genitourinary:  Negative for dysuria and hematuria.  Musculoskeletal:  Negative for arthralgias and back pain.  Skin:  Negative for color change and rash.   Neurological:  Positive for seizures and headaches. Negative for syncope.  All other systems reviewed and are negative.  Physical Exam Updated Vital Signs BP 101/77   Pulse 69   Temp 97.8 F (36.6 C) (Oral)   Resp 16   Ht 5\' 10"  (1.778 m)   Wt 77.1 kg   SpO2 100%   BMI 24.39 kg/m   Physical Exam Vitals and nursing note reviewed.  Constitutional:      General: He is not in acute distress.    Appearance: He is well-developed.  HENT:     Head: Normocephalic and atraumatic.  Eyes:     Conjunctiva/sclera: Conjunctivae normal.  Neck:     Comments: No meningismus Cardiovascular:     Rate and Rhythm: Normal rate and regular rhythm.     Heart sounds: No murmur heard. Pulmonary:     Effort: Pulmonary effort is normal. No respiratory distress.     Breath sounds: Normal breath sounds.  Abdominal:     Palpations: Abdomen is soft.     Tenderness: There is no abdominal tenderness.  Musculoskeletal:        General: No swelling.     Cervical back: Neck supple.  Skin:    General: Skin is warm and dry.     Capillary Refill: Capillary refill takes less than 2 seconds.  Neurological:     Mental Status: He is alert.     Comments: MENTAL STATUS EXAM:    Orientation: Alert and oriented to person, place and time.  Memory: Cooperative, follows commands well.  Language: Speech is clear and language is normal.   CRANIAL NERVES:    CN 2 (Optic): Visual fields intact to confrontation.  CN 3,4,6 (EOM): Pupils equal and reactive to light. Full extraocular eye movement without nystagmus.  CN 5 (Trigeminal): Facial sensation is normal, no weakness of masticatory muscles.  CN 7 (Facial): No facial weakness or asymmetry.  CN 8 (Auditory): Auditory acuity grossly normal.  CN 9,10 (Glossophar): The uvula is midline, the palate elevates symmetrically.  CN 11 (spinal access): Normal sternocleidomastoid and trapezius strength.  CN 12 (Hypoglossal): The tongue is midline. No atrophy or  fasciculations.   MOTOR:  Muscle Strength: 5/5RUE, 5/5LUE, 5/5RLE, 5/5LLE.   COORDINATION:   Intact finger-to-nose, no tremor.   SENSATION:   Intact to light touch all four extremities.  GAIT: Gait normal without ataxia   Psychiatric:        Mood and Affect: Mood normal.    ED Results / Procedures / Treatments   Labs (all labs ordered are listed, but only abnormal results are displayed) Labs Reviewed  COMPREHENSIVE METABOLIC PANEL - Abnormal; Notable for the following components:      Result Value   BUN 23 (*)    All other components within normal limits  CBC WITH DIFFERENTIAL/PLATELET  VALPROIC ACID LEVEL  CBG MONITORING, ED    EKG None  Radiology DG Chest 1 View  Result Date: 05/28/2021 CLINICAL DATA:  History of pneumothorax EXAM: CHEST  1 VIEW COMPARISON:  02/27/2021 FINDINGS: Intrabronchial valve on the right. No visible pneumothorax or pleural effusion. Atelectasis at the left lung base. Postoperative right perihilar lung. Normal heart size and mediastinal contours. IMPRESSION: 1. No acute finding.  No visible pneumothorax. 2. Right lung surgery and interbronchial valve. Electronically Signed   By: Jorje Guild M.D.   On: 05/28/2021 04:42   CT HEAD WO CONTRAST (5MM)  Result Date: 05/28/2021 CLINICAL DATA:  Seizure EXAM: CT HEAD WITHOUT CONTRAST TECHNIQUE: Contiguous axial images were obtained from the base of the skull through the vertex without intravenous contrast. COMPARISON:  None. FINDINGS: Brain: No evidence of acute infarction, hemorrhage, hydrocephalus, extra-axial collection or mass lesion/mass effect. Vascular: No hyperdense vessel or unexpected calcification. Skull: Normal. Negative for fracture or focal lesion. Sinuses/Orbits: No acute finding. Other: None. IMPRESSION: No acute intracranial abnormality. Electronically Signed   By: Yetta Glassman M.D.   On: 05/28/2021 13:35    Procedures Procedures   Medications Ordered in ED Medications   metoCLOPramide (REGLAN) injection 10 mg (10 mg Intravenous Given 05/28/21 1145)  diphenhydrAMINE (BENADRYL) injection 25 mg (25 mg Intravenous Given 05/28/21 1145)  lactated ringers bolus 1,000 mL (0 mLs Intravenous Stopped 05/28/21 1255)    ED Course  I have reviewed the triage vital signs and the nursing notes.  Pertinent labs & imaging results that were available during my care of the patient were reviewed by me and considered in my medical decision making (see chart for details).    MDM Rules/Calculators/A&P                           45 year old male with a known history of seizure disorders presenting to the emergency department with a witnessed seizure from jail.  Patient states that he woke up in his cell and the nurses were assisting him.  Staff state that the patient had generalized tonic-clonic shaking for an unspecified amount of time.  He was then transported to [facility] health for further care management.  He arrived no longer in a postictal state, endorsed mild frontal headache, nausea.  Denies any acute neck pain or stiffness, fever or chills.  Patient presents after a single witnessed generalized tonic-clonic seizure from jail.  Currently back to his neurologic baseline with a normal neurologic exam.  Unclear if the patient struck his head.  Endorsing mild headache.  CT head performed without acute intracranial abnormality.  Chest x-Zorian performed from triage negative.  CBG on arrival 98, CMP generally unremarkable, CBC without leukocytosis, anemia or platelet abnormality.  Valproate level collected and therapeutic.  Patient's headache was treated with an LR bolus, IV Reglan and Benadryl with subsequent resolution.  No further seizure activity noted in the emergency department.  Patient advised to continue taking his AEDs as prescribed by providers at the jail, advised avoidance of heavy machinery or driving for the next 6 months.  Overall stable for discharge.  Final Clinical  Impression(s) / ED Diagnoses Final diagnoses:  History of pneumothorax  Seizure [facility])    Rx / DC Orders ED Discharge Orders     None        Regan Lemming, MD 05/28/21 2143

## 2021-12-03 IMAGING — CT CT CHEST W/O CM
2 of 4 series · 15 of 36 positions shown, 18 images · non-contrast
Comparison: None.

CLINICAL DATA: Pneumothorax with persistent air leak. Evaluate for
blebs.

EXAM:
CT CHEST WITHOUT CONTRAST
TECHNIQUE: Multidetector CT imaging of the chest was performed following the
standard protocol without IV contrast.

[Series 3: lungs · axial · 0.67mm/px · z∈[+1292,+1556]mm · 12 of 157 slices shown, 15 images]
[im 13/157  mediastinal]
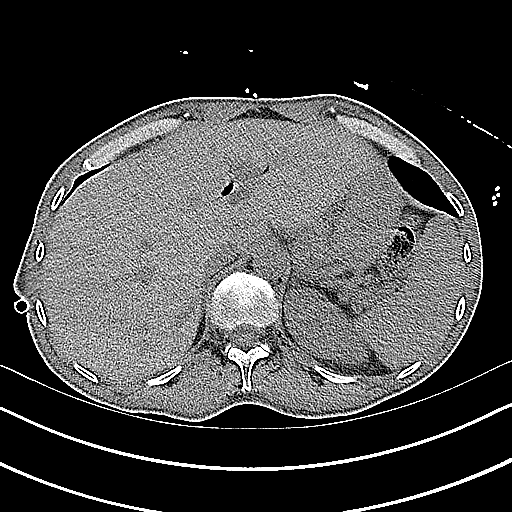
[im 13/157  lung]
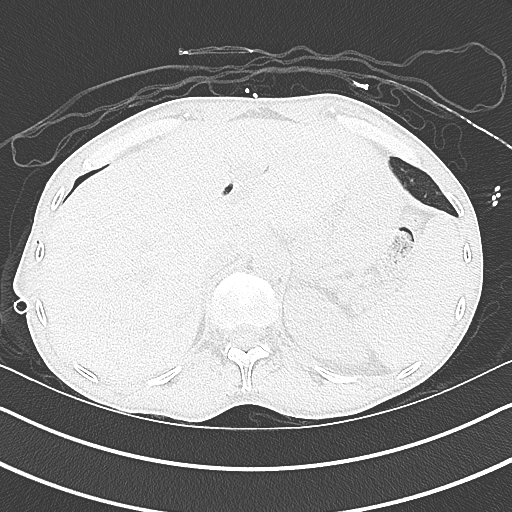
[im 25/157  lung]
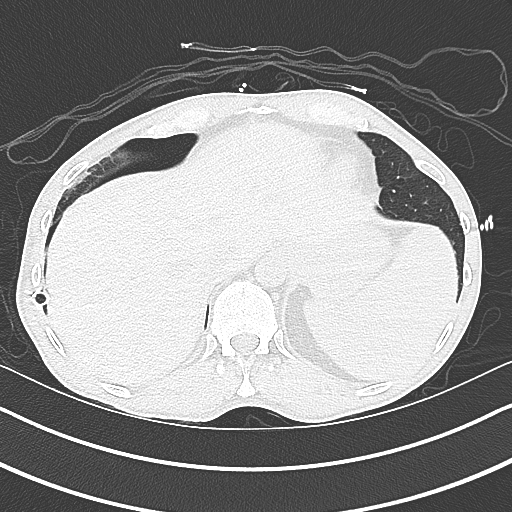
[im 37/157  lung]
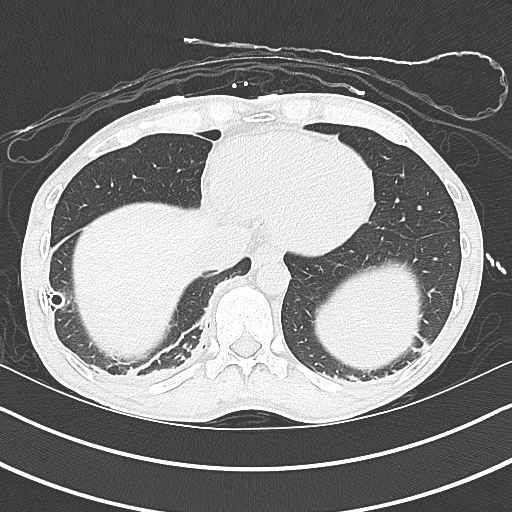
[im 49/157  lung]
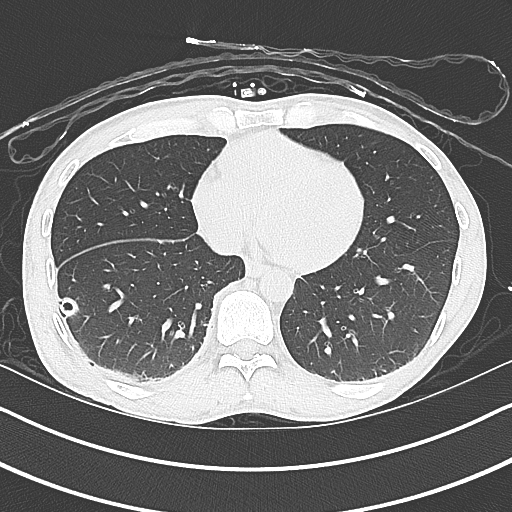
[im 61/157  mediastinal]
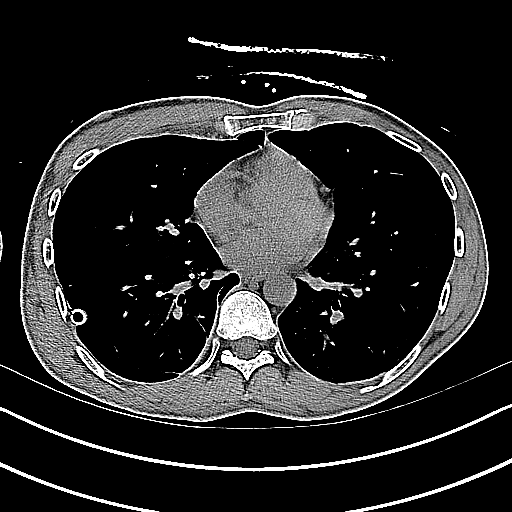
[im 61/157  lung]
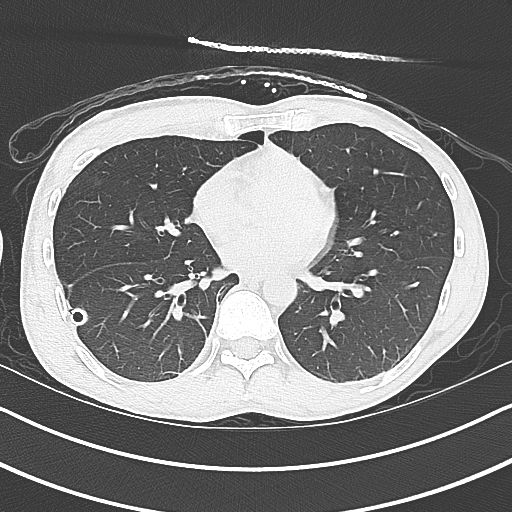
[im 73/157  lung]
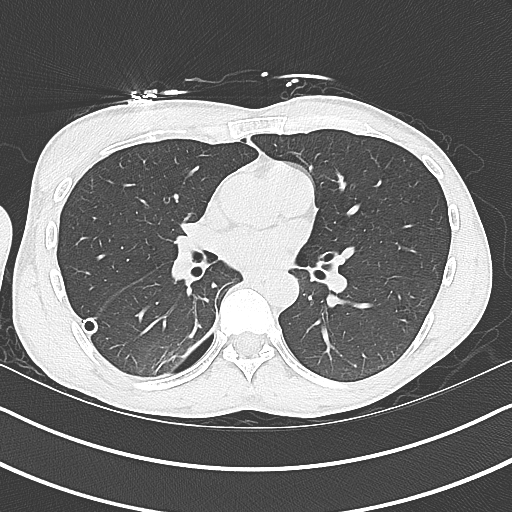
[im 85/157  lung]
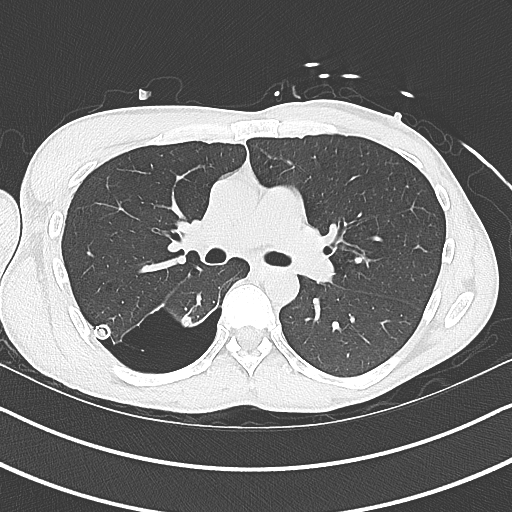
[im 97/157  lung]
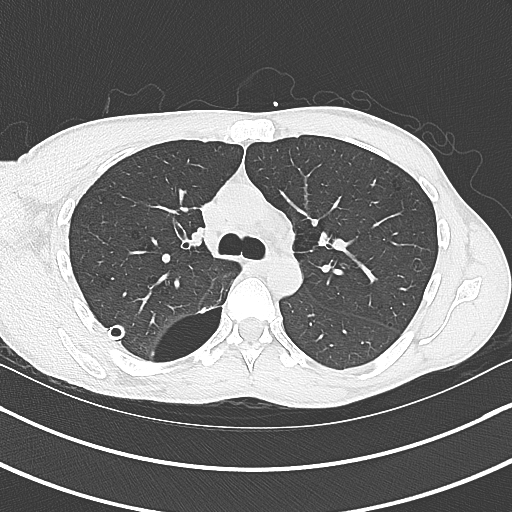
[im 109/157  mediastinal]
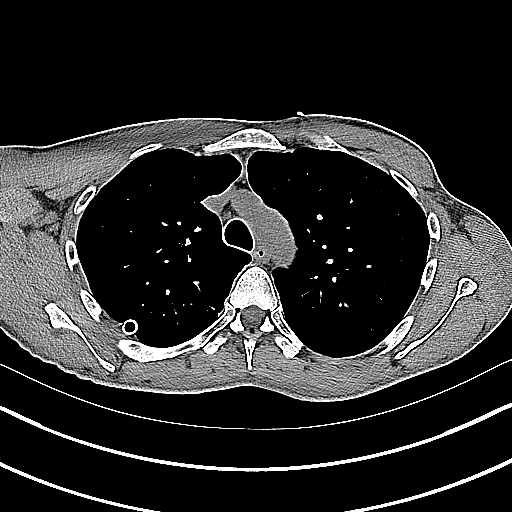
[im 109/157  lung]
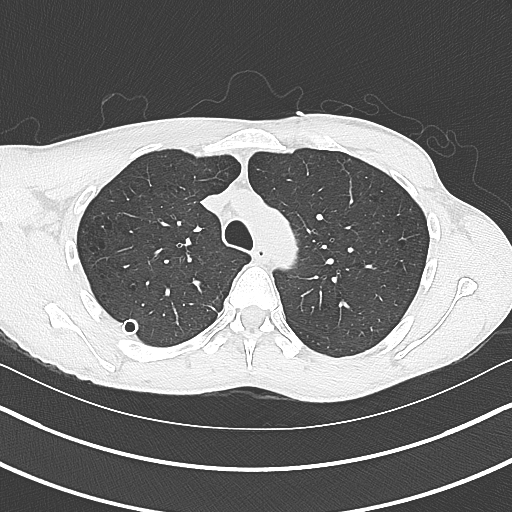
[im 121/157  lung]
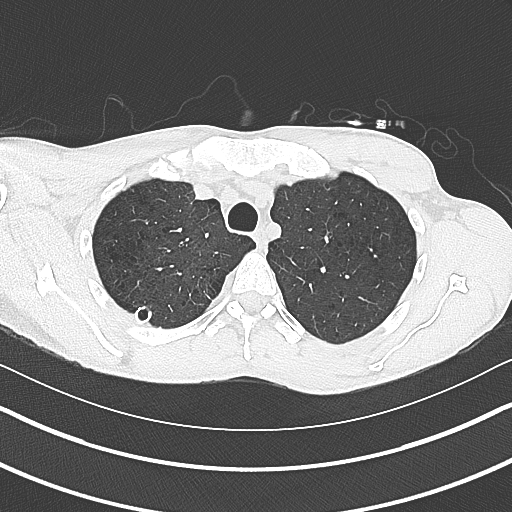
[im 133/157  lung]
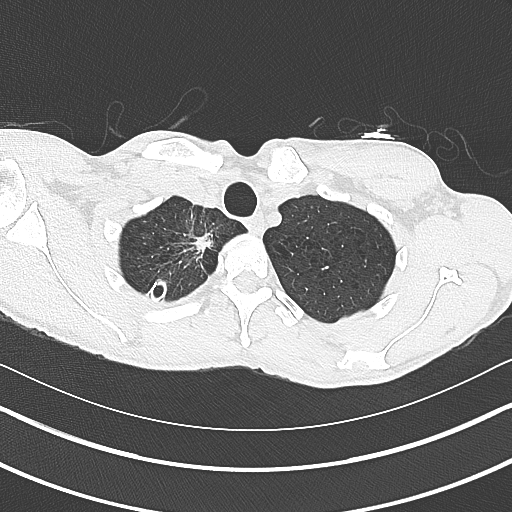
[im 145/157  lung]
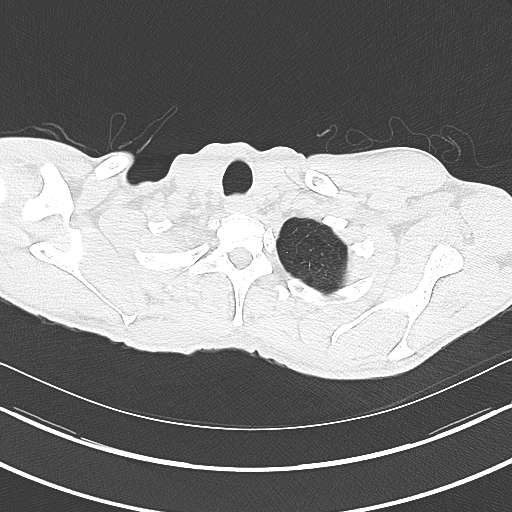

[Series 6: cor · coronal · 0.62mm/px · 3 of 120 slices shown]
[im 24/120  lung]
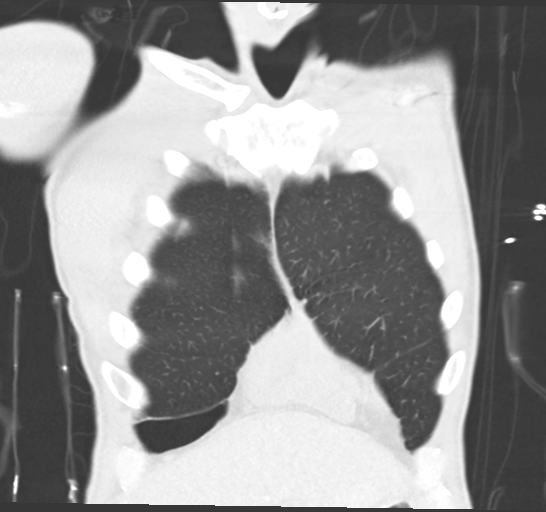
[im 48/120  lung]
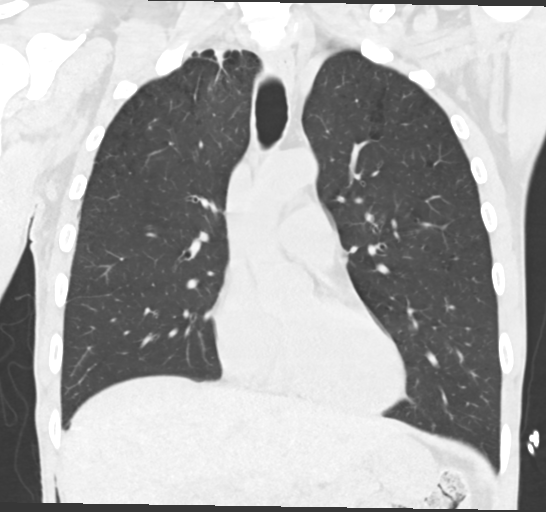
[im 72/120  lung]
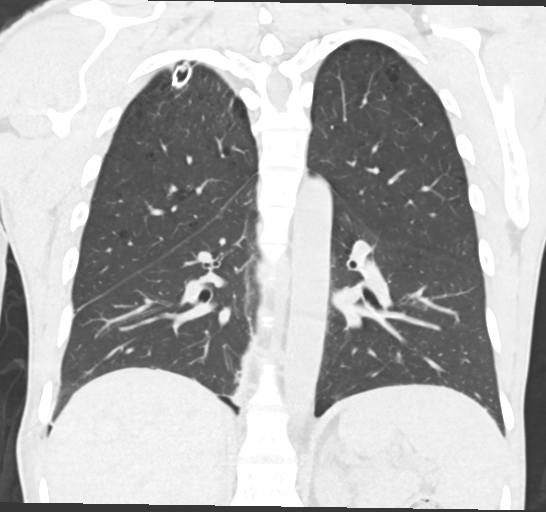

[15 of 36 positions shown; findings below may reference images not displayed]

FINDINGS: Cardiovascular: Limited evaluation in the absence of intravenous
contrast. No evidence of aneurysm. Heart is normal in size. No
pericardial effusion.

Mediastinum/Nodes: Unremarkable CT appearance of the thyroid gland.
No suspicious mediastinal or hilar adenopathy. No soft tissue
mediastinal mass. The thoracic esophagus is unremarkable.

Lungs/Pleura: Surgical changes of prior blebectomy at the right lung
apex and superior segment of the right lower lobe. There are a few
small blebs along the anterior margin of the right lung apex.
Centrilobular pulmonary emphysema is also present. Small right
basilar pneumothorax. Incomplete re-expansion of the superior
segment of the right lower lobe. Thoracostomy tube in place entering
via the right 9-10 rib interspace.

Upper Abdomen: No acute abnormality. Incompletely imaged pneumobilia
presumably related to prior sphincterotomy.

Musculoskeletal: No acute fracture or aggressive appearing lytic or
blastic osseous lesion.
IMPRESSION: 1. Small residual right basilar pneumothorax also with incomplete
re-expansion of the superior segment of the right lower lobe despite
well-positioned thoracostomy tube.
2. Evidence of prior surgical resection of pulmonary blebs at the
right lung apex and superior segment of the right lower lobe.
3. Three small blebs present along the anterior aspect of the right
lung apex.
4. Mild centrilobular pulmonary emphysema.

Emphysema ([id number]).

## 2021-12-04 IMAGING — DX DG CHEST 1V PORT
1 series · 1 of 1 positions shown · non-contrast
Comparison: November 17, 2020 study obtained earlier in the day

CLINICAL DATA: Pneumothorax

EXAM:
PORTABLE CHEST 1 VIEW

[chest]
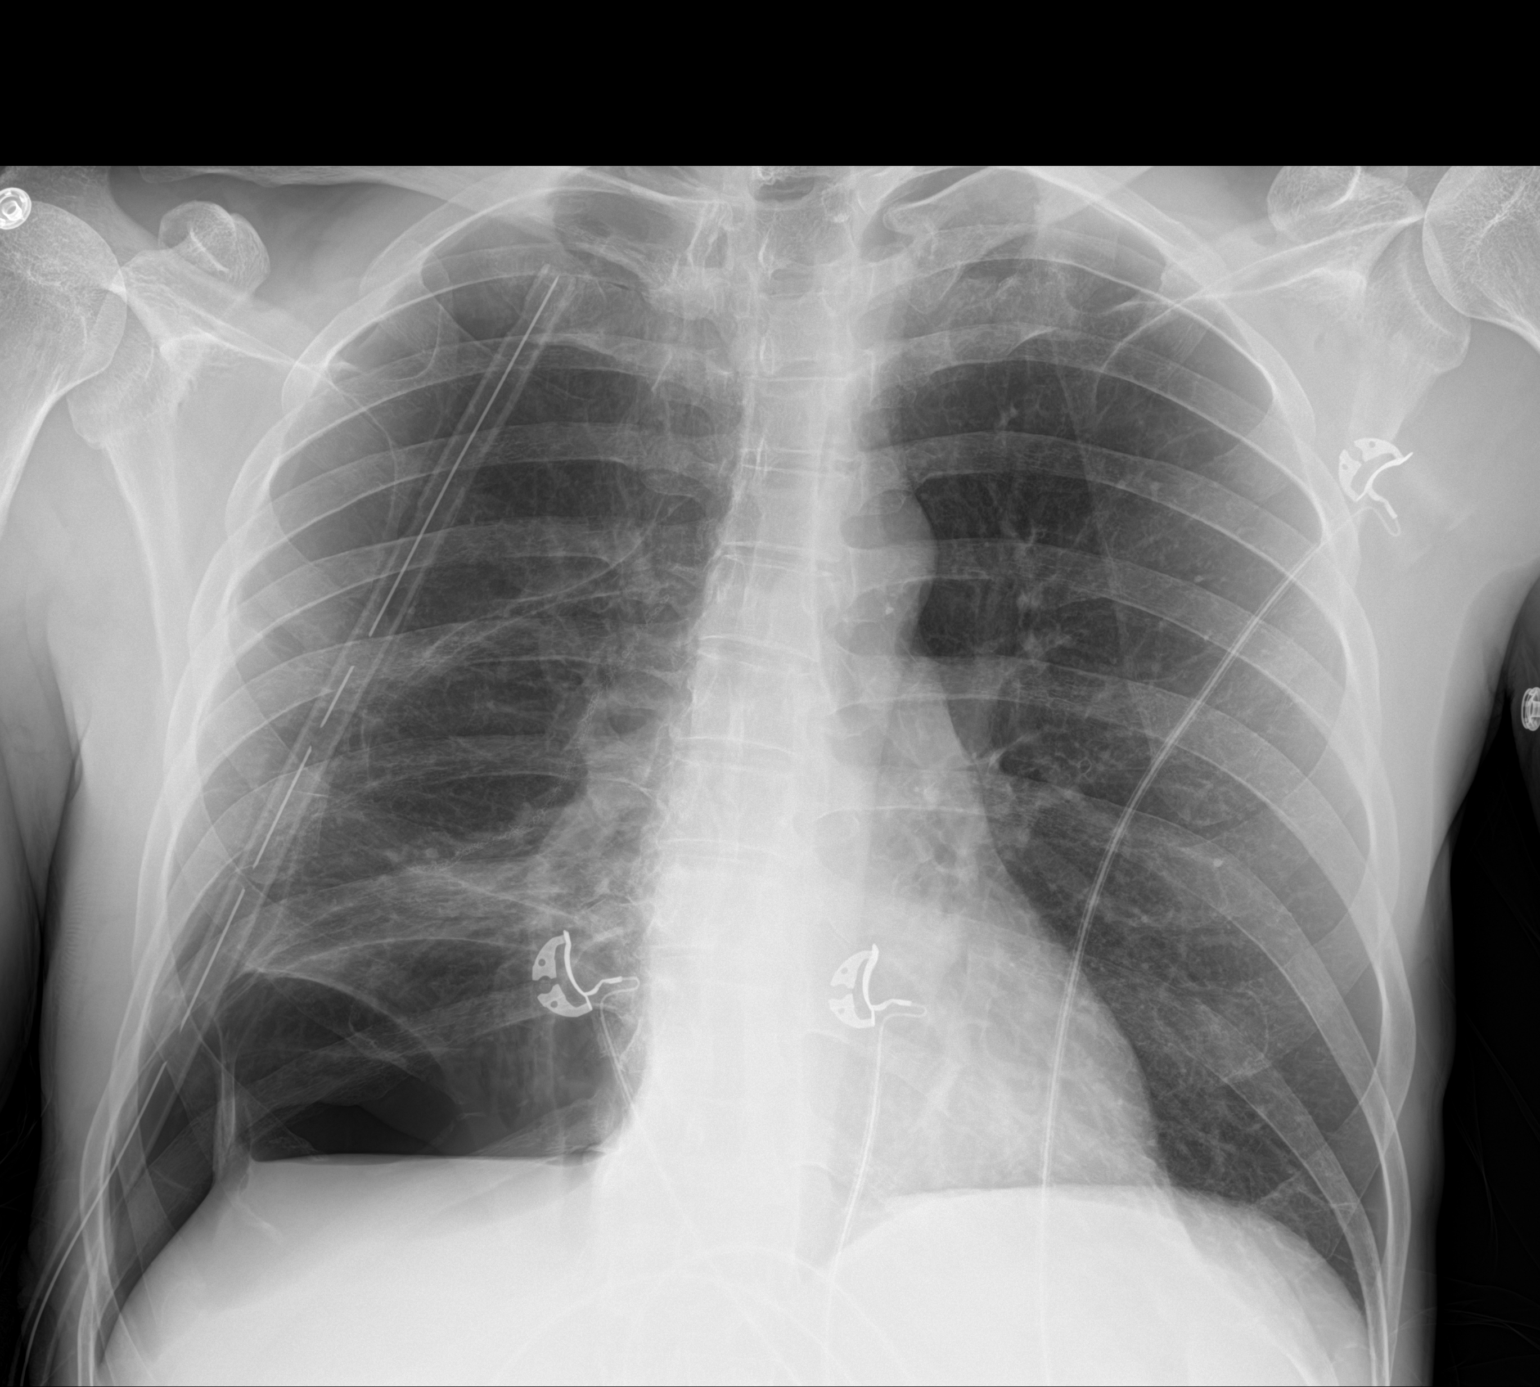

[1 of 1 positions shown; findings below may reference images not displayed]

FINDINGS: Chest tube position unchanged. There has been interval enlargement
of a right base right base pneumothorax without tension component.
There is atelectatic change in the right base. Lungs elsewhere
clear. Heart size and pulmonary vascularity are normal. No
adenopathy. No bone lesions. Right base pneumothorax now present
without tension component.
IMPRESSION: Interval enlargement of right base pneumothorax without tension
component. Chest tube position unchanged. Right base atelectasis.
Lungs elsewhere clear. Stable cardiac silhouette.

Critical Value/emergent results were called by telephone at the time
of interpretation on 11/17/2020 at [DATE] to provider Kaki Jim,
RN, who verbally acknowledged these results. She informed me that
the provider was aware of the findings on this radiograph and had
made appropriate adjustments to the chest tube.

## 2021-12-04 IMAGING — DX DG CHEST 1V PORT
1 series · 1 of 1 positions shown · non-contrast
Comparison: Chest radiograph and chest CT November 16, 2020

CLINICAL DATA: Chest pain.  Chest tube in place

EXAM:
PORTABLE CHEST 1 VIEW

[chest ap]
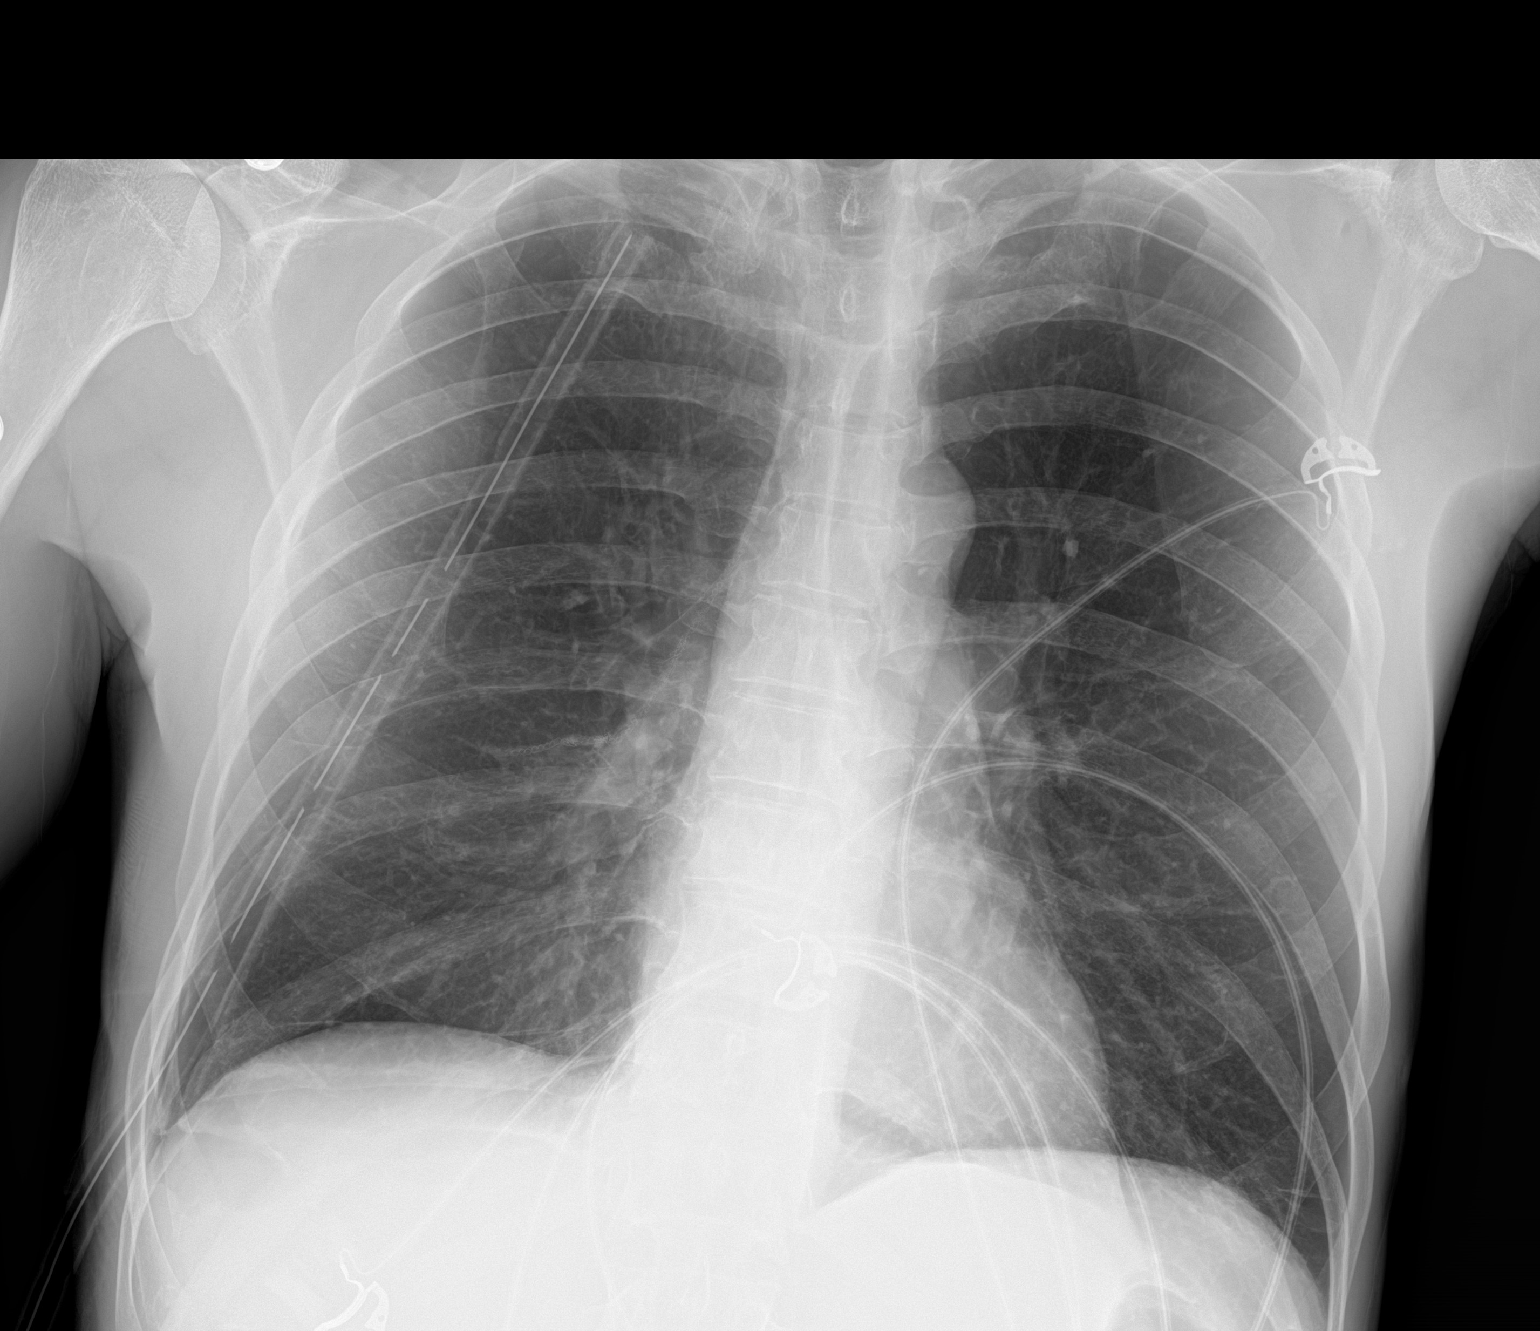

[1 of 1 positions shown; findings below may reference images not displayed]

FINDINGS: Chest tube remains on the right. Right basilar pneumothorax again
noted, better seen by CT. The focal area of presumed scarring in the
right apex is less well seen by radiography than CT. No edema or
airspace opacity. Heart size and pulmonary vascularity normal. No
adenopathy. Postoperative change lower cervical spine.
IMPRESSION: Chest tube unchanged in position on the right with persistent right
basilar pneumothorax. No tension component. Scarring right apex
better seen on CT. No edema or airspace opacity. Heart size normal

## 2021-12-05 IMAGING — DX DG CHEST 1V PORT
1 series · 1 of 1 positions shown · non-contrast
Comparison: November 17, 2020

CLINICAL DATA: Evaluate pneumothorax

EXAM:
PORTABLE CHEST 1 VIEW

[chest ap]
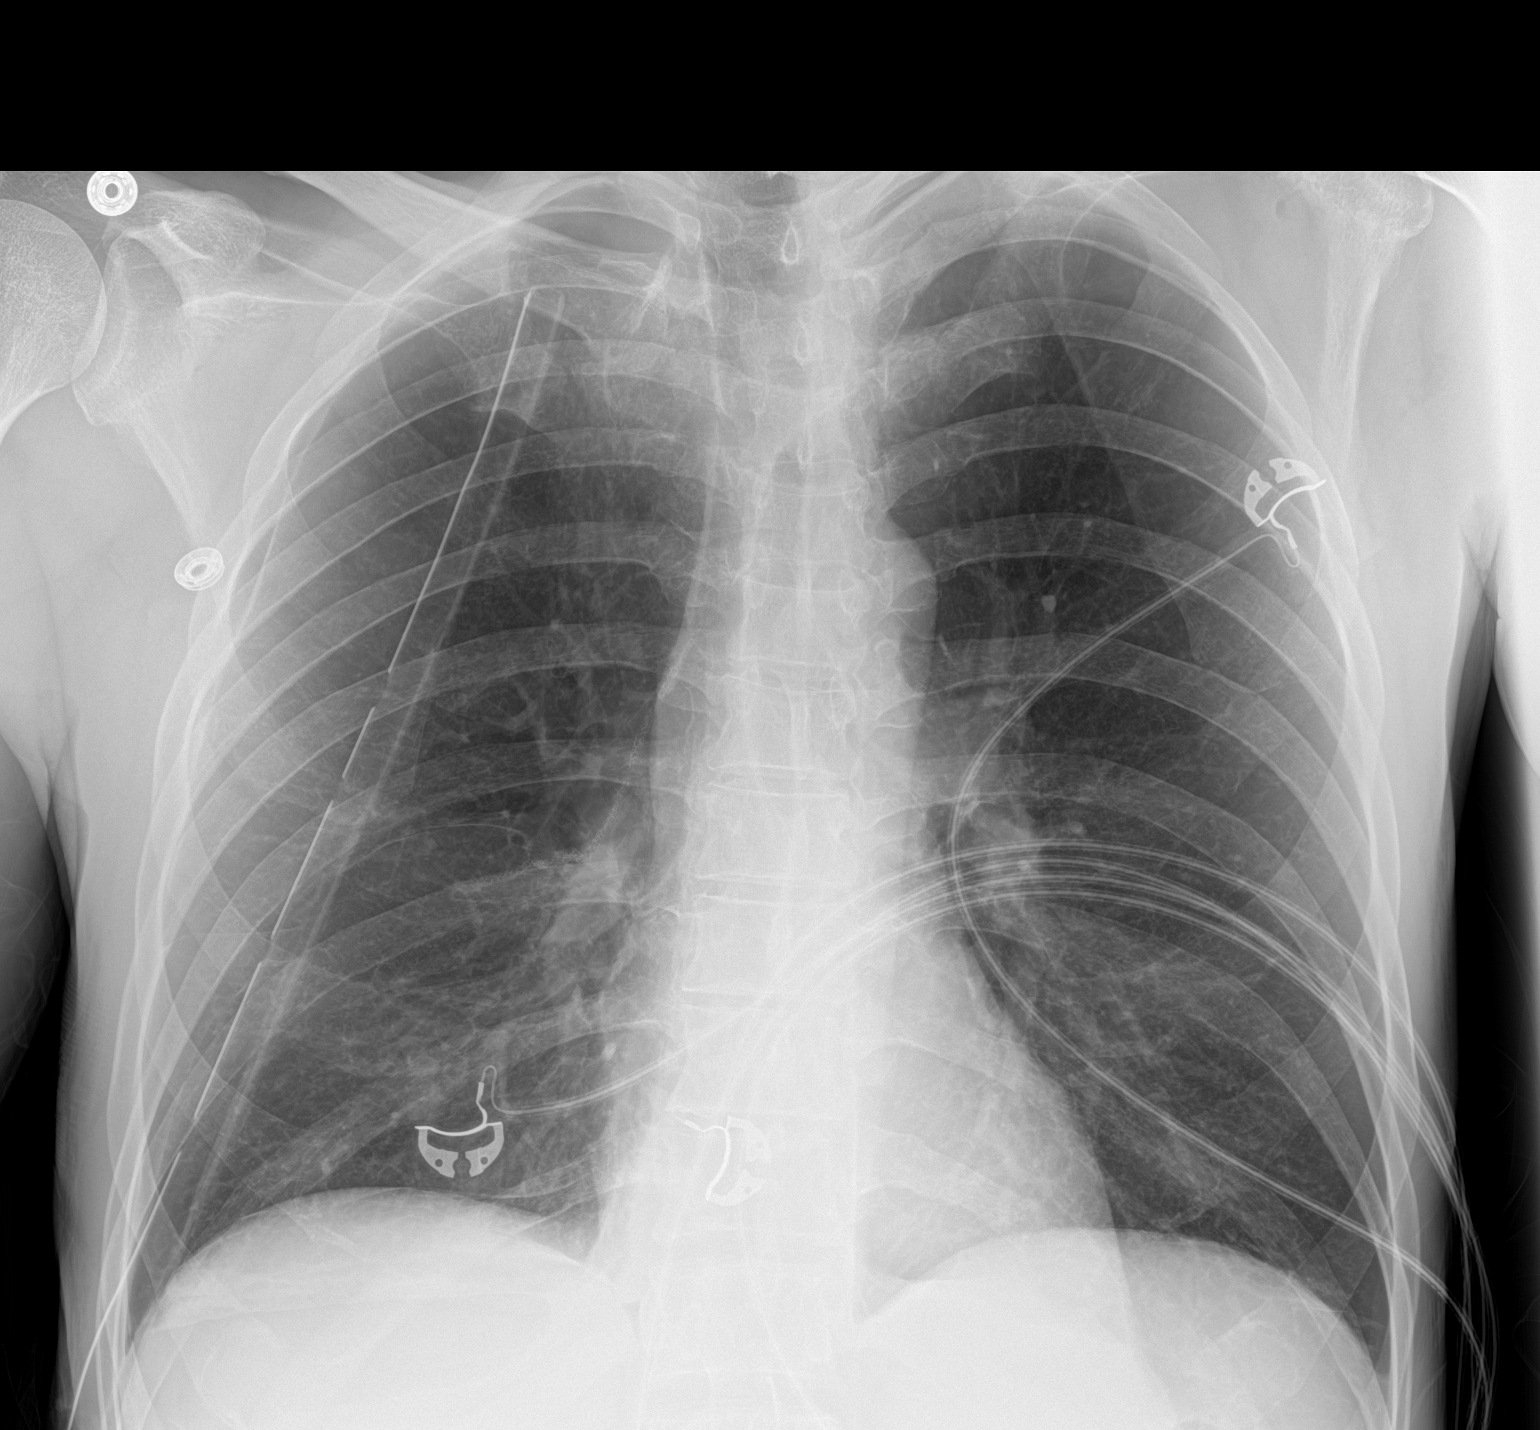

[1 of 1 positions shown; findings below may reference images not displayed]

FINDINGS: The basilar right pneumothorax is improved. There is now a small
right lateral component measuring 6 mm, not seen on the previous
study. The right chest tube remains in place. No left-sided
pneumothorax. The cardiomediastinal silhouette is stable. No other
acute abnormalities.
IMPRESSION: The right chest tube remains in good position. Overall, the
right-sided pneumothorax is smaller in the interval. Only a small
lateral component measuring 6 mm is identified.
